# Patient Record
Sex: Female | Born: 1965 | Race: Black or African American | Hispanic: No | Smoking: Former smoker
Health system: Southern US, Community
[De-identification: ages and names within clinical notes are randomized; demographics above are authoritative.]

## PROBLEM LIST (undated history)

## (undated) DIAGNOSIS — D649 Anemia, unspecified: Secondary | ICD-10-CM

## (undated) DIAGNOSIS — I10 Essential (primary) hypertension: Secondary | ICD-10-CM

## (undated) DIAGNOSIS — M1990 Unspecified osteoarthritis, unspecified site: Secondary | ICD-10-CM

## (undated) DIAGNOSIS — R7611 Nonspecific reaction to tuberculin skin test without active tuberculosis: Secondary | ICD-10-CM

## (undated) DIAGNOSIS — R011 Cardiac murmur, unspecified: Secondary | ICD-10-CM

## (undated) DIAGNOSIS — T7840XA Allergy, unspecified, initial encounter: Secondary | ICD-10-CM

## (undated) DIAGNOSIS — F17200 Nicotine dependence, unspecified, uncomplicated: Secondary | ICD-10-CM

## (undated) DIAGNOSIS — B029 Zoster without complications: Secondary | ICD-10-CM

## (undated) DIAGNOSIS — K219 Gastro-esophageal reflux disease without esophagitis: Secondary | ICD-10-CM

## (undated) HISTORY — DX: Anemia, unspecified: D64.9

## (undated) HISTORY — DX: Gastro-esophageal reflux disease without esophagitis: K21.9

## (undated) HISTORY — PX: UPPER GASTROINTESTINAL ENDOSCOPY: SHX188

## (undated) HISTORY — PX: OTHER SURGICAL HISTORY: SHX169

## (undated) HISTORY — PX: KNEE SURGERY: SHX244

## (undated) HISTORY — PX: ABDOMINAL HYSTERECTOMY: SHX81

## (undated) HISTORY — PX: TUBAL LIGATION: SHX77

## (undated) HISTORY — DX: Nicotine dependence, unspecified, uncomplicated: F17.200

## (undated) HISTORY — DX: Allergy, unspecified, initial encounter: T78.40XA

## (undated) HISTORY — DX: Cardiac murmur, unspecified: R01.1

## (undated) HISTORY — PX: PANENDOSCOPY: SHX2159

## (undated) HISTORY — PX: BUNIONECTOMY: SHX129

## (undated) HISTORY — DX: Essential (primary) hypertension: I10

## (undated) HISTORY — DX: Unspecified osteoarthritis, unspecified site: M19.90

## (undated) HISTORY — PX: BREAST SURGERY: SHX581

## (undated) HISTORY — PX: TONSILLECTOMY: SUR1361

## (undated) HISTORY — DX: Nonspecific reaction to tuberculin skin test without active tuberculosis: R76.11

## (undated) HISTORY — PX: CYSTECTOMY: SUR359

## (undated) HISTORY — PX: CHOLECYSTECTOMY: SHX55

---

## 1999-01-29 ENCOUNTER — Ambulatory Visit (HOSPITAL_BASED_OUTPATIENT_CLINIC_OR_DEPARTMENT_OTHER): Admission: RE | Admit: 1999-01-29 | Discharge: 1999-01-29 | Payer: Self-pay | Admitting: Orthopedic Surgery

## 1999-09-05 ENCOUNTER — Ambulatory Visit (HOSPITAL_COMMUNITY): Admission: RE | Admit: 1999-09-05 | Discharge: 1999-09-05 | Payer: Self-pay | Admitting: Internal Medicine

## 1999-09-05 ENCOUNTER — Encounter: Payer: Self-pay | Admitting: Internal Medicine

## 1999-09-06 ENCOUNTER — Encounter: Payer: Self-pay | Admitting: General Surgery

## 1999-09-06 ENCOUNTER — Encounter: Admission: RE | Admit: 1999-09-06 | Discharge: 1999-09-06 | Payer: Self-pay | Admitting: General Surgery

## 1999-09-13 ENCOUNTER — Observation Stay (HOSPITAL_COMMUNITY): Admission: RE | Admit: 1999-09-13 | Discharge: 1999-09-14 | Payer: Self-pay | Admitting: General Surgery

## 1999-09-13 ENCOUNTER — Encounter (INDEPENDENT_AMBULATORY_CARE_PROVIDER_SITE_OTHER): Payer: Self-pay | Admitting: Specialist

## 1999-10-12 ENCOUNTER — Encounter: Payer: Self-pay | Admitting: General Surgery

## 1999-10-12 ENCOUNTER — Ambulatory Visit (HOSPITAL_COMMUNITY): Admission: RE | Admit: 1999-10-12 | Discharge: 1999-10-12 | Payer: Self-pay | Admitting: General Surgery

## 1999-11-21 ENCOUNTER — Encounter: Payer: Self-pay | Admitting: Obstetrics & Gynecology

## 1999-11-21 ENCOUNTER — Encounter: Admission: RE | Admit: 1999-11-21 | Discharge: 1999-11-21 | Payer: Self-pay | Admitting: Obstetrics & Gynecology

## 1999-11-28 ENCOUNTER — Encounter (INDEPENDENT_AMBULATORY_CARE_PROVIDER_SITE_OTHER): Payer: Self-pay

## 1999-11-28 ENCOUNTER — Ambulatory Visit (HOSPITAL_COMMUNITY): Admission: RE | Admit: 1999-11-28 | Discharge: 1999-11-28 | Payer: Self-pay | Admitting: Obstetrics & Gynecology

## 2001-10-16 ENCOUNTER — Encounter: Admission: RE | Admit: 2001-10-16 | Discharge: 2001-10-16 | Payer: Self-pay | Admitting: *Deleted

## 2001-12-02 ENCOUNTER — Inpatient Hospital Stay (HOSPITAL_COMMUNITY): Admission: AD | Admit: 2001-12-02 | Discharge: 2001-12-02 | Payer: Self-pay | Admitting: Obstetrics and Gynecology

## 2001-12-10 ENCOUNTER — Encounter: Admission: RE | Admit: 2001-12-10 | Discharge: 2001-12-10 | Payer: Self-pay | Admitting: *Deleted

## 2002-04-19 ENCOUNTER — Other Ambulatory Visit: Admission: RE | Admit: 2002-04-19 | Discharge: 2002-04-19 | Payer: Self-pay | Admitting: Internal Medicine

## 2002-05-11 ENCOUNTER — Ambulatory Visit (HOSPITAL_COMMUNITY): Admission: RE | Admit: 2002-05-11 | Discharge: 2002-05-11 | Payer: Self-pay | Admitting: Obstetrics & Gynecology

## 2002-06-23 ENCOUNTER — Encounter (INDEPENDENT_AMBULATORY_CARE_PROVIDER_SITE_OTHER): Payer: Self-pay | Admitting: Specialist

## 2002-06-24 ENCOUNTER — Inpatient Hospital Stay (HOSPITAL_COMMUNITY): Admission: RE | Admit: 2002-06-24 | Discharge: 2002-06-25 | Payer: Self-pay | Admitting: Obstetrics & Gynecology

## 2002-09-21 ENCOUNTER — Ambulatory Visit (HOSPITAL_COMMUNITY): Admission: RE | Admit: 2002-09-21 | Discharge: 2002-09-21 | Payer: Self-pay | Admitting: Gastroenterology

## 2002-09-21 ENCOUNTER — Encounter: Payer: Self-pay | Admitting: Gastroenterology

## 2003-07-11 ENCOUNTER — Emergency Department (HOSPITAL_COMMUNITY): Admission: EM | Admit: 2003-07-11 | Discharge: 2003-07-11 | Payer: Self-pay | Admitting: Emergency Medicine

## 2004-02-24 ENCOUNTER — Encounter: Admission: RE | Admit: 2004-02-24 | Discharge: 2004-02-24 | Payer: Self-pay | Admitting: Internal Medicine

## 2004-06-15 ENCOUNTER — Encounter: Admission: RE | Admit: 2004-06-15 | Discharge: 2004-06-15 | Payer: Self-pay | Admitting: Internal Medicine

## 2004-08-19 ENCOUNTER — Emergency Department (HOSPITAL_COMMUNITY): Admission: EM | Admit: 2004-08-19 | Discharge: 2004-08-19 | Payer: Self-pay | Admitting: Family Medicine

## 2004-08-22 ENCOUNTER — Encounter: Admission: RE | Admit: 2004-08-22 | Discharge: 2004-08-22 | Payer: Self-pay | Admitting: Internal Medicine

## 2005-02-03 ENCOUNTER — Emergency Department (HOSPITAL_COMMUNITY): Admission: EM | Admit: 2005-02-03 | Discharge: 2005-02-03 | Payer: Self-pay | Admitting: Family Medicine

## 2005-03-18 ENCOUNTER — Encounter: Admission: RE | Admit: 2005-03-18 | Discharge: 2005-03-18 | Payer: Self-pay | Admitting: Internal Medicine

## 2005-03-18 ENCOUNTER — Ambulatory Visit: Payer: Self-pay | Admitting: Internal Medicine

## 2005-03-28 ENCOUNTER — Encounter: Admission: RE | Admit: 2005-03-28 | Discharge: 2005-03-28 | Payer: Self-pay | Admitting: Internal Medicine

## 2005-04-08 ENCOUNTER — Ambulatory Visit: Payer: Self-pay | Admitting: Internal Medicine

## 2006-03-29 ENCOUNTER — Emergency Department (HOSPITAL_COMMUNITY): Admission: EM | Admit: 2006-03-29 | Discharge: 2006-03-29 | Payer: Self-pay | Admitting: Family Medicine

## 2006-03-29 ENCOUNTER — Ambulatory Visit (HOSPITAL_COMMUNITY): Admission: RE | Admit: 2006-03-29 | Discharge: 2006-03-29 | Payer: Self-pay | Admitting: Family Medicine

## 2006-11-11 ENCOUNTER — Emergency Department (HOSPITAL_COMMUNITY): Admission: EM | Admit: 2006-11-11 | Discharge: 2006-11-11 | Payer: Self-pay | Admitting: Family Medicine

## 2006-11-14 ENCOUNTER — Ambulatory Visit: Payer: Self-pay | Admitting: Internal Medicine

## 2006-11-14 ENCOUNTER — Encounter: Admission: RE | Admit: 2006-11-14 | Discharge: 2006-11-14 | Payer: Self-pay | Admitting: Internal Medicine

## 2007-08-07 DIAGNOSIS — J309 Allergic rhinitis, unspecified: Secondary | ICD-10-CM | POA: Insufficient documentation

## 2007-08-11 ENCOUNTER — Ambulatory Visit: Payer: Self-pay | Admitting: Internal Medicine

## 2007-08-14 ENCOUNTER — Ambulatory Visit: Payer: Self-pay | Admitting: Family Medicine

## 2007-08-14 ENCOUNTER — Telehealth: Payer: Self-pay | Admitting: Family Medicine

## 2007-11-10 ENCOUNTER — Telehealth: Payer: Self-pay | Admitting: Internal Medicine

## 2008-01-29 ENCOUNTER — Telehealth: Payer: Self-pay | Admitting: Internal Medicine

## 2008-05-12 ENCOUNTER — Ambulatory Visit: Payer: Self-pay | Admitting: Internal Medicine

## 2008-05-12 DIAGNOSIS — N644 Mastodynia: Secondary | ICD-10-CM | POA: Insufficient documentation

## 2008-05-12 DIAGNOSIS — N951 Menopausal and female climacteric states: Secondary | ICD-10-CM | POA: Insufficient documentation

## 2008-05-12 DIAGNOSIS — K219 Gastro-esophageal reflux disease without esophagitis: Secondary | ICD-10-CM | POA: Insufficient documentation

## 2008-05-23 LAB — CONVERTED CEMR LAB
ALT: 11 units/L (ref 0–35)
AST: 18 units/L (ref 0–37)
Albumin: 4 g/dL (ref 3.5–5.2)
Alkaline Phosphatase: 41 units/L (ref 39–117)
Basophils Absolute: 0.1 10*3/uL (ref 0.0–0.1)
Basophils Relative: 1 % (ref 0.0–1.0)
Bilirubin, Direct: 0.1 mg/dL (ref 0.0–0.3)
Eosinophils Absolute: 0.2 10*3/uL (ref 0.0–0.7)
Eosinophils Relative: 3.8 % (ref 0.0–5.0)
Free T4: 0.8 ng/dL (ref 0.6–1.6)
HCT: 41.2 % (ref 36.0–46.0)
Hemoglobin: 14 g/dL (ref 12.0–15.0)
Lymphocytes Relative: 39.4 % (ref 12.0–46.0)
MCHC: 33.9 g/dL (ref 30.0–36.0)
MCV: 87.9 fL (ref 78.0–100.0)
Monocytes Absolute: 0.4 10*3/uL (ref 0.1–1.0)
Monocytes Relative: 6.7 % (ref 3.0–12.0)
Neutro Abs: 3 10*3/uL (ref 1.4–7.7)
Neutrophils Relative %: 49.1 % (ref 43.0–77.0)
Platelets: 269 10*3/uL (ref 150–400)
Prolactin: 15.3 ng/mL
RBC: 4.69 M/uL (ref 3.87–5.11)
RDW: 13.9 % (ref 11.5–14.6)
TSH: 1.06 microintl units/mL (ref 0.35–5.50)
Total Bilirubin: 0.7 mg/dL (ref 0.3–1.2)
Total Protein: 7.2 g/dL (ref 6.0–8.3)
WBC: 6.1 10*3/uL (ref 4.5–10.5)

## 2008-05-24 ENCOUNTER — Emergency Department (HOSPITAL_COMMUNITY): Admission: EM | Admit: 2008-05-24 | Discharge: 2008-05-24 | Payer: Self-pay | Admitting: Emergency Medicine

## 2009-03-22 ENCOUNTER — Ambulatory Visit: Payer: Self-pay | Admitting: Internal Medicine

## 2009-03-22 DIAGNOSIS — R0602 Shortness of breath: Secondary | ICD-10-CM | POA: Insufficient documentation

## 2009-03-22 DIAGNOSIS — F17200 Nicotine dependence, unspecified, uncomplicated: Secondary | ICD-10-CM

## 2009-03-22 HISTORY — DX: Nicotine dependence, unspecified, uncomplicated: F17.200

## 2009-03-31 ENCOUNTER — Ambulatory Visit: Payer: Self-pay | Admitting: Internal Medicine

## 2009-04-26 ENCOUNTER — Ambulatory Visit: Payer: Self-pay | Admitting: Internal Medicine

## 2009-04-26 DIAGNOSIS — N760 Acute vaginitis: Secondary | ICD-10-CM | POA: Insufficient documentation

## 2009-05-05 ENCOUNTER — Ambulatory Visit: Payer: Self-pay | Admitting: Internal Medicine

## 2009-05-28 ENCOUNTER — Emergency Department (HOSPITAL_COMMUNITY): Admission: EM | Admit: 2009-05-28 | Discharge: 2009-05-28 | Payer: Self-pay | Admitting: Emergency Medicine

## 2009-05-30 ENCOUNTER — Ambulatory Visit: Payer: Self-pay | Admitting: Internal Medicine

## 2009-05-30 DIAGNOSIS — R42 Dizziness and giddiness: Secondary | ICD-10-CM | POA: Insufficient documentation

## 2009-05-30 DIAGNOSIS — I10 Essential (primary) hypertension: Secondary | ICD-10-CM | POA: Insufficient documentation

## 2009-05-30 LAB — CONVERTED CEMR LAB
Anti Nuclear Antibody(ANA): NEGATIVE
Bilirubin Urine: NEGATIVE
Blood in Urine, dipstick: NEGATIVE
Glucose, Urine, Semiquant: NEGATIVE
Ketones, urine, test strip: NEGATIVE
Nitrite: NEGATIVE
Specific Gravity, Urine: 1.025
Urobilinogen, UA: 0.2
WBC Urine, dipstick: NEGATIVE
pH: 7

## 2009-06-05 LAB — CONVERTED CEMR LAB
ALT: 14 units/L (ref 0–35)
AST: 19 units/L (ref 0–37)
Albumin: 4.1 g/dL (ref 3.5–5.2)
Alkaline Phosphatase: 45 units/L (ref 39–117)
BUN: 13 mg/dL (ref 6–23)
Basophils Absolute: 0 10*3/uL (ref 0.0–0.1)
Basophils Relative: 0.2 % (ref 0.0–3.0)
Bilirubin, Direct: 0.1 mg/dL (ref 0.0–0.3)
CO2: 30 meq/L (ref 19–32)
Calcium: 8.9 mg/dL (ref 8.4–10.5)
Chloride: 103 meq/L (ref 96–112)
Cholesterol: 174 mg/dL (ref 0–200)
Creatinine, Ser: 0.8 mg/dL (ref 0.4–1.2)
Creatinine,U: 149.7 mg/dL
Eosinophils Absolute: 0.2 10*3/uL (ref 0.0–0.7)
Eosinophils Relative: 3.1 % (ref 0.0–5.0)
Free T4: 0.7 ng/dL (ref 0.6–1.6)
GFR calc non Af Amer: 100.98 mL/min (ref >60)
Glucose, Bld: 77 mg/dL (ref 70–99)
HCT: 40.1 % (ref 36.0–46.0)
HDL: 58.9 mg/dL (ref >39.00)
Hemoglobin: 13.7 g/dL (ref 12.0–15.0)
LDL Cholesterol: 99 mg/dL (ref 0–99)
Lymphocytes Relative: 39.7 % (ref 12.0–46.0)
Lymphs Abs: 3.1 10*3/uL (ref 0.7–4.0)
MCHC: 34.2 g/dL (ref 30.0–36.0)
MCV: 87.2 fL (ref 78.0–100.0)
Microalb Creat Ratio: 4.7 mg/g (ref 0.0–30.0)
Microalb, Ur: 0.7 mg/dL (ref 0.0–1.9)
Monocytes Absolute: 0.5 10*3/uL (ref 0.1–1.0)
Monocytes Relative: 6.3 % (ref 3.0–12.0)
Neutro Abs: 3.9 10*3/uL (ref 1.4–7.7)
Neutrophils Relative %: 50.7 % (ref 43.0–77.0)
Platelets: 241 10*3/uL (ref 150.0–400.0)
Potassium: 3.4 meq/L — ABNORMAL LOW (ref 3.5–5.1)
RBC: 4.6 M/uL (ref 3.87–5.11)
RDW: 14.4 % (ref 11.5–14.6)
Sed Rate: 7 mm/hr (ref 0–22)
Sodium: 139 meq/L (ref 135–145)
TSH: 1.56 microintl units/mL (ref 0.35–5.50)
Total Bilirubin: 0.6 mg/dL (ref 0.3–1.2)
Total CHOL/HDL Ratio: 3
Total Protein: 7.3 g/dL (ref 6.0–8.3)
Triglycerides: 81 mg/dL (ref 0.0–149.0)
VLDL: 16.2 mg/dL (ref 0.0–40.0)
WBC: 7.7 10*3/uL (ref 4.5–10.5)

## 2009-08-18 ENCOUNTER — Ambulatory Visit: Payer: Self-pay | Admitting: Internal Medicine

## 2009-09-15 ENCOUNTER — Emergency Department (HOSPITAL_COMMUNITY): Admission: EM | Admit: 2009-09-15 | Discharge: 2009-09-15 | Payer: Self-pay | Admitting: Emergency Medicine

## 2009-09-18 ENCOUNTER — Ambulatory Visit: Payer: Self-pay | Admitting: Internal Medicine

## 2009-09-18 DIAGNOSIS — R079 Chest pain, unspecified: Secondary | ICD-10-CM | POA: Insufficient documentation

## 2009-09-29 ENCOUNTER — Ambulatory Visit: Payer: Self-pay | Admitting: Internal Medicine

## 2009-09-29 DIAGNOSIS — J45909 Unspecified asthma, uncomplicated: Secondary | ICD-10-CM | POA: Insufficient documentation

## 2009-09-29 DIAGNOSIS — J45991 Cough variant asthma: Secondary | ICD-10-CM | POA: Insufficient documentation

## 2009-10-04 ENCOUNTER — Telehealth: Payer: Self-pay | Admitting: Internal Medicine

## 2010-03-02 ENCOUNTER — Ambulatory Visit: Payer: Self-pay | Admitting: Internal Medicine

## 2010-03-05 LAB — CONVERTED CEMR LAB
Chlamydia, Swab/Urine, PCR: NEGATIVE
GC Probe Amp, Urine: NEGATIVE

## 2010-03-31 ENCOUNTER — Emergency Department (HOSPITAL_COMMUNITY): Admission: EM | Admit: 2010-03-31 | Discharge: 2010-03-31 | Payer: Self-pay | Admitting: Emergency Medicine

## 2010-05-16 ENCOUNTER — Telehealth: Payer: Self-pay | Admitting: Internal Medicine

## 2010-05-17 ENCOUNTER — Ambulatory Visit: Payer: Self-pay | Admitting: Internal Medicine

## 2010-05-21 LAB — CONVERTED CEMR LAB

## 2010-07-06 ENCOUNTER — Telehealth: Payer: Self-pay | Admitting: *Deleted

## 2010-10-01 ENCOUNTER — Telehealth: Payer: Self-pay | Admitting: Internal Medicine

## 2010-10-01 ENCOUNTER — Ambulatory Visit: Payer: Self-pay | Admitting: Internal Medicine

## 2010-10-01 DIAGNOSIS — J029 Acute pharyngitis, unspecified: Secondary | ICD-10-CM | POA: Insufficient documentation

## 2010-10-01 LAB — CONVERTED CEMR LAB: Rapid Strep: NEGATIVE

## 2010-12-04 NOTE — Assessment & Plan Note (Signed)
Summary: rov/mm   Vital Signs:  Patient profile:   45 year old female Menstrual status:  hysterectomy Weight:      133 pounds O2 Sat:      98 % on Room air Pulse rate:   78 / minute BP sitting:   110 / 70  (right arm) Cuff size:   regular  Vitals Entered By: Romualdo Bolk, CMA (April 26, 2009 11:33 AM)  O2 Flow:  Room air CC: Pt has d/c symbicort which is working working well. Pt has a vaginal odor, no odor, no burning upon urination,  and no vaginal discharge. Pt has some FMLA forms that need to be filled out.   History of Present Illness: [name] comes in today  for follow up of multiple medical problems  Wheezing  poss asthma :   see last notes  Has been doing very well after prednisone and antibioitc    Stopped the symbicort   a week a go and doing ok  now. Still smoking but almost stoppig . Plans to stop. Has FMLA form for work because of missed work . ? Employer want s estimation of future missed work?? New problem  to be evaluated today.:   Funny odor to vagina  no discharge for 10 days ..  and lbp   No uti signs .  ( HAs had hysterectomy. NO fever  NVD .  ? like BV in the past.   Preventive Screening-Counseling & Management  Alcohol-Tobacco     Alcohol drinks/day: <1     Smoking Status: current     Smoking Cessation Counseling: YES     Smoke Cessation Stage: ready     Packs/Day: 0.25     Year Quit: 13 years      Passive Smoke Exposure: no  Caffeine-Diet-Exercise     Caffeine use/day: 2     Does Patient Exercise: no  Current Medications (verified): 1)  Nexium 40 Mg Cpdr (Esomeprazole Magnesium) .Marland Kitchen.. 1 Capsule By Mouth Once A Day. Pt Needs To Schedule A Follow Up Before Next Refill. 2)  Proair Hfa 108 (90 Base) Mcg/act Aers (Albuterol Sulfate) .... 2 Spray Every 4 Hours As Needed  Allergies (verified): 1)  Coly-Mycin S (Neomycin-Colistin-Hc) 2)  Tagamet Hb (Cimetidine)  Past History:  Past medical, surgical, family and social histories (including  risk factors) reviewed, and no changes noted (except as noted below).  Past Medical History: Reviewed history from 05/12/2008 and no changes required. 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 Cholecystectomy Hysterectomy has ovaries. Tubal ligation Panendoscopy  Family History: Reviewed history from 03/22/2009 and no changes required. MOM menopause  age 41 child has asthma and allergy  Social History: Reviewed history from 03/31/2009 and no changes required. Married Current Smoker Alcohol use-yes separated  employed  Public affairs consultant  Review of Systems  The patient denies anorexia, fever, weight loss, weight gain, dyspnea on exertion, peripheral edema, prolonged cough, headaches, abdominal pain, melena, hematochezia, severe indigestion/heartburn, hematuria, genital sores, unusual weight change, abnormal bleeding, enlarged lymph nodes, and angioedema.    Physical Exam  General:  Well-developed,well-nourished,in no acute distress; alert,appropriate and cooperative throughout examination Head:  normocephalic and atraumatic.   Mouth:  pharynx pink and moist.   Neck:  No deformities, masses, or tenderness noted. Lungs:  Normal respiratory effort, chest expands symmetrically. Lungs are clear to auscultation, no crackles or wheezes.no dullness.   Heart:  Normal rate and regular rhythm. S1 and S2 normal without gallop, murmur, click, rub or other extra sounds.no lifts.   Abdomen:  Bowel sounds positive,abdomen soft and non-tender without masses, organomegaly or  noted. Genitalia:  normal introitus, no external lesions, and mucosa pink and moist.  thick cottage cheese looking  discharge . cx absent.  Extremities:  no clubbing cyanosis or edema  Skin:  turgor normal, color normal, no ecchymoses, and no petechiae.   Cervical Nodes:  No lymphadenopathy noted Inguinal Nodes:  No  significant adenopathy Psych:  Normal eye contact, appropriate affect. Cognition appears normal.    Impression & Recommendations:  Problem # 1:  VAGINITIS (ICD-616.10) Assessment New Sounds like BV but  poss yeast  also from previous rx  .  emepiric treat for both  now . Her updated medication list for this problem includes:    Metronidazole 500 Mg Tabs (Metronidazole) .Marland Kitchen... 1 by mouth two times a day for 7 days  Problem # 2:  SHORTNESS OF BREATH (ICD-786.05) Assessment: Improved better   probable asthmatic bronchitis ? viral or environ  triggered?   resolved   Problem # 3:  TOBACCO USE (ICD-305.1)  Encouraged smoking cessation and discussed different methods for smoking cessation.  has quit before  Complete Medication List: 1)  Nexium 40 Mg Cpdr (Esomeprazole magnesium) .Marland Kitchen.. 1 capsule by mouth once a day. pt needs to schedule a follow up before next refill. 2)  Proair Hfa 108 (90 Base) Mcg/act Aers (Albuterol sulfate) .... 2 spray every 4 hours as needed 3)  Fluconazole 150 Mg Tabs (Fluconazole) .Marland Kitchen.. 1 by mouth x 1 caqn repeat in 3 days 4)  Metronidazole 500 Mg Tabs (Metronidazole) .Marland Kitchen.. 1 by mouth two times a day for 7 days  Patient Instructions: 1)  take medication 2)  follow up if resp problem returns . 3)  will call when fmla form done. 4)  stop smoking . Prescriptions: METRONIDAZOLE 500 MG TABS (METRONIDAZOLE) 1 by mouth two times a day for 7 days  #14 x 0   Entered and Authorized by:   [name] MD   Signed by:   [name] MD on 04/26/2009   Method used:   Electronically to        [facility]* (retail)       [address]       [address] [facility]       [place], [place]  [address]       Ph: [phone]       Fax: [phone]   RxID:   [id number] FLUCONAZOLE 150 MG TABS (FLUCONAZOLE) 1 by mouth x 1 caqn repeat in 3 days  #2 x 0   Entered and Authorized by:   [name] MD   Signed by:   [name] MD on 04/26/2009    Method used:   Electronically to        [facility]* (retail)       [address]       [address] [facility]       [place], [place]  [address]       Ph: [phone]       Fax: [phone]   RxID:   [id number]

## 2010-12-04 NOTE — Assessment & Plan Note (Signed)
Summary: COUGH, THROWING UP CHEST IS TIGHT/MHF   Vital Signs:  Patient profile:   45 year old female Menstrual status:  hysterectomy Height:      60 inches Weight:      137 pounds BMI:     26.85 O2 Sat:      98 % Temp:     97.7 degrees F Pulse rate:   77 / minute BP sitting:   150 / 100  (left arm) Cuff size:   regular  Vitals Entered By: Romualdo Bolk, CMA (Mar 22, 2009 3:21 PM) CC: Started on 5/15 with a cough. Pt states that she is now throwing up yellow  congestion and chest tightness that comes and goes. Pt is also having lower back pain but no burning upon urination. Pt has been in bed since 5/16/ LMP - Character: hyst 2002 Menarche (age onset): 15 years   days  Menstrual Status hysterectomy   History of Present Illness: [name] comes in acutely today for  for  acute sob  of 1 day and phegm and mucous   yellow  and  foaming.  Has never had this before but started outside . Onset of illness    since  May 15 th.    NHard to walk 20 yards outside and so coulnt breath. so  came to office.   Hoarse ticke in throat and ocass sneeze . NO fever known.    No hx of asthma but restarted smoking in the past years   Sudafed nighttime   q 3 hours for sleep x 1 NO that helpful. Was up all night . Coulnt work and hasnt been to work for 3 days . laid in bed all weekend?  no hx of blood clots and no leg symptoms and not on hormones.      Preventive Screening-Counseling & Management     Alcohol drinks/day: <1     Smoking Status: current     Smoking Cessation Counseling: YES     Packs/Day: 0.25     Year Quit: 13 years      Caffeine use/day: 2     Does Patient Exercise: no  Comments: restarted  tob  6 years ago.    Current Medications (verified): 1)  Nexium 40 Mg Cpdr (Esomeprazole Magnesium) .Marland Kitchen.. 1 Capsule By Mouth Once A Day. Pt Needs To Schedule A Follow Up Before Next Refill.  Allergies (verified): 1)  Coly-Mycin S (Neomycin-Colistin-Hc) 2)  Tagamet Hb (Cimetidine)   Past History:  Past medical, surgical, family and social histories (including risk factors) reviewed, and no changes noted (except as noted below).  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    Tubal ligation    Panendoscopy  Past History:  Care Management:    None Current  Family History:    Reviewed history from 05/12/2008 and no changes required:       MOM menopause  age 72       child has asthma and allergy  Social History:    Reviewed history from 05/12/2008 and no changes required:       Married       Current Smoker       Alcohol use-yes  separated     Packs/Day:  0.25    Caffeine use/day:  2    Does Patient Exercise:  no  Review of Systems  The patient denies anorexia, fever, weight loss, vision loss, hoarseness, chest pain, syncope, peripheral edema, hemoptysis, abdominal pain, muscle weakness, difficulty walking, abnormal bleeding, enlarged lymph nodes, and angioedema.         has hx of back pain not new no uti symptoms .  no pleuritis pain.just feels tight.  Physical Exam  General:  Well-developed,well-nourished,in mild acute distress; alert,appropriate and cooperative throughout examination  sob and breathless and hoarse  calms down with  slow breathing  Head:  normocephalic and atraumatic.   Eyes:  vision grossly intact, pupils equal, and pupils round.   Ears:  R ear normal and L ear normal.   Nose:  no external deformity and no external erythema.  congested  Mouth:  pharynx pink and moist.   Neck:  No deformities, masses, or tenderness noted. Lungs:  no intercostal retractions.  increased resp rate and breathless ocass coughing spasm color good   dec bs  no rales rhonchi    Heart:  Normal rate and regular rhythm.  S1 and S2 normal without gallop, murmur, click, rub or other extra sounds. Abdomen:  Bowel sounds positive,abdomen soft and non-tender without masses, organomegaly or noted. Pulses:  pulses intact without delay   Extremities:  no clubbing cyanosis or edema  Neurologic:  non focal  Skin:  turgor normal, color normal, and no ecchymoses.   Cervical Nodes:  No lymphadenopathy noted Psych:  anxious   with breathing difficulty    Impression & Recommendations:  Problem # 1:  SHORTNESS OF BREATH (ICD-786.05) sub acute onset and cw bronchospasm and cough       will rx as asthmatic bronchitis and follow up .   get c xray  to r/o pneumonia  no evidence of a CV cause on exam and hx.  Orders: Admin of Therapeutic Inj  intramuscular or subcutaneous ([address]) Albuterol Sulfate Sol 1mg  unit dose (U0454) T-2 View CXR, Same Day (71020.5TC) Depo- Medrol 80mg  (J1040) Prescription Created Electronically [phone])  Problem # 2:  TOBACCO USE (ICD-305.1)  stop smoking again   Orders: Tobacco use cessation intermediate 3-10 minutes ([address])  Complete Medication List: 1)  Nexium 40 Mg Cpdr (Esomeprazole magnesium) .Marland Kitchen.. 1 capsule by mouth once a day. pt needs to schedule a follow up before next refill. 2)  Proair Hfa 108 (90 Base) Mcg/act Aers (Albuterol sulfate) .... 2 spray every 4 hours as needed 3)  Prednisone 20 Mg Tabs (Prednisone) .... Take 2 by mouth once daily for 5 days  or asdirected  Patient Instructions: 1)  get chest x ray today  2)  begin prednisone tomorrow . 3)  nebulizer of albuterol as needed 4)  Please schedule a follow-up appointment in 1  weeks.  or as needed.  5)  Tobacco is very bad for your health and your loved ones ! You should stop smoking !  Prescriptions: PREDNISONE 20 MG TABS (PREDNISONE) take 2 by mouth once daily for 5 days  or asdirected  #20 x 0   Entered and Authorized by:   [name] MD   Signed by:   [name] MD on 03/22/2009   Method used:   Electronically  to        [facility] [phone]* (retail)       [phone]  [place], [place]  [address]       Ph: [phone]       Fax: [phone]   RxID:   [id number] PROAIR HFA 108 (90 BASE) MCG/ACT AERS (ALBUTEROL SULFATE) 2 spray every 4 hours as needed  #1 x 0   Entered and Authorized by:   [name] MD   Signed by:   [name] MD on 03/22/2009   Method used:   Electronically to        [facility] [phone]* (retail)       [address]       [place], [place]  [address]       Ph: [phone]       Fax: [phone]   RxID:   [id number]    Medication Administration  Injection # 1:    Medication: Depo- Medrol 80mg     Diagnosis: SHORTNESS OF BREATH (ICD-786.05)    Route: IM    Site: RUOQ gluteus    Exp Date: 06/05/2011    Lot #: [id number]    Mfr: Pharmacia    Comments: Gave 120mg     Patient tolerated injection without complications    Given by: Romualdo Bolk, CMA (Mar 22, 2009 4:19 PM)  Medication # 1:    Medication: Albuterol Sulfate Sol 1mg  unit dose    Diagnosis: SHORTNESS OF BREATH (ICD-786.05)    Dose: 3ml    Route: inhaled    Exp Date: 09/04/2010    Lot #: [id number]    Mfr: Nephron    Patient tolerated medication without complications    Given by: Romualdo Bolk, CMA (Mar 22, 2009 4:21 PM)  Orders Added: 1)  Admin of Therapeutic Inj  intramuscular or subcutaneous [[address]] 2)  Albuterol Sulfate Sol 1mg  unit dose [J7613] 3)  T-2 View CXR, Same Day [71020.5TC] 4)  Depo- Medrol 80mg  [J1040] 5)  Est. Patient Level IV [60109] 6)  Tobacco use cessation intermediate 3-10 minutes [[address]] 7)  Prescription Created Electronically [phone]

## 2010-12-04 NOTE — Progress Notes (Signed)
Summary: lab order needed  Phone Note Call from Patient Call back at Home Phone [phone]   Caller: Patient---live call Summary of Call: pt would like an order for HIV and STD labs. Initial call taken by: Warnell Forester,  May 16, 2010 8:19 AM  Follow-up for Phone Call        Spoke to pt- she is not having any signs of infections. Pt doesn't want to GC/Chlamydia since she had this done back in April. But she is starting with a new partner and would like to have any other HIV and STD testing that is available. Follow-up by: Romualdo Bolk, CMA Duncan Dull),  May 16, 2010 8:29 AM  Additional Follow-up for Phone Call Additional follow up Details #1::        would do HIV RPR,  dx screening sti Additional Follow-up by: [name] MD,  May 16, 2010 1:08 PM    Additional Follow-up for Phone Call Additional follow up Details #2::    lmoam to come at 8:00 am tomorrow, due to her job schedule.  Follow-up by: Warnell Forester,  May 16, 2010 1:52 PM

## 2010-12-04 NOTE — Assessment & Plan Note (Signed)
Summary: tb test/ok per cindy/njr   Nurse Visit    Prior Medications: NEXIUM 40 MG CPDR (ESOMEPRAZOLE MAGNESIUM) 1 capsule by mouth once a day PREDNISONE 20 MG TABS (PREDNISONE)  VICODIN ES 7.5-750 MG TABS (HYDROCODONE-ACETAMINOPHEN) Take 1 tablet by mouth every four to six  hours Current Allergies: COLY-MYCIN S (NEOMYCIN-COLISTIN-HC) TAGAMET HB (CIMETIDINE)   PPD Application    Vaccine Type: PPD    Site: right forearm    Mfr: Sanofi Pasteur    Dose: 0.1 ml    Route: ID    Given by: Alfred Levins, CMA    Exp. Date: 06/27/2009    Lot #: [id number]  PPD Results    Date of reading: 08/14/2007    Results: 5-46mm    Interpretation: positive   Orders Added: 1)  TB Skin Test Maurine.Goes    ]  Appended Document: tb test/ok per cindy/njr PPD is positive at 10 mm as above. She has no sx, no cough, no fever, etc. Will send her for CXR today, and will start her on INH 300 mg once daily for 12 months.

## 2010-12-04 NOTE — Assessment & Plan Note (Signed)
Summary: ?vaginal inf/njr   Vital Signs:  Patient profile:   45 year old female Menstrual status:  hysterectomy Height:      60 inches Weight:      140 pounds BMI:     27.44 Temp:     98.3 degrees F oral Pulse rate:   60 / minute BP sitting:   120 / 80  (right arm) Cuff size:   regular  Vitals Entered By: Romualdo Bolk, CMA Duncan Dull) (March 02, 2010 2:55 PM) CC: White vaginal discharge, funny smell, no itching, no burning , no blood in urine.  Started 2-3 days ago. Pt hasn't used anything for it.   History of Present Illness: [name] comesin for acute sda    for above  Began 3 days  ago.   No abd pain and urinary pain.   tried  medcated douching.   No antibiotic recent.y . no uti symptom .  new paartener  condoms  review of record show eval with pulmonary   in fall and was supposed to follow up and get   Still has some  reflux but not the cough issue    Only using proair  if needed  . Not using symbicort.   Never had appt for pfts as note remarks. Does continu to smoke  HT  doing ok on arb new med   Preventive Screening-Counseling & Management  Alcohol-Tobacco     Alcohol drinks/day: <1     Smoking Status: current     Smoking Cessation Counseling: YES     Smoke Cessation Stage: ready     Packs/Day: 0.25     Year Quit: 13 years      Passive Smoke Exposure: no  Caffeine-Diet-Exercise     Caffeine use/day: 2     Does Patient Exercise: no  Current Medications (verified): 1)  Nexium 40 Mg Cpdr (Esomeprazole Magnesium) .Marland Kitchen.. 1 Capsule By Mouth Once A Day. 2)  Proair Hfa 108 (90 Base) Mcg/act Aers (Albuterol Sulfate) .... 2 Spray Every 4 Hours As Needed 3)  Symbicort 80-4.5 Mcg/act Aero (Budesonide-Formoterol Fumarate) .... 2 Puffs Every 12 Hours As Needed 4)  Diovan Hct 160-12.5 Mg  Tabs (Valsartan-Hydrochlorothiazide) .... One By Mouth Daily  Allergies (verified): 1)  ! * Allegra 2)  Coly-Mycin S (Neomycin-Colist-Hc-Thonzonium) 3)  Tagamet Hb  (Cimetidine)  Past History:  Past medical, surgical, family and social histories (including risk factors) reviewed, and no changes noted (except as noted below).  Past Medical History: Reviewed history from 09/29/2009 and no changes required. 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 Cholecystectomy Hysterectomy has ovaries. Tubal ligation Panendoscopy  Past History:  Care Management: Pulmonary: [facility]  Family History: Reviewed history from 09/29/2009 and no changes required. MOM menopause  age 27 child has asthma and allergy ? ht  Prostate CA- Father Lung CA- PGF (was never a smoker)  Social History: Reviewed history from 09/29/2009 and no changes required. Divorced Current smoker on and off since age 89.  Currently smokes up to 1/4 ppd. Alcohol use-yes employed  [employer]  [employer]  Review of Systems  The patient denies anorexia, fever, weight loss, weight gain, vision loss, prolonged cough, abdominal pain, melena, hematochezia, hematuria, incontinence, muscle weakness, transient blindness, difficulty walking, depression, unusual weight change, abnormal bleeding, enlarged lymph nodes, and angioedema.  Physical Exam  General:  Well-developed,well-nourished,in no acute distress; alert,appropriate and cooperative throughout examination Neck:  No deformities, masses, or tenderness noted. Lungs:  Normal respiratory effort, chest expands symmetrically. Lungs are clear to auscultation, no crackles or wheezes. Heart:  Normal rate and regular rhythm. S1 and S2 normal without gallop, murmur, click, rub or other extra sounds. Abdomen:  Bowel sounds positive,abdomen soft and non-tender without masses, organomegaly or  noted. Genitalia:  normal introitus and no external lesions.  thick whoted  adherent discharge  ? some cream,   cx absent Pulses:  nl cap refill Extremities:  no clubbing cyanosis or edema  Axillary Nodes:  No palpable lymphadenopathy Psych:  Oriented X3, good eye contact, not anxious appearing, and not depressed appearing.     Impression & Recommendations:  Problem # 1:  VAGINITIS (ICD-616.10)  Orders: T-Chlamydia & GC Probe, Urine ([address]) Specimen Handling ([address])  Problem # 2:  GERD (ICD-530.81) ongoing?   never had  pfts   as rec   cough is better   still smoking off ace  Her updated medication list for this problem includes:    Nexium 40 Mg Cpdr (Esomeprazole magnesium) .Marland Kitchen... 1 capsule by mouth once a day.  Problem # 3:  HYPERTENSION (ICD-401.9)  Her updated medication list for this problem includes:    Diovan Hct 160-12.5 Mg Tabs (Valsartan-hydrochlorothiazide) ..... One by mouth daily  Problem # 4:  TOBACCO USE (ICD-305.1) rec stopping  pt aware  Problem # 5:  ? of ASTHMA, UNSPECIFIED, UNSPECIFIED STATUS (ICD-493.90) o signs   get pfts . Her updated medication list for this problem includes:    Proair Hfa 108 (90 Base) Mcg/act Aers (Albuterol sulfate) .Marland Kitchen... 2 spray every 4 hours as needed    Symbicort 80-4.5 Mcg/act Aero (Budesonide-formoterol fumarate) .Marland Kitchen... 2 puffs every 12 hours as needed  Complete Medication List: 1)  Nexium 40 Mg Cpdr (Esomeprazole magnesium) .Marland Kitchen.. 1 capsule by mouth once a day. 2)  Proair Hfa 108 (90 Base) Mcg/act Aers (Albuterol sulfate) .... 2 spray every 4 hours as needed 3)  Symbicort 80-4.5 Mcg/act Aero (Budesonide-formoterol fumarate) .... 2 puffs every 12 hours as needed 4)  Diovan Hct 160-12.5 Mg Tabs (Valsartan-hydrochlorothiazide) .... One by mouth daily 5)  Fluconazole 150 Mg Tabs (Fluconazole) .Marland Kitchen.. 1 by mouth  x 1 can repeat in 3 days  Patient Instructions: 1)  You will be informed of lab results when available.  2)  take rx for yeast   3)  will schedule PFTs for you  Prescriptions: FLUCONAZOLE  150 MG TABS (FLUCONAZOLE) 1 by mouth  x 1 can repeat in 3 days  #2 x 0   Entered and Authorized by:   [name] MD   Signed by:   [name] MD on 03/02/2010   Method used:   Electronically to        [facility]* (retail)       [address]       [address] [facility]       [place], [place]  [address]       Ph: [phone]       Fax: [phone]   RxID:   [id number]

## 2010-12-04 NOTE — Progress Notes (Signed)
Summary: NEEDS RESULTS FROM CHEST X RAY  Phone Note Call from Patient Call back at [phone]   Caller: Patient Call For: [name] Summary of Call: PATIENT NEEDS TO PICK UP A COPY OF THE RESULTS FROM CHEST XRAY Initial call taken by: Roselle Locus,  August 14, 2007 2:43 PM  Follow-up for Phone Call        not back yet Follow-up by: Nelwyn Salisbury MD,  August 14, 2007 5:11 PM  Additional Follow-up for Phone Call Additional follow up Details #1::        see results Additional Follow-up by: Nelwyn Salisbury MD,  August 17, 2007 8:30 AM

## 2010-12-04 NOTE — Assessment & Plan Note (Signed)
Summary: COUGH, CONGESTION // RS - OK PER SHANNON, CMA // RS   Vital Signs:  Patient profile:   45 year old female Menstrual status:  hysterectomy Weight:      149 pounds O2 Sat:      97 % on Room air Temp:     100.3 degrees F oral Pulse rate:   112 / minute BP sitting:   162 / 112  (right arm) Cuff size:   regular  Vitals Entered By: Romualdo Bolk, CMA (AAMA) (October 01, 2010 3:48 PM)  O2 Flow:  Room air  Serial Vital Signs/Assessments:  Time      Position  BP       Pulse  Resp  Temp     By           R Arm     148/100                        [name] MD           L Arm     140/100                        [name] MD  Comments: reg cuff sitting  By: [name] MD   CC: No voice, sore throat, glands swollen, head full, neck sore, some coughing, no congestion. This started on 11/25. Got sick after recieving the flu shot 3 weeks ago and thought she was better but then got worse.   History of Present Illness: [name] comes in today  for  work in  for acute illness onset one day of myalgias fevers chills source wrote losing voice with swollen glands. No vomiting or diarrhea. One of her children has been sick with a cough and sore throat. But they did not have a fever. No specific strep exposure.   no UTI symptoms no rashes no asthma flare. Used  advil for fever .  Preventive Screening-Counseling & Management  Alcohol-Tobacco     Alcohol drinks/day: <1     Smoking Status: current     Smoking Cessation Counseling: YES     Smoke Cessation Stage: ready     Packs/Day: 0.25     Year Quit: 13 years      Passive Smoke Exposure: no  Caffeine-Diet-Exercise     Caffeine use/day: 2     Does Patient Exercise: no  Current Medications (verified): 1)  Nexium 40 Mg Cpdr (Esomeprazole Magnesium) .Marland Kitchen.. 1 Capsule By Mouth Once A Day. 2)  Proair Hfa 108 (90 Base) Mcg/act Aers (Albuterol Sulfate) .... 2 Spray Every 4 Hours As Needed 3)  Symbicort 80-4.5 Mcg/act  Aero (Budesonide-Formoterol Fumarate) .... 2 Puffs Every 12 Hours As Needed 4)  Diovan Hct 160-12.5 Mg  Tabs (Valsartan-Hydrochlorothiazide) .... One By Mouth Daily  Allergies (verified): 1)  ! * Allegra 2)  Coly-Mycin S (Neomycin-Colist-Hc-Thonzonium) 3)  Tagamet Hb (Cimetidine)  Past History:  Past medical, surgical, family and social histories (including risk factors) reviewed, and no changes noted (except as noted below).  Past Medical History: Reviewed history from 09/29/2009 and no changes required. 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 Cholecystectomy Hysterectomy has ovaries. Tubal ligation Panendoscopy  Family History: Reviewed history from 09/29/2009 and no changes  required. MOM menopause  age 52 child has asthma and allergy ? ht  Prostate CA- Father Lung CA- PGF (was never a smoker)  Social History: Reviewed history from 09/29/2009 and no changes required. Divorced Current smoker on and off since age 67.  Currently smokes up to 1/4 ppd. Alcohol use-yes employed  [employer]  [employer]  Review of Systems       The patient complains of anorexia, fever, and hoarseness.  The patient denies vision loss, decreased hearing, chest pain, syncope, dyspnea on exertion, peripheral edema, prolonged cough, hemoptysis, abdominal pain, melena, hematochezia, severe indigestion/heartburn, muscle weakness, transient blindness, difficulty walking, abnormal bleeding, enlarged lymph nodes, and angioedema.    Physical Exam  General:  moderately ill in nad  verbal  and tired cognitionnl very harse  Head:  Normocephalic and atraumatic without obvious abnormalities. No apparent alopecia or balding. Eyes:  PERRL, EOMs full, conjunctiva clear  Ears:  R ear normal, L ear normal, and no external deformities.   Nose:  no  external deformity and no external erythema.   Mouth:  1+ red no lesion or edema  Neck:  extremely tender ac nodes  1+ enlarged  Lungs:  Normal respiratory effort, chest expands symmetrically. Lungs are clear to auscultation, no crackles or wheezes. Heart:  Normal rate and regular rhythm. S1 and S2 normal without gallop, murmur, click, rub or other extra sounds. Abdomen:  Bowel sounds positive,abdomen soft and non-tender without masses, organomegaly or hernias noted. Msk:  no joint swelling and no joint warmth.   Pulses:  nl cap refill  Extremities:  no clubbing cyanosis or edema  Neurologic:  non focal  Skin:  no acute rashes no ecchymoses and no petechiae.   Cervical Nodes:  see nex exam  Psych:  nl cognition   Impression & Recommendations:  Problem # 1:  ACUTE PHARYNGITIS (ICD-462) with fever and malaise .. this could be viral sucha s adenovirus   but because of severity will do throat cx and  can rx in the meantime  pneding results .Marland KitchenMarland KitchenExpectant management and supportive acare otherwise. Her updated medication list for this problem includes:    Amoxicillin 500 Mg Caps (Amoxicillin) .Marland Kitchen... 1 by mouth two times a day  Orders: Rapid Strep (81191) Specimen Handling (47829) T-Culture, Throat (56213-08657)  Problem # 2:  FEVER (ICD-780.60) see above   Problem # 3:  HYPERTENSION (ICD-401.9) missed some doses   . take med and monitor  follow up if doesnt come down  Her updated medication list for this problem includes:    Diovan Hct 160-12.5 Mg Tabs (Valsartan-hydrochlorothiazide) ..... One by mouth daily  Complete Medication List: 1)  Nexium 40 Mg Cpdr (Esomeprazole magnesium) .Marland Kitchen.. 1 capsule by mouth once a day. 2)  Proair Hfa 108 (90 Base) Mcg/act Aers (Albuterol sulfate) .... 2 spray every 4 hours as needed 3)  Symbicort 80-4.5 Mcg/act Aero (Budesonide-formoterol fumarate) .... 2 puffs every 12 hours as needed 4)  Diovan Hct 160-12.5 Mg Tabs (Valsartan-hydrochlorothiazide) ....  One by mouth daily 5)  Amoxicillin 500 Mg Caps (Amoxicillin) .Marland Kitchen.. 1 by mouth two times a day  Patient Instructions: 1)  this may be viral but can start on antibiotic for poss strep because of the severity of  your sore throat   until we get the culture back 2)  Go back on your BP meds and increase fluids.  3)  no work until fever gone for 24 hours . 4)  call if fever not gone in 48  hours or as needed  Prescriptions: AMOXICILLIN 500 MG CAPS (AMOXICILLIN) 1 by mouth two times a day  #20 x 0   Entered and Authorized by:   [name] MD   Signed by:   [name] MD on 10/01/2010   Method used:   Electronically to        [facility]* (retail)       [address]       [address] Shipping/mailing       [place], [place]  [address]       Ph: [phone]       Fax: [phone]   RxID:   [id number]    Orders Added: 1)  Rapid Strep [84132] 2)  Specimen Handling [99000] 3)  T-Culture, Throat [44010-27253] 4)  Est. Patient Level IV [66440]    Laboratory Results  Date/Time Received: October 01, 2010 4:28 PM  Date/Time Reported: October 01, 2010 4:28 PM   Other Tests  Rapid Strep: negative Comments Wynona Canes, CMA  October 01, 2010 4:28 PM

## 2010-12-04 NOTE — Progress Notes (Signed)
Summary: REQ FOR RETURN CALL  Phone Note Call from Patient   Caller: Patient  [phone] Summary of Call: Pt called and adv that she was returning Omara Alcon's call   .... Can return call to her work # [phone]...........Marland Kitchen Adv she takes lunch 1-1:30pm and will be away from her desk at that time.  Initial call taken by: Debbra Riding,  July 06, 2010 11:36 AM  Follow-up for Phone Call        Pt aware that we are calling her daughter. She will have her daughter call us back. Follow-up by: Romualdo Bolk, CMA (AAMA),  July 06, 2010 11:42 AM

## 2010-12-04 NOTE — Assessment & Plan Note (Signed)
Summary: blood pressure elevated ok per shannon/mhf   Vital Signs:  Patient profile:   45 year old female Menstrual status:  hysterectomy Weight:      138 pounds Temp:     98.0 degrees F oral Pulse rate:   72 / minute BP sitting:   160 / 100  (right arm) Cuff size:   regular  Vitals Entered By: Romualdo Bolk, CMA (May 30, 2009 2:30 PM)  Serial Vital Signs/Assessments:  Time      Position  BP       Pulse  Resp  Temp     By                     168/98                         [name] MD  Comments: left arm sitting By: [name] MD   CC: BP high. Pt also wants to be tested for lupus., Hypertension Management   History of Present Illness: [name] comes in today for   elevated Bp  readings .  She   had some HA and dizziness and high bp readings .   140- 170  at  work and was seen in ED.  Had bmet and ua done . Thinks stress is related but no hx of HT.  No current  cp or sob vision changes .  Bp seems still up.  No new meds or otcs.  Is concerned about lupus because of family and others who have it .  NO joint swelling .  No unusual rashes  mouth ulcers .  Didi have episode dyspnea felt to be  asthmatic bronchitis. Still smiokes trying to stop.  Hypertension History:      She complains of headache, visual symptoms, and syncope, but denies chest pain, palpitations, dyspnea with exertion, orthopnea, PND, peripheral edema, and neurologic problems.  Pt has been having floaters. Pt got dizzy at work on 7/25 BP 146/111 and then 171/128, last time was 158/108.        Positive major cardiovascular risk factors include hypertension and current tobacco user.  Negative major cardiovascular risk factors include female age less than 66 years old.     Preventive Screening-Counseling & Management  Alcohol-Tobacco     Alcohol drinks/day: <1     Smoking Status: current     Smoking Cessation Counseling: YES     Smoke Cessation Stage: ready     Packs/Day: 0.25     Year  Quit: 13 years      Passive Smoke Exposure: no  Caffeine-Diet-Exercise     Caffeine use/day: 2     Does Patient Exercise: no  Current Medications (verified): 1)  Nexium 40 Mg Cpdr (Esomeprazole Magnesium) .Marland Kitchen.. 1 Capsule By Mouth Once A Day. 2)  Proair Hfa 108 (90 Base) Mcg/act Aers (Albuterol Sulfate) .... 2 Spray Every 4 Hours As Needed 3)  Symbicort 80-4.5 Mcg/act Aero (Budesonide-Formoterol Fumarate)  Allergies (verified): 1)  Coly-Mycin S (Neomycin-Colistin-Hc) 2)  Tagamet Hb (Cimetidine)  Past History:  Past medical, surgical, family and social histories (including risk factors) reviewed, and no changes noted (except as noted below).  Past Medical History: Reviewed history from 05/12/2008 and no changes required. 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 Cholecystectomy Hysterectomy has ovaries. Tubal ligation Panendoscopy  Family History: Reviewed history  from 03/22/2009 and no changes required. MOM menopause  age 10 child has asthma and allergy ? ht   Social History: Reviewed history from 04/26/2009 and no changes required. Married Current Smoker Alcohol use-yes separated  employed  Public affairs consultant  Review of Systems  The patient denies anorexia, fever, weight loss, weight gain, vision loss, decreased hearing, hoarseness, chest pain, syncope, dyspnea on exertion, peripheral edema, prolonged cough, hemoptysis, abdominal pain, melena, hematochezia, severe indigestion/heartburn, hematuria, incontinence, muscle weakness, transient blindness, difficulty walking, depression, unusual weight change, abnormal bleeding, enlarged lymph nodes, and angioedema.    Physical Exam  General:  Well-developed,well-nourished,in no acute distress; alert,appropriate and cooperative throughout examination Head:  normocephalic and atraumatic.   Eyes:  vision grossly intact,  pupils equal, pupils round, and pupils reactive to light.  eoms intact  Ears:  R ear normal, L ear normal, and no external deformities.   Nose:  no external deformity, no external erythema, and no nasal discharge.   Mouth:  pharynx pink and moist.   Neck:  No deformities, masses, or tenderness noted. Lungs:  Normal respiratory effort, chest expands symmetrically. Lungs are clear to auscultation, no crackles or wheezes.no dullness.   Heart:  Normal rate and regular rhythm. S1 and S2 normal without gallop, murmur, click, rub or other extra sounds.no lifts.   Abdomen:  Bowel sounds positive,abdomen soft and non-tender without masses, organomegaly or   noted. Msk:  no joint swelling and no joint warmth.   Pulses:  pulses intact without delay   Extremities:  no clubbing cyanosis or edema  Neurologic:  alert & oriented X3.   non  focal  Skin:  turgor normal, color normal, no suspicious lesions, no ecchymoses, and no petechiae.   Cervical Nodes:  No lymphadenopathy noted Psych:  Oriented X3, good eye contact, and not anxious appearing.   Additional Exam:  ED note reviewed   Impression & Recommendations:  Problem # 1:  HYPERTENSION (ICD-401.9) Assessment New  Her updated medication list for this problem includes:    Prinzide 10-12.5 Mg Tabs (Lisinopril-hydrochlorothiazide) .Marland Kitchen... 1 by mouth once daily   for elevated b;ood pressure . generic  ok  Orders: Venipuncture ([address]) TLB-BMP (Basic Metabolic Panel-BMET) (80048-METABOL) TLB-Hepatic/Liver Function Pnl (80076-HEPATIC) TLB-Lipid Panel (80061-LIPID) TLB-CBC Platelet - w/Differential (85025-CBCD) TLB-Microalbumin/Creat Ratio, Urine (82043-MALB) TLB-Sedimentation Rate (ESR) (85652-ESR) TLB-T4 (Thyrox), Free [phone]) UA Dipstick w/o Micro (automated)  (81003) TLB-TSH (Thyroid Stimulating Hormone) (84443-TSH) T-Antinuclear Antib (ANA) ([address]-82956)  Problem # 2:  DIZZINESS (ICD-780.4) non focal exam     Problem # 3:  CONCERN ABOUT  LUPUS disc limitations of blood testing  but   can do screening  labs   Problem # 4:  TOBACCO USE (ICD-305.1) rec dc   Complete Medication List: 1)  Nexium 40 Mg Cpdr (Esomeprazole magnesium) .Marland Kitchen.. 1 capsule by mouth once a day. 2)  Proair Hfa 108 (90 Base) Mcg/act Aers (Albuterol sulfate) .... 2 spray every 4 hours as needed 3)  Symbicort 80-4.5 Mcg/act Aero (Budesonide-formoterol fumarate) 4)  Prinzide 10-12.5 Mg Tabs (Lisinopril-hydrochlorothiazide) .Marland Kitchen.. 1 by mouth once daily   for elevated b;ood pressure . generic  ok  Hypertension Assessment/Plan:      The patient's hypertensive risk group is category B: At least one risk factor (excluding diabetes) with no target organ damage.  Today's blood pressure is 160/100.    Patient Instructions: 1)  get Bp cuff and get readings   2)  begin medication and rov in 3-4 weeks or as needed 3)  bring in cuff then  4)  You will be informed of lab results when available.    Prescriptions: PRINZIDE 10-12.5 MG TABS (LISINOPRIL-HYDROCHLOROTHIAZIDE) 1 by mouth once daily   for elevated B;ood pressure . generic  ok  #30 x 1   Entered and Authorized by:   [name] MD   Signed by:   [name] MD on 05/30/2009   Method used:   Electronically to        [facility]* (retail)       [address]       [address] [place] [address]       [place], [place]  [address]       Ph: [phone]       Fax: [phone]   RxID:   [id number]   Laboratory Results   Urine Tests    Routine Urinalysis   Color: yellow Appearance: Clear Glucose: negative   (Normal Range: Negative) Bilirubin: negative   (Normal Range: Negative) Ketone: negative   (Normal Range: Negative) Spec. Gravity: 1.025   (Normal Range: 1.003-1.035) Blood: negative   (Normal Range: Negative) pH: 7.0   (Normal Range: 5.0-8.0) Protein: trace   (Normal Range: Negative) Urobilinogen: 0.2   (Normal Range: 0-1) Nitrite: negative   (Normal Range: Negative)  Leukocyte Esterace: negative   (Normal Range: Negative)    Comments: Rita Ohara  May 30, 2009 3:44 PM

## 2010-12-04 NOTE — Progress Notes (Signed)
Summary: NEW RX  Phone Note Call from Patient Call back at Work Phone [phone]   Caller: Patient-LIVE CALL Call For: DR [facility] Summary of Call: PT WOULD LIKE A NEW RX FOR NEXIUM 40MG  CALLED TO [facility] ON [address]. SHE WAS ON SAMPLES. THEY WORKED WELL. Initial call taken by: Warnell Forester,  November 10, 2007 12:46 PM  Follow-up for Phone Call        ok x 1 but pt needs to schedule a follow up with Dr. Fabian Sharp. Sent rx thru EMR. Follow-up by: Romualdo Bolk, CMA,  November 10, 2007 2:18 PM    New/Updated Medications: NEXIUM 40 MG CPDR (ESOMEPRAZOLE MAGNESIUM) 1 capsule by mouth once a day. Pt needs to schedule a follow up before next refill.   Prescriptions: NEXIUM 40 MG CPDR (ESOMEPRAZOLE MAGNESIUM) 1 capsule by mouth once a day. Pt needs to schedule a follow up before next refill.  #30 x 0   Entered by:   Romualdo Bolk, CMA   Authorized by:   [name] MD   Signed by:   Romualdo Bolk, CMA on 11/10/2007   Method used:   Electronically sent to ...       [facility] [address] [phone]*       [address]       [place], [place]  [address]       Ph: [phone]       Fax: [phone]   RxID:   [id number]

## 2010-12-04 NOTE — Miscellaneous (Signed)
Summary: Orders Update  Clinical Lists Changes  Problems: Added new problem of POSITIVE PPD (ICD-795.5) Orders: Added new Referral order of Radiology Referral (Radiology) - Signed

## 2010-12-04 NOTE — Assessment & Plan Note (Signed)
Summary: breast pain/dm   Vital Signs:  Patient Profile:   45 Years Old Female Weight:      140 pounds Temp:     98.1 degrees F oral Pulse rate:   66 / minute BP sitting:   100 / 70  (left arm) Cuff size:   regular  Vitals Entered By: Romualdo Bolk, CMA (May 12, 2008 2:48 PM)                 Chief Complaint:  Multiple medical problems or concerns.  History of Present Illness: [name] is here for breast tenderness, hot sweats, mood swings and to discuss changing nexium to something else.  Feels like pregnant and lactating but no discharge  . Painful  comes and goes but is swollen.   Nipple sore .  Hot flushes  strong.  No weight change and no fever  GERD  on nexium but  insurance may not pay for it  has had symptom on h2 blockers?    Prior Medications Reviewed Using: Patient Recall  Updated Prior Medication List: NEXIUM 40 MG CPDR (ESOMEPRAZOLE MAGNESIUM) 1 capsule by mouth once a day. Pt needs to schedule a follow up before next refill. 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    Tubal ligation    Panendoscopy   Family History:    MOM menopause  age 71  Social History:    Married    Current Smoker    Alcohol use-yes    separated     Review of Systems  The patient denies anorexia, chest pain, dyspnea on exertion, abdominal pain, abnormal bleeding, enlarged lymph nodes, and breast masses.         no gi gu symptom otherwise   Physical Exam  General:     Well-developed,well-nourished,in no acute distress; alert,appropriate and cooperative throughout examination Head:     Normocephalic and atraumatic without obvious abnormalities. No apparent alopecia or balding. Ears:     R ear  normal and L ear normal.   Neck:     No deformities, masses, or tenderness noted. Breasts:     No mass, nodules, thickening,, bulging, retraction, inflamation, nipple discharge or skin changes noted. is subjectively tender  no redness  Lungs:     Normal respiratory effort, chest expands symmetrically. Lungs are clear to auscultation, no crackles or wheezes. Heart:     Normal rate and regular rhythm. S1 and S2 normal without gallop, murmur, click, rub or other extra sounds. Abdomen:     Bowel sounds positive,abdomen soft and non-tender without masses, organomegaly or hernias noted. Pulses:     intact Neurologic:     grossly normal  Skin:     turgor normal, color normal, no ecchymoses, and no petechiae.   Cervical Nodes:     no anterior cervical adenopathy and no posterior cervical adenopathy.   Axillary Nodes:     no R axillary adenopathy and no L axillary adenopathy.   Psych:     Oriented X3, normally interactive, and good eye contact.      Impression & Recommendations:  Problem # 1:  BREAST PAIN, BILATERAL (ICD-611.71) Assessment: New unclear etiology but seems hormonal by hx   had hystectomy  Orders: TLB-CBC Platelet - w/Differential (85025-CBCD) TLB-Hepatic/Liver Function Pnl (80076-HEPATIC) TLB-TSH (Thyroid Stimulating Hormone) (84443-TSH) TLB-Prolactin (84146-PROL) TLB-T4 (Thyrox), Free [phone]) Sedimentation Rate, non-automated (40981)   Problem # 2:  HOT FLASHES (ICD-627.2) see above Orders: TLB-CBC Platelet - w/Differential (85025-CBCD) TLB-Hepatic/Liver Function Pnl (80076-HEPATIC) TLB-TSH (Thyroid Stimulating Hormone) (84443-TSH) TLB-Prolactin (84146-PROL) TLB-T4 (Thyrox), Free (19147-WG9F) Sedimentation Rate, non-automated (62130)   Problem # 3:  GERD (ICD-530.81)  Her updated medication list for this problem includes:    Nexium 40 Mg Cpdr (Esomeprazole magnesium) .Marland Kitchen... 1 capsule by mouth once a day.   Her updated medication list for this problem  includes:    Nexium 40 Mg Cpdr (Esomeprazole magnesium) .Marland Kitchen... 1 capsule by mouth once a day. pt needs to schedule a follow up before next refill.   Complete Medication List: 1)  Nexium 40 Mg Cpdr (Esomeprazole magnesium) .Marland Kitchen.. 1 capsule by mouth once a day. pt needs to schedule a follow up before next refill. 2)  Isoniazid 300 Mg Tabs (Isoniazid) .... Once daily   Patient Instructions: 1)  will call you abouat lab results and any follow up  2)  try prilosec otc for now and ask insurance what is preferred med for her reflux  and we can try this .     ] Laboratory Results   Blood Tests     SED rate: 1  Comments: Milica Zimonjic  May 13, 2008 11:53 AM

## 2010-12-04 NOTE — Progress Notes (Signed)
Summary: new rx's  Phone Note Call from Patient Call back at Home Phone [phone]   Caller: Patient Call For: [name] Reason for Call: Privacy/Consent Authorization Summary of Call: pt needs new rx for nexium 40mg  and  pro-air call into [place] outpt pharmacy [phone] Initial call taken by: Heron Sabins,  October 04, 2009 9:25 AM  Follow-up for Phone Call        ok to refill nexium x 3  and proair x 1  Follow-up by: [name],  October 04, 2009 3:59 PM  Additional Follow-up for Phone Call Additional follow up Details #1::        Pt called to ck on RX for Nexium / Proair.... Adv that same was suppose to be sent to [facility]... Pt can be reached [phone] with any questions or concerns. Additional Follow-up by: Debbra Riding,  October 06, 2009 10:07 AM    Additional Follow-up for Phone Call Additional follow up Details #2::    Pt informed both RX sent to [facility],  [facility] was on pt chart and sent in error. Follow-up by: Sid Falcon LPN,  October 06, 2009 11:04 AM  Prescriptions: PROAIR HFA 108 (90 BASE) MCG/ACT AERS (ALBUTEROL SULFATE) 2 spray every 4 hours as needed  #1 x 0   Entered by:   Sid Falcon LPN   Authorized by:   [name]   Signed by:   Sid Falcon LPN on 40/08/2724   Method used:   Electronically to        [facility]* (retail)       [address]       [address] Shipping/mailing       [place], [place]  [address]       Ph: [phone]       Fax: [phone]   RxID:   [id number] NEXIUM 40 MG CPDR (ESOMEPRAZOLE MAGNESIUM) 1 capsule by mouth once a day.  #30 x 3   Entered by:   Sid Falcon LPN   Authorized by:   [name]   Signed by:   Sid Falcon LPN on 09/32/3557   Method used:   Electronically to        [facility]* (retail)       [address]       [address] [place] [address]mail[address]       [place], [place]  [address]  Ph: [phone]       Fax: [phone]   RxID:   [id number] PROAIR HFA 108 (90 BASE) MCG/ACT AERS (ALBUTEROL SULFATE) 2 spray every 4 hours as needed  #1 x 0   Entered by:   Sid Falcon LPN   Authorized by:   [name]   Signed by:   Sid Falcon LPN on 70/35/0093   Method used:   Electronically to        [facility] [phone]* (retail)       [address]       [place], [place]  [address]       Ph: [phone]       Fax: [phone]   RxID:   [id number] NEXIUM 40 MG CPDR (ESOMEPRAZOLE MAGNESIUM) 1 capsule by mouth once a day.  #30 x 3   Entered by:   Sid Falcon LPN   Authorized  by:   [name]   Signed by:   Sid Falcon LPN on 91/47/8295   Method used:   Electronically to        [facility]. [phone]* (retail)       [address]       [place], [place]  [address]       Ph: [phone]       Fax: [phone]   RxID:   [id number]

## 2010-12-04 NOTE — Progress Notes (Signed)
Summary: Request for appt?  Phone Note Call from Patient   Caller: Patient Summary of Call: THIS PT IS BRINGING HER DAUGHTERS IN TODAY FOR SICK CHILD APPTS AT 3:45PM / 4:00PM..... PT IS EXP COUGH, CONGESTION AND WOULD LIKE TO BE SEEN AROUND THE SAME TIME.... CAN SHE BE PUT INTO THE 3:30PM APPT SLOT?   PT CAN BE REACHED AT [phone].  Lynford Humphrey,  RHONDA Initial call taken by: Debbra Riding,  October 01, 2010 9:33 AM  Follow-up for Phone Call        Per Dr. Fabian Sharp- okay to see with children today. Follow-up by: Romualdo Bolk, CMA Duncan Dull),  October 01, 2010 10:11 AM  Additional Follow-up for Phone Call Additional follow up Details #1::        Pt advised....put on schedule.  Additional Follow-up by: Debbra Riding,  October 01, 2010 12:15 PM

## 2010-12-04 NOTE — Assessment & Plan Note (Signed)
Summary: Pulmonary/ atypical  asthma ? ace ? cig related   Visit Type:  Initial Consult Copy to:  Dr. Berniece Andreas Primary Provider/Referring Provider:  Dr. Berniece Andreas   CC:  Asthma.  History of Present Illness: 89 yobf nutritiion ambassador at [employer]  active  smoker new onset ? asthma started May 2010   September 29, 2009 cc attacks of sob assoc with loss of voice and cough with hoking sensationwent to ER rx with neb and got better then another spell 11/22 better after symbicort using it as needed but not needed anything last several days prior to ov.  No noct or ear am excacerbations.  Pt also denies any obvious fluctuation in symptoms with weather or environmental change or other alleviating or aggravating factors.  Does have reflux but says under contol on nexium.   Pt presently enies any significant sore throat, dysphagia, itching, sneezing,  nasal congestion or excess secretions,  fever, chills, sweats, unintended wt loss, pleuritic or exertional cp, hempoptysis, change in activity tolerance between spells which typically only last minutes,  orthopnea pnd or leg swelling.  Current Medications (verified): 1)  Nexium 40 Mg Cpdr (Esomeprazole Magnesium) .Marland Kitchen.. 1 Capsule By Mouth Once A Day. 2)  Proair Hfa 108 (90 Base) Mcg/act Aers (Albuterol Sulfate) .... 2 Spray Every 4 Hours As Needed 3)  Symbicort 80-4.5 Mcg/act Aero (Budesonide-Formoterol Fumarate) .... 2 Puffs Every 12 Hours As Needed 4)  Prinzide 10-12.5 Mg Tabs (Lisinopril-Hydrochlorothiazide) .Marland Kitchen.. 1 By Mouth Once Daily   For Elevated B;ood Pressure . Generic  Ok  Allergies (verified): 1)  ! 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 MOM menopause  age 90  child has asthma and allergy ? ht  Prostate CA- Father Lung CA- PGF (was never a smoker)  Social History: Reviewed history from 04/26/2009 and no changes required. Divorced Current smoker on and off since age 48.  Currently smokes up to 1/4 ppd. 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  The patient denies shortness of breath at rest, productive cough, coughing up blood, chest pain, indigestion, weight change, abdominal pain, difficulty swallowing, sore throat, tooth/dental problems, headaches, itching, ear ache, anxiety, depression, hand/feet swelling, rash, change in color of mucus, and fever.    Vital Signs:  Patient profile:   45 year old female Menstrual status:  hysterectomy Weight:      141 pounds O2 Sat:      100 % on Room air Temp:     97.8 degrees F oral Pulse rate:   56 / minute BP sitting:   132 / 80  (left arm)  Vitals Entered By: Vernie Murders (September 29, 2009 11:20 AM)  O2 Flow:  Room air  Physical Exam  Additional Exam:  amb bf looked fine until asked to inhale hfa > severe upper airway cough HEENT: nl dentition, turbinates, and orophanx. Nl external ear canals without cough reflex NECK :  without JVD/Nodes/TM/ nl carotid upstrokes bilaterally LUNGS: no acc muscle use, clear to  A and P bilaterally without cough on insp or exp maneuvers CV:  RRR  no s3 or murmur or increase in P2, no edema  ABD:  soft and nontender with nl excursion in the supine position. No bruits or organomegaly, bowel sounds nl MS:  warm without deformities, calf tenderness, cyanosis or clubbing SKIN: warm and dry without lesions   NEURO:  alert, approp, no deficits     Impression & Recommendations:  Problem # 1:   ASTHMA (ICD-493.90) Very atypical features suggest upper airway cough syndrome, mistaken for COPD/asthma by even experienced pulmononologists.  Classic upper airway cough syndrome, so named because it's frequently impossible to sort out how much is LPR vs CR/sinusitis with freq throat clearing generating secondary extra esophageal GERD from wide swings in gastric pressure that occur with throat clearing, promoting self use of mint and menthol lozenges that reduce the lower esophageal sphincter tone and exacerbate the problem further These are the same pts who not infrequently have failed to tolerate ace inhibitors or report having reflux symptoms that don't respond to standard doses of PPI.  She is on ace and has reflux, so try diet and change to ARB x 6 weeks then ov with pft's  I spent extra time with the patient today explaining optimal mdi  technique.  This improved from  10-25% with coaching  Problem # 2:  HYPERTENSION (ICD-401.9)  The following medications were removed from the medication list:    Prinzide 10-12.5 Mg Tabs (Lisinopril-hydrochlorothiazide) .Marland Kitchen... 1 by mouth once daily   for elevated b;ood pressure . generic  ok Her updated medication list for this problem includes:    Diovan Hct 160-12.5 Mg Tabs (Valsartan-hydrochlorothiazide) ..... One by mouth daily  Orders: Consultation Level V ([address])  Medications Added to Medication List This Visit: 1)  Symbicort 80-4.5 Mcg/act Aero (Budesonide-formoterol fumarate) .... 2 puffs every 12 hours as needed 2)  Diovan Hct 160-12.5 Mg Tabs (Valsartan-hydrochlorothiazide) .... One by mouth daily  Patient Instructions: 1)  GERD (REFLUX)  is a common cause of respiratory symptoms. It commonly presents without heartburn and can be treated with medication, but also with lifestyle changes including avoidance of late meals, excessive alcohol, smoking cessation, and avoid fatty foods, chocolate, peppermint, colas, red wine, and acidic juices such  as orange juice. NO MINT OR MENTHOL PRODUCTS SO NO COUGH DROPS  2)  USE SUGARLESS CANDY INSTEAD (jolley ranchers)  3)  NO OIL BASED VITAMINS  4)  Stop prinizide and replace with diovan 160/12.5  5)  if doing fine, no symbiocort, if not, go ahead and use symbicort 80 2 puffs first thing  in am and 2 puffs again in pm about 12 hours later and ok to use proaire in between if needed 6)  Please schedule a follow-up appointment in 6 weeks with PFT's

## 2011-01-07 ENCOUNTER — Encounter: Payer: Self-pay | Admitting: Internal Medicine

## 2011-01-07 ENCOUNTER — Ambulatory Visit (INDEPENDENT_AMBULATORY_CARE_PROVIDER_SITE_OTHER): Payer: 59 | Admitting: Internal Medicine

## 2011-01-07 VITALS — BP 152/92 | HR 84 | Temp 98.5°F | Wt 154.0 lb

## 2011-01-07 DIAGNOSIS — F17200 Nicotine dependence, unspecified, uncomplicated: Secondary | ICD-10-CM

## 2011-01-07 DIAGNOSIS — R0602 Shortness of breath: Secondary | ICD-10-CM

## 2011-01-07 DIAGNOSIS — J45909 Unspecified asthma, uncomplicated: Secondary | ICD-10-CM

## 2011-01-07 DIAGNOSIS — I10 Essential (primary) hypertension: Secondary | ICD-10-CM

## 2011-01-07 MED ORDER — ALBUTEROL SULFATE (2.5 MG/3ML) 0.083% IN NEBU: 2.5000 mg | INHALATION_SOLUTION | RESPIRATORY_TRACT | Status: DC

## 2011-01-07 MED ORDER — METHYLPREDNISOLONE ACETATE 80 MG/ML IJ SUSP
120.0000 mg | Freq: Once | INTRAMUSCULAR | Status: AC
  Administered 2011-01-07: 120 mg via INTRAMUSCULAR

## 2011-01-07 MED ORDER — ALBUTEROL SULFATE (2.5 MG/3ML) 0.083% IN NEBU
2.5000 mg | INHALATION_SOLUTION | Freq: Once | RESPIRATORY_TRACT | Status: AC
  Administered 2011-01-07: 2.5 mg via RESPIRATORY_TRACT

## 2011-01-07 NOTE — Assessment & Plan Note (Signed)
A bit up today  No signs of cv problem .

## 2011-01-07 NOTE — Assessment & Plan Note (Signed)
Possible asthma diagnosis not confirmed Depo-Medrol given today next restart Azmacort followup this week with pulmonary.

## 2011-01-07 NOTE — Progress Notes (Signed)
  Subjective:    Patient ID: [name], female    DOB: 04/08/1966, 45 y.o.   MRN: [id number]  HPI  patient comes in today  Accompanied by her teen daughter for acute visit because of above complaint. A few days ago she had the acute onset of shortness of breath and wheezing in her upper chest that is calm and gone waxed and waned but never totally resolved she used her rescue inhaler with modest effect. She denies a significant cough at present and no fever or runny nose. One episode occurred after a laughing hard.   She has been able to sleep at night through this.   She stopped smoking February 8.   Review of Systems  chest pain except for upper chest tightness that she describes with her wheezing. No fever or weight loss swollen glands. Is on her reflux medicine Nexium. Taking her blood pressure medicine regularly.    Objective:   Physical Exam  well-developed well-nourished African American female in no acute distress although when speaking of her asthma she looks a little bit breathless. Her pulse ox is good and perfusion is normal.   No active wheezing heard. HEENT: Normocephalic ;atraumatic , Eyes;  PERRL, EOMs  Full, lids and conjunctiva clear,,Ears: no deformities, canals nl, TM landmarks normal, Nose: no deformity or discharge Minimal congestion  Mouth : OP clear without lesion or edema . Neck without masses Chest:  Clear to A&P without wheezes rales or rhonchi  Question decreased air movement. After albuterol nebulizer some increase air movement no wheezes or changes. CV:  S1-S2 no gallops or murmurs peripheral perfusion is normal   Blood pressure repeated and still slightly up 150 over 80s range  No clubbing cyanosis or edema   Neurologic:  Nonfocal. Psych:  Mildly anxious .   reveiwed  Pul visit   2010  .  In Centricity       Assessment & Plan:   shortness of breath possible asthma.    Unclear diagnosis but has a strong family history and onset which could be typical  today we'll give her IM Depo-Medrol she can restart her Symbicort and have pulmonary see her this week. Out of work for 2-3 days depending on status.   Hypertension: level up today she has gained a few pounds since stopping smoking we'll follow carefully Tobacco:   Congratulations on stopping remain smoke-free.

## 2011-01-07 NOTE — Assessment & Plan Note (Signed)
Stopped Dec 12 2010   Encouraged continued abstinence.

## 2011-01-07 NOTE — Assessment & Plan Note (Signed)
/   asthmatic  Vs  other   Need pulm follow up   Min help with neb today.  Has stopped tobacco oin the No major change in health status since last visit . month

## 2011-01-07 NOTE — Patient Instructions (Signed)
Rec  we get you to have follow up with pulmonary and need pfts to delineate the problem. Monitor your BP readings and ROV  in 2-3 weeks or as needed. Bring in BP machine also at that time.  Can restart the symbicort  In the meantime.

## 2011-01-08 ENCOUNTER — Encounter: Payer: Self-pay | Admitting: Internal Medicine

## 2011-01-08 ENCOUNTER — Ambulatory Visit (INDEPENDENT_AMBULATORY_CARE_PROVIDER_SITE_OTHER): Payer: 59 | Admitting: Internal Medicine

## 2011-01-08 ENCOUNTER — Ambulatory Visit (INDEPENDENT_AMBULATORY_CARE_PROVIDER_SITE_OTHER)
Admission: RE | Admit: 2011-01-08 | Discharge: 2011-01-08 | Disposition: A | Discharge status: Home / Self Care | Payer: 59 | Source: Ambulatory Visit | Attending: Internal Medicine | Admitting: Internal Medicine

## 2011-01-08 ENCOUNTER — Other Ambulatory Visit: Payer: Self-pay | Admitting: Internal Medicine

## 2011-01-08 DIAGNOSIS — R079 Chest pain, unspecified: Secondary | ICD-10-CM

## 2011-01-08 DIAGNOSIS — J45909 Unspecified asthma, uncomplicated: Secondary | ICD-10-CM

## 2011-01-09 ENCOUNTER — Encounter: Payer: Self-pay | Admitting: Internal Medicine

## 2011-01-15 NOTE — Assessment & Plan Note (Signed)
Summary: Pulmonary/ ext f/u eval > HFA 50% p coaching   Copy to:  Dr. Berniece Andreas Primary Provider/Referring Provider:  Dr. Berniece Andreas   CC:  Dyspnea and wheezing.  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 asthma started May 2010    September 29, 2009 cc attacks of sob assoc with loss of voice and cough with choking sensation went to ER rx with neb and got better then another spell 11/22 better after symbicort using it as needed but not needed anything last several days prior to ov.   Does have reflux but says under contol on nexium. rec gerd diet and f/u pft's > never done.  Reports quit smoking 12/12/10 and then cough got worse.  January 08, 2011 ov cc worse dyspnea, wheezing no cough x 2 weeks no better p  inalers, feels something stuck in ssn but swallowing ok. general chest tightness not better with saba    Pt denies any significant sore throat, dysphagia, itching, sneezing,  nasal congestion or excess secretions,  fever, chills, sweats, unintended wt loss, pleuritic or exertional cp, hempoptysis, change in activity tolerance  orthopnea pnd or leg swelling  worse spell after laughing p supper. assoc with hoarseness.  Pt also denies any obvious fluctuation in symptoms with weather or environmental change or other alleviating or aggravating factors.       Current Medications (verified): 1)  Nexium 40 Mg Cpdr (Esomeprazole Magnesium) .Marland Kitchen.. 1 Capsule By Mouth Once A Day. 2)  Proair Hfa 108 (90 Base) Mcg/act Aers (Albuterol Sulfate) .... 2 Spray Every 4 Hours As Needed 3)  Symbicort 80-4.5 Mcg/act Aero (Budesonide-Formoterol Fumarate) .... 2 Puffs Every 12 Hours 4)  Diovan Hct 160-12.5 Mg  Tabs (Valsartan-Hydrochlorothiazide) .... One By Mouth Daily  Allergies (verified): 1)  ! * Allegra 2)  ! 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 Quit 12/12/10. 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 Nl external ear canals without cough reflex NECK :  without JVD/Nodes/TM/ nl carotid upstrokes bilaterally LUNGS: no acc muscle use, clear to A and P bilaterally without cough on insp or exp maneuvers CV:  RRR  no s3 or murmur or increase in P2, no edema  ABD:  soft and nontender with nl excursion in the supine position. No bruits or organomegaly,  bowel sounds nl MS:  warm without deformities, calf tenderness, cyanosis or clubbing      CXR  Procedure date:  01/08/2011  Findings:      Comparison: None.  Findings: There is mild peribronchial thickening.  No consolidative process or effusion.  No pneumothorax.  Heart size upper normal.  IMPRESSION: Bronchitic change without focal process.   Impression & Recommendations:  Problem # 1:  ASTHMA (ICD-493.90) Atypical symptoms predominate here and are worse since quit smoking.    This raises red flags for me and I'm as skeptical about this as I would be hearing that a famous former [employer] driver accidently bumped the car ahead of him on the insterstate.  That is, if this was a  primary typical airway issue,  it should have been much more severe while smoking...    DDX of  difficult airways managment all start with A and  include Adherence, Ace Inhibitors, Acid Reflux, Active Sinus Disease, Alpha 1 Antitripsin deficiency, Anxiety masquerading as Airways dz,  ABPA,  allergy(esp in young), Aspiration (esp in elderly), Adverse effects of DPI,  Active smokers, plus two Bs  = Bronchiectasis and Beta blocker use..and one C= CHF    Adherence a major issue here.  I spent extra time with the patient today explaining optimal mdi  technique.  This improved from  25-50% with coaching.  Acid reflux:  reviewed ppi and diet  ?Active smoking : denies x 4 weeks  Problem # 2:  CHEST PAIN UNSPECIFIED (ICD-786.50) Chest tightness is probably related to GERD - no ekg evidence of ischemia while having chest tightness in office, no assoc nausea or vomiting, tends to occur x months to years with her other atypical symptoms so best explained by non-cardiac cp.  Medications Added to Medication List This Visit: 1)  Nexium 40 Mg Cpdr (Esomeprazole magnesium) .... Take  one 30-60 min before first meal of the day 2)  Symbicort 80-4.5 Mcg/act Aero (Budesonide-formoterol fumarate) .... 2 puffs every 12 hours 3)  Pepcid 20 Mg Tabs (Famotidine) .... Take one by mouth at bedtime  Other Orders: T-2 View CXR (71020TC) EKG w/ Interpretation (93000) Est. Patient Level IV (86578)  Patient Instructions: 1)  Work on inhaler technique:  relax and blow all the way out then take a nice smooth deep breath back in, triggering the inhaler at same time you start breathing in  2)  Symbicort 2 puffs first thing  in am and 2 puffs again in pm about 12 hours later  3)  Change nexium Take  one 30-60 min before first meal of the day and add pepcid 20 mg one at bedtime 4)  GERD (REFLUX)  is a common cause of respiratory symptoms. It commonly presents without heartburn and can be treated with medication, but also with  lifestyle changes including avoidance of late meals, excessive alcohol, smoking cessation, and avoid fatty foods, chocolate, peppermint, colas, red wine, and acidic juices such as orange juice. NO MINT OR MENTHOL PRODUCTS SO NO COUGH DROPS  5)  USE SUGARLESS CANDY INSTEAD (jolley ranchers)  6)  NO OIL BASED VITAMINS  7)  Please schedule a follow-up appointment in 6 weeks, sooner if needed for PFT's  8)

## 2011-01-21 LAB — POCT CARDIAC MARKERS
CKMB, poc: <1 ng/mL — ABNORMAL LOW (ref 1.0–8.0)
Myoglobin, poc: 32.7 ng/mL (ref 12–200)
Troponin i, poc: <0.05 ng/mL (ref 0.00–0.09)

## 2011-01-28 ENCOUNTER — Ambulatory Visit (INDEPENDENT_AMBULATORY_CARE_PROVIDER_SITE_OTHER): Payer: 59 | Admitting: Internal Medicine

## 2011-01-28 ENCOUNTER — Encounter: Payer: Self-pay | Admitting: Internal Medicine

## 2011-01-28 VITALS — BP 138/80 | HR 93 | Wt 147.0 lb

## 2011-01-28 DIAGNOSIS — F17200 Nicotine dependence, unspecified, uncomplicated: Secondary | ICD-10-CM

## 2011-01-28 DIAGNOSIS — I10 Essential (primary) hypertension: Secondary | ICD-10-CM

## 2011-01-28 DIAGNOSIS — J45909 Unspecified asthma, uncomplicated: Secondary | ICD-10-CM

## 2011-01-28 DIAGNOSIS — N760 Acute vaginitis: Secondary | ICD-10-CM

## 2011-01-28 MED ORDER — METRONIDAZOLE 500 MG PO TABS: 500.0000 mg | ORAL_TABLET | Freq: Two times a day (BID) | ORAL | Status: AC

## 2011-01-28 MED ORDER — VALSARTAN-HYDROCHLOROTHIAZIDE 160-12.5 MG PO TABS: 1.0000 | ORAL_TABLET | Freq: Every day | ORAL | Status: DC

## 2011-01-28 NOTE — Patient Instructions (Signed)
Take antibiotic for the vaginitis continue medication for BP  Continue tobacco cessation.

## 2011-01-30 ENCOUNTER — Encounter: Payer: Self-pay | Admitting: Internal Medicine

## 2011-01-30 NOTE — Assessment & Plan Note (Signed)
Encouraged tobacco cessation. 

## 2011-01-30 NOTE — Progress Notes (Signed)
  Subjective:    Patient ID: [name], female    DOB: 20-Jun-1966, 45 y.o.   MRN: [id number]  HPI Patient comes in today for followup of asthma elevated blood pressure and now has a vaginal discharge problem. Since her last visit her asthma is very much better she is taking inhalers she has seen  pulmonary and he is to have pulmonary function tests sitting. In regard to her blood pressure it appears to be better on medication she seems to be sleeping better and has fewer headaches. Her headaches were described as behind her eye pain. No increased coughing or edema. Blood pressure readings her in the 1:30 range she believes.  No significant side effect of medication.  Vaginal discharge over the last few days it has a foul odor. She's had a history of bacterial vaginal infection in the past no significant abdominal pain or UTI symptoms. She has had a hysterectomy. GERD symptoms Appeared to b stable.e  Past Medical History  Diagnosis Date  . Allergy   . Asthma   . GERD (gastroesophageal reflux disease)   . Hypertension   . Positive TB test    Past Surgical History  Procedure Date  . Cholecystectomy   . Abdominal hysterectomy   . Tubal ligation   . Panendoscopy   . Bunionectomy     both feet  . Cystectomy     left shoulder  . Breast surgery     cyst from left breast  . Uterine ablastion     and polyps removed    reports that she has quit smoking. She does not have any smokeless tobacco history on file. She reports that she drinks alcohol. She reports that she does not use illicit drugs. family history includes Alzheimer's disease in her mother; HIV in her brother; Hypertension in her father and mother; Lupus in her sister; and Prostate cancer in her father. Allergies  Allergen Reactions  . Cimetidine     REACTION: unspecified  . Citric Acid     REACTION: swelling, hives, itching  . Coly-Mycin S     REACTION: unspecified  . Fexofenadine     Review of Systems See  history of present illness no fever significant abdominal pain myalgias or swelling.     Objective:   Physical Exam Well-developed well-nourished in no acute distress she looks much more comfortable today.  HEENT: Normocephalic ;atraumatic , Eyes;  PERRL, EOMs  Full, lids and conjunctiva clear,,Ears: no deformities, canals nl, TM landmarks normal, Nose: no deformity or discharge  Mouth : OP clear without lesion or edema . Neck no masses Chest:  Clear to A&P without wheezes rales or rhonchi CV:  S1-S2 no gallops or murmurs peripheral perfusion is normal  repeat blood pressure 139/80 right arm sitting regular cuff.        Assessment & Plan:    Hypertension; improved on medication without significant side effects and patient feels better. Asthma; probable with possible GERD involved. Tobacco  she has not smoked since February a encourage tobacco cessation. Vaginitis; appears to be BV and she has had this before we'll do appear treatment and followup as needed.  We'll fill out FMLA form for now regarding her multiple visits necessitated by medical condition.

## 2011-02-06 LAB — CBC
HCT: 41.9 % (ref 36.0–46.0)
Hemoglobin: 13.9 g/dL (ref 12.0–15.0)
MCHC: 33.2 g/dL (ref 30.0–36.0)
MCV: 88.7 fL (ref 78.0–100.0)
Platelets: 281 10*3/uL (ref 150–400)
RBC: 4.72 MIL/uL (ref 3.87–5.11)
RDW: 14 % (ref 11.5–15.5)
WBC: 4.9 10*3/uL (ref 4.0–10.5)

## 2011-02-06 LAB — BASIC METABOLIC PANEL
BUN: 9 mg/dL (ref 6–23)
CO2: 24 mEq/L (ref 19–32)
Calcium: 9.5 mg/dL (ref 8.4–10.5)
Chloride: 106 mEq/L (ref 96–112)
Creatinine, Ser: 0.85 mg/dL (ref 0.4–1.2)
GFR calc Af Amer: >60 mL/min (ref >60)
GFR calc non Af Amer: >60 mL/min (ref >60)
Glucose, Bld: 101 mg/dL — ABNORMAL HIGH (ref 70–99)
Potassium: 3.2 mEq/L — ABNORMAL LOW (ref 3.5–5.1)
Sodium: 140 mEq/L (ref 135–145)

## 2011-02-06 LAB — DIFFERENTIAL
Basophils Absolute: 0 10*3/uL (ref 0.0–0.1)
Basophils Relative: 1 % (ref 0–1)
Eosinophils Absolute: 0.3 10*3/uL (ref 0.0–0.7)
Eosinophils Relative: 6 % — ABNORMAL HIGH (ref 0–5)
Lymphocytes Relative: 31 % (ref 12–46)
Lymphs Abs: 1.5 10*3/uL (ref 0.7–4.0)
Monocytes Absolute: 0.4 10*3/uL (ref 0.1–1.0)
Monocytes Relative: 8 % (ref 3–12)
Neutro Abs: 2.7 10*3/uL (ref 1.7–7.7)
Neutrophils Relative %: 55 % (ref 43–77)

## 2011-02-06 LAB — POCT CARDIAC MARKERS
CKMB, poc: <1 ng/mL — ABNORMAL LOW (ref 1.0–8.0)
CKMB, poc: <1 ng/mL — ABNORMAL LOW (ref 1.0–8.0)
Myoglobin, poc: 36.1 ng/mL (ref 12–200)
Myoglobin, poc: 51 ng/mL (ref 12–200)
Troponin i, poc: <0.05 ng/mL (ref 0.00–0.09)
Troponin i, poc: <0.05 ng/mL (ref 0.00–0.09)

## 2011-02-10 LAB — POCT I-STAT, CHEM 8
BUN: 8 mg/dL (ref 6–23)
Calcium, Ion: 1.15 mmol/L (ref 1.12–1.32)
Chloride: 102 mEq/L (ref 96–112)
Creatinine, Ser: 0.8 mg/dL (ref 0.4–1.2)
Glucose, Bld: 79 mg/dL (ref 70–99)
HCT: 40 % (ref 36.0–46.0)
Hemoglobin: 13.6 g/dL (ref 12.0–15.0)
Potassium: 3.6 mEq/L (ref 3.5–5.1)
Sodium: 137 mEq/L (ref 135–145)
TCO2: 24 mmol/L (ref 0–100)

## 2011-02-10 LAB — URINALYSIS, ROUTINE W REFLEX MICROSCOPIC
Bilirubin Urine: NEGATIVE
Glucose, UA: NEGATIVE mg/dL
Hgb urine dipstick: NEGATIVE
Ketones, ur: NEGATIVE mg/dL
Nitrite: NEGATIVE
Protein, ur: NEGATIVE mg/dL
Specific Gravity, Urine: 1.012 (ref 1.005–1.030)
Urobilinogen, UA: 0.2 mg/dL (ref 0.0–1.0)
pH: 7 (ref 5.0–8.0)

## 2011-02-19 ENCOUNTER — Encounter: Payer: Self-pay | Admitting: Internal Medicine

## 2011-02-20 ENCOUNTER — Ambulatory Visit (INDEPENDENT_AMBULATORY_CARE_PROVIDER_SITE_OTHER): Payer: 59 | Admitting: Internal Medicine

## 2011-02-20 ENCOUNTER — Encounter: Payer: Self-pay | Admitting: Internal Medicine

## 2011-02-20 DIAGNOSIS — F17200 Nicotine dependence, unspecified, uncomplicated: Secondary | ICD-10-CM

## 2011-02-20 DIAGNOSIS — J45909 Unspecified asthma, uncomplicated: Secondary | ICD-10-CM

## 2011-02-20 DIAGNOSIS — K219 Gastro-esophageal reflux disease without esophagitis: Secondary | ICD-10-CM

## 2011-02-20 DIAGNOSIS — R0602 Shortness of breath: Secondary | ICD-10-CM

## 2011-02-20 LAB — PULMONARY FUNCTION TEST

## 2011-02-20 NOTE — Progress Notes (Signed)
Subjective:    Patient ID: [name], female    DOB: [birth year], 45 y.o.   MRN: [id number]  HPI  37 yobf pt accounting rep for [employer] h/o smoking new onset ? asthma started May 2010   September 29, 2009 cc attacks of sob assoc with loss of voice and cough with choking sensation went to ER rx with neb and got better then another spell 11/22 better after symbicort using it as needed but not needed anything last several days prior to ov. Does have reflux but says under contol on nexium. rec gerd diet and f/u pft's > never done.  Reports quit smoking 12/12/10 and then cough got worse.   January 08, 2011 ov cc worse dyspnea, wheezing no cough x 2 weeks no better p inalers, feels something stuck in ssn but swallowing ok. general chest tightness not better with saba.  Patient Instructions:  1) Work on inhaler technique: relax and blow all the way out then take a nice smooth deep breath back in, triggering the inhaler at same time you start breathing in  2) Symbicort 2 puffs first thing in am and 2 puffs again in pm about 12 hours later  3) Change nexium Take one 30-60 min before first meal of the day and add pepcid 20 mg one at bedtime  4) GERD (REFLUX)   02/20/2011 ov / Sherene Sires   Cc breathing some better on symbicort but still having episodes of chest tightness misn to an hour once or twice weekly.  Overall much better vs baseline, no noct symptoms or need for saba, much less sense of something stuck in back of throat, no cough.    Pt denies any significant sore throat, dysphagia, itching, sneezing,  nasal congestion or excess/ purulent secretions,  fever, chills, sweats, unintended wt loss, pleuritic or exertional cp, hempoptysis, orthopnea pnd or leg swelling.    Also denies any obvious fluctuation of symptoms with weather or environmental changes or other aggravating or alleviating factors.     Allergies 1) ! * Allegra  2) ! 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 Quit 12/12/10. Smoked for approx 10 yrs up to 1/4 ppd  Alcohol use-yes  employed [employer]              Review of Systems     Objective:   Physical Exam    amb bf nad  wt 148 > 152 January 08, 2011 > 149  02/20/11  HEENT: nl dentition, turbinates, and orophanx. Nl external ear canals without cough reflex  NECK : without JVD/Nodes/TM/ nl carotid upstrokes bilaterally  LUNGS: no acc muscle use, clear to A and P bilaterally without cough on insp or exp maneuvers  CV: RRR no s3 or murmur or increase in P2, no edema  ABD: soft and nontender with nl excursion in the supine position. No bruits or organomegaly, bowel sounds nl  MS: warm without deformities, calf tenderness, cyanosis or clubbing    CXR  Procedure date: 01/08/2011  Findings:  Comparison: None.  Findings: There is mild peribronchial thickening. No consolidative  process or effusion. No pneumothorax. Heart size upper normal.  IMPRESSION:  Bronchitic change without focal process.  Assessment & Plan:

## 2011-02-20 NOTE — Patient Instructions (Signed)
Your lung function is excellent and you do not have significant copd

## 2011-02-21 NOTE — Progress Notes (Signed)
PFT done today. 

## 2011-02-22 NOTE — Assessment & Plan Note (Signed)
Many of her symptoms are c/w   Classic Upper airway cough syndrome, so named because it's frequently impossible to sort out how much is  CR/sinusitis with freq throat clearing (which can be related to primary GERD)   vs  causing  secondary (" extra esophageal")  GERD from wide swings in gastric pressure that occur with throat clearing, often  promoting self use of mint and menthol lozenges that reduce the lower esophageal sphincter tone and exacerbate the problem further in a cyclical fashion.   These are the same pts who not infrequently have failed to tolerate ace inhibitors,  dry powder inhalers or biphosphonates or report having reflux symptoms that don't respond to standard doses of PPI , and are easily confused as having aecopd or asthma flares,   For now rec continue max gerd rx/ diet

## 2011-02-22 NOTE — Assessment & Plan Note (Signed)
I reviewed the Flethcher curve with patient that basically indicates  if you quit smoking when your best day FEV1 is still well preserved it is highly unlikely you will progress to severe disease and informed the patient there was no medication on the market that has proven to change the curve or the likelihood of progression.  Therefore stopping smoking and maintaining abstinence is the most important aspect of care, not choice of inhalers or for that matter, doctors.   

## 2011-02-22 NOTE — Assessment & Plan Note (Signed)
All goals of chronic asthma control met including optimal function and elimination of symptoms with minimal need for rescue therapy.  Contingencies discussed in full including contacting this office immediately if not controlling the symptoms using the rule of two's.    

## 2011-02-26 ENCOUNTER — Encounter: Payer: Self-pay | Admitting: Internal Medicine

## 2011-03-15 ENCOUNTER — Telehealth: Payer: Self-pay | Admitting: Internal Medicine

## 2011-03-15 NOTE — Telephone Encounter (Signed)
Pt req samples of Diovan HCT 160/12.5 mg, if available. Pls call pt asap today.

## 2011-03-15 NOTE — Telephone Encounter (Signed)
Per Dr. Fabian Sharp- okay to give samples. Pt aware that samples are ready to pick up.

## 2011-03-22 NOTE — Op Note (Signed)
[facility]  Patient:    [name], [name] Visit Number: [id number] MRN: [id number]          Service Type: DSU Location: [facility] Attending Physician:  Genia Del Dictated by:   Genia Del, M.D. Proc. Date: 05/11/02 Admit Date:  05/11/2002 Discharge Date: 05/11/2002                             Operative Report  DATE OF BIRTH:                11/08/1965  PREOPERATIVE DIAGNOSIS:       Menorrhagia.  POSTOPERATIVE DIAGNOSIS:      Menorrhagia.  OPERATION:                    Cryoablation of the endometrium.  SURGEON:                      Genia Del, M.D.  ASSISTANT:  ANESTHESIA:                   Vita Erm, Montez Hageman., M.D.  ESTIMATED BLOOD LOSS:  DESCRIPTION OF PROCEDURE:     Under general anesthesia with endotracheal intubation, the patient is in the lithotomy position.  She is prepped with Betadine in the suprapubic, vulva, and vaginal areas.  The bladder is catheterized and the patient is draped as usual.  The vagina examination reveals an anteverted uterus, normal volume and mobile, no adnexal mass.  The speculum is inserted.  The anterior lip of the cervix is grasped with a tenaculum.  Dilatation is done with Hegar dilators up to #23 without difficulty.  The hysterometry is 10 cm.  We proceeded with cryoablation starting on the right uterine side for a period of six minutes.  We then removed it and reapplied it on the left uterine side for eight minutes and then in the midline for four minutes.  Insertion and removal of the device is done without difficulty.  We then removed all instruments.  Hemostasis is adequate.  There was no blood loss and no complications. Dictated by:   Genia Del, M.D. Attending Physician:  Genia Del DD:  05/11/02 TD:  05/13/02 Job: [id number]

## 2011-03-22 NOTE — Op Note (Signed)
[name], [name]                         ACCOUNT NO.:  [id number]   MEDICAL RECORD NO.:  [id number]                   PATIENT TYPE:  OBV   LOCATION:  9313                                 FACILITY:  [facility]   PHYSICIAN:  Genia Del, M.D.             DATE OF BIRTH:  09-Aug-1966   DATE OF PROCEDURE:  06/23/2002  DATE OF DISCHARGE:                                 OPERATIVE REPORT   PREOPERATIVE DIAGNOSES:  1. Menorrhagia with severe dysmenorrhea.  2. Persisting post endometrial cryoablation.   POSTOPERATIVE DIAGNOSES:  1. Menorrhagia with severe dysmenorrhea.  2. Persisting post endometrial cryoablation.  3. Dense anterior uteroabdominal wall adhesions.   SURGEON:  Genia Del, M.D.   ASSISTANT:  Gerri Spore B. Earlene Plater, M.D.   ANESTHESIOLOGIST:  Raul Del, M.D.   INTERVENTION:  Laparoscopy-assisted vaginal hysterectomy with preservation  of adnexa and lysis of adhesions.   PROCEDURE:  Under general anesthesia, the patient is in lithotomy position  for operative laparoscopy.  She is prepped with Hibiclens on the abdominal,  suprapubic, vulvar, and vaginal areas.  The bladder catheter is inserted and  the patient is draped as usual.  The vaginal exam reveals an anteverted  uterus, normal volume, no adnexal mass.  The speculum is inserted then the  intrauterine cannula is inserted with an anterior tenaculum on the cervix.  We then removed the speculum abdominally and infraumbilical incision is made  with a scalpel over 1-2 cm.  We then dissect for an open laparoscopy.  The  aponeurosis and then the parietal peritoneum are open under direct vision.  We entered the abdominal cavity.  A 0 Vicryl baseball stitch is applied at  the aponeurosis and then the Adson is introduced.  We then entered with a  laparoscope.  A contraincision is made at the right and then the left  inguinal areas.  We verify for an avascular state.  A 5-mm trocar is  introduced at each location.   We then used a probe to inspect the  abdominopelvic cavity.  No pathology was found in the abdominal cavity.  The  appendix is seen.  A picture is taken.  The pelvic cavity reveals two normal  ovaries in size and appearance.  The tubes are post bilateral tubal  ligation.  No pathology otherwise.  The uterus is normal size.  Dense  adhesions are present at the level of the anterior uterus to the anterior  abdominal wall.  The patient had two previous cesarean sections.  We then  used the Metzenbaum scissors with monocurrent and released those adhesions  making to sure find a safe plane and descending the bladder downward.  This  is done relatively easily.  No complication occurred during that process.  We then used the tripolar with an atraumatic clamp and started on the left  side cauterizing and sectioning the left round ligament, the left tube, and  then the left utero-ovarian  ligament.  This was done after good  visualization of the left ureter which is in normal anatomic location.  We  came down, staying close to the uterus, and stopped just before the uterine  artery.  We proceeded exactly the same way on the right side.  The bladder  was then descended further with the Metzenbaum scissors.  Hemostasis was  adequate.  We removed the abdominal laparoscopic instruments, put a drape,  and repositioned the patient in full lithotomy position to pursue the  procedure vaginally.  Vaginally, the weighted speculum was inserted.  The  intrauterine cannula was removed.  The cervix was grasped with two  tenaculums.  We injected epinephrine with Xylocaine 1% all around the cervix  at the junction of the vaginal mucosa.  We then directed cervically incision  with a scalpel at the junction of the cervix and vaginal mucosa.  The  anterior peritoneum was opened and a retractor was inserted to remove the  bladder from the surgical field.  The posterior peritoneum was also opened  and a long narrow  weighted speculum inserted.  We then clamped the left  uterosacral ligament with a curved Haney clamp.  We sectioned with the Mayo  scissors and sutured with 0 Vicryl in a Haney stitch.  We kept that under  hemostat.  We proceeded exactly the same way on the right side.  We then  used ligature to complete the hysterectomy, staying close to the uterus on  each side, cauterizing the uterine arteries on each side, sectioning with  the Metzenbaum scissors and continuing until the junction where the  laparoscopic cauterization was done.  The uterus was removed in one piece  and sent to pathology.  Hemostasis was good at all pedicles.  The posterior  vaginal cuff was sutured with a locked running suture with 0 Vicryl.  We  then did a McCall stitch x1 at the uterosacral ligaments.  Then a suspensory  suture was applied on the left side, going at the anterior vaginal mucosa,  taking the left uterosacral ligament and then going to the posterior vaginal  mucosa.  This was attached.  We proceeded the same way on the right side.  We then closed the vaginal cuff with X stitches with 0 Vicryl in a vertical  manner.  The uterosacral ligaments were reapproximated on the midline and  closure of the cuff was finalized.  Hemostasis was adequate.  The  instruments vaginally were removed.  We went back to laparoscopy.  At that  time, we reinflated the abdominopelvic cavity with CO2.  We irrigated the  pelvic cavity with a Nezhat and suctioned.  Superficially, hemostasis was  completed at the vaginal cuff with the tripolar.  Hemostasis was then good  at all levels.  We, therefore, removed the instruments, evacuated the CO2,  removed the trocars under direct vision.  At the infraumbilical level, the 0  Vicryl baseball stitch was attached.  We infiltrated the subcutaneous tissue  with Marcaine 0.25% plain at all incisions, closed the infraumbilical incision with a subcuticular stitch of Monocryl 4-0.  Then we  reapproximated  the skin at the inguinal incisions with Ethibond.  The count of instruments  and sponges was complete x2.  The estimated blood loss was less than 200 cc.  There was no complication and the patient was transferred to recovery room  in good status.  Genia Del, M.D.    ML/MEDQ  D:  06/23/2002  T:  06/23/2002  Job:  [id number]

## 2011-03-22 NOTE — Op Note (Signed)
[facility]  Patient:    [name]                       MRN: [id number] Proc. Date: 11/28/99 Adm. Date:  [id number] Attending:  Genia Del                           Operative Report  PREOPERATIVE DIAGNOSIS:       Menorrhagia and endometrial polyps.  POSTOPERATIVE DIAGNOSIS:      Menorrhagia and endometrial polyps.  OPERATION:                    Hysteroscopy with polypectomy and dilatation and curettage.  SURGEON:                      Genia Del, M.D.  ASSISTANT:  ANESTHESIA:                   Belva Agee, M.D.  ESTIMATED BLOOD LOSS:  DESCRIPTION OF PROCEDURE:     Under general anesthesia with endotracheal mask, he patient is in the lithotomy position.  She is prepped with Betadine on the suprapubic, vulvar, and vaginal areas.  A bladder catheterization is done and the patient is draped as usual.  The vaginal examination under general anesthesia reveals the normal size uterus, anteverted, mobile, no adnexal mass.  The cervix is normal to palpation.  The speculum is inserted.  The anterior lip of the cervix is grasped with a Pozzi clamp.  Dilatation is done without difficulty with Hegar dilators up to #33.  Then the hysteroscope is inserted and visualization of the  uterine cavity is accomplished.  Pictures are taken.  Small polyps, two of them are seen on the anterior wall of the uterine cavity about midway.  The loop is used  with cutting power to remove those two polyps.  Then hemostasis is good.  The hysteroscope is removed.  Curettage is accomplished with a sharp curet and the specimens are sent altogether to pathology.  The cavity is then looked at again  with hysteroscope.  Hemostasis is good.  The instruments are removed.  The estimated blood loss was minimal.  Sponge, needle, and instrument counts were correct x 2.  No complications occurred and the patient is brought to the recovery room in good  status. DD:  11/28/99 TD:  11/28/99 Job: [id number]

## 2011-03-22 NOTE — Discharge Summary (Signed)
[name], [name]                         ACCOUNT NO.:  [id number]   MEDICAL RECORD NO.:  [id number]                   PATIENT TYPE:  INP   LOCATION:  9313                                 FACILITY:  [facility]   PHYSICIAN:  Genia Del, M.D.             DATE OF BIRTH:  [birth year]   DATE OF ADMISSION:  06/23/2002  DATE OF DISCHARGE:  06/25/2002                                 DISCHARGE SUMMARY   ADMISSION DIAGNOSES:  Persistent menorrhagia and severe dysmenorrhea post  cryo ablation of the endometrium.   DISCHARGE DIAGNOSES:  Persistent menorrhagia and severe dysmenorrhea post  cryo ablation of the endometrium, status post laparoscopic assisted vaginal  hysterectomy with preservation of adnexa on June 23, 2002.   HOSPITAL COURSE:  The patient is a 45 year old G2, P2 status post bilateral  tubal ligation who had persistent menorrhagia and severe dysmenorrhea post  endometrial cryo ablation.  In spite of __________ for possible  postoperative endometritis and Bextra as well as Aygestin, patient was in  severe pain with lower abdominal cramps and persistent vaginal bleeding post  ablation.   PAST SURGICAL HISTORY:  1. Bilateral tubal ligation.  2. Cesarean section x2.  3. Cryo ablation of endometrium in July 2002.  4. Cholecystectomy.  5. Tonsillectomy.  6. Removal of cysts and bunions.   PAST MEDICAL HISTORY:  1. Functional heart murmur.  2. Cystitis.   ALLERGIES:  TAGAMET, CLARITIN, ALLEGRA, __________ .   MEDICATIONS:  Bextra and Aygestin.   PHYSICAL EXAMINATION:  PELVIC:  Within normal limits except for tenderness  on palpation of her uterus as well as to mobilization of the cervix.  No  adnexal mass.   Her surgery was performed on August 20, LAVH with preservation of adnexa.  Lysis of adhesions was done.  Ovaries were normal x2.  Tubes were post  bilateral tubal ligation.  The estimated blood loss was less than 200 cc.  Ancef was given IV at the time of  surgery.  Postoperative evolution was  unremarkable.  The patient remained afebrile and hemodynamically stable.  She had a hemoglobin of 11.6 on postoperative day one.  Her pathology report  read cervix with no dysplasia identified, secretory endometrium, no  hyperplasia or malignancy, adenomyosis, uterine serosal fibrous adhesions,  no endometriosis or malignancy.  The patient was discharged on postoperative  day  number two in stable status.  She was prescribed Tylox and ibuprofen p.r.n.  Because of previous nitrate positive in urinalysis, a repeat urinalysis was  done before discharge which was within normal limits.  The patient was to  follow up at [facility] in three to four weeks.  Postoperative advice  were given.  Genia Del, M.D.    ML/MEDQ  D:  07/31/2002  T:  08/02/2002  Job:  [id number]

## 2011-04-11 ENCOUNTER — Telehealth: Payer: Self-pay | Admitting: *Deleted

## 2011-04-11 NOTE — Telephone Encounter (Signed)
Samples of diovan

## 2011-04-12 NOTE — Telephone Encounter (Signed)
Ok if we have some  Going generic in the fall

## 2011-04-15 NOTE — Telephone Encounter (Signed)
Pt aware that samples are up front. 

## 2011-05-09 ENCOUNTER — Telehealth: Payer: Self-pay | Admitting: Internal Medicine

## 2011-05-09 NOTE — Telephone Encounter (Signed)
I advised the pt that we do not have any samples of this med at this time. I asked the pt if the med is too expensive for her then she may need to consider getting med changes to a different one on her formulary that is less expensive so she can continue to take her meds. The pt states she will call her insurance and see what options she has and call us if she wants to change meds. Carron Curie, CMA

## 2011-07-24 ENCOUNTER — Other Ambulatory Visit (INDEPENDENT_AMBULATORY_CARE_PROVIDER_SITE_OTHER): Payer: 59

## 2011-07-24 DIAGNOSIS — Z0000 Encounter for general adult medical examination without abnormal findings: Secondary | ICD-10-CM

## 2011-07-24 LAB — POCT URINALYSIS DIPSTICK
Bilirubin, UA: NEGATIVE
Blood, UA: NEGATIVE
Glucose, UA: NEGATIVE
Leukocytes, UA: NEGATIVE
Nitrite, UA: NEGATIVE
Spec Grav, UA: 1.02
Urobilinogen, UA: 1
pH, UA: 7

## 2011-07-24 LAB — LIPID PANEL
Cholesterol: 177 mg/dL (ref 0–200)
HDL: 67.1 mg/dL (ref >39.00)
LDL Cholesterol: 98 mg/dL (ref 0–99)
Total CHOL/HDL Ratio: 3
Triglycerides: 61 mg/dL (ref 0.0–149.0)
VLDL: 12.2 mg/dL (ref 0.0–40.0)

## 2011-07-24 LAB — HEPATIC FUNCTION PANEL
ALT: 11 U/L (ref 0–35)
AST: 18 U/L (ref 0–37)
Albumin: 4.3 g/dL (ref 3.5–5.2)
Alkaline Phosphatase: 45 U/L (ref 39–117)
Bilirubin, Direct: 0 mg/dL (ref 0.0–0.3)
Total Bilirubin: 0.4 mg/dL (ref 0.3–1.2)
Total Protein: 7.7 g/dL (ref 6.0–8.3)

## 2011-07-24 LAB — CBC WITH DIFFERENTIAL/PLATELET
Basophils Absolute: 0 10*3/uL (ref 0.0–0.1)
Basophils Relative: 0.6 % (ref 0.0–3.0)
Eosinophils Absolute: 0.2 10*3/uL (ref 0.0–0.7)
Eosinophils Relative: 3.9 % (ref 0.0–5.0)
HCT: 38.5 % (ref 36.0–46.0)
Hemoglobin: 12.6 g/dL (ref 12.0–15.0)
Lymphocytes Relative: 34 % (ref 12.0–46.0)
Lymphs Abs: 1.7 10*3/uL (ref 0.7–4.0)
MCHC: 32.6 g/dL (ref 30.0–36.0)
MCV: 87.5 fl (ref 78.0–100.0)
Monocytes Absolute: 0.4 10*3/uL (ref 0.1–1.0)
Monocytes Relative: 7.3 % (ref 3.0–12.0)
Neutro Abs: 2.7 10*3/uL (ref 1.4–7.7)
Neutrophils Relative %: 54.2 % (ref 43.0–77.0)
Platelets: 314 10*3/uL (ref 150.0–400.0)
RBC: 4.41 Mil/uL (ref 3.87–5.11)
RDW: 14.4 % (ref 11.5–14.6)
WBC: 5 10*3/uL (ref 4.5–10.5)

## 2011-07-24 LAB — TSH: TSH: 0.78 u[IU]/mL (ref 0.35–5.50)

## 2011-07-24 LAB — BASIC METABOLIC PANEL
BUN: 14 mg/dL (ref 6–23)
CO2: 29 mEq/L (ref 19–32)
Calcium: 9.3 mg/dL (ref 8.4–10.5)
Chloride: 102 mEq/L (ref 96–112)
Creatinine, Ser: 0.9 mg/dL (ref 0.4–1.2)
GFR: 82.99 mL/min (ref >60.00)
Glucose, Bld: 88 mg/dL (ref 70–99)
Potassium: 4 mEq/L (ref 3.5–5.1)
Sodium: 140 mEq/L (ref 135–145)

## 2011-07-31 ENCOUNTER — Encounter: Payer: 59 | Admitting: Internal Medicine

## 2011-12-25 ENCOUNTER — Encounter: Payer: Self-pay | Admitting: Family Medicine

## 2011-12-25 ENCOUNTER — Ambulatory Visit (INDEPENDENT_AMBULATORY_CARE_PROVIDER_SITE_OTHER): Payer: 59 | Admitting: Family Medicine

## 2011-12-25 VITALS — BP 116/72 | HR 54 | Temp 98.1°F | Wt 139.0 lb

## 2011-12-25 DIAGNOSIS — J069 Acute upper respiratory infection, unspecified: Secondary | ICD-10-CM

## 2011-12-25 MED ORDER — METHYLPREDNISOLONE ACETATE 80 MG/ML IJ SUSP
120.0000 mg | Freq: Once | INTRAMUSCULAR | Status: AC
  Administered 2011-12-25: 120 mg via INTRAMUSCULAR

## 2011-12-25 NOTE — Progress Notes (Signed)
  Subjective:    Patient ID: [name], female    DOB: January 25, 1966, 46 y.o.   MRN: [id number]  HPI Here for the onset this morning of a bad ST, hoarse voice, and nasal stuffiness. No fever or cough. Advil did not help.   Review of Systems  Constitutional: Negative.   HENT: Positive for congestion and sore throat. Negative for postnasal drip and sinus pressure.        Objective:   Physical Exam  Constitutional: She appears well-developed and well-nourished.  HENT:  Right Ear: External ear normal.  Left Ear: External ear normal.  Nose: Nose normal.  Mouth/Throat: Oropharynx is clear and moist. No oropharyngeal exudate.  Eyes: Conjunctivae are normal.  Neck: No thyromegaly present.  Pulmonary/Chest: Effort normal and breath sounds normal.  Lymphadenopathy:    She has no cervical adenopathy.          Assessment & Plan:  Written out of work from today until 12-30-11. Rest, drink fluids

## 2012-01-07 ENCOUNTER — Encounter: Payer: 59 | Admitting: Internal Medicine

## 2012-01-07 DIAGNOSIS — Z0289 Encounter for other administrative examinations: Secondary | ICD-10-CM

## 2012-02-13 ENCOUNTER — Ambulatory Visit (INDEPENDENT_AMBULATORY_CARE_PROVIDER_SITE_OTHER): Payer: 59 | Admitting: Internal Medicine

## 2012-02-13 ENCOUNTER — Encounter: Payer: Self-pay | Admitting: Internal Medicine

## 2012-02-13 VITALS — BP 128/90 | HR 65 | Temp 98.1°F | Wt 133.0 lb

## 2012-02-13 DIAGNOSIS — J309 Allergic rhinitis, unspecified: Secondary | ICD-10-CM

## 2012-02-13 DIAGNOSIS — I10 Essential (primary) hypertension: Secondary | ICD-10-CM

## 2012-02-13 DIAGNOSIS — J45909 Unspecified asthma, uncomplicated: Secondary | ICD-10-CM

## 2012-02-13 MED ORDER — FLUTICASONE PROPIONATE 50 MCG/ACT NA SUSP: 2.0000 | Freq: Every day | NASAL | Status: DC

## 2012-02-13 NOTE — Progress Notes (Signed)
  Subjective:    Patient ID: [name], female    DOB: 20-Mar-1966, 46 y.o.   MRN: [id number]  HPI Pt comes  In today  because she needs reassessment of her asthma and FMLA paperwork to be completed. She has been doing fairly well up until End of February and MAch  Had to be out of work for 2 episodes that lasted a couple days to 3 days off didn't go to the emergency room. She used her inhaler back on the Symbicort and it resolved. She's not using the Symbicort everyday but mostly as needed. The pro where it does help as a rescue. Today she is doing fairly well but yesterday she had chest tightness when she went outside.  Eyes itch a bit    ? Pollen   Some chest  tightness   When goes out side good days and bad days  Able to exercise  Inside. Review of Systems No fever  Cp syncope  Vision changes wears glasses and no nocturnal sx a t this time . Still tobacco free from 2 12  12    Confirmed meds  Past history family history social history reviewed in the electronic medical record. Had consult dr wert last year.  Had no spirometry at the time    Objective:   Physical Exam BP 128/90  Pulse 65  Temp(Src) 98.1 F (36.7 C) (Oral)  Wt 133 lb (60.328 kg)  SpO2 98%  mildly hoarse  HEENT: Normocephalic ;atraumatic , Eyes;  PERRL, EOMs  Full, lids and conjunctiva clear,galssses ,Ears: no deformities, canals nl, TM landmarks normal, Nose: no deformity or discharge some congestion  Mouth : OP clear without lesion or edema . Neck: Supple without adenopathy or masses or bruits Chest:  Clear to A&P without wheezes rales or rhonchi CV:  S1-S2 no gallops or murmurs peripheral perfusion is normal Skin: normal capillary refill ,turgor , color: No acute rashes ,petechiae or bruising     Assessment & Plan:   Recurrent RAD asthmatic   Triggers ? Pollen and RTIs?   Has stopped tobacco  Baseline pfts were ok last year per dr Sherene Sires.   Had a long discussion about using controller medications on a  daily basis during high risk times. She is in the pollen season now and suggest she take her Symbicort everyday and then can taper if needed. Goal would be to not have attacks that keep her out of work or decrease her activity in any significant way.  The pro air seems to help her acutely but we don't want to ever use that either  He has some mild upper respiratory symptoms with eyes itching will reinstitute Flonase during the spring season for control.  There are other options if needed such as Singulair other inhalers but at this time we'll go with the plan as above  Will Complete FMLA form  Plan preventive visit and med check in about 5 months when she is due.

## 2012-02-13 NOTE — Patient Instructions (Addendum)
Send [place] a copy of your past FMLA form. We'll contact you when FMLA form and is ready should  Take control her medication every day and the risk times such as the spring. I would have you take 2 puffs twice a day after Symbicort for one to 2 weeks and then if doing well can decrease it to 1 puff twice a day.  We will add nasal cortisone during the spring season to see if it decreases her eye symptoms and upper respiratory allergy symptoms. Take this every day also as a controller medication. If we control the upper respiratory symptoms it can also help control the asthma.   Congratulations on continuing tobacco free.  Asthma Attack Prevention HOW CAN ASTHMA BE PREVENTED? Currently, there is no way to prevent asthma from starting. However, you can take steps to control the disease and prevent its symptoms after you have been diagnosed. Learn about your asthma and how to control it. Take an active role to control your asthma by working with your caregiver to create and follow an asthma action plan. An asthma action plan guides you in taking your medicines properly, avoiding factors that make your asthma worse, tracking your level of asthma control, responding to worsening asthma, and seeking emergency care when needed. To track your asthma, keep records of your symptoms, check your peak flow number using a peak flow meter (handheld device that shows how well air moves out of your lungs), and get regular asthma checkups.  Other ways to prevent asthma attacks include:  Use medicines as your caregiver directs.   Identify and avoid things that make your asthma worse (as much as you can).   Keep track of your asthma symptoms and level of control.   Get regular checkups for your asthma.   With your caregiver, write a detailed plan for taking medicines and managing an asthma attack. Then be sure to follow your action plan. Asthma is an ongoing condition that needs regular monitoring and treatment.    Identify and avoid asthma triggers. A number of outdoor allergens and irritants (pollen, mold, cold air, air pollution) can trigger asthma attacks. Find out what causes or makes your asthma worse, and take steps to avoid those triggers (see below).   Monitor your breathing. Learn to recognize warning signs of an attack, such as slight coughing, wheezing or shortness of breath. However, your lung function may already decrease before you notice any signs or symptoms, so regularly measure and record your peak airflow with a home peak flow meter.   Identify and treat attacks early. If you act quickly, you're less likely to have a severe attack. You will also need less medicine to control your symptoms. When your peak flow measurements decrease and alert you to an upcoming attack, take your medicine as instructed, and immediately stop any activity that may have triggered the attack. If your symptoms do not improve, get medical help.   Pay attention to increasing quick-relief inhaler use. If you find yourself relying on your quick-relief inhaler (such as albuterol), your asthma is not under control. See your caregiver about adjusting your treatment.  IDENTIFY AND CONTROL FACTORS THAT MAKE YOUR ASTHMA WORSE A number of common things can set off or make your asthma symptoms worse (asthma triggers). Keep track of your asthma symptoms for several weeks, detailing all the environmental and emotional factors that are linked with your asthma. When you have an asthma attack, go back to your asthma diary to see which factor,  or combination of factors, might have contributed to it. Once you know what these factors are, you can take steps to control many of them.  Allergies: If you have allergies and asthma, it is important to take asthma prevention steps at home. Asthma attacks (worsening of asthma symptoms) can be triggered by allergies, which can cause temporary increased inflammation of your airways. Minimizing  contact with the substance to which you are allergic will help prevent an asthma attack. Animal Dander:   Some people are allergic to the flakes of skin or dried saliva from animals with fur or feathers. Keep these pets out of your home.   If you can't keep a pet outdoors, keep the pet out of your bedroom and other sleeping areas at all times, and keep the door closed.   Remove carpets and furniture covered with cloth from your home. If that is not possible, keep the pet away from fabric-covered furniture and carpets.  Dust Mites:  Many people with asthma are allergic to dust mites. Dust mites are tiny bugs that are found in every home, in mattresses, pillows, carpets, fabric-covered furniture, bedcovers, clothes, stuffed toys, fabric, and other fabric-covered items.   Cover your mattress in a special dust-proof cover.   Cover your pillow in a special dust-proof cover, or wash the pillow each week in hot water. Water must be hotter than 130 F to kill dust mites. Cold or warm water used with detergent and bleach can also be effective.   Wash the sheets and blankets on your bed each week in hot water.   Try not to sleep or lie on cloth-covered cushions.   Call ahead when traveling and ask for a smoke-free hotel room. Bring your own bedding and pillows, in case the hotel only supplies feather pillows and down comforters, which may contain dust mites and cause asthma symptoms.   Remove carpets from your bedroom and those laid on concrete, if you can.   Keep stuffed toys out of the bed, or wash the toys weekly in hot water or cooler water with detergent and bleach.  Cockroaches:  Many people with asthma are allergic to the droppings and remains of cockroaches.   Keep food and garbage in closed containers. Never leave food out.   Use poison baits, traps, powders, gels, or paste (for example, boric acid).   If a spray is used to kill cockroaches, stay out of the room until the odor goes  away.  Indoor Mold:  Fix leaky faucets, pipes, or other sources of water that have mold around them.   Clean moldy surfaces with a cleaner that has bleach in it.  Pollen and Outdoor Mold:  When pollen or mold spore counts are high, try to keep your windows closed.   Stay indoors with windows closed from late morning to afternoon, if you can. Pollen and some mold spore counts are highest at that time.   Ask your caregiver whether you need to take or increase anti-inflammatory medicine before your allergy season starts.  Irritants:   Tobacco smoke is an irritant. If you smoke, ask your caregiver how you can quit. Ask family members to quit smoking, too. Do not allow smoking in your home or car.   If possible, do not use a wood-burning stove, kerosene heater, or fireplace. Minimize exposure to all sources of smoke, including incense, candles, fires, and fireworks.   Try to stay away from strong odors and sprays, such as perfume, talcum powder, hair spray, and  paints.   Decrease humidity in your home and use an indoor air cleaning device. Reduce indoor humidity to below 60 percent. Dehumidifiers or central air conditioners can do this.   Try to have someone else vacuum for you once or twice a week, if you can. Stay out of rooms while they are being vacuumed and for a short while afterward.   If you vacuum, use a dust mask from a hardware store, a double-layered or microfilter vacuum cleaner bag, or a vacuum cleaner with a HEPA filter.   Sulfites in foods and beverages can be irritants. Do not drink beer or wine, or eat dried fruit, processed potatoes, or shrimp if they cause asthma symptoms.   Cold air can trigger an asthma attack. Cover your nose and mouth with a scarf on cold or windy days.   Several health conditions can make asthma more difficult to manage, including runny nose, sinus infections, reflux disease, psychological stress, and sleep apnea. Your caregiver will treat these  conditions, as well.   Avoid close contact with people who have a cold or the flu, since your asthma symptoms may get worse if you catch the infection from them. Wash your hands thoroughly after touching items that may have been handled by people with a respiratory infection.   Get a flu shot every year to protect against the flu virus, which often makes asthma worse for days or weeks. Also get a pneumonia shot once every five to 10 years.  Drugs:  Aspirin and other painkillers can cause asthma attacks. 10% to 20% of people with asthma have sensitivity to aspirin or a group of painkillers called non-steroidal anti-inflammatory drugs (NSAIDS), such as ibuprofen and naproxen. These drugs are used to treat pain and reduce fevers. Asthma attacks caused by any of these medicines can be severe and even fatal. These drugs must be avoided in people who have known aspirin sensitive asthma. Products with acetaminophen are considered safe for people who have asthma. It is important that people with aspirin sensitivity read labels of all over-the-counter drugs used to treat pain, colds, coughs, and fever.   Beta blockers and ACE inhibitors are other drugs which you should discuss with your caregiver, in relation to your asthma.  ALLERGY SKIN TESTING  Ask your asthma caregiver about allergy skin testing or blood testing (RAST test) to identify the allergens to which you are sensitive. If you are found to have allergies, allergy shots (immunotherapy) for asthma may help prevent future allergies and asthma. With allergy shots, small doses of allergens (substances to which you are allergic) are injected under your skin on a regular schedule. Over a period of time, your body may become used to the allergen and less responsive with asthma symptoms. You can also take measures to minimize your exposure to those allergens. EXERCISE  If you have exercise-induced asthma, or are planning vigorous exercise, or exercise in cold,  humid, or dry environments, prevent exercise-induced asthma by following your caregiver's advice regarding asthma treatment before exercising. Document Released: 10/09/2009 Document Revised: 10/10/2011 Document Reviewed: 10/09/2009 [facility] Patient Information 2012 [place], [place].

## 2012-02-17 ENCOUNTER — Telehealth: Payer: Self-pay | Admitting: Internal Medicine

## 2012-02-17 NOTE — Telephone Encounter (Signed)
Called pt's cell phone to make aware but vm full.  Called pt at work number and line was busy.  Will attempt to call at a later time.

## 2012-02-17 NOTE — Telephone Encounter (Signed)
Pt aware paper is ready

## 2012-02-17 NOTE — Telephone Encounter (Signed)
Patient called to check the status of her FMLA paperwork as she has a deadline for tomorrow. Patient can be reached at [phone]. Please advise.

## 2012-02-17 NOTE — Telephone Encounter (Signed)
Pt request paperwork be faxed to [phone].  Paperwork faxed.

## 2012-03-25 ENCOUNTER — Telehealth: Payer: Self-pay | Admitting: Internal Medicine

## 2012-03-25 DIAGNOSIS — Z91018 Allergy to other foods: Secondary | ICD-10-CM

## 2012-03-25 NOTE — Telephone Encounter (Signed)
Attempted to call pt for more information.  Number busy will attempt to call at a later time.

## 2012-03-25 NOTE — Telephone Encounter (Signed)
Pt thinks she is having an allergic reaction to food and is interested in getting an epi pin. Please contact

## 2012-03-27 NOTE — Telephone Encounter (Signed)
Left a message for pt to return call 

## 2012-03-31 NOTE — Telephone Encounter (Signed)
She needs to  See  An allergist  Refer as ap. Also have liquid benadryl on hand to take 25- 50 mg if gets reaction.

## 2012-03-31 NOTE — Telephone Encounter (Signed)
Called and spoke with pt and pt is aware of Dr. Rosezella [place] recommendations.  Referral ordered.

## 2012-03-31 NOTE — Telephone Encounter (Signed)
Pt states they had snack day at work and about 10 minutes later after eating Mediterranean crackers or a pound cake with lemon extract. Pt states these are the only two new things she tried.  Pt experienced swelling, face tight, tongue and mouth swelling. Pt states she had the "crawly ant feeling on the inside." Pt states she does not have an allergist but thinks she may need one.   Pt states this is the worst reaction she has ever had.  Pls advise.

## 2012-04-21 ENCOUNTER — Ambulatory Visit (INDEPENDENT_AMBULATORY_CARE_PROVIDER_SITE_OTHER): Payer: 59 | Admitting: Family Medicine

## 2012-04-21 ENCOUNTER — Encounter: Payer: Self-pay | Admitting: Family Medicine

## 2012-04-21 VITALS — BP 150/86 | HR 71 | Temp 98.3°F | Wt 130.0 lb

## 2012-04-21 DIAGNOSIS — I10 Essential (primary) hypertension: Secondary | ICD-10-CM

## 2012-04-21 MED ORDER — CLONIDINE HCL 0.1 MG PO TABS: 0.1000 mg | ORAL_TABLET | Freq: Once | ORAL | Status: DC

## 2012-04-21 NOTE — Progress Notes (Signed)
  Subjective:    Patient ID: [name], female    DOB: April 19, 1966, 46 y.o.   MRN: [id number]  HPI Here for elevated BP. She had been taking Diovan HCT for some time, and her HTN had been well controlled. Then she started a strict diet and exercise routine, and she wanted to try coming off the med. She stopped taking this in March, and for a time her BP maintained steady in the 120s over 80s at home and she felt good. The past time she checked it was about 3 weeks ago. Then she began to feel very fatigued about one week ago. This morning after she got to work she felt lightheaded and she developed a HA. No chest pain or SOB. Her BP at work today was 169/111. A co-worker drove her here. She took a Diovan HCT about 45 minutes before getting her, and she already feels a little better. Of note, she had normal labs including a BMET about 9 months ago.    Review of Systems  Constitutional: Negative.   Eyes: Negative.   Respiratory: Negative.   Cardiovascular: Negative.   Neurological: Positive for light-headedness and headaches.       Objective:   Physical Exam  Constitutional:       Alert, slightly ill appearing   Eyes: Conjunctivae are normal. Pupils are equal, round, and reactive to light.  Neck: No thyromegaly present.  Cardiovascular: Normal rate, regular rhythm, normal heart sounds and intact distal pulses.  Exam reveals no gallop and no friction rub.   No murmur heard. Pulmonary/Chest: Effort normal and breath sounds normal. No respiratory distress. She has no wheezes. She has no rales.  Lymphadenopathy:    She has no cervical adenopathy.          Assessment & Plan:  Her BP has probably been creeping up over the past few weeks. I told her she should never stop a medication without discussing this with her doctor first. She agreed to go back on Diovan HCT every day. Today we gave her one Clonidine 0.1 mg tablet and monitored her closely over the next hour. Her BP has settled  down to 140/86. She feels much better now, so we will let her friend drive her home. We wrote her out of work today through 04-23-12 and she will return to work on 04-24-12. Follow up with Dr. Fabian Sharp on 2 weeks

## 2012-05-05 ENCOUNTER — Telehealth: Payer: Self-pay | Admitting: Internal Medicine

## 2012-05-05 NOTE — Telephone Encounter (Signed)
Pt called and said that she was in for ov to see Dr Clent Ridges on 04/21/12 re: High BP. Pt is trying to get FMLA through her work and is wondering if Dr Fabian Sharp could complete form re: what happened during ov with Dr Clent Ridges, or would Dr Clent Ridges need to complete form? Pt will bring FMLA form on Friday for ov with Dr Fabian Sharp.

## 2012-05-05 NOTE — Telephone Encounter (Signed)
Left message on voicemail for the pt to call back. 

## 2012-05-05 NOTE — Telephone Encounter (Signed)
I tried to reach pt by phone, no answer, left voice message for pt to bring forms in for office visit.

## 2012-05-08 ENCOUNTER — Encounter: Payer: Self-pay | Admitting: Internal Medicine

## 2012-05-08 ENCOUNTER — Ambulatory Visit (INDEPENDENT_AMBULATORY_CARE_PROVIDER_SITE_OTHER): Payer: 59 | Admitting: Internal Medicine

## 2012-05-08 VITALS — BP 114/80 | HR 100 | Temp 99.1°F | Wt 129.0 lb

## 2012-05-08 DIAGNOSIS — Z72820 Sleep deprivation: Secondary | ICD-10-CM

## 2012-05-08 DIAGNOSIS — I10 Essential (primary) hypertension: Secondary | ICD-10-CM

## 2012-05-08 DIAGNOSIS — R51 Headache: Secondary | ICD-10-CM

## 2012-05-08 NOTE — Patient Instructions (Signed)
Your blood pressure is much better today. And followup after checkup.  Optimize her sleep and rest time as we discussed sleep deprivation is a risk for injury and poor concentration.  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

## 2012-05-08 NOTE — Progress Notes (Signed)
  Subjective:    Patient ID: [name], female    DOB: [birth year], 46 y.o.   MRN: [id number]  HPI Patient comes in today for  FU of  bp problem problem evaluation. See last ov with dr fry who put her back on her medication when she presented with elevated BP readings and also gave her clonidine one dose .  BP has come down since then and no more HEadaches.  NO se of meds. Sleep about 3 hours average  Working and school on line courses.  Still battling this intense schedule.  Allergies stable has epi pen evaluated by Allergy. Employer asking for other FMLA forms  One for the days out of work for the BP episode and one for the anaphylaxis evaluation.  And if needed to go to the ed after epi need.   Review of Systems No fever chills neuro sx has better . Past history family history social history reviewed in the electronic medical record.    Objective:   Physical Exam BP 114/80  Pulse 100  Temp 99.1 F (37.3 C) (Oral)  Wt 129 lb (58.514 kg)  SpO2 99%  WDWN  in nad  Repeat BP right arm 120/80  Looks well but slightly tired  Chest:  Clear to A&P without wheezes rales or rhonchi CV:  S1-S2 no gallops or murmurs peripheral perfusion is normal NEuro intact non focal . Reviewed prev eval.    Assessment & Plan:   Hypertension  Better  HAs  Better on meds. Sleep deprivation   Counseled. Life style and strategies at this time  Health effects of sleep deprivation. Allergist should complete FMLA for allergy  . FMLA for BP will be done from our office .   FU when due. At check up.  Total visit > 50% spent counseling and coordinating care

## 2012-05-09 DIAGNOSIS — Z72820 Sleep deprivation: Secondary | ICD-10-CM | POA: Insufficient documentation

## 2012-05-13 DIAGNOSIS — Z0279 Encounter for issue of other medical certificate: Secondary | ICD-10-CM

## 2012-06-03 ENCOUNTER — Other Ambulatory Visit: Payer: Self-pay | Admitting: Internal Medicine

## 2012-07-15 ENCOUNTER — Other Ambulatory Visit: Payer: 59

## 2012-07-21 ENCOUNTER — Encounter: Payer: 59 | Admitting: Internal Medicine

## 2012-11-24 ENCOUNTER — Ambulatory Visit (INDEPENDENT_AMBULATORY_CARE_PROVIDER_SITE_OTHER): Payer: 59 | Admitting: Internal Medicine

## 2012-11-24 ENCOUNTER — Encounter: Payer: Self-pay | Admitting: Internal Medicine

## 2012-11-24 VITALS — BP 124/80 | HR 78 | Temp 97.8°F | Wt 135.0 lb

## 2012-11-24 DIAGNOSIS — H5789 Other specified disorders of eye and adnexa: Secondary | ICD-10-CM | POA: Insufficient documentation

## 2012-11-24 MED ORDER — POLYMYXIN B-TRIMETHOPRIM 10000-0.1 UNIT/ML-% OP SOLN: 1.0000 [drp] | OPHTHALMIC | Status: DC

## 2012-11-24 NOTE — Patient Instructions (Addendum)
Can use antibiotic eye drops  After warm compresses  However if having eye pain .    Vision changes ,Need more follow up. If any  Sx of shingles then contact us immediatly and see eye doctor.   Get eye appt this week    .

## 2012-11-24 NOTE — Progress Notes (Signed)
Chief Complaint  Patient presents with  . Eye Drainage    Eye has had drainage that is while and thick.  Started this morning when she woke up.  . Belepharitis    HPI: Patient comes in today for SDA for  new problem evaluation. She was in her usual state of health until this morning when she woke with a left eye that was red and a bit swollen around living crusted shut. She has had no fever but has had an intermittent cough without a significant sore throat. She has no change in her vision but feels filmier for her ; doesn't wear contacts wears glasses. She does have sensitivity to light.  ROS: See pertinent positives and negatives per HPI. Has a history of shingles on the scalp not recently no eye issues Past Medical History  Diagnosis Date  . Allergy   . Asthma   . GERD (gastroesophageal reflux disease)   . Hypertension   . Positive TB test     Family History  Problem Relation Age of Onset  . Hypertension Mother   . Alzheimer's disease Mother   . Hypertension Father   . Prostate cancer Father   . HIV Brother   . Lupus Sister     History   Social History  . Marital Status: Divorced    Spouse Name: N/A    Number of Children: N/A  . Years of Education: N/A   Social History Main Topics  . Smoking status: Former Smoker -- 0.5 packs/day for 17 years    Types: Cigarettes    Quit date: 12/12/2010  . Smokeless tobacco: Never Used  . Alcohol Use: No  . Drug Use: No  . Sexually Active: None   Other Topics Concern  . 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 albuterol (PROAIR HFA) 108 (90 BASE) MCG/ACT inhaler Inhale 2 puffs into the lungs every 4 (four) hours as needed.        . budesonide-formoterol (SYMBICORT) 80-4.5 MCG/ACT inhaler Inhale 2 puffs into the lungs 2 (two) times  daily.        . cetirizine (ZYRTEC) 10 MG tablet Take 10 mg by mouth daily.      Marland Kitchen DIOVAN HCT 160-12.5 MG per tablet TAKE 1 TABLET BY MOUTH DAILY.  90 tablet  1  . esomeprazole (NEXIUM) 40 MG capsule Take 40 mg by mouth daily before breakfast.        . fluticasone (FLONASE) 50 MCG/ACT nasal spray Place 2 sprays into the nose daily.  16 g  12  . Olopatadine HCl (PATADAY) 0.2 % SOLN Apply 1 drop to eye daily as needed.      . trimethoprim-polymyxin b (POLYTRIM) ophthalmic solution Place 1 drop into the left eye every 4 (four) hours.  10 mL  0  . [DISCONTINUED] cloNIDine (CATAPRES) 0.1 MG tablet Take 1 tablet (0.1 mg total) by mouth once.  1 tablet  0    EXAM:  BP 124/80  Pulse 78  Temp 97.8 F (36.6 C) (Oral)  Wt 135 lb (61.236 kg)  SpO2 97%  There is no height on file to calculate BMI.  GENERAL: vitals reviewed and listed above, alert, oriented, appears well hydrated and in no acute distress she has a dry cough  HEENT: atraumatic, left eyes shows conjunctival injection +1  without edema the eye lid is slightly pink and puffy around this and nontender there is no rash vesicles EOMs are full.   Left eye is clear she does have photophobia but her pupils equal round reactive to light. There is some mucoid hearing  NECK: no obvious masses on inspection palpation . No adenopathy  LUNGS: clear to auscultation bilaterally, no wheezes, rales or rhonchi, good air movement Abdomen soft without organomegaly CV: HRRR, no clubbing cyanosis or  peripheral edema nl cap refill  Skin no acute rashes MS: moves all extremities without noticeable focal  abnormality  PSYCH: pleasant and cooperative, no obvious depression or anxiety  ASSESSMENT AND PLAN:  Discussed the following assessment and plan:  1. Redness of eye, left    Presumed conjunctivitis but she has a bit more photophobia than would expect. She's only had this for 12 hours follow closely empiric antibiotic treatment   Monitor her for  signs of herpetic involvement or other complications she does have a cough which goes with a viral illness but has no signs of sinusitis or cellulitis. No fever. Would have her see eye doctor this week. 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 -Patient advised to return or notify health care team  immediately if symptoms worsen or persist or new concerns arise.  Patient Instructions  Can use antibiotic eye drops  After warm compresses  However if having eye pain .    Vision changes ,Need more follow up. If any  Sx of shingles then contact us immediatly and see eye doctor.   Get eye appt this week    .        Neta Mends. [name] M.D.

## 2012-12-22 ENCOUNTER — Encounter (HOSPITAL_COMMUNITY): Payer: Self-pay | Admitting: Emergency Medicine

## 2012-12-22 ENCOUNTER — Ambulatory Visit (INDEPENDENT_AMBULATORY_CARE_PROVIDER_SITE_OTHER): Payer: 59 | Admitting: Family Medicine

## 2012-12-22 ENCOUNTER — Emergency Department (HOSPITAL_COMMUNITY)
Admission: EM | Admit: 2012-12-22 | Discharge: 2012-12-22 | Disposition: A | Discharge status: Home / Self Care | Payer: 59 | Attending: Emergency Medicine | Admitting: Emergency Medicine

## 2012-12-22 VITALS — BP 138/90 | HR 65

## 2012-12-22 DIAGNOSIS — Z87891 Personal history of nicotine dependence: Secondary | ICD-10-CM | POA: Insufficient documentation

## 2012-12-22 DIAGNOSIS — I10 Essential (primary) hypertension: Secondary | ICD-10-CM | POA: Insufficient documentation

## 2012-12-22 DIAGNOSIS — R42 Dizziness and giddiness: Secondary | ICD-10-CM | POA: Insufficient documentation

## 2012-12-22 DIAGNOSIS — T7840XA Allergy, unspecified, initial encounter: Secondary | ICD-10-CM

## 2012-12-22 DIAGNOSIS — Z79899 Other long term (current) drug therapy: Secondary | ICD-10-CM | POA: Insufficient documentation

## 2012-12-22 DIAGNOSIS — K219 Gastro-esophageal reflux disease without esophagitis: Secondary | ICD-10-CM | POA: Insufficient documentation

## 2012-12-22 DIAGNOSIS — T783XXA Angioneurotic edema, initial encounter: Secondary | ICD-10-CM | POA: Insufficient documentation

## 2012-12-22 DIAGNOSIS — IMO0002 Reserved for concepts with insufficient information to code with codable children: Secondary | ICD-10-CM | POA: Insufficient documentation

## 2012-12-22 DIAGNOSIS — T502X5A Adverse effect of carbonic-anhydrase inhibitors, benzothiadiazides and other diuretics, initial encounter: Secondary | ICD-10-CM | POA: Insufficient documentation

## 2012-12-22 DIAGNOSIS — J45909 Unspecified asthma, uncomplicated: Secondary | ICD-10-CM | POA: Insufficient documentation

## 2012-12-22 DIAGNOSIS — R21 Rash and other nonspecific skin eruption: Secondary | ICD-10-CM | POA: Insufficient documentation

## 2012-12-22 DIAGNOSIS — R220 Localized swelling, mass and lump, head: Secondary | ICD-10-CM | POA: Insufficient documentation

## 2012-12-22 LAB — POCT I-STAT, CHEM 8
BUN: 4 mg/dL — ABNORMAL LOW (ref 6–23)
Calcium, Ion: 1.18 mmol/L (ref 1.12–1.23)
Chloride: 105 mEq/L (ref 96–112)
Creatinine, Ser: 1 mg/dL (ref 0.50–1.10)
Glucose, Bld: 89 mg/dL (ref 70–99)
HCT: 36 % (ref 36.0–46.0)
Hemoglobin: 12.2 g/dL (ref 12.0–15.0)
Potassium: 3.4 mEq/L — ABNORMAL LOW (ref 3.5–5.1)
Sodium: 141 mEq/L (ref 135–145)
TCO2: 27 mmol/L (ref 0–100)

## 2012-12-22 MED ORDER — CETIRIZINE HCL 10 MG PO TABS: 10.0000 mg | ORAL_TABLET | Freq: Every day | ORAL | Status: DC

## 2012-12-22 NOTE — ED Notes (Signed)
Pt states that she is not as swollen as she was earlier, but swollen around eyes and upper checks.

## 2012-12-22 NOTE — Progress Notes (Signed)
Chief Complaint  Patient presents with  . Allergic Reaction    to generic diovan; face swelling     HPI:  Acute visit for "allergy to Diovan": -called urgently from another pt room to evaluate this patient I have never seen before -she is tearful and reports hx of serious allergic reaction in the past and has epipen and sees allergist -she reports restarted Diovan 2 days ago -report 2 hours ago she started have swelling in lips, around, nose and in eyes and checks, and feels ike throat is swelling, she also report mild SOB, dizziness and nausea -denies: hives, fevers, cough, other new exposures other then medication change above  ROS: See pertinent positives and negatives per HPI.  Past Medical History  Diagnosis Date  . Allergy   . Asthma   . GERD (gastroesophageal reflux disease)   . Hypertension   . Positive TB test     Family History  Problem Relation Age of Onset  . Hypertension Mother   . Alzheimer's disease Mother   . Hypertension Father   . Prostate cancer Father   . HIV Brother   . Lupus Sister     History   Social History  . Marital Status: Divorced    Spouse Name: N/A    Number of Children: N/A  . Years of Education: N/A   Social History Main Topics  . Smoking status: Former Smoker -- 0.50 packs/day for 17 years    Types: Cigarettes    Quit date: 12/12/2010  . Smokeless tobacco: Never Used  . Alcohol Use: No  . Drug Use: No  . Sexually Active: Not on file   Other Topics Concern  . 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  , Disp: , Rfl: ;  budesonide-formoterol (SYMBICORT) 80-4.5 MCG/ACT inhaler, Inhale 2 puffs into the lungs 2 (two) times daily.  , Disp: , Rfl: ;  cetirizine  (ZYRTEC) 10 MG tablet, Take 10 mg by mouth daily., Disp: , Rfl:  DIOVAN HCT 160-12.5 MG per tablet, TAKE 1 TABLET BY MOUTH DAILY., Disp: 90 tablet, Rfl: 1;  esomeprazole (NEXIUM) 40 MG capsule, Take 40 mg by mouth daily before breakfast.  , Disp: , Rfl: ;  fluticasone (FLONASE) 50 MCG/ACT nasal spray, Place 2 sprays into the nose daily., Disp: 16 g, Rfl: 12;  Olopatadine HCl (PATADAY) 0.2 % SOLN, Apply 1 drop to eye daily as needed., Disp: , Rfl:  trimethoprim-polymyxin b (POLYTRIM) ophthalmic solution, Place 1 drop into the left eye every 4 (four) hours., Disp: 10 mL, Rfl: 0  EXAM:  Filed Vitals:   12/22/12 1143  BP: 138/90  Pulse: 65    There is no weight on file to calculate BMI.  GENERAL: vitals reviewed and listed above, alert, oriented, appears well hydrated, tearful  HEENT: atraumatic, conjunttiva clear, perhaps minimal erythema and swelling of eyelids and lips - but I have not seen this patient before  NECK: no obvious masses or swelling on inspection  LUNGS: clear to auscultation bilaterally, no wheezes, rales or rhonchi, good air movement  CV: HRRR, no peripheral edema  MS: moves all extremities without noticeable abnormality  PSYCH: pleasant and cooperative, no obvious depression or anxiety  SKIN: mild erythema  of skin around eyes and nose  ASSESSMENT AND PLAN:  Discussed the following assessment and plan:  Allergic reaction  -discussed with PCP as pt appears well with clear lungs and difficult to appreciate much swelling on exam. However, daughter present reports she does think patient has facial swelling. -PCP reporst hx of angioedema -Given pt complaints concerning for angioedema and past history 25mg  benadryl given here and advise observation in ED in case of developing angioedema -monitored closely until arrival of EMS -Patient advised to return or notify a doctor immediately if symptoms worsen or persist or new concerns arise.  Patient Instructions        [name].

## 2012-12-22 NOTE — ED Notes (Signed)
Pt stated new med yesterday and was fine. Today pt reports that her face feels swollen and dizzy so went to her PCP and was referred to ED for further eval.

## 2012-12-22 NOTE — ED Provider Notes (Signed)
History     CSN: [id number]  Arrival date & time 12/22/12  1219   First MD Initiated Contact with Patient 12/22/12 1259      Chief Complaint  Patient presents with  . Facial Swelling  . Allergic Reaction  . Dizziness    (Consider location/radiation/quality/duration/timing/severity/associated sxs/prior treatment) HPI Comments: Pt started a new medication yesterday.  This was a new generic medication for diovan.  This morning she noticed a rah on her face as well as a sensation in her throat.  She went to her doctor's office and was given benadryl.  Her symptoms are improving but she was sent to the ED to be checked.  Patient is a 47 y.o. female presenting with allergic reaction. The history is provided by the patient.  Allergic Reaction The primary symptoms are  rash and angioedema. The primary symptoms do not include shortness of breath. The current episode started 3 to 5 hours ago. The problem has been gradually improving. This is a new problem.  The rash is not associated with itching.  The angioedema is not associated with shortness of breath.  Significant symptoms also include flushing. Significant symptoms that are not present include eye redness, rhinorrhea or itching.    Past Medical History  Diagnosis Date  . Allergy   . Asthma   . GERD (gastroesophageal reflux disease)   . Hypertension   . Positive TB test     Past Surgical History  Procedure Laterality Date  . Cholecystectomy    . Abdominal hysterectomy    . Tubal ligation    . Panendoscopy    . Bunionectomy      both feet  . Cystectomy      left shoulder  . Breast surgery      cyst from left breast  . Uterine ablastion      and polyps removed    Family History  Problem Relation Age of Onset  . Hypertension Mother   . Alzheimer's disease Mother   . Hypertension Father   . Prostate cancer Father   . HIV Brother   . Lupus Sister     History  Substance Use Topics  . Smoking status: Former Smoker  -- 0.50 packs/day for 17 years    Types: Cigarettes    Quit date: 12/12/2010  . Smokeless tobacco: Never Used  . Alcohol Use: No    OB History   Grav Para Term Preterm Abortions TAB SAB Ect Mult Living                  Review of Systems  HENT: Negative for rhinorrhea.   Eyes: Negative for redness.  Respiratory: Negative for shortness of breath.   Skin: Positive for flushing and rash. Negative for itching.  All other systems reviewed and are negative.    Allergies  Chocolate; Cimetidine; Citric acid; Coly-mycin s; Fexofenadine; and Peanut-containing drug products  Home Medications   Current Outpatient Rx  Name  Route  Sig  Dispense  Refill  . albuterol (PROAIR HFA) 108 (90 BASE) MCG/ACT inhaler   Inhalation   Inhale 2 puffs into the lungs every 4 (four) hours as needed.           . budesonide-formoterol (SYMBICORT) 80-4.5 MCG/ACT inhaler   Inhalation   Inhale 2 puffs into the lungs 2 (two) times daily.           . cetirizine (ZYRTEC) 10 MG tablet   Oral   Take 10 mg by mouth daily.         Marland Kitchen  DIOVAN HCT 160-12.5 MG per tablet      TAKE 1 TABLET BY MOUTH DAILY.   90 tablet   1     NEEDS REFILLS--FILL FOR GENERIC   . esomeprazole (NEXIUM) 40 MG capsule   Oral   Take 40 mg by mouth daily before breakfast.           . fluticasone (FLONASE) 50 MCG/ACT nasal spray   Nasal   Place 2 sprays into the nose daily.   16 g   12   . Olopatadine HCl (PATADAY) 0.2 % SOLN   Ophthalmic   Apply 1 drop to eye daily as needed.         . trimethoprim-polymyxin b (POLYTRIM) ophthalmic solution   Left Eye   Place 1 drop into the left eye every 4 (four) hours.   10 mL   0     BP 147/95  Pulse 59  Temp(Src) 98.4 F (36.9 C) (Oral)  Resp 18  SpO2 100%  Physical Exam  Nursing note and vitals reviewed. Constitutional: She appears well-developed and well-nourished. No distress.  HENT:  Head: Normocephalic and atraumatic.  Right Ear: External ear normal.   Left Ear: External ear normal.  Mouth/Throat: No edematous. No oropharyngeal exudate.  Eyes: Conjunctivae are normal. Right eye exhibits no discharge. Left eye exhibits no discharge. No scleral icterus.  Neck: Neck supple. No tracheal deviation present.  Cardiovascular: Normal rate, regular rhythm and intact distal pulses.   Pulmonary/Chest: Effort normal and breath sounds normal. No stridor. No respiratory distress. She has no wheezes. She has no rales.  Abdominal: Soft. Bowel sounds are normal. She exhibits no distension. There is no tenderness. There is no rebound and no guarding.  Musculoskeletal: She exhibits no edema and no tenderness.  Neurological: She is alert. She has normal strength. No sensory deficit. Cranial nerve deficit:  no gross defecits noted. She exhibits normal muscle tone. She displays no seizure activity. Coordination normal.  Skin: Skin is warm and dry. No rash noted.  Psychiatric: She has a normal mood and affect.    ED Course  Procedures (including critical care time)  Labs Reviewed  POCT I-STAT, CHEM 8 - Abnormal; Notable for the following:    Potassium 3.4 (*)    BUN 4 (*)    All other components within normal limits   No results found.   1. Allergic reaction       MDM  The patient initially requested that she not be given any steroids unless absolutely necessary. She has had a reaction in the past where she had insomnia for several days. the patient did not have any significant symptoms while she was here. She was monitored and had no further evidence of angioedema or anaphylaxis.  I suspect her symptoms were related to the generic medication. I doubt that this was an ACE inhibitor induced reaction as his symptoms responded rapidly to antihistamines. I recommended the patient return to her non-generic medication. She should monitor for any recurrent reaction. I will have her take antihistamines for the next few days       Celene Kras,  MD 12/22/12 1538

## 2012-12-23 ENCOUNTER — Telehealth: Payer: Self-pay | Admitting: Internal Medicine

## 2012-12-23 NOTE — Telephone Encounter (Signed)
Pls advise.  

## 2012-12-23 NOTE — Telephone Encounter (Signed)
Patient seen Dr. Selena Batten for this problem.  Does she want to answer this or give it to Dr. Fabian Sharp.

## 2012-12-23 NOTE — Telephone Encounter (Signed)
Pt would like you to call her concerning her DIOVAN script. They are saying her reaction yesterday was due to generic DIOVAN. The brand name is over $400. Pt doesn't know what to do or go from here. Pls advise.

## 2012-12-24 ENCOUNTER — Telehealth: Payer: Self-pay | Admitting: Internal Medicine

## 2012-12-24 NOTE — Telephone Encounter (Signed)
I will defer to her PCP whom will be managing her BP to choose a different agent.

## 2012-12-24 NOTE — Telephone Encounter (Signed)
Pt need new rx diovan 160-12.5mg  #90 with 3 refills dispense as written(daw) call into [facility] out pt pharm

## 2012-12-24 NOTE — Telephone Encounter (Signed)
Please put this med on her allergy list   No diovan  ( see she had  Ed visit yesterday) Wait about 3-4 days and  If  All better then  Begin amlodipine 5 mg 1 po qd and ROV in  A month or as needed.

## 2012-12-24 NOTE — Telephone Encounter (Signed)
Pls advise.  

## 2012-12-25 ENCOUNTER — Other Ambulatory Visit: Payer: Self-pay | Admitting: Internal Medicine

## 2012-12-25 MED ORDER — NORVASC 5 MG PO TABS: 5.0000 mg | ORAL_TABLET | Freq: Every day | ORAL | Status: DC

## 2012-12-25 NOTE — Telephone Encounter (Signed)
Patient notified and rx sent to the pharmacy

## 2012-12-29 ENCOUNTER — Other Ambulatory Visit: Payer: Self-pay | Admitting: Family Medicine

## 2012-12-29 MED ORDER — AMLODIPINE BESYLATE 5 MG PO TABS: 5.0000 mg | ORAL_TABLET | Freq: Every day | ORAL | Status: DC

## 2013-01-25 ENCOUNTER — Ambulatory Visit (INDEPENDENT_AMBULATORY_CARE_PROVIDER_SITE_OTHER): Payer: 59 | Admitting: Internal Medicine

## 2013-01-25 ENCOUNTER — Encounter: Payer: Self-pay | Admitting: Internal Medicine

## 2013-01-25 VITALS — BP 144/96 | HR 69 | Temp 98.4°F | Wt 140.0 lb

## 2013-01-25 DIAGNOSIS — Z5189 Encounter for other specified aftercare: Secondary | ICD-10-CM

## 2013-01-25 DIAGNOSIS — T7840XA Allergy, unspecified, initial encounter: Secondary | ICD-10-CM

## 2013-01-25 DIAGNOSIS — T887XXA Unspecified adverse effect of drug or medicament, initial encounter: Secondary | ICD-10-CM | POA: Insufficient documentation

## 2013-01-25 DIAGNOSIS — T50905D Adverse effect of unspecified drugs, medicaments and biological substances, subsequent encounter: Secondary | ICD-10-CM

## 2013-01-25 DIAGNOSIS — Z888 Allergy status to other drugs, medicaments and biological substances status: Secondary | ICD-10-CM

## 2013-01-25 DIAGNOSIS — I10 Essential (primary) hypertension: Secondary | ICD-10-CM

## 2013-01-25 HISTORY — DX: Allergy, unspecified, initial encounter: T78.40XA

## 2013-01-25 MED ORDER — AMLODIPINE BESYLATE 5 MG PO TABS: 5.0000 mg | ORAL_TABLET | Freq: Every day | ORAL | Status: DC

## 2013-01-25 NOTE — Patient Instructions (Addendum)
At this time will  Change med altogether and take the amlodipine.    Amlodipine.    Once a day then TOV in about 4-6 weeks .   At this time do not want to use  The [facility] drug group for the reasons we discussed

## 2013-01-25 NOTE — Progress Notes (Signed)
Chief Complaint  Patient presents with  . Follow-up    BP    HPI: Patient comes in today for follow up of medical problems.  bp and ed visit for swelling.  Was seen in ed in February for swelling poss from medication  ? Generic and HCTZ she had been on the new generic with only 2 doses and noted her face became swollen her classes didn't fit in her tongue became itchy and her throat felt tight. She was seen in our office and sent to the emergency room given antihistamines and observed and her symptoms subsided. She declines steroids at that time and her symptoms have not recurred. The emergency room doctor considered that it could be an i in her to increase the end in the generic as she had done well on the brand for a couple years. Since that time no recurrence does have a history of allergies has been tested cannot use tree nuts. On reviewing the record amlodipine is on her med list but she never took another medicine since last time she was seen in hasn't checked her blood pressure readings.  Not on any med now      Bp hasnt checked on own. ROS: See pertinent positives and negatives per HPI.  Past Medical History  Diagnosis Date  . Allergy   . Asthma   . GERD (gastroesophageal reflux disease)   . Hypertension   . Positive TB test     Family History  Problem Relation Age of Onset  . Hypertension Mother   . Alzheimer's disease Mother   . Hypertension Father   . Prostate cancer Father   . HIV Brother   . Lupus Sister     History   Social History  . Marital Status: Divorced    Spouse Name: N/A    Number of Children: N/A  . Years of Education: N/A   Social History Main Topics  . Smoking status: Former Smoker -- 0.50 packs/day for 17 years    Types: Cigarettes    Quit date: 12/12/2010  . Smokeless tobacco: Never Used  . Alcohol Use: No  . Drug Use: No  . Sexually Active: None   Other Topics Concern  . 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 albuterol (PROAIR HFA) 108 (90 BASE) MCG/ACT inhaler Inhale 2 puffs into the lungs every 4 (four) hours as needed for wheezing.       Marland Kitchen amLODipine (NORVASC) 5 MG tablet Take 1 tablet (5 mg total) by mouth daily.  30 tablet  2  . cetirizine (ZYRTEC ALLERGY) 10 MG tablet Take 1 tablet (10 mg total) by mouth daily.  7 tablet  0  . fluticasone (FLONASE) 50 MCG/ACT nasal spray Place 2 sprays into the nose daily as needed for allergies.      . [DISCONTINUED] amLODipine (NORVASC) 5 MG tablet Take 1 tablet (5 mg total) by mouth daily.  90 tablet  0   No facility-administered encounter medications on file as of 01/25/2013.    EXAM:  BP 144/96  Pulse 69  Temp(Src) 98.4 F (36.9 C) (Oral)  Wt 140 lb (63.504 kg)  BMI 27.34 kg/m2  SpO2 98%  Body mass index is 27.34 kg/(m^2).  GENERAL: vitals reviewed and listed above, alert, oriented, appears  well hydrated and in no acute distress  HEENT: atraumatic, conjunctiva  clear, no obvious abnormalities on inspection of external nose and ears OP : no lesion edema or exudate  Skin no swelling or hives NECK: no obvious masses on inspection palpation   LUNGS: clear to auscultation bilaterally, no wheezes, rales or rhonchi, good air movement abd soft   CV: HRRR,  No g or m no clubbing cyanosis or  peripheral edema nl cap refill  bp recheck  MS: moves all extremities without noticeable focal  abnormality  PSYCH: pleasant and cooperative, no obvious depression or anxiety Ed visit reviewed  ASSESSMENT AND PLAN:  Discussed the following assessment and plan:  HYPERTENSION  Drug side effects, subsequent encounter  Allergic reaction caused by a drug - probably. Uncertain and if her side effect was an allergic reaction to the main ingredient or to the generic version in her.  However at this time as her other history of allergies would rather switched her groups. She never took the amlodipine and was sent in ascending today to begin side effects explained follow up in 4-6 weeks or as needed  She asked about vitamins today avoid extra medications that she could have a side effect from at this point vitamins are usually not needed unless there is underlying medical problem Or food intolerance. Encouraged her to get specific nutrients and vitamins and food. Can discuss this further during preventive visits. -Patient advised to return or notify health care team  if symptoms worsen or persist or new concerns arise.  Patient Instructions  At this time will  Change med altogether and take the amlodipine.    Amlodipine.    Once a day then TOV in about 4-6 weeks .   At this time do not want to use  The [facility] drug group for the reasons we discussed      Neta Mends. Kaylor Simenson M.D.

## 2013-03-01 ENCOUNTER — Ambulatory Visit: Payer: 59 | Admitting: Internal Medicine

## 2013-03-03 ENCOUNTER — Ambulatory Visit (INDEPENDENT_AMBULATORY_CARE_PROVIDER_SITE_OTHER): Payer: 59 | Admitting: Internal Medicine

## 2013-03-03 ENCOUNTER — Encounter: Payer: Self-pay | Admitting: Internal Medicine

## 2013-03-03 VITALS — BP 134/94 | HR 51 | Temp 97.6°F | Wt 138.0 lb

## 2013-03-03 DIAGNOSIS — T50905A Adverse effect of unspecified drugs, medicaments and biological substances, initial encounter: Secondary | ICD-10-CM

## 2013-03-03 DIAGNOSIS — T887XXA Unspecified adverse effect of drug or medicament, initial encounter: Secondary | ICD-10-CM

## 2013-03-03 DIAGNOSIS — J45909 Unspecified asthma, uncomplicated: Secondary | ICD-10-CM

## 2013-03-03 DIAGNOSIS — I10 Essential (primary) hypertension: Secondary | ICD-10-CM

## 2013-03-03 MED ORDER — TRIAMTERENE-HCTZ 37.5-25 MG PO TABS: 1.0000 | ORAL_TABLET | Freq: Every day | ORAL | Status: DC

## 2013-03-03 NOTE — Patient Instructions (Signed)
Can change bp medication  To diuretic in the short run     Stop amlodipine cause of sedation.      Get [place] the FMLA form .  For [place] to fill pout.

## 2013-03-03 NOTE — Progress Notes (Signed)
Chief Complaint  Patient presents with  . Follow-up    Has FMLA paperwork to be filled out.    HPI:  Fu bp management after having angioedema on last mediocation. She is taking amlodipine every day her blood pressures coming down some however she has concerns about side effects.  Taking now at night cause of drowsiness and not abating if sits too  long she falls asleep and feels Sleepy. Much of the time. No other side effects no new allergy hives angioedema  Has an FMLA form would like it filled out for this urine regard to asthma allergy. In the past it was for hypertension management. She did see the allergist one time did not have a great experience as it was very uncomfortable during the allergy testing she was getting itching and paresthesias over very uncomfortable during the testing. Suggested she do a 1 oral food trial in the office but she was fearful of at that time she said she would think about it. She was given an EpiPen has some questions about  when to use it. ROS: See pertinent positives and negatives per HPI. No current chest pain shortness of breath or syncope.  Past Medical History  Diagnosis Date  . Allergy   . Asthma   . GERD (gastroesophageal reflux disease)   . Hypertension   . Positive TB test   . 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   . TOBACCO USE 03/22/2009    Qualifier: Diagnosis of  By: Fabian Sharp MD, Neta Mends  Stopped Dec 12 2010      Family History  Problem Relation Age of Onset  . Hypertension Mother   . Alzheimer's disease Mother   . Hypertension Father   . Prostate cancer Father   . HIV Brother   . Lupus Sister     History   Social History  . Marital Status: Divorced    Spouse Name: N/A    Number of Children: N/A  . Years of Education: N/A   Social History Main Topics  . Smoking status: Former Smoker -- 0.50 packs/day for 17 years    Types: Cigarettes    Quit date: 12/12/2010  . Smokeless tobacco: Never Used  .  Alcohol Use: No  . Drug Use: No  . Sexually Active: None   Other Topics Concern  . 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 albuterol (PROAIR HFA) 108 (90 BASE) MCG/ACT inhaler Inhale 2 puffs into the lungs every 4 (four) hours as needed for wheezing.       . cetirizine (ZYRTEC ALLERGY) 10 MG tablet Take 1 tablet (10 mg total) by mouth daily.  7 tablet  0  . [DISCONTINUED] amLODipine (NORVASC) 5 MG tablet Take 1 tablet (5 mg total) by mouth daily.  30 tablet  2  . fluticasone (FLONASE) 50 MCG/ACT nasal spray Place 2 sprays into the nose daily as needed for allergies.      Marland Kitchen triamterene-hydrochlorothiazide (MAXZIDE-25) 37.5-25 MG per tablet Take 1 tablet by mouth daily.  30 tablet  3   No facility-administered encounter medications on file as of 03/03/2013.    EXAM:  BP 134/94  Pulse 51  Temp(Src) 97.6 F (36.4 C) (Oral)  Wt 138 lb (62.596 kg)  BMI  26.95 kg/m2  SpO2 97%  Body mass index is 26.95 kg/(m^2).  GENERAL: vitals reviewed and listed above, alert, oriented, appears well hydrated and in no acute distress  HEENT: atraumatic, conjunctiva  clear, no obvious abnormalities on inspection of external nose and ears OP : no lesion edema or exudate  Repeat blood pressure 134/84 NECK: no obvious masses on inspection palpation   LUNGS: clear to auscultation bilaterally, no wheezes, rales or rhonchi, good air movement  CV: HRRR, no clubbing cyanosis or  peripheral edema nl cap refill   MS: moves all extremities without noticeable focal  abnormality  PSYCH: pleasant and cooperative, no obvious depression or anxiety  ASSESSMENT AND PLAN:  Discussed the following assessment and plan:  HYPERTENSION - se of amlo try diuretic should bve able to take diuretic  arb and ace a problem.     ASTHMA  Drug side effects, initial encounter Although she had angioedema on an [facility] HCTZ drug I do think it was the [facility] component. And not the diuretic think it is safe to try a plain diuretic and followup at perhaps another calcium channel blocker if needed.  Discussed her asthma and allergy or asthma he is pretty quiescent her history of hives in angioedema are a little more concerning encouraged her to followup either with his allergist or another; discussed use of EpiPen. -Patient advised to return or notify health care team  if symptoms worsen or persist or new concerns arise.  Patient Instructions  Can change bp medication  To diuretic in the short run     Stop amlodipine cause of sedation.      Get [place] the FMLA form .  For [place] to fill pout.     Neta Mends. [name] M.D.

## 2013-04-26 ENCOUNTER — Ambulatory Visit: Payer: 59 | Admitting: Internal Medicine

## 2013-05-04 ENCOUNTER — Ambulatory Visit (INDEPENDENT_AMBULATORY_CARE_PROVIDER_SITE_OTHER): Payer: 59 | Admitting: Internal Medicine

## 2013-05-04 ENCOUNTER — Encounter: Payer: Self-pay | Admitting: Internal Medicine

## 2013-05-04 DIAGNOSIS — I10 Essential (primary) hypertension: Secondary | ICD-10-CM

## 2013-05-04 DIAGNOSIS — Z0279 Encounter for issue of other medical certificate: Secondary | ICD-10-CM

## 2013-05-04 NOTE — Progress Notes (Signed)
Document opened and reviewed for OV but pt NS  same day .

## 2013-06-02 ENCOUNTER — Encounter: Payer: Self-pay | Admitting: Internal Medicine

## 2013-06-02 ENCOUNTER — Ambulatory Visit (INDEPENDENT_AMBULATORY_CARE_PROVIDER_SITE_OTHER): Payer: 59 | Admitting: Internal Medicine

## 2013-06-02 VITALS — BP 140/86 | HR 62 | Temp 98.1°F | Wt 132.0 lb

## 2013-06-02 DIAGNOSIS — I10 Essential (primary) hypertension: Secondary | ICD-10-CM

## 2013-06-02 DIAGNOSIS — R110 Nausea: Secondary | ICD-10-CM

## 2013-06-02 DIAGNOSIS — R5381 Other malaise: Secondary | ICD-10-CM

## 2013-06-02 DIAGNOSIS — R5383 Other fatigue: Secondary | ICD-10-CM

## 2013-06-02 LAB — CBC WITH DIFFERENTIAL/PLATELET
Basophils Absolute: 0 10*3/uL (ref 0.0–0.1)
Basophils Relative: 0.5 % (ref 0.0–3.0)
Eosinophils Absolute: 0.2 10*3/uL (ref 0.0–0.7)
Eosinophils Relative: 3.9 % (ref 0.0–5.0)
HCT: 41.1 % (ref 36.0–46.0)
Hemoglobin: 13.3 g/dL (ref 12.0–15.0)
Lymphocytes Relative: 34.4 % (ref 12.0–46.0)
Lymphs Abs: 1.9 10*3/uL (ref 0.7–4.0)
MCHC: 32.4 g/dL (ref 30.0–36.0)
MCV: 86.9 fl (ref 78.0–100.0)
Monocytes Absolute: 0.4 10*3/uL (ref 0.1–1.0)
Monocytes Relative: 7.2 % (ref 3.0–12.0)
Neutro Abs: 2.9 10*3/uL (ref 1.4–7.7)
Neutrophils Relative %: 54 % (ref 43.0–77.0)
Platelets: 292 10*3/uL (ref 150.0–400.0)
RBC: 4.73 Mil/uL (ref 3.87–5.11)
RDW: 15.5 % — ABNORMAL HIGH (ref 11.5–14.6)
WBC: 5.4 10*3/uL (ref 4.5–10.5)

## 2013-06-02 LAB — POCT URINALYSIS DIP (MANUAL ENTRY)
Bilirubin, UA: NEGATIVE
Blood, UA: NEGATIVE
Glucose, UA: NEGATIVE
Ketones, POC UA: NEGATIVE
Leukocytes, UA: NEGATIVE
Nitrite, UA: NEGATIVE
Protein Ur, POC: 100
Spec Grav, UA: >=1.03
Urobilinogen, UA: 1
pH, UA: 5.5

## 2013-06-02 LAB — BASIC METABOLIC PANEL
BUN: 11 mg/dL (ref 6–23)
CO2: 28 mEq/L (ref 19–32)
Calcium: 9.6 mg/dL (ref 8.4–10.5)
Chloride: 103 mEq/L (ref 96–112)
Creatinine, Ser: 0.8 mg/dL (ref 0.4–1.2)
GFR: 100.59 mL/min (ref >60.00)
Glucose, Bld: 84 mg/dL (ref 70–99)
Potassium: 3.3 mEq/L — ABNORMAL LOW (ref 3.5–5.1)
Sodium: 138 mEq/L (ref 135–145)

## 2013-06-02 LAB — SEDIMENTATION RATE: Sed Rate: 7 mm/hr (ref 0–22)

## 2013-06-02 LAB — HEPATIC FUNCTION PANEL
ALT: 10 U/L (ref 0–35)
AST: 18 U/L (ref 0–37)
Albumin: 4.3 g/dL (ref 3.5–5.2)
Alkaline Phosphatase: 45 U/L (ref 39–117)
Bilirubin, Direct: 0 mg/dL (ref 0.0–0.3)
Total Bilirubin: 0.5 mg/dL (ref 0.3–1.2)
Total Protein: 7.5 g/dL (ref 6.0–8.3)

## 2013-06-02 LAB — T4, FREE: Free T4: 0.68 ng/dL (ref 0.60–1.60)

## 2013-06-02 LAB — TSH: TSH: 0.55 u[IU]/mL (ref 0.35–5.50)

## 2013-06-02 NOTE — Patient Instructions (Signed)
Uncertain cause but acts like a viral  Infection like mono  . Labs today and observe   And fu depending  on how you are doing.  ROV   In a week if not improving or as needed.

## 2013-06-02 NOTE — Progress Notes (Signed)
Chief Complaint  Patient presents with  . Fatigue    Started on Saturday.  Has slept for 2 days.  Feels no better.    HPI: Patient comes in today for SDA for  new problem evaluation. Last 2 days    Sleeping all the time . Onset perhaps this weekend starting to feel tired and then went to work 2 days ago Usually 4- 6 hours of sleep.  Sent home from work because of nausea   About 10 am and then  Slept  Until next day.   Now queasy  And  Decreased appetite but taking in fluids and juices and soups. No active vomiting diarrhea. No fever chills or UTI symptoms did have a severe headache last week but doesn't usually get these. No known exposures except to coworkers daughter had mono. No new meds   bp usually  134 range   No tobacco and no etoh.   ROS: See pertinent positives and negatives per HPI. No current chest pain shortness of breath hematuria flank pain neurologic symptoms.  Past Medical History  Diagnosis Date  . Allergy   . Asthma   . GERD (gastroesophageal reflux disease)   . Hypertension   . Positive TB test   . 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   . TOBACCO USE 03/22/2009    Qualifier: Diagnosis of  By: Fabian Sharp MD, Neta Mends  Stopped Dec 12 2010      Family History  Problem Relation Age of Onset  . Hypertension Mother   . Alzheimer's disease Mother   . Hypertension Father   . Prostate cancer Father   . HIV Brother   . Lupus Sister     History   Social History  . Marital Status: Divorced    Spouse Name: N/A    Number of Children: N/A  . Years of Education: N/A   Social History Main Topics  . Smoking status: Former Smoker -- 0.50 packs/day for 17 years    Types: Cigarettes    Quit date: 12/12/2010  . Smokeless tobacco: Never Used  . Alcohol Use: No  . Drug Use: No  . Sexually Active: None   Other Topics Concern  . 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 albuterol (PROAIR HFA) 108 (90 BASE) MCG/ACT inhaler Inhale 2 puffs into the lungs every 4 (four) hours as needed for wheezing.       . cetirizine (ZYRTEC ALLERGY) 10 MG tablet Take 1 tablet (10 mg total) by mouth daily.  7 tablet  0  . triamterene-hydrochlorothiazide (MAXZIDE-25) 37.5-25 MG per tablet Take 1 tablet by mouth daily.  30 tablet  3  . fluticasone (FLONASE) 50 MCG/ACT nasal spray Place 2 sprays into the nose daily as needed for allergies.       No facility-administered encounter medications on file as of 06/02/2013.    EXAM:  BP 140/86  Pulse 62  Temp(Src) 98.1 F (36.7 C) (Oral)  Wt 132 lb (59.875 kg)  BMI 25.78 kg/m2  SpO2 97%  Body mass index is 25.78 kg/(m^2).  GENERAL: vitals reviewed and listed above, alert, oriented, appears well hydrated and in no acute distress HEENT: atraumatic, conjunctiva  clear, no obvious abnormalities on inspection of external nose and ears  TMs clear OP : no lesion edema or exudate  NECK: no obvious masses on inspection palpation no adenopathy thyroid may be palpable LUNGS: clear to auscultation bilaterally, no wheezes, rales or rhonchi, good air movement CV: HRRR, no clubbing cyanosis or  peripheral edema nl cap refill  Abdomen soft without organomegaly guarding or rebound MS: moves all extremities without noticeable focal  abnormality PSYCH: pleasant and cooperative, no obvious depression or anxiety  ASSESSMENT AND PLAN:  Discussed the following assessment and plan:  Other malaise and fatigue - Plan: Basic metabolic panel, Hepatic function panel, CBC with Differential, TSH, T4, free, POCT urinalysis dipstick, Epstein-Barr virus VCA antibody panel, CMV IgM, HIV antibody, Sedimentation rate  Nausea alone - Plan: Basic metabolic panel, Hepatic function panel, CBC with Differential, TSH, T4, free, POCT urinalysis  dipstick, Epstein-Barr virus VCA antibody panel, CMV IgM, HIV antibody, Sedimentation rate  HYPERTENSION - Plan: Basic metabolic panel, Hepatic function panel, CBC with Differential, TSH, T4, free, POCT urinalysis dipstick, Epstein-Barr virus VCA antibody panel, CMV IgM, HIV antibody, Sedimentation rate Uncertain cause of her fairly sudden onset of sleepiness and fatigue. Associated with decreased appetite and mild nausea. She may have had sleep deprivation in the past but she doesn't feel that that's the cause of this persistent symptoms. Blood pressure is high normal probably not related. Denies depression or other obvious factors was exposed to mono and she didn't have it when she was a child. Check screening labs today and observe note for work to return on Monday if persistent progressive can do further evaluation. I don't think there are any signs of rheumatologic disease as her sister has lupus but if persistent problems we can do further evaluation -Patient advised to return or notify health care team  if symptoms worsen or persist or new concerns arise.  Patient Instructions  Uncertain cause but acts like a viral  Infection like mono  . Labs today and observe   And fu depending  on how you are doing.  ROV   In a week if not improving or as needed.    Neta Mends. Markian Glockner M.D.

## 2013-06-03 LAB — CMV IGM: CMV IgM: <8 AU/mL (ref <30.00)

## 2013-06-03 LAB — HIV ANTIBODY (ROUTINE TESTING W REFLEX): HIV: NONREACTIVE

## 2013-06-03 LAB — EPSTEIN-BARR VIRUS VCA ANTIBODY PANEL
EBV EA IgG: 8.2 U/mL (ref <9.0)
EBV NA IgG: 488 U/mL — ABNORMAL HIGH (ref <18.0)
EBV VCA IgG: 704 U/mL — ABNORMAL HIGH (ref <18.0)
EBV VCA IgM: <10 U/mL (ref <36.0)

## 2013-06-08 ENCOUNTER — Telehealth: Payer: Self-pay | Admitting: Internal Medicine

## 2013-06-08 NOTE — Telephone Encounter (Signed)
PT states that she is returning your call regarding her labs from 7/31. She states that she will be going to lunch today from 1:30-2:00. Please assist.

## 2013-06-08 NOTE — Telephone Encounter (Signed)
Do lab  In 2-3 weeks

## 2013-06-08 NOTE — Telephone Encounter (Signed)
Informed pt of low potassium.  She does not want to start any new medications at this time.  Says she is feeling much better.  Has started a lifestyle change.  She has stopped red meat.  Has increased water and leafy vegetables.  She will now increase potassium rich foods.  Do you still want to do lab work in 2 weeks.  Advise plan.  Thanks!!

## 2013-06-10 ENCOUNTER — Other Ambulatory Visit: Payer: Self-pay | Admitting: Family Medicine

## 2013-06-10 DIAGNOSIS — E876 Hypokalemia: Secondary | ICD-10-CM

## 2013-06-10 NOTE — Telephone Encounter (Signed)
Left detailed message on personally identified home/cell instructing the patient to call back for lab appointment in 2-3 weeks.  Orders placed in the system.

## 2013-07-02 ENCOUNTER — Encounter: Payer: Self-pay | Admitting: Internal Medicine

## 2013-07-02 ENCOUNTER — Ambulatory Visit (INDEPENDENT_AMBULATORY_CARE_PROVIDER_SITE_OTHER): Payer: 59 | Admitting: Internal Medicine

## 2013-07-02 VITALS — BP 144/92 | HR 61 | Temp 97.8°F | Wt 129.0 lb

## 2013-07-02 DIAGNOSIS — R519 Headache, unspecified: Secondary | ICD-10-CM | POA: Insufficient documentation

## 2013-07-02 DIAGNOSIS — R51 Headache: Secondary | ICD-10-CM

## 2013-07-02 DIAGNOSIS — R5381 Other malaise: Secondary | ICD-10-CM

## 2013-07-02 DIAGNOSIS — I10 Essential (primary) hypertension: Secondary | ICD-10-CM

## 2013-07-02 DIAGNOSIS — Z72820 Sleep deprivation: Secondary | ICD-10-CM

## 2013-07-02 MED ORDER — VERAPAMIL HCL ER 120 MG PO CP24: 120.0000 mg | ORAL_CAPSULE | Freq: Every day | ORAL | Status: DC

## 2013-07-02 NOTE — Patient Instructions (Signed)
Get more sleep at night add extra hour as possible .8 hours is optimum  Add on medication for bp at night . ROV in about  Month Consider other support as discussed.

## 2013-07-02 NOTE — Progress Notes (Signed)
Chief Complaint  Patient presents with  . Follow-up    Has had 3 bad headaches in the last month.  This is unusual for her.  Did not take any medication.     HPI: Patient comes in today for follow up of  multiple medical problems.  Since last visit sleepiness is slightly improved. Hx of 3 bad headaches in the pawst month   Had to go home from work on one. Worse over the day .  Laid down and had rest.  Characterized as throbbing usually one-sided plus minus vision changes. Hx of migraines years ago.  Pinched nerve in neck   And then ok recently . No hot flushes  .    No tobacco  Dec caffiene    Sweet tea . Dec coffee also.  25 oz  Water per day.  Hasn't checked blood pressure readings..  taking diuretic pill every day. 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 5 days a week gets up at 6:30 on Saturdays no sleep apnea snoring. Up to stress at work but not depressed. Doesn't feel she can give up her studying. Asks about time off from work to feel better. ROS: See pertinent positives and negatives per HPI.  Past Medical History  Diagnosis Date  . Allergy   . Asthma   . GERD (gastroesophageal reflux disease)   . Hypertension   . Positive TB test   . 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   . TOBACCO USE 03/22/2009    Qualifier: Diagnosis of  By: Fabian Sharp MD, Neta Mends  Stopped Dec 12 2010      Family History  Problem Relation Age of Onset  . Hypertension Mother   . Alzheimer's disease Mother   . Hypertension Father   . Prostate cancer Father   . HIV Brother   . Lupus Sister     History   Social History  . Marital Status: Divorced    Spouse Name: N/A    Number of Children: N/A  . Years of Education: N/A   Social History Main Topics  . Smoking status: Former Smoker -- 0.50 packs/day for 17 years    Types: Cigarettes    Quit date: 12/12/2010  . Smokeless tobacco: Never Used  . Alcohol Use: No   . Drug Use: No  . Sexual Activity: None   Other Topics Concern  . 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 albuterol (PROAIR HFA) 108 (90 BASE) MCG/ACT inhaler Inhale 2 puffs into the lungs every 4 (four) hours as needed for wheezing.       . cetirizine (ZYRTEC ALLERGY) 10 MG tablet Take 1 tablet (10 mg total) by mouth daily.  7 tablet  0  . triamterene-hydrochlorothiazide (MAXZIDE-25) 37.5-25 MG per tablet Take 1 tablet by mouth daily.  30 tablet  3  . fluticasone (FLONASE) 50 MCG/ACT nasal spray Place 2 sprays into the nose daily as needed for allergies.      . verapamil (VERELAN PM) 120 MG 24 hr capsule Take 1 capsule (120 mg total) by mouth at bedtime.  30 capsule  1   No facility-administered encounter medications on file as of 07/02/2013.  EXAM:  BP 144/92  Pulse 61  Temp(Src) 97.8 F (36.6 C) (Oral)  Wt 129 lb (58.514 kg)  BMI 25.19 kg/m2  SpO2 99%  Body mass index is 25.19 kg/(m^2).  GENERAL: vitals reviewed and listed above, alert, oriented, appears well hydrated and in no acute distress looks a bit tired otherwise healthy HEENT: atraumatic, conjunctiva  clear, no obvious abnormalities on inspection of external nose and ears EOMs are normal OP : no lesion edema or exudate  NECK: no obvious masses on inspection palpation no adenopathy Skin left acromial area with a 1 cm soft subcutaneous lipomatous feeling nodule. She points that out to me nontender CV: HRRR, no clubbing cyanosis or  peripheral edema nl cap refill  Pressure repeated 144 and 148/90  94 MS: moves all extremities without noticeable focal  abnormality PSYCH: pleasant and cooperative, looks tired denies depression Lab Results  Component Value Date   WBC 5.4 06/02/2013   HGB 13.3 06/02/2013   HCT 41.1  06/02/2013   PLT 292.0 06/02/2013   GLUCOSE 84 06/02/2013   CHOL 177 07/24/2011   TRIG 61.0 07/24/2011   HDL 67.10 07/24/2011   LDLCALC 98 07/24/2011   ALT 10 06/02/2013   AST 18 06/02/2013   NA 138 06/02/2013   K 3.3* 06/02/2013   CL 103 06/02/2013   CREATININE 0.8 06/02/2013   BUN 11 06/02/2013   CO2 28 06/02/2013   TSH 0.55 06/02/2013   MICROALBUR 0.7 05/30/2009    ASSESSMENT AND PLAN:  Discussed the following assessment and plan:  HYPERTENSION - add verapamil low dose at night  may also help with has.   Other malaise and fatigue  Sleep deprivation - causing some burn out also disc choices and health sleep etc.   Headache - episodic sound migrainous recent onset incrase with past hx of same years ago. poss triggers hormonal? sleep etc. follow no other alarm features at this time. Discussion in counseling about above we discussed the possibility of her request of stopping work for 2 weeks to try to feel better but there is a risk benefit of this to if she doesn't increase her sleep. It may be persistent. However this may be an option she may want to discuss with a counselor about doing too much potential burnout etc. I don't think she is depressed. -Patient advised to return or notify health care team  if symptoms worsen or persist or new concerns arise.  Patient Instructions  Get more sleep at night add extra hour as possible .8 hours is optimum  Add on medication for bp at night . ROV in about  Month Consider other support as discussed.      Neta Mends. London Nonaka M.D. After patient left noted  She is due for repeat bmp and magnesium for k o 3.3 contact her about repeat. Doubt if sx related to this  Today.

## 2013-07-12 ENCOUNTER — Ambulatory Visit (INDEPENDENT_AMBULATORY_CARE_PROVIDER_SITE_OTHER): Payer: 59 | Admitting: Internal Medicine

## 2013-07-12 ENCOUNTER — Encounter: Payer: Self-pay | Admitting: Internal Medicine

## 2013-07-12 VITALS — BP 148/100 | HR 55 | Temp 98.0°F | Wt 129.0 lb

## 2013-07-12 DIAGNOSIS — J329 Chronic sinusitis, unspecified: Secondary | ICD-10-CM

## 2013-07-12 DIAGNOSIS — J069 Acute upper respiratory infection, unspecified: Secondary | ICD-10-CM

## 2013-07-12 DIAGNOSIS — H1031 Unspecified acute conjunctivitis, right eye: Secondary | ICD-10-CM

## 2013-07-12 DIAGNOSIS — H1030 Unspecified acute conjunctivitis, unspecified eye: Secondary | ICD-10-CM | POA: Insufficient documentation

## 2013-07-12 MED ORDER — AMOXICILLIN 500 MG PO CAPS: 500.0000 mg | ORAL_CAPSULE | Freq: Three times a day (TID) | ORAL | Status: DC

## 2013-07-12 MED ORDER — POLYMYXIN B-TRIMETHOPRIM 10000-0.1 UNIT/ML-% OP SOLN: 1.0000 [drp] | OPHTHALMIC | Status: DC

## 2013-07-12 NOTE — Patient Instructions (Signed)
Warm ocmpresses and euye drops  Return to work after 24 hours on drops.  This still could be adenovirus   . And run its course.  If face pain on right continues then add antibiotic for sinusitis.  Otherwise warm liquids  Fluids tec. Chest ex am is clear today .

## 2013-07-12 NOTE — Progress Notes (Signed)
Chief Complaint  Patient presents with  . red eyes    itchy   . Cough    HPI:  Patient comes in today for SDA for  new problem evaluation. Onset days of eye s red right itchy and swo;llen no fever .   Eye sx for 1 days  And congestion  In 1-2 days and then cough only at night  Then severe mucous in am  ROS: See pertinent positives and negatives per HPI. phlegm this am  No fever chills or sob.  Daughter had red eye for 1 days then better .  Some right face pain pressure on ?ing.  No recent allergy  flare but has allergy  No recent use of flonase   Past Medical History  Diagnosis Date  . Allergy   . Asthma   . GERD (gastroesophageal reflux disease)   . Hypertension   . Positive TB test   . 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   . TOBACCO USE 03/22/2009    Qualifier: Diagnosis of  By: Fabian Sharp MD, Neta Mends  Stopped Dec 12 2010      Family History  Problem Relation Age of Onset  . Hypertension Mother   . Alzheimer's disease Mother   . Hypertension Father   . Prostate cancer Father   . HIV Brother   . Lupus Sister     History   Social History  . Marital Status: Divorced    Spouse Name: N/A    Number of Children: N/A  . Years of Education: N/A   Social History Main Topics  . Smoking status: Former Smoker -- 0.50 packs/day for 17 years    Types: Cigarettes    Quit date: 12/12/2010  . Smokeless tobacco: Never Used  . Alcohol Use: No  . Drug Use: No  . Sexual Activity: None   Other Topics Concern  . 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 albuterol (PROAIR HFA) 108 (90 BASE) MCG/ACT inhaler Inhale 2 puffs into the lungs every 4 (four) hours as needed for wheezing.       . cetirizine (ZYRTEC ALLERGY) 10 MG tablet Take 1 tablet (10 mg total) by mouth  daily.  7 tablet  0  . triamterene-hydrochlorothiazide (MAXZIDE-25) 37.5-25 MG per tablet Take 1 tablet by mouth daily.  30 tablet  3  . verapamil (VERELAN PM) 120 MG 24 hr capsule Take 1 capsule (120 mg total) by mouth at bedtime.  30 capsule  1  . amoxicillin (AMOXIL) 500 MG capsule Take 1 capsule (500 mg total) by mouth 3 (three) times daily. For sinusitis  30 capsule  0  . fluticasone (FLONASE) 50 MCG/ACT nasal spray Place 2 sprays into the nose daily as needed for allergies.      Marland Kitchen trimethoprim-polymyxin b (POLYTRIM) ophthalmic solution Place 1 drop into the right eye every 4 (four) hours.  10 mL  0   No facility-administered encounter medications on file as of 07/12/2013.    EXAM:  BP 148/100  Pulse 55  Temp(Src) 98 F (36.7 C) (Oral)  Wt 129 lb (58.514 kg)  BMI 25.19 kg/m2  SpO2 98%  Body mass index is 25.19 kg/(m^2).  GENERAL: vitals reviewed and listed above, alert, oriented, appears well hydrated and  in no acute distress  Obvious red right eye more than left  Mild swelling o lid  Mucoid dc  Looks allergic  HEENT: atraumatic, conjunctiva  2+ red , no obvious abnormalities on inspection of external nose and ears inc turbinates and congestion right maxilla mildly tenders OP : no lesion edema or exudate mildy hoarse  NECK: no obvious masses on inspection palpation  LUNGS: clear to auscultation bilaterally, no wheezes, rales or rhonchi, good air movement CV: HRRR, no clubbing cyanosis or  peripheral edema nl cap refill  PSYCH: pleasant and cooperative, no obvious depression or anxiety  ASSESSMENT AND PLAN:  Discussed the following assessment and plan:  Conjunctivitis, acute, right  URI, acute  Sinusitis   -Patient advised to return or notify health care team  if symptoms worsen or persist or new concerns arise.  Patient Instructions  Warm ocmpresses and euye drops  Return to work after 24 hours on drops.  This still could be adenovirus   . And run its course.  If face  pain on right continues then add antibiotic for sinusitis.  Otherwise warm liquids  Fluids tec. Chest ex am is clear today .     Neta Mends. Frederik Standley M.D.

## 2013-07-26 ENCOUNTER — Other Ambulatory Visit: Payer: 59

## 2013-08-03 ENCOUNTER — Encounter: Payer: 59 | Admitting: Internal Medicine

## 2013-08-03 DIAGNOSIS — Z0289 Encounter for other administrative examinations: Secondary | ICD-10-CM

## 2013-08-05 NOTE — Progress Notes (Signed)
  Subjective:    Patient ID: [name], female    DOB: Mar 17, 1966, 47 y.o.   MRN: [id number]  HPI   Document opened and reviewed for OV but appt  ? Ns of cx same day .  Review of Systems     Objective:   Physical Exam        Assessment & Plan:

## 2014-09-07 ENCOUNTER — Encounter: Payer: Self-pay | Admitting: Internal Medicine

## 2014-09-07 ENCOUNTER — Ambulatory Visit (INDEPENDENT_AMBULATORY_CARE_PROVIDER_SITE_OTHER): Payer: 59 | Admitting: Internal Medicine

## 2014-09-07 VITALS — BP 140/104 | Temp 98.0°F | Wt 122.5 lb

## 2014-09-07 DIAGNOSIS — R030 Elevated blood-pressure reading, without diagnosis of hypertension: Secondary | ICD-10-CM

## 2014-09-07 DIAGNOSIS — R1013 Epigastric pain: Secondary | ICD-10-CM

## 2014-09-07 DIAGNOSIS — IMO0001 Reserved for inherently not codable concepts without codable children: Secondary | ICD-10-CM

## 2014-09-07 LAB — AMYLASE: Amylase: 85 U/L (ref 27–131)

## 2014-09-07 LAB — HEPATIC FUNCTION PANEL
ALT: 9 U/L (ref 0–35)
AST: 18 U/L (ref 0–37)
Albumin: 3.6 g/dL (ref 3.5–5.2)
Alkaline Phosphatase: 43 U/L (ref 39–117)
Bilirubin, Direct: 0.1 mg/dL (ref 0.0–0.3)
Total Bilirubin: 0.6 mg/dL (ref 0.2–1.2)
Total Protein: 7 g/dL (ref 6.0–8.3)

## 2014-09-07 LAB — BASIC METABOLIC PANEL
BUN: 18 mg/dL (ref 6–23)
CO2: 24 mEq/L (ref 19–32)
Calcium: 9.2 mg/dL (ref 8.4–10.5)
Chloride: 105 mEq/L (ref 96–112)
Creatinine, Ser: 1 mg/dL (ref 0.4–1.2)
GFR: 80.87 mL/min (ref >60.00)
Glucose, Bld: 80 mg/dL (ref 70–99)
Potassium: 3.6 mEq/L (ref 3.5–5.1)
Sodium: 139 mEq/L (ref 135–145)

## 2014-09-07 LAB — CBC WITH DIFFERENTIAL/PLATELET
Basophils Absolute: 0 10*3/uL (ref 0.0–0.1)
Basophils Relative: 1.1 % (ref 0.0–3.0)
Eosinophils Absolute: 0.2 10*3/uL (ref 0.0–0.7)
Eosinophils Relative: 4.7 % (ref 0.0–5.0)
HCT: 39.5 % (ref 36.0–46.0)
Hemoglobin: 12.7 g/dL (ref 12.0–15.0)
Lymphocytes Relative: 46.1 % — ABNORMAL HIGH (ref 12.0–46.0)
Lymphs Abs: 2 10*3/uL (ref 0.7–4.0)
MCHC: 32.2 g/dL (ref 30.0–36.0)
MCV: 86.7 fl (ref 78.0–100.0)
Monocytes Absolute: 0.3 10*3/uL (ref 0.1–1.0)
Monocytes Relative: 5.9 % (ref 3.0–12.0)
Neutro Abs: 1.8 10*3/uL (ref 1.4–7.7)
Neutrophils Relative %: 42.2 % — ABNORMAL LOW (ref 43.0–77.0)
Platelets: 256 10*3/uL (ref 150.0–400.0)
RBC: 4.56 Mil/uL (ref 3.87–5.11)
RDW: 14.7 % (ref 11.5–15.5)
WBC: 4.3 10*3/uL (ref 4.0–10.5)

## 2014-09-07 LAB — LIPASE: Lipase: 29 U/L (ref 11.0–59.0)

## 2014-09-07 NOTE — Progress Notes (Signed)
Pre visit review using our clinic review tool, if applicable. No additional management support is needed unless otherwise documented below in the visit note.   Chief Complaint  Patient presents with  . Nausea    Pt states she feels like she has ball of needles in her stomach when she eats.  . Abdominal Pain  . Fatigue    HPI: Patient [name]  comes in today for SDA for  new problem evaluation. Onset 4 days ago  Never had this kind of pain this time  After eating sx  Nausea big ball and hurts going down.  No constipation diarrhea  About 30 moinutes  wiats to pass. Tired .  BP has been noremal  Between 135 range  Over 80 -90  LSI weight loss exercise Check readings about every 3 weeks . Eating pretty healthy . Had salad. Oatmeal .  Water now  Stopped the coffe. No vomiting  No meds  For this. noa dvil aleve asa   ROS: See pertinent positives and negatives per HPI. No fever   Past Medical History  Diagnosis Date  . Allergy   . Asthma   . GERD (gastroesophageal reflux disease)   . Hypertension   . Positive TB test   . 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   . TOBACCO USE 03/22/2009    Qualifier: Diagnosis of  By: Regis Bill MD, [name]  Stopped Dec 12 2010      Family History  Problem Relation Age of Onset  . Hypertension Mother   . Alzheimer's disease Mother   . Hypertension Father   . Prostate cancer Father   . HIV Brother   . Lupus Sister     History   Social History  . Marital Status: Divorced    Spouse Name: N/A    Number of Children: N/A  . Years of Education: N/A   Social History Main Topics  . Smoking status: Former Smoker -- 0.50 packs/day for 17 years    Types: Cigarettes    Quit date: 12/12/2010  . Smokeless tobacco: Never Used  . Alcohol Use: No  . Drug Use: No  . Sexual Activity: None   Other Topics Concern  . None   Social History Narrative   Divorced    Tobacco since age 71 stopped feb 2012   Works  [employer], broadcasting/film/video.Patient account REP    Outpatient Encounter Prescriptions as of 09/07/2014  Medication Sig  . albuterol (PROAIR HFA) 108 (90 BASE) MCG/ACT inhaler Inhale 2 puffs into the lungs every 4 (four) hours as needed for wheezing.   . fluticasone (FLONASE) 50 MCG/ACT nasal spray Place 2 sprays into the nose daily as needed for allergies.  . [DISCONTINUED] amoxicillin (AMOXIL) 500 MG capsule Take 1 capsule (500 mg total) by mouth 3 (three) times daily. For sinusitis  . [DISCONTINUED] cetirizine (ZYRTEC ALLERGY) 10 MG tablet Take 1 tablet (10 mg total) by mouth daily.  . [DISCONTINUED] triamterene-hydrochlorothiazide (MAXZIDE-25) 37.5-25 MG per tablet Take 1 tablet by mouth daily.  . [DISCONTINUED] trimethoprim-polymyxin b (POLYTRIM) ophthalmic solution Place 1 drop into the right eye every 4 (four) hours.  . [DISCONTINUED] verapamil (VERELAN PM) 120 MG 24 hr capsule Take 1 capsule (120 mg total) by mouth at bedtime.    EXAM:  BP 140/104 mmHg  Temp(Src) 98 F (36.7 C) (Oral)  Wt 122 lb 8 oz (55.566 kg)  Body mass index is 23.92 kg/(m^2).  GENERAL: vitals  reviewed and listed above, alert, oriented, appears well hydrated and in no acute distress HEENT: atraumatic, conjunctiva  clear, no obvious abnormalities on inspection of external nose and ears OP : no lesion edema or exudate  NECK: no obvious masses on inspection palpation  LUNGS: clear to auscultation bilaterally, no wheezes, rales or rhonchi, good air movement ABD epigastric tenderness no g or r  No masses noted bs nl. CV: HRRR, no clubbing cyanosis or  peripheral edema nl cap refill  MS: moves all extremities without noticeable focal  abnormality PSYCH: pleasant and cooperative, no obvious depression or anxiety Lab Results  Component Value Date   WBC 4.3 09/07/2014   HGB 12.7 09/07/2014   HCT 39.5 09/07/2014   PLT 256.0 09/07/2014   GLUCOSE 80 09/07/2014   CHOL 177 07/24/2011   TRIG 61.0 07/24/2011   HDL 67.10  07/24/2011   LDLCALC 98 07/24/2011   ALT 9 09/07/2014   AST 18 09/07/2014   NA 139 09/07/2014   K 3.6 09/07/2014   CL 105 09/07/2014   CREATININE 1.0 09/07/2014   BUN 18 09/07/2014   CO2 24 09/07/2014   TSH 0.55 06/02/2013   MICROALBUR 0.7 05/30/2009    ASSESSMENT AND PLAN:  Discussed the following assessment and plan:  Abdominal pain, epigastric - ? acute gastritis uncertain cause  allergic to meds ok to use gaviscon fu if not better in2 weeks or alarm sx labs today  - Plan: CBC with Differential, Hepatic function panel, Basic metabolic panel, Lipase, Amylase Cont to monitor   BP  recheck if elevated  -Patient advised to return or notify health care team  if symptoms worsen ,persist or new concerns arise.  Patient Instructions  Ok to take gaviscon as needed pre meal.   This could be a gastritis that will resolve on its own .over a week or so    Avoid asa products and nsaid .  Labs today  . If   persistent or progressive plan further evaluation.  rov in 2 weeks of not gone or improved a lot      [employer]. [name] M.D.

## 2014-09-07 NOTE — Patient Instructions (Addendum)
Ok to take gaviscon as needed pre meal.   This could be a gastritis that will resolve on its own .over a week or so    Avoid asa products and nsaid .  Labs today  . If   persistent or progressive plan further evaluation.  rov in 2 weeks of not gone or improved a lot

## 2014-09-08 ENCOUNTER — Telehealth: Payer: Self-pay | Admitting: Internal Medicine

## 2014-09-08 ENCOUNTER — Encounter: Payer: Self-pay | Admitting: Family Medicine

## 2014-09-08 ENCOUNTER — Ambulatory Visit (INDEPENDENT_AMBULATORY_CARE_PROVIDER_SITE_OTHER): Payer: 59 | Admitting: Family Medicine

## 2014-09-08 VITALS — BP 100/72 | HR 71 | Temp 97.8°F | Ht 60.0 in | Wt 122.3 lb

## 2014-09-08 DIAGNOSIS — R109 Unspecified abdominal pain: Secondary | ICD-10-CM

## 2014-09-08 DIAGNOSIS — R110 Nausea: Secondary | ICD-10-CM

## 2014-09-08 MED ORDER — ONDANSETRON HCL 4 MG PO TABS: 4.0000 mg | ORAL_TABLET | Freq: Three times a day (TID) | ORAL | Status: DC | PRN

## 2014-09-08 NOTE — Telephone Encounter (Signed)
Spoke to [place] at [facility] who will give information to one of the nurses for further advise.  Nurse to call the pt.

## 2014-09-08 NOTE — Telephone Encounter (Signed)
Patient Information: Caller Name: Cherilynn Phone: [phone] Patient: [name], [name] Gender: Female DOB: [birth year] Age: 48 Years PCP: Shanon Ace [facility]) Pregnant: No  Office Follow Up: Does the office need to follow up with this patient?: No Instructions For The Office: N/A   Symptoms Reason For Call & Symptoms: Patient calling seen by PCP yesterday 09/07/14.  Having nausea, stomach pains, feeling like food is in a "ball" and "spiked".  She states she recently eaten a small serving of home fries, grapes and toast at 1230 today and states she feels as if she has a "cinder block" in her stomach.  Patient did have an episode of nausea 20 minutes after eating that she could feel her food rise into her throat but never vomited. Was told to try Gaviscon before eating, but was unable to afford this as she does not get paid until tomorrow.  States stomach pain is worst symptom (worse than before), located in center of abdomen at belly button.  Rates 6/10. Reviewed Health History In EMR: Yes Reviewed Medications In EMR: Yes Reviewed Allergies In EMR: Yes Reviewed Surgeries / Procedures: Yes Date of Onset of Symptoms: 09/04/2014 OB / GYN: LMP: Unknown  Guideline(s) Used: Abdominal Pain - Female  Disposition Per Guideline:   See Today in Office  Reason For Disposition Reached:   Moderate or mild pain that comes and goes (cramps) lasts > 24 hours  Advice Given: N/A  Patient Will Follow Care Advice: YES  Appointment Scheduled: 09/08/2014 15:45:00 Appointment Scheduled Provider: Maudie Mercury (TEXT 1st, after 20 mins can call), Jarrett Soho [facility])

## 2014-09-08 NOTE — Patient Instructions (Signed)
-  try the zofran from [employer] - it may be a little cheaper for nausea  -if severe pain seek emergency care immediately  -I will send my note to Dr. Regis Bill per your request

## 2014-09-08 NOTE — Progress Notes (Signed)
Pre visit review using our clinic review tool, if applicable. No additional management support is needed unless otherwise documented below in the visit note. 

## 2014-09-08 NOTE — Telephone Encounter (Signed)
Caller: Poppi/Patient; Phone: [phone] [phone]; Reason for Call: Patient was seen in office on 09/07/14 for nausea, abdominal pain.  She reports that she has not been able to follow the instructions given in the office.  She does not get paid until 09/09/14, so she did not have the money to get the OTC medications that were suggested.  Reports symptoms are getting worse.  No difficulty breathing or swallowing at this time.  No episodes of vomiting, but she does have severe nausea after eating.  She would like to know if there is anything else she can do at this time or if she needs to come back to the office.

## 2014-09-08 NOTE — Progress Notes (Signed)
HPI:  Acute visit for:  Nausea: -saw PCP for this yesterday and undergoing workup (labs reviewed and normal - CBC, CMP, lipase, amylase) -reviewed notes - for nausea and stomach feels balled up, abd pain after eating, hx GERD, some reflux after eating today - PCP advised labs, gaviscon as needed before meals, avoidance of asa and nsaids, f/u in 2 weeks -she reports called [facility] for recs for something else to take as can't afford the gaviscon and told to come to office -denies: vomiting, hematemesis, diarrhea, constipation, melena, hematochezia, persistent focal abd pain ROS: See pertinent positives and negatives per HPI. -hx GERD, on medication in the past, none now -daughter with symptoms too, but thinks due to her pregnanacy  Past Medical History  Diagnosis Date  . Allergy   . Asthma   . GERD (gastroesophageal reflux disease)   . Hypertension   . Positive TB test   . 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   . TOBACCO USE 03/22/2009    Qualifier: Diagnosis of  By: Regis Bill MD, [name]  Stopped Dec 12 2010      Past Surgical History  Procedure Laterality Date  . Cholecystectomy    . Abdominal hysterectomy    . Tubal ligation    . Panendoscopy    . Bunionectomy      both feet  . Cystectomy      left shoulder  . Breast surgery      cyst from left breast  . Uterine ablastion      and polyps removed    Family History  Problem Relation Age of Onset  . Hypertension Mother   . Alzheimer's disease Mother   . Hypertension Father   . Prostate cancer Father   . HIV Brother   . Lupus Sister     History   Social History  . Marital Status: Divorced    Spouse Name: N/A    Number of Children: N/A  . Years of Education: N/A   Social History Main Topics  . Smoking status: Former Smoker -- 0.50 packs/day for 17 years    Types: Cigarettes    Quit date: 12/12/2010  . Smokeless tobacco: Never Used  . Alcohol Use: No  . Drug Use: No  . Sexual Activity: None    Other Topics Concern  . None   Social History Narrative   Divorced    Tobacco since age 11 stopped feb 2012   Works  [employer], broadcasting/film/video.Patient account REP    Current outpatient prescriptions: albuterol (PROAIR HFA) 108 (90 BASE) MCG/ACT inhaler, Inhale 2 puffs into the lungs every 4 (four) hours as needed for wheezing. , Disp: , Rfl: ;  fluticasone (FLONASE) 50 MCG/ACT nasal spray, Place 2 sprays into the nose daily as needed for allergies., Disp: , Rfl:  ondansetron (ZOFRAN) 4 MG tablet, Take 1 tablet (4 mg total) by mouth every 8 (eight) hours as needed for nausea or vomiting., Disp: 20 tablet, Rfl: 0  EXAM:  Filed Vitals:   09/08/14 1552  BP: 100/72  Pulse: 71  Temp: 97.8 F (36.6 C)    Body mass index is 23.89 kg/(m^2).  GENERAL: vitals reviewed and listed above, alert, oriented, appears well hydrated and in no acute distress  HEENT: atraumatic, conjunttiva clear, no obvious abnormalities on inspection of external nose and ears  NECK: no obvious masses on inspection  LUNGS: clear to auscultation bilaterally, no wheezes, rales or rhonchi, good air movement  CV:  HRRR, no peripheral edema  ABD: BS+, soft, difuse TTP, no rebound or guarding  MS: moves all extremities without noticeable abnormality  PSYCH: pleasant and cooperative, no obvious depression or anxiety  ASSESSMENT AND PLAN:  Discussed the following assessment and plan:  Nausea without vomiting - Plan: ondansetron (ZOFRAN) 4 MG tablet  Abdominal pain, unspecified abdominal location  -we discussed possible serious and likely etiologies, workup and treatment, treatment risks and return precautions - likely gastritis, GERD, less likely PUD, GBD, appendicitis but offered and discussed workup/tx for these -she prefers I discuss with PCP before doing any of this reports "she knows how I am" and declined -after this discussion, [place] opted for trial zofran for nausea, will try to purchase the  gaviscon, may try restarting low dose PPI for 1 month with paycheck tomorro -follow up advised with PCP as instructed and as needed -of course, we advised Idil  to return or notify a doctor immediately if symptoms worsen or persist or new concerns arise. ED if severe pain, inability to tolerate PO fluid, other severe or alarming symptoms.   Patient Instructions  -try the zofran from [facility] [facility] - it may be a little cheaper for nausea  -if severe pain seek emergency care immediately  -I will send my note to Dr. Regis Bill per your request     Lucretia Kern.

## 2014-09-08 NOTE — Telephone Encounter (Signed)
Noted  

## 2014-09-09 NOTE — Telephone Encounter (Signed)
Pt seen by Dr. Maudie Mercury on 09/08/14

## 2014-09-12 ENCOUNTER — Telehealth: Payer: Self-pay | Admitting: Internal Medicine

## 2014-09-12 NOTE — Telephone Encounter (Signed)
Letter sent by fax.  Pt notified and a successful transmission noticed received.

## 2014-09-12 NOTE — Telephone Encounter (Signed)
Pt needs a note faxed to her work stating she was in our office on 11/5 and 11/4. Please fax to [phone].  Attn L[place]

## 2014-09-22 ENCOUNTER — Encounter: Payer: 59 | Admitting: Internal Medicine

## 2014-09-22 ENCOUNTER — Encounter: Payer: Self-pay | Admitting: Internal Medicine

## 2014-09-22 NOTE — Progress Notes (Signed)
Document opened and reviewed for OV but appt  NS same day .   

## 2015-05-09 ENCOUNTER — Encounter (HOSPITAL_COMMUNITY): Payer: Self-pay | Admitting: Nurse Practitioner

## 2015-05-09 ENCOUNTER — Emergency Department (HOSPITAL_COMMUNITY)
Admission: EM | Admit: 2015-05-09 | Discharge: 2015-05-10 | Disposition: A | Discharge status: Home / Self Care | Payer: 59 | Attending: Emergency Medicine | Admitting: Emergency Medicine

## 2015-05-09 ENCOUNTER — Emergency Department (HOSPITAL_COMMUNITY): Payer: 59

## 2015-05-09 DIAGNOSIS — Z87891 Personal history of nicotine dependence: Secondary | ICD-10-CM | POA: Insufficient documentation

## 2015-05-09 DIAGNOSIS — Z8611 Personal history of tuberculosis: Secondary | ICD-10-CM | POA: Insufficient documentation

## 2015-05-09 DIAGNOSIS — Z79899 Other long term (current) drug therapy: Secondary | ICD-10-CM | POA: Diagnosis not present

## 2015-05-09 DIAGNOSIS — J45909 Unspecified asthma, uncomplicated: Secondary | ICD-10-CM | POA: Insufficient documentation

## 2015-05-09 DIAGNOSIS — Y9368 Activity, volleyball (beach) (court): Secondary | ICD-10-CM | POA: Diagnosis not present

## 2015-05-09 DIAGNOSIS — Z8719 Personal history of other diseases of the digestive system: Secondary | ICD-10-CM | POA: Diagnosis not present

## 2015-05-09 DIAGNOSIS — I10 Essential (primary) hypertension: Secondary | ICD-10-CM | POA: Diagnosis not present

## 2015-05-09 DIAGNOSIS — Y999 Unspecified external cause status: Secondary | ICD-10-CM | POA: Diagnosis not present

## 2015-05-09 DIAGNOSIS — Y92832 Beach as the place of occurrence of the external cause: Secondary | ICD-10-CM | POA: Diagnosis not present

## 2015-05-09 DIAGNOSIS — X58XXXA Exposure to other specified factors, initial encounter: Secondary | ICD-10-CM | POA: Insufficient documentation

## 2015-05-09 DIAGNOSIS — S6991XA Unspecified injury of right wrist, hand and finger(s), initial encounter: Secondary | ICD-10-CM | POA: Diagnosis present

## 2015-05-09 DIAGNOSIS — S63501A Unspecified sprain of right wrist, initial encounter: Secondary | ICD-10-CM | POA: Diagnosis not present

## 2015-05-09 NOTE — ED Notes (Signed)
Pt is c/o right wrist pain, states she injured it [place]. PMS assessment unremarkable. Rates pain 5/10

## 2015-05-09 NOTE — ED Provider Notes (Signed)
CSN: [id number]     Arrival date & time 05/09/15  2246 History  This chart was scribed for Antonietta Breach, PA-C, working with [place], DO by Julien Nordmann, ED Scribe. This patient was seen in room WTR7/WTR7 and the patient's care was started at 11:58 PM.    Chief Complaint  Patient presents with  . Wrist Pain     The history is provided by the patient. No language interpreter was used.   HPI Comments: [name] is a 49 y.o. female who presents to the Emergency Department complaining of constant, gradual worsening right wrist pain onset one week ago. She states the pain is a throbbing sensation. She notes that today the pain started radiating to her right arm. Pt injured her right wrist while playing [place]ball at the beach one week ago. She notes movement, pushing, and pulling makes the pain worse. She has taken OTC ibuprofen and used heat compresses to alleviate the pain with relief. She denies loss of sensation in fingers and prior injury to wrist. Pt is right hand dominant.  Past Medical History  Diagnosis Date  . Allergy   . Asthma   . GERD (gastroesophageal reflux disease)   . Hypertension   . Positive TB test   . 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   . TOBACCO USE 03/22/2009    Qualifier: Diagnosis of  By: Regis Bill MD, [name]  Stopped Dec 12 2010     Past Surgical History  Procedure Laterality Date  . Cholecystectomy    . Abdominal hysterectomy    . Tubal ligation    . Panendoscopy    . Bunionectomy      both feet  . Cystectomy      left shoulder  . Breast surgery      cyst from left breast  . Uterine ablastion      and polyps removed   Family History  Problem Relation Age of Onset  . Hypertension Mother   . Alzheimer's disease Mother   . Hypertension Father   . Prostate cancer Father   . HIV Brother   . Lupus Sister    History  Substance Use Topics  . Smoking status: Former Smoker -- 0.50 packs/day for 17 years    Types:  Cigarettes    Quit date: 12/12/2010  . Smokeless tobacco: Never Used  . Alcohol Use: No   OB History    No data available      Review of Systems  Musculoskeletal: Positive for arthralgias.  Neurological: Negative for numbness.  All other systems reviewed and are negative.   Allergies  Diovan hct; Purified water; Allegra; Chocolate; Cimetidine; Citric acid; Coly-mycin s; Peanut-containing drug products; and Amlodipine  Home Medications   Prior to Admission medications   Medication Sig Start Date End Date Taking? Authorizing Provider  albuterol (PROAIR HFA) 108 (90 BASE) MCG/ACT inhaler Inhale 2 puffs into the lungs every 4 (four) hours as needed for wheezing.     Historical Provider, MD  fluticasone (FLONASE) 50 MCG/ACT nasal spray Place 2 sprays into the nose daily as needed for allergies. 02/13/12 02/12/13  Burnis Medin, MD  ondansetron (ZOFRAN) 4 MG tablet Take 1 tablet (4 mg total) by mouth every 8 (eight) hours as needed for nausea or vomiting. 09/08/14   Lucretia Kern, DO   Triage vitals: BP 174/99 mmHg  Pulse 64  Temp(Src) 97.6 F (36.4 C) (Oral)  Resp 20  SpO2 98%  Physical Exam  Constitutional: She is oriented to person, place, and time. She appears well-developed and well-nourished. No distress.  Nontoxic/nonseptic appearing  HENT:  Head: Normocephalic and atraumatic.  Eyes: Conjunctivae and EOM are normal. No scleral icterus.  Neck: Normal range of motion.  Cardiovascular: Normal rate, regular rhythm and intact distal pulses.   Distal radial pulse 2+ in the right upper extremity. Capillary refill brisk in all digits of right hand.  Pulmonary/Chest: Effort normal. No respiratory distress.  Musculoskeletal: Normal range of motion.  Normal passive range of motion of the right wrist. There is tenderness to palpation with mild swelling and just proximal to the right ulnar styloid. No bony deformity or crepitus. No erythema, induration, or heat to touch.  Neurological:  She is alert and oriented to person, place, and time. She exhibits normal muscle tone. Coordination normal.  Sensation to light touch intact in all digits of right hand. Patient with good grip strength; 5/5.  Skin: Skin is warm and dry. No rash noted. She is not diaphoretic. No erythema. No pallor.  Psychiatric: She has a normal mood and affect. Her behavior is normal.  Nursing note and vitals reviewed.   ED Course  Procedures  DIAGNOSTIC STUDIES: Oxygen Saturation is 98% on RA, normal by my interpretation.  COORDINATION OF CARE:  12:02 AM Discussed treatment plan which includes wrist brace, use heat and ice, anti-inflammatory, follow up with hand specialist if pain consists with pt at bedside and pt agreed to plan.  Labs Review Labs Reviewed - No data to display  Imaging Review Dg Wrist Complete Right  05/09/2015   CLINICAL DATA:  49 year old female with right wrist pain.  EXAM: RIGHT WRIST - COMPLETE 3+ VIEW  COMPARISON:  None.  FINDINGS: There is no evidence of fracture or dislocation. There is no evidence of arthropathy or other focal bone abnormality. Soft tissues are unremarkable.  IMPRESSION: No fracture or dislocation.   Electronically Signed   By: Anner Crete M.D.   On: 05/09/2015 23:54     EKG Interpretation None      MDM   Final diagnoses:  Wrist sprain, right, initial encounter    49 year old female presents to the emergency department for further evaluation of right wrist pain times one week. Symptoms and physical exam consistent with a sprain to the right wrist. No concern for septic joint. X-ray negative for fracture or dislocation. R wrist placed in wrist brace in ED. Have advised NSAIDs and RICE for symptom management. Patient given referral to hand specialist should symptoms persist or worsen. Return precautions discussed and provided. Patient agreeable to plan with no unaddressed concerns. Patient discharged in good condition.  I personally performed the  services described in this documentation, which was scribed in my presence. The recorded information has been reviewed and is accurate.   Filed Vitals:   05/09/15 2312  BP: 174/99  Pulse: 64  Temp: 97.6 F (36.4 C)  TempSrc: Oral  Resp: 20  SpO2: 98%     Antonietta Breach, PA-C 05/10/15 [place], DO 05/10/15 3329

## 2015-05-10 MED ORDER — HYDROCODONE-ACETAMINOPHEN 5-325 MG PO TABS
2.0000 | ORAL_TABLET | Freq: Once | ORAL | Status: AC
  Administered 2015-05-10: 1 via ORAL
  Filled 2015-05-10: qty 2

## 2015-05-10 MED ORDER — MELOXICAM 7.5 MG PO TABS
15.0000 mg | ORAL_TABLET | Freq: Every day | ORAL | Status: DC
Start: 2015-05-10 — End: 2015-10-04

## 2015-05-10 MED ORDER — HYDROCODONE-ACETAMINOPHEN 5-325 MG PO TABS: 1.0000 | ORAL_TABLET | Freq: Four times a day (QID) | ORAL | Status: DC | PRN

## 2015-05-10 NOTE — Discharge Instructions (Signed)
Recommend ice to areas of injury 3-4 times per day for 15-20 minutes each time. Take Mobic as prescribed for inflammation and Norco as needed for pain. Wear a wrist brace for stability. Follow-up with a hand specialist if symptoms persist.  Wrist Pain Wrist injuries are frequent in adults and children. A sprain is an injury to the ligaments that hold your bones together. A strain is an injury to muscle or muscle cord-like structures (tendons) from stretching or pulling. Generally, when wrists are moderately tender to touch following a fall or injury, a break in the bone (fracture) may be present. Most wrist sprains or strains are better in 3 to 5 days, but complete healing may take several weeks. HOME CARE INSTRUCTIONS   Put ice on the injured area.  Put ice in a plastic bag.  Place a towel between your skin and the bag.  Leave the ice on for 15-20 minutes, 3-4 times a day, for the first 2 days, or as directed by your health care provider.  Keep your arm raised above the level of your heart whenever possible to reduce swelling and pain.  Rest the injured area for at least 48 hours or as directed by your health care provider.  If a splint or elastic bandage has been applied, use it for as long as directed by your health care provider or until seen by a health care provider for a follow-up exam.  Only take over-the-counter or prescription medicines for pain, discomfort, or fever as directed by your health care provider.  Keep all follow-up appointments. You may need to follow up with a specialist or have follow-up X-rays. Improvement in pain level is not a guarantee that you did not fracture a bone in your wrist. The only way to determine whether or not you have a broken bone is by X-ray. SEEK IMMEDIATE MEDICAL CARE IF:   Your fingers are swollen, very red, white, or cold and blue.  Your fingers are numb or tingling.  You have increasing pain.  You have difficulty moving your  fingers. MAKE SURE YOU:   Understand these instructions.  Will watch your condition.  Will get help right away if you are not doing well or get worse. Document Released: 07/31/2005 Document Revised: 10/26/2013 Document Reviewed: 12/12/2010 [facility] Patient Information 2015 [place], [place]. This information is not intended to replace advice given to you by your health care provider. Make sure you discuss any questions you have with your health care provider.

## 2015-10-04 ENCOUNTER — Encounter: Payer: Self-pay | Admitting: Internal Medicine

## 2015-10-04 ENCOUNTER — Ambulatory Visit (INDEPENDENT_AMBULATORY_CARE_PROVIDER_SITE_OTHER): Payer: 59 | Admitting: Internal Medicine

## 2015-10-04 ENCOUNTER — Encounter: Payer: Self-pay | Admitting: Family Medicine

## 2015-10-04 VITALS — BP 148/100 | HR 68 | Temp 98.3°F | Wt 130.1 lb

## 2015-10-04 DIAGNOSIS — J45901 Unspecified asthma with (acute) exacerbation: Secondary | ICD-10-CM | POA: Diagnosis not present

## 2015-10-04 DIAGNOSIS — S61219A Laceration without foreign body of unspecified finger without damage to nail, initial encounter: Secondary | ICD-10-CM | POA: Diagnosis not present

## 2015-10-04 DIAGNOSIS — R0789 Other chest pain: Secondary | ICD-10-CM

## 2015-10-04 DIAGNOSIS — J989 Respiratory disorder, unspecified: Secondary | ICD-10-CM | POA: Diagnosis not present

## 2015-10-04 MED ORDER — PREDNISONE 20 MG PO TABS: ORAL_TABLET | ORAL | Status: DC

## 2015-10-04 MED ORDER — ALBUTEROL SULFATE HFA 108 (90 BASE) MCG/ACT IN AERS: 2.0000 | INHALATION_SPRAY | Freq: Four times a day (QID) | RESPIRATORY_TRACT | Status: DC | PRN

## 2015-10-04 NOTE — Progress Notes (Signed)
Pre visit review using our clinic review tool, if applicable. No additional management support is needed unless otherwise documented below in the visit note.  Chief Complaint  Patient presents with  . Sore Throat  . Fatigue  . Chest Tightness  . Shortness of Breath    HPI: Patient [name]  comes in today for SDA for  new problem (s)evaluation.   Onset  With bad cough and sob and then  Chest pressure  Was stripping floor in showroom.  Fumes ? Went outside  For fresh air  But continues.  So used inhaler yesterday minimal but some relief  ? Expired.  No chocking episode  Now still heavy chest  Breathing better . But  Some hoarse  No cough today    No fever. Hoarse.  ROS: See pertinent positives and negatives per HPI.no fever some st no runny nose or edema swelling  No vomiting  Cut index finger sharp clean kitchen knife 3 days ago  oprn on fingertip no bleeding  What  to do? utd on td  Hx of porb asthma an upper airway syndrome and reflux seem dr Melvyn Novas 2012 had been doing well Past Medical History  Diagnosis Date  . Allergy   . Asthma   . GERD (gastroesophageal reflux disease)   . Hypertension   . Positive TB test   . 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   . TOBACCO USE 03/22/2009    Qualifier: Diagnosis of  By: Regis Bill MD, [name]  Stopped Dec 12 2010      Family History  Problem Relation Age of Onset  . Hypertension Mother   . Alzheimer's disease Mother   . Hypertension Father   . Prostate cancer Father   . HIV Brother   . Lupus Sister     Social History   Social History  . Marital Status: Divorced    Spouse Name: N/A  . Number of Children: N/A  . Years of Education: N/A   Social History Main Topics  . Smoking status: Former Smoker -- 0.50 packs/day for 17 years    Types: Cigarettes    Quit date: 12/12/2010  . Smokeless tobacco: Never Used  . Alcohol Use: No  . Drug Use: No  . Sexual Activity: Not Asked   Other Topics Concern  .  None   Social History Narrative   Divorced    Tobacco since age 67 stopped feb 2012   Works  [employer]ner, broadcasting/film/video.Patient account REP    Outpatient Prescriptions Prior to Visit  Medication Sig Dispense Refill  . albuterol (PROAIR HFA) 108 (90 BASE) MCG/ACT inhaler Inhale 2 puffs into the lungs every 4 (four) hours as needed for wheezing.     . fluticasone (FLONASE) 50 MCG/ACT nasal spray Place 2 sprays into the nose daily as needed for allergies.    Marland Kitchen HYDROcodone-acetaminophen (NORCO/VICODIN) 5-325 MG per tablet Take 1 tablet by mouth every 6 (six) hours as needed. 11 tablet 0  . meloxicam (MOBIC) 7.5 MG tablet Take 2 tablets (15 mg total) by mouth daily. 30 tablet 0  . ondansetron (ZOFRAN) 4 MG tablet Take 1 tablet (4 mg total) by mouth every 8 (eight) hours as needed for nausea or vomiting. 20 tablet 0   No facility-administered medications prior to visit.     EXAM:  BP 148/100 mmHg  Pulse 68  Temp(Src) 98.3 F (36.8 C) (Oral)  Wt 130 lb 1.6 oz (59.013 kg)  SpO2 98%  Body mass index is 25.41 kg/(m^2).  GENERAL: vitals reviewed and listed above, alert, oriented, appears well hydrated and in no acute distresshoarse  No strgdor  Looks tireda and not feeling well but non toxic  HEENT: atraumatic, conjunctiva  clear, no obvious abnormalities on inspection of external nose and earstms nl OP : no lesion edema or exudate mild erythema   NECK: no obvious masses on inspection palpation  LUNGS: clear to auscultation bilaterally, no wheezes, rales or rhonchi,  bs =? CV: HRRR, no clubbing cyanosis or  peripheral edema nl cap refill  MS: moves all extremities without noticeable focal  Abnormality Index finger right  Open laceration 1 mm deep  Central lesion coalesced and healing  No infection  PSYCH: pleasant and cooperative, no obvious depression or anxiety  ASSESSMENT AND PLAN:  Discussed the following assessment and plan:  Chest heaviness  Asthma with exacerbation,  unspecified asthma severity - ? reactive to inhalants by hx vs oncoming viral restp infection  albuterol if needed add pred if needed fu alarm sx   Respiratory illness  Laceration of finger, initial encounter - healing by secondary intention advice given check prn protection and antibiotic ointmnet Note for work  -Patient advised to return or notify health care team  if symptoms worsen ,persist or new concerns arise.  Patient Instructions  Sounds like  A reaction to inahlation and should improve with albuterol and time but if ongogin then can add prednisone for 5 days .  Also possibly early chest cold that may run its course.  Contactus if no help in a week or  Fever chills etc.   Keep laceration covered and antibiotic ointment  Protect the wound and should heal after a week or 2 .      [name]. [name] M.D.

## 2015-10-04 NOTE — Patient Instructions (Signed)
Sounds like  A reaction to inahlation and should improve with albuterol and time but if ongogin then can add prednisone for 5 days .  Also possibly early chest cold that may run its course.  Contactus if no help in a week or  Fever chills etc.   Keep laceration covered and antibiotic ointment  Protect the wound and should heal after a week or 2 .

## 2015-10-16 ENCOUNTER — Encounter (HOSPITAL_COMMUNITY): Payer: Self-pay | Admitting: Family Medicine

## 2015-10-16 ENCOUNTER — Emergency Department (HOSPITAL_COMMUNITY): Payer: 59

## 2015-10-16 ENCOUNTER — Emergency Department (HOSPITAL_COMMUNITY)
Admission: EM | Admit: 2015-10-16 | Discharge: 2015-10-16 | Disposition: A | Discharge status: Home / Self Care | Payer: 59 | Attending: Emergency Medicine | Admitting: Emergency Medicine

## 2015-10-16 DIAGNOSIS — J45909 Unspecified asthma, uncomplicated: Secondary | ICD-10-CM | POA: Insufficient documentation

## 2015-10-16 DIAGNOSIS — Z79899 Other long term (current) drug therapy: Secondary | ICD-10-CM | POA: Insufficient documentation

## 2015-10-16 DIAGNOSIS — Z8719 Personal history of other diseases of the digestive system: Secondary | ICD-10-CM | POA: Diagnosis not present

## 2015-10-16 DIAGNOSIS — X58XXXA Exposure to other specified factors, initial encounter: Secondary | ICD-10-CM | POA: Insufficient documentation

## 2015-10-16 DIAGNOSIS — S83207A Unspecified tear of unspecified meniscus, current injury, left knee, initial encounter: Secondary | ICD-10-CM | POA: Insufficient documentation

## 2015-10-16 DIAGNOSIS — Y998 Other external cause status: Secondary | ICD-10-CM | POA: Insufficient documentation

## 2015-10-16 DIAGNOSIS — S8992XA Unspecified injury of left lower leg, initial encounter: Secondary | ICD-10-CM | POA: Diagnosis present

## 2015-10-16 DIAGNOSIS — I10 Essential (primary) hypertension: Secondary | ICD-10-CM | POA: Diagnosis not present

## 2015-10-16 DIAGNOSIS — Y9289 Other specified places as the place of occurrence of the external cause: Secondary | ICD-10-CM | POA: Insufficient documentation

## 2015-10-16 DIAGNOSIS — Y9301 Activity, walking, marching and hiking: Secondary | ICD-10-CM | POA: Diagnosis not present

## 2015-10-16 DIAGNOSIS — Z87891 Personal history of nicotine dependence: Secondary | ICD-10-CM | POA: Insufficient documentation

## 2015-10-16 DIAGNOSIS — M2392 Unspecified internal derangement of left knee: Secondary | ICD-10-CM

## 2015-10-16 MED ORDER — OXYCODONE-ACETAMINOPHEN 5-325 MG PO TABS: 1.0000 | ORAL_TABLET | Freq: Four times a day (QID) | ORAL | Status: DC | PRN

## 2015-10-16 MED ORDER — OXYCODONE-ACETAMINOPHEN 5-325 MG PO TABS
1.0000 | ORAL_TABLET | ORAL | Status: AC
  Administered 2015-10-16: 1 via ORAL
  Filled 2015-10-16: qty 1

## 2015-10-16 NOTE — ED Notes (Signed)
Pt reports she was walking on a solid surface and she heard a "pop" in her left knee. Pt reports she has had previous knee surgery.

## 2015-10-16 NOTE — ED Provider Notes (Signed)
CSN: [id number]     Arrival date & time 10/16/15  0003 History   First MD Initiated Contact with Patient 10/16/15 0013     Chief Complaint  Patient presents with  . Knee Pain     (Consider location/radiation/quality/duration/timing/severity/associated sxs/prior Treatment) HPI Comments: Walking when L knee POPPED has been unable to bear weight denies numbness tingling Reports swelling   Patient is a 49 y.o. female presenting with knee pain. The history is provided by the patient.  Knee Pain Location:  Knee Injury: yes   Knee location:  L knee Pain details:    Quality:  Shooting   Severity:  Severe   Onset quality:  Sudden   Timing:  Constant   Progression:  Unchanged Chronicity:  New Dislocation: no   Prior injury to area:  Yes Relieved by:  Nothing Worsened by:  Nothing tried Ineffective treatments:  None tried Associated symptoms: no fever     Past Medical History  Diagnosis Date  . Allergy   . Asthma   . GERD (gastroesophageal reflux disease)   . Hypertension   . Positive TB test   . 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   . TOBACCO USE 03/22/2009    Qualifier: Diagnosis of  By: Regis Bill MD, [name]  Stopped Dec 12 2010     Past Surgical History  Procedure Laterality Date  . Cholecystectomy    . Abdominal hysterectomy    . Tubal ligation    . Panendoscopy    . Bunionectomy      both feet  . Cystectomy      left shoulder  . Breast surgery      cyst from left breast  . Uterine ablastion      and polyps removed  . Knee surgery Left    Family History  Problem Relation Age of Onset  . Hypertension Mother   . Alzheimer's disease Mother   . Hypertension Father   . Prostate cancer Father   . HIV Brother   . Lupus Sister    Social History  Substance Use Topics  . Smoking status: Former Smoker -- 0.50 packs/day for 17 years    Types: Cigarettes    Quit date: 12/12/2010  . Smokeless tobacco: Never Used  . Alcohol Use: No   OB  History    No data available     Review of Systems  Constitutional: Negative for fever.  Musculoskeletal: Positive for joint swelling.  Skin: Negative for wound.  Neurological: Negative for numbness.  All other systems reviewed and are negative.     Allergies  Diovan hct; Purified water; Allegra; Chocolate; Cimetidine; Citric acid; Coly-mycin s; Peanut-containing drug products; and Amlodipine  Home Medications   Prior to Admission medications   Medication Sig Start Date End Date Taking? Authorizing Provider  albuterol (PROAIR HFA) 108 (90 BASE) MCG/ACT inhaler Inhale 2 puffs into the lungs every 6 (six) hours as needed for wheezing or shortness of breath. 10/04/15  Yes Burnis Medin, MD  ibuprofen (ADVIL,MOTRIN) 200 MG tablet Take 200 mg by mouth every 6 (six) hours as needed (FOR PAIN.).   Yes Historical Provider, MD  oxyCODONE-acetaminophen (PERCOCET/ROXICET) 5-325 MG tablet Take 1-2 tablets by mouth every 6 (six) hours as needed for severe pain. 10/16/15   Junius Creamer, NP  predniSONE (DELTASONE) 20 MG tablet Take 3 po qd for 2 days then 2 po qd for 3 days,or as directed Patient not taking: Reported on 10/16/2015 10/04/15  Burnis Medin, MD   BP 140/86 mmHg  Pulse 76  Temp(Src) 97.8 F (36.6 C) (Oral)  Resp 18  Ht 5' (1.524 m)  Wt 58.968 kg  BMI 25.39 kg/m2  SpO2 98% Physical Exam  Constitutional: She appears well-developed and well-nourished.  HENT:  Head: Normocephalic.  Eyes: Pupils are equal, round, and reactive to light.  Neck: Normal range of motion.  Cardiovascular: Normal rate.   Pulmonary/Chest: Effort normal.  Musculoskeletal: She exhibits tenderness. She exhibits no edema.       Left knee: She exhibits decreased range of motion and swelling. She exhibits no ecchymosis, no deformity, no laceration, no erythema and normal alignment. Tenderness found. Lateral joint line tenderness noted.  Neurological: She is alert.  Skin: Skin is warm. No erythema.   Vitals reviewed.   ED Course  Procedures (including critical care time) Labs Review Labs Reviewed - No data to display  Imaging Review Dg Knee Complete 4 Views Left  10/16/2015  CLINICAL DATA:  Acute onset of left lateral and popliteal knee pain. Heard pop while walking. Initial encounter. EXAM: LEFT KNEE - COMPLETE 4+ VIEW COMPARISON:  MRI of the left lower thigh performed 03/28/2005 FINDINGS: There is no evidence of fracture or dislocation. The joint spaces are preserved. No significant degenerative change is seen; the patellofemoral joint is grossly unremarkable in appearance. No significant joint effusion is seen. The visualized soft tissues are normal in appearance. IMPRESSION: No evidence of fracture or dislocation. Electronically Signed   By: Garald Balding M.D.   On: 10/16/2015 01:23   I have personally reviewed and evaluated these images and lab results as part of my medical decision-making.   EKG Interpretation None      MDM   Final diagnoses:  Internal derangement of knee joint, left         Junius Creamer, NP 10/16/15 0516  April Palumbo, MD 10/16/15 [phone]

## 2016-05-14 ENCOUNTER — Ambulatory Visit (INDEPENDENT_AMBULATORY_CARE_PROVIDER_SITE_OTHER): Payer: 59 | Admitting: Internal Medicine

## 2016-05-14 ENCOUNTER — Encounter: Payer: Self-pay | Admitting: Internal Medicine

## 2016-05-14 VITALS — BP 160/100 | Temp 98.8°F | Wt 133.5 lb

## 2016-05-14 DIAGNOSIS — J029 Acute pharyngitis, unspecified: Secondary | ICD-10-CM

## 2016-05-14 DIAGNOSIS — R030 Elevated blood-pressure reading, without diagnosis of hypertension: Secondary | ICD-10-CM

## 2016-05-14 DIAGNOSIS — IMO0001 Reserved for inherently not codable concepts without codable children: Secondary | ICD-10-CM

## 2016-05-14 LAB — POCT RAPID STREP A (OFFICE): Rapid Strep A Screen: NEGATIVE

## 2016-05-14 MED ORDER — AMOXICILLIN 500 MG PO CAPS: 500.0000 mg | ORAL_CAPSULE | Freq: Two times a day (BID) | ORAL | Status: DC

## 2016-05-14 NOTE — Patient Instructions (Signed)
Checking for strep  May be viral  But  If sever sx and glands   Can begin antibiotic waiting for the culutre . If coughing congestion ilness then probably viral and  Will run its course.   Your BP is very high even for illness  160/100   Get a monbitor   Take blood pressure readings twice a day for 7- 10 days To ensure below 140/90   .Send in readings      Or OV to check bp monitor and readings  In a month

## 2016-05-14 NOTE — Progress Notes (Signed)
Pre visit review using our clinic review tool, if applicable. No additional management support is needed unless otherwise documented below in the visit note. 

## 2016-05-14 NOTE — Progress Notes (Signed)
Chief Complaint  Patient presents with  . Sore Throat  . Headache    HPI: Merlina R Condie 50 y.o.  sda appt for   1 days of thraot paoin   Sever "my tonsils are hurting infection"  No fever  Glands hurt  No cough congestion  No fever no exposures   And feels like tonsil infecition   hasn't checked bp recently used to be on med but  Had off and  No cp sob  controlled by lsi . No tobacco since 2012 ROS: See pertinent positives and negatives per HPI.  Past Medical History  Diagnosis Date  . Allergy   . Asthma   . GERD (gastroesophageal reflux disease)   . Hypertension   . Positive TB test   . 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   . TOBACCO USE 03/22/2009    Qualifier: Diagnosis of  By: Regis Bill MD, [name]  Stopped Dec 12 2010      Family History  Problem Relation Age of Onset  . Hypertension Mother   . Alzheimer's disease Mother   . Hypertension Father   . Prostate cancer Father   . HIV Brother   . Lupus Sister     Social History   Social History  . Marital Status: Divorced    Spouse Name: N/A  . Number of Children: N/A  . Years of Education: N/A   Social History Main Topics  . Smoking status: Former Smoker -- 0.50 packs/day for 17 years    Types: Cigarettes    Quit date: 12/12/2010  . Smokeless tobacco: Never Used  . Alcohol Use: No  . Drug Use: No  . Sexual Activity: Not Asked   Other Topics Concern  . None   Social History Narrative   Divorced    Tobacco since age 74 stopped feb 2012   Works  [employer], broadcasting/film/video.Patient account REP    Outpatient Prescriptions Prior to Visit  Medication Sig Dispense Refill  . albuterol (PROAIR HFA) 108 (90 BASE) MCG/ACT inhaler Inhale 2 puffs into the lungs every 6 (six) hours as needed for wheezing or shortness of breath. 1 Inhaler 2  . ibuprofen (ADVIL,MOTRIN) 200 MG tablet Take 200 mg by mouth every 6 (six) hours as needed (FOR PAIN.).    Marland Kitchen oxyCODONE-acetaminophen  (PERCOCET/ROXICET) 5-325 MG tablet Take 1-2 tablets by mouth every 6 (six) hours as needed for severe pain. 20 tablet 0  . predniSONE (DELTASONE) 20 MG tablet Take 3 po qd for 2 days then 2 po qd for 3 days,or as directed (Patient not taking: Reported on 10/16/2015) 12 tablet 0   No facility-administered medications prior to visit.     EXAM:  BP 160/100 mmHg  Temp(Src) 98.8 F (37.1 C) (Oral)  Wt 133 lb 8 oz (60.555 kg)  Body mass index is 26.07 kg/(m^2).  GENERAL: vitals reviewed and listed above, alert, oriented, appears well hydrated and in no acute distress non toxic mild distress  Uncomfortable nl speech  HEENT: atraumatic, conjunctiva  clear, no obvious abnormalities on inspection of external nose and ears tms clear OP : no lesion 2+ red and  No exudate  NECK: no obvious masses on inspection  Very tender jdg nodes on palpation  No pc nodes LUNGS: clear to auscultation bilaterally, no wheezes, rales or rhonchi, good air movement CV: HRRR, no clubbing cyanosis or  peripheral edema nl cap refill  MS: moves all extremities without noticeable focal  abnormality  nl cp refill   ASSESSMENT AND PLAN:  Discussed the following assessment and plan:  Sore throat - pharyngitis r/o strep expectant managemtne printed rx to add in interim  if indicated  - Plan: POC Rapid Strep A, Culture, Group A Strep  Elevated BP - prev control w  lsi  upt today more than expected by illness may need to go back on meds   Acute pharyngitis, unspecified etiology Cannot take ace arb  Uncertain diuretics  May consider ccp for bp controll if needed  -Patient advised to return or notify health care team  if symptoms worsen ,persist or new concerns arise.  Patient Instructions  Checking for strep  May be viral  But  If sever sx and glands   Can begin antibiotic waiting for the culutre . If coughing congestion ilness then probably viral and  Will run its course.   Your BP is very high even for illness  160/100    Get a monbitor   Take blood pressure readings twice a day for 7- 10 days To ensure below 140/90   .Send in readings      Or OV to check bp monitor and readings  In a month     Wanda K. [name] M.D.

## 2016-05-16 LAB — CULTURE, GROUP A STREP: Organism ID, Bacteria: NORMAL

## 2016-06-14 ENCOUNTER — Ambulatory Visit: Payer: 59 | Admitting: Internal Medicine

## 2016-07-24 ENCOUNTER — Ambulatory Visit (INDEPENDENT_AMBULATORY_CARE_PROVIDER_SITE_OTHER): Payer: Self-pay | Admitting: Internal Medicine

## 2016-07-24 ENCOUNTER — Encounter: Payer: Self-pay | Admitting: Internal Medicine

## 2016-07-24 ENCOUNTER — Ambulatory Visit (INDEPENDENT_AMBULATORY_CARE_PROVIDER_SITE_OTHER)
Admission: RE | Admit: 2016-07-24 | Discharge: 2016-07-24 | Disposition: A | Discharge status: Home / Self Care | Payer: Self-pay | Source: Ambulatory Visit | Attending: Internal Medicine | Admitting: Internal Medicine

## 2016-07-24 VITALS — BP 134/100 | HR 83 | Temp 99.1°F | Wt 132.6 lb

## 2016-07-24 DIAGNOSIS — J45901 Unspecified asthma with (acute) exacerbation: Secondary | ICD-10-CM

## 2016-07-24 DIAGNOSIS — J209 Acute bronchitis, unspecified: Secondary | ICD-10-CM

## 2016-07-24 MED ORDER — BUDESONIDE-FORMOTEROL FUMARATE 80-4.5 MCG/ACT IN AERO: 2.0000 | INHALATION_SPRAY | Freq: Two times a day (BID) | RESPIRATORY_TRACT | 3 refills | Status: DC

## 2016-07-24 MED ORDER — ALBUTEROL SULFATE HFA 108 (90 BASE) MCG/ACT IN AERS: 2.0000 | INHALATION_SPRAY | Freq: Four times a day (QID) | RESPIRATORY_TRACT | 2 refills | Status: DC | PRN

## 2016-07-24 MED ORDER — IPRATROPIUM-ALBUTEROL 0.5-2.5 (3) MG/3ML IN SOLN
3.0000 mL | Freq: Once | RESPIRATORY_TRACT | Status: AC
  Administered 2016-07-24: 3 mL via RESPIRATORY_TRACT

## 2016-07-24 MED FILL — SYMBICORT 80-4.5 MCG INH: 80-4.5 | 30 days supply | Qty: 10 | Fill #0

## 2016-07-24 NOTE — Progress Notes (Signed)
Chief Complaint  Patient presents with  . Cough    Started Sunday.  Cough is productive of a "brown and chunky" mucous.  . Fatigue  . Shortness of Breath    HPI: [name] 50 y.o.  sda 3 days of sx  As above   Onset actually last week acute onset coughing like allergic reaction to a perfume then improved on bendadryl but not gone  And  3 days ago worse with malaise  Cough   Now thick  Choking brown phlegm this am  Coughs a mouth full each cough    Out of albuterol Here with HER-2 children today. ROS: See pertinent positives and negatives per HPI. Works [employer] needs note for work  Still tobacco free.... Past Medical History:  Diagnosis Date  . 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   . Allergy   . Asthma   . GERD (gastroesophageal reflux disease)   . Hypertension   . Positive TB test   . TOBACCO USE 03/22/2009   Qualifier: Diagnosis of  By: Regis Bill MD, [name]  Stopped Dec 12 2010      Family History  Problem Relation Age of Onset  . Hypertension Mother   . Alzheimer's disease Mother   . Hypertension Father   . Prostate cancer Father   . HIV Brother   . Lupus Sister     Social History   Social History  . Marital status: Divorced    Spouse name: N/A  . Number of children: N/A  . Years of education: N/A   Social History Main Topics  . Smoking status: Former Smoker    Packs/day: 0.50    Years: 17.00    Types: Cigarettes    Quit date: 12/12/2010  . Smokeless tobacco: Never Used  . Alcohol use No  . Drug use: No  . Sexual activity: Not Asked   Other Topics Concern  . None   Social History Narrative   Divorced    Tobacco since age 80 stopped feb 2012   Works  [employer], broadcasting/film/video.Patient account REP    Outpatient Medications Prior to Visit  Medication Sig Dispense Refill  . albuterol (PROAIR HFA) 108 (90 BASE) MCG/ACT inhaler Inhale 2 puffs into the lungs every 6 (six) hours as needed for wheezing or shortness of breath.  (Patient not taking: Reported on 07/24/2016) 1 Inhaler 2  . amoxicillin (AMOXIL) 500 MG capsule Take 1 capsule (500 mg total) by mouth 2 (two) times daily. 20 capsule 0   No facility-administered medications prior to visit.      EXAM:  BP (!) 134/100 (BP Location: Right Arm, Patient Position: Sitting, Cuff Size: Normal)   Pulse 83   Temp 99.1 F (37.3 C) (Oral)   Wt 132 lb 9.6 oz (60.1 kg)   SpO2 97%   BMI 25.90 kg/m   Body mass index is 25.9 kg/m.  GENERAL: vitals reviewed and listed above, alert, oriented, appears well hydrated and in no acute distress  ocass cough  WDWN in NAD  quiet respirations; mildly congested  somewhat hoarse. Non toxic .looks sick  HEENT: Normocephalic ;atraumatic , Eyes;  PERRL, EOMs  Full, lids and conjunctiva clear,,Ears: no deformities, canals nl, TM landmarks normal, Nose: no deformity or discharge but congested;face minimally tender Mouth : OP clear without lesion or edema . Neck: Supple without adenopathy or masses or bruits Chest:  Clear to A&P without wheezes rales or rhonchiDecreased breath sounds improved after  DuoNeb nebulizer. CV:  S1-S2 no gallops or murmurs peripheral perfusion is normal Skin :nl perfusion and no acute rashes   ASSESSMENT AND PLAN:  Discussed the following assessment and plan:  Acute bronchitis, unspecified organism - Plan: DG Chest 2 View  Asthma with exacerbation, unspecified asthma severity ? at risk  - Plan: DG Chest 2 View Concern rule out pneumonia other based on clinical severity and presentation. She will contact us tomorrow the next day if worsening consider antibiotic intervention at that point. If appropriate. -Patient advised to return or notify health care team  if symptoms worsen ,persist or new concerns arise.  Patient Instructions  This is probably acute bronchitis with some asthmatic component. You can use albuterol as needed fluids etc. Get chest x-ray to check for pneumonia. If the dark brown color  Of  phlegm continues or there is pneumonia we will add an antibiotic.    Acute Bronchitis Bronchitis is inflammation of the airways that extend from the windpipe into the lungs (bronchi). The inflammation often causes mucus to develop. This leads to a cough, which is the most common symptom of bronchitis.  In acute bronchitis, the condition usually develops suddenly and goes away over time, usually in a couple weeks. Smoking, allergies, and asthma can make bronchitis worse. Repeated episodes of bronchitis may cause further lung problems.  CAUSES Acute bronchitis is most often caused by the same virus that causes a cold. The virus can spread from person to person (contagious) through coughing, sneezing, and touching contaminated objects. SIGNS AND SYMPTOMS   Cough.   Fever.   Coughing up mucus.   Body aches.   Chest congestion.   Chills.   Shortness of breath.   Sore throat.  DIAGNOSIS  Acute bronchitis is usually diagnosed through a physical exam. Your health care provider will also ask you questions about your medical history. Tests, such as chest X-rays, are sometimes done to rule out other conditions.  TREATMENT  Acute bronchitis usually goes away in a couple weeks. Oftentimes, no medical treatment is necessary. Medicines are sometimes given for relief of fever or cough. Antibiotic medicines are usually not needed but may be prescribed in certain situations. In some cases, an inhaler may be recommended to help reduce shortness of breath and control the cough. A cool mist vaporizer may also be used to help thin bronchial secretions and make it easier to clear the chest.  HOME CARE INSTRUCTIONS  Get plenty of rest.   Drink enough fluids to keep your urine clear or pale yellow (unless you have a medical condition that requires fluid restriction). Increasing fluids may help thin your respiratory secretions (sputum) and reduce chest congestion, and it will prevent dehydration.    Take medicines only as directed by your health care provider.  If you were prescribed an antibiotic medicine, finish it all even if you start to feel better.  Avoid smoking and secondhand smoke. Exposure to cigarette smoke or irritating chemicals will make bronchitis worse. If you are a smoker, consider using nicotine gum or skin patches to help control withdrawal symptoms. Quitting smoking will help your lungs heal faster.   Reduce the chances of another bout of acute bronchitis by washing your hands frequently, avoiding people with cold symptoms, and trying not to touch your hands to your mouth, nose, or eyes.   Keep all follow-up visits as directed by your health care provider.  SEEK MEDICAL CARE IF: Your symptoms do not improve after 1 week of treatment.  SEEK IMMEDIATE MEDICAL CARE IF:  You develop an increased fever or chills.   You have chest pain.   You have severe shortness of breath.  You have bloody sputum.   You develop dehydration.  You faint or repeatedly feel like you are going to pass out.  You develop repeated vomiting.  You develop a severe headache. MAKE SURE YOU:   Understand these instructions.  Will watch your condition.  Will get help right away if you are not doing well or get worse.   This information is not intended to replace advice given to you by your health care provider. Make sure you discuss any questions you have with your health care provider.   Document Released: 11/28/2004 Document Revised: 11/11/2014 Document Reviewed: 04/13/2013 Elsevier Interactive Patient Education 2016 [place] K. Esty Ahuja M.D.

## 2016-07-24 NOTE — Progress Notes (Signed)
Pre visit review using our clinic review tool, if applicable. No additional management support is needed unless otherwise documented below in the visit note. 

## 2016-07-24 NOTE — Addendum Note (Signed)
Addended by: Miles Costain T on: 07/24/2016 02:10 PM   Modules accepted: Orders

## 2016-07-24 NOTE — Patient Instructions (Addendum)
This is probably acute bronchitis with some asthmatic component. You can use albuterol as needed fluids etc. Get chest x-ray to check for pneumonia. If the dark brown color  Of phlegm continues or there is pneumonia we will add an antibiotic.    Acute Bronchitis Bronchitis is inflammation of the airways that extend from the windpipe into the lungs (bronchi). The inflammation often causes mucus to develop. This leads to a cough, which is the most common symptom of bronchitis.  In acute bronchitis, the condition usually develops suddenly and goes away over time, usually in a couple weeks. Smoking, allergies, and asthma can make bronchitis worse. Repeated episodes of bronchitis may cause further lung problems.  CAUSES Acute bronchitis is most often caused by the same virus that causes a cold. The virus can spread from person to person (contagious) through coughing, sneezing, and touching contaminated objects. SIGNS AND SYMPTOMS   Cough.   Fever.   Coughing up mucus.   Body aches.   Chest congestion.   Chills.   Shortness of breath.   Sore throat.  DIAGNOSIS  Acute bronchitis is usually diagnosed through a physical exam. Your health care provider will also ask you questions about your medical history. Tests, such as chest X-rays, are sometimes done to rule out other conditions.  TREATMENT  Acute bronchitis usually goes away in a couple weeks. Oftentimes, no medical treatment is necessary. Medicines are sometimes given for relief of fever or cough. Antibiotic medicines are usually not needed but may be prescribed in certain situations. In some cases, an inhaler may be recommended to help reduce shortness of breath and control the cough. A cool mist vaporizer may also be used to help thin bronchial secretions and make it easier to clear the chest.  HOME CARE INSTRUCTIONS  Get plenty of rest.   Drink enough fluids to keep your urine clear or pale yellow (unless you have a medical  condition that requires fluid restriction). Increasing fluids may help thin your respiratory secretions (sputum) and reduce chest congestion, and it will prevent dehydration.   Take medicines only as directed by your health care provider.  If you were prescribed an antibiotic medicine, finish it all even if you start to feel better.  Avoid smoking and secondhand smoke. Exposure to cigarette smoke or irritating chemicals will make bronchitis worse. If you are a smoker, consider using nicotine gum or skin patches to help control withdrawal symptoms. Quitting smoking will help your lungs heal faster.   Reduce the chances of another bout of acute bronchitis by washing your hands frequently, avoiding people with cold symptoms, and trying not to touch your hands to your mouth, nose, or eyes.   Keep all follow-up visits as directed by your health care provider.  SEEK MEDICAL CARE IF: Your symptoms do not improve after 1 week of treatment.  SEEK IMMEDIATE MEDICAL CARE IF:  You develop an increased fever or chills.   You have chest pain.   You have severe shortness of breath.  You have bloody sputum.   You develop dehydration.  You faint or repeatedly feel like you are going to pass out.  You develop repeated vomiting.  You develop a severe headache. MAKE SURE YOU:   Understand these instructions.  Will watch your condition.  Will get help right away if you are not doing well or get worse.   This information is not intended to replace advice given to you by your health care provider. Make sure you discuss any  questions you have with your health care provider.   Document Released: 11/28/2004 Document Revised: 11/11/2014 Document Reviewed: 04/13/2013 Elsevier Interactive Patient Education [employer].

## 2016-07-24 NOTE — Progress Notes (Signed)
Left a message for a return call.

## 2016-07-26 ENCOUNTER — Telehealth: Payer: Self-pay | Admitting: Internal Medicine

## 2016-07-26 MED ORDER — DOXYCYCLINE HYCLATE 100 MG PO TABS: 100.0000 mg | ORAL_TABLET | Freq: Two times a day (BID) | ORAL | 0 refills | Status: DC

## 2016-07-26 MED FILL — DOXYCYCLINE HYCLATE 100 MG: 100 | 7 days supply | Qty: 14 | Fill #0

## 2016-07-26 NOTE — Telephone Encounter (Signed)
Pt seen 9/20 and instructed to call back if not better.   Pt states she is not any better, has severe coughing, keeping her up at night. Pt reports feeling like"rocks in her chest" No appts today with Dr Regis Bill, pt prefers to see her provider. Please advise.

## 2016-07-26 NOTE — Telephone Encounter (Signed)
Spoke to the pt.  She denied fever.  Cough is worse and still productive (brown/green).  Still SOB and fatigue.

## 2016-07-26 NOTE — Telephone Encounter (Signed)
Please send in doxycycline 100 mg 1 by mouth twice a day 7 days. She can let us know how she is doing next week.

## 2016-07-26 NOTE — Telephone Encounter (Signed)
Pt notified to pick up at the pharmacy and to complete all medication.  Call back in 1 wk or worsening.

## 2016-08-21 ENCOUNTER — Telehealth: Payer: Self-pay | Admitting: Internal Medicine

## 2016-08-21 ENCOUNTER — Ambulatory Visit (INDEPENDENT_AMBULATORY_CARE_PROVIDER_SITE_OTHER): Payer: Self-pay | Admitting: Family Medicine

## 2016-08-21 VITALS — BP 150/98 | HR 100 | Temp 98.6°F | Ht 60.0 in | Wt 132.4 lb

## 2016-08-21 DIAGNOSIS — R05 Cough: Secondary | ICD-10-CM

## 2016-08-21 DIAGNOSIS — R059 Cough, unspecified: Secondary | ICD-10-CM

## 2016-08-21 MED ORDER — PREDNISONE 20 MG PO TABS: ORAL_TABLET | ORAL | 0 refills | Status: DC

## 2016-08-21 MED FILL — predniSONE 20 MG TABS: 20 | 5 days supply | Qty: 10 | Fill #0

## 2016-08-21 NOTE — Progress Notes (Signed)
Subjective:     Patient ID: [name], female   DOB: Jan 25, 1966, 50 y.o.   MRN: [id number]  HPI Patient seen with cough. She is a former smoker and reported history of asthma. 3 weeks ago she was diagnosed with "acute bronchitis ". She had chest x-ray then which showed no acute findings. Was treated with doxycycline. She was feeling better until this past Friday was around someone and had very strong perfume that triggered her cough. By Saturday she was feeling better. She was then around someone yesterday with strong perfume and again that seemed to trigger her cough. She has Symbicort but does not take regularly. Recent prescription for albuterol but never got this filled. She is having some wheezing intermittently. No productive cough. No hemoptysis. No dyspnea at rest.  Past Medical History:  Diagnosis Date  . 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   . Allergy   . Asthma   . GERD (gastroesophageal reflux disease)   . Hypertension   . Positive TB test   . TOBACCO USE 03/22/2009   Qualifier: Diagnosis of  By: Regis Bill MD, [name]  Stopped Dec 12 2010     Past Surgical History:  Procedure Laterality Date  . ABDOMINAL HYSTERECTOMY    . BREAST SURGERY     cyst from left breast  . BUNIONECTOMY     both feet  . CHOLECYSTECTOMY    . CYSTECTOMY     left shoulder  . KNEE SURGERY Left   . PANENDOSCOPY    . TUBAL LIGATION    . uterine ablastion     and polyps removed    reports that she quit smoking about 5 years ago. Her smoking use included Cigarettes. She has a 8.50 pack-year smoking history. She has never used smokeless tobacco. She reports that she does not drink alcohol or use drugs. family history includes Alzheimer's disease in her mother; HIV in her brother; Hypertension in her father and mother; Lupus in her sister; Prostate cancer in her father. Allergies  Allergen Reactions  . Diovan Hct [Valsartan-Hydrochlorothiazide] Swelling    Had facial swelling and  throat itching after 2 doses of the generic had done well on the brand medicine for a while.  See ED visit February 2014  . Purified Water [Water, Sterile] Anaphylaxis  . Allegra [Fexofenadine] Hives  . Chocolate     triggers Asthma  . Cimetidine     REACTION: unspecified  . Citric Acid     REACTION: swelling, hives, itching  . Coly-Mycin S     REACTION: unspecified  . Peanut-Containing Drug Products Swelling  . Amlodipine Other (See Comments)    At 5 mg dose      Review of Systems  Constitutional: Negative for chills and fever.  HENT: Negative for congestion.   Respiratory: Positive for cough and wheezing.   Cardiovascular: Negative for chest pain.       Objective:   Physical Exam  Constitutional: She appears well-developed and well-nourished.  HENT:  Right Ear: External ear normal.  Left Ear: External ear normal.  Mouth/Throat: Oropharynx is clear and moist.  Neck: Neck supple.  Cardiovascular: Normal rate and regular rhythm.   Pulmonary/Chest: Effort normal and breath sounds normal. No respiratory distress. She has no rales.  Lymphadenopathy:    She has no cervical adenopathy.       Assessment:     Persistent cough in a patient with asthma with recent environmental irritant as above. She is  in no respiratory distress    Plan:     -Get back on Symbicort 2 puffs twice daily. She knows this is not a rescue inhaler and is more of a controlling medication. -Get prescription for albuterol previously given filled to use as needed -Prednisone 20 mg 2 tablets once daily for 5 days -Follow-up immediately for any fever or other concerns  Eulas Post MD [facility]

## 2016-08-21 NOTE — Telephone Encounter (Signed)
Appointment scheduled with Dr. Elease Hashimoto today at 1:45

## 2016-08-21 NOTE — Progress Notes (Signed)
Pre visit review using our clinic review tool, if applicable. No additional management support is needed unless otherwise documented below in the visit note. 

## 2016-08-21 NOTE — Telephone Encounter (Signed)
Patient Name: [name]  DOB: [birth year]    Initial Comment Caller states she's coughing. She has Acute bronchitis for three weeks. If someone comes by her with perfume, and now she can't stop coughing. She feels like she has sand in her chest when she breathes. Trouble breathing. This has been happening at work and she has to leave.   Nurse Assessment  Nurse: Leilani Merl, RN, Heather Date/Time (Eastern Time): 08/21/2016 11:27:11 AM  Confirm and document reason for call. If symptomatic, describe symptoms. You must click the next button to save text entered. ---Caller states she's coughing. She has Acute bronchitis for three weeks. If someone comes by her with perfume, and now she can't stop coughing. She feels like she has sand in her chest when she breathes. Trouble breathing. This has been happening at work and she has to leave.  Has the patient traveled out of the country within the last 30 days? ---Not Applicable  Does the patient have any new or worsening symptoms? ---Yes  Will a triage be completed? ---Yes  Related visit to physician within the last 2 weeks? ---No  Does the PT have any chronic conditions? (i.e. diabetes, asthma, etc.) ---Yes  List chronic conditions. ---See MR  Is the patient pregnant or possibly pregnant? (Ask all females between the ages of 52-55) ---No  Is this a behavioral health or substance abuse call? ---No     Guidelines    Guideline Title Affirmed Question Affirmed Notes  Cough - Acute Non-Productive Wheezing is present    Final Disposition User   See Physician within 4 Hours (or PCP triage) Leilani Merl, RN, Heather    Referrals  REFERRED TO PCP OFFICE   Disagree/Comply: Comply

## 2016-08-21 NOTE — Patient Instructions (Signed)
Get back on Symbicort 2 puffs twice daily Follow up for any fever or increased shortness of breath.

## 2016-11-23 ENCOUNTER — Emergency Department (HOSPITAL_COMMUNITY): Payer: Self-pay

## 2016-11-23 ENCOUNTER — Emergency Department (HOSPITAL_COMMUNITY)
Admission: EM | Admit: 2016-11-23 | Discharge: 2016-11-23 | Disposition: A | Discharge status: Home / Self Care | Payer: Self-pay | Attending: Emergency Medicine | Admitting: Emergency Medicine

## 2016-11-23 ENCOUNTER — Encounter (HOSPITAL_COMMUNITY): Payer: Self-pay

## 2016-11-23 DIAGNOSIS — W000XXA Fall on same level due to ice and snow, initial encounter: Secondary | ICD-10-CM | POA: Insufficient documentation

## 2016-11-23 DIAGNOSIS — Y999 Unspecified external cause status: Secondary | ICD-10-CM | POA: Insufficient documentation

## 2016-11-23 DIAGNOSIS — J45909 Unspecified asthma, uncomplicated: Secondary | ICD-10-CM | POA: Insufficient documentation

## 2016-11-23 DIAGNOSIS — Y929 Unspecified place or not applicable: Secondary | ICD-10-CM | POA: Insufficient documentation

## 2016-11-23 DIAGNOSIS — Y939 Activity, unspecified: Secondary | ICD-10-CM | POA: Insufficient documentation

## 2016-11-23 DIAGNOSIS — Z91010 Allergy to peanuts: Secondary | ICD-10-CM | POA: Insufficient documentation

## 2016-11-23 DIAGNOSIS — M25521 Pain in right elbow: Secondary | ICD-10-CM | POA: Insufficient documentation

## 2016-11-23 DIAGNOSIS — Z79899 Other long term (current) drug therapy: Secondary | ICD-10-CM | POA: Insufficient documentation

## 2016-11-23 DIAGNOSIS — Z87891 Personal history of nicotine dependence: Secondary | ICD-10-CM | POA: Insufficient documentation

## 2016-11-23 DIAGNOSIS — I10 Essential (primary) hypertension: Secondary | ICD-10-CM | POA: Insufficient documentation

## 2016-11-23 MED ORDER — NAPROXEN 500 MG PO TABS: 500.0000 mg | ORAL_TABLET | Freq: Two times a day (BID) | ORAL | 0 refills | Status: DC

## 2016-11-23 MED ORDER — NAPROXEN 500 MG PO TABS
500.0000 mg | ORAL_TABLET | Freq: Once | ORAL | Status: AC
  Administered 2016-11-23: 500 mg via ORAL
  Filled 2016-11-23: qty 1

## 2016-11-23 NOTE — ED Provider Notes (Signed)
[facility] DEPT Provider Note    By signing my name below, I, [name], attest that this documentation has been prepared under the direction and in the presence of Jordan Loan, PA-C. Electronically Signed: Bea Decker, ED Scribe. 11/23/16. 11:38 AM.    History   Chief Complaint Chief Complaint  Patient presents with  . Elbow Pain    The history is provided by the patient and medical records. No language interpreter was used.    [name] is a right hand dominant 51 y.o. female who presents to the Emergency Department complaining of sharp, stabbing right elbow pain that began about one month ago. She states she fell on ice directly on the elbow about one month ago but states the pain started getting better. She states she was shoveling snow about two days ago and the pain returned and worsened. She reports associated tingling of the right hand. She has not taken anything for pain. ROM and palpation of the right elbow increases the pain. Pt denies alleviating factors. She denies numbness or weakness of the RUE, bruising, wounds, fever, chills, nausea, vomiting.    Past Medical History:  Diagnosis Date  . 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   . Allergy   . Asthma   . GERD (gastroesophageal reflux disease)   . Hypertension   . Positive TB test   . TOBACCO USE 03/22/2009   Qualifier: Diagnosis of  By: Regis Bill MD, [name]  Stopped Dec 12 2010      Patient Active Problem List   Diagnosis Date Noted  . Conjunctivitis, acute 07/12/2013  . Headache 07/02/2013  . Other malaise and fatigue 06/02/2013  . Drug side effects 01/25/2013  . Redness of eye, left 11/24/2012  . Sleep deprivation 05/09/2012  . ASTHMA 09/29/2009  . HYPERTENSION 05/30/2009  . GERD 05/12/2008  . HOT FLASHES 05/12/2008  . 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 ALLERGIC RHINITIS 08/07/2007    Past Surgical History:  Procedure Laterality Date  . ABDOMINAL HYSTERECTOMY    . BREAST  SURGERY     cyst from left breast  . BUNIONECTOMY     both feet  . CHOLECYSTECTOMY    . CYSTECTOMY     left shoulder  . KNEE SURGERY Left   . PANENDOSCOPY    . TUBAL LIGATION    . uterine ablastion     and polyps removed    OB History    No data available       Home Medications    Prior to Admission medications   Medication Sig Start Date End Date Taking? Authorizing Provider  budesonide-formoterol (SYMBICORT) 80-4.5 MCG/ACT inhaler Inhale 2 puffs into the lungs 2 (two) times daily. 07/24/16   Burnis Medin, MD  naproxen (NAPROSYN) 500 MG tablet Take 1 tablet (500 mg total) by mouth 2 (two) times daily. 11/23/16   Jordan Loan, PA-C  predniSONE (DELTASONE) 20 MG tablet Take 2 tablets once daily for 5 days. 08/21/16   Eulas Post, MD    Family History Family History  Problem Relation Age of Onset  . Hypertension Mother   . Alzheimer's disease Mother   . Hypertension Father   . Prostate cancer Father   . HIV Brother   . Lupus Sister     Social History Social History  Substance Use Topics  . Smoking status: Former Smoker    Packs/day: 0.50    Years: 17.00    Types: Cigarettes    Quit  date: 12/12/2010  . Smokeless tobacco: Never Used  . Alcohol use No     Allergies   Diovan hct [valsartan-hydrochlorothiazide]; Purified water [water, sterile]; Allegra [fexofenadine]; Chocolate; Cimetidine; Citric acid; Coly-mycin s; Peanut-containing drug products; and Amlodipine   Review of Systems Review of Systems A complete 10 system review of systems was obtained and all systems are negative except as noted in the HPI and PMH.    Physical Exam Updated Vital Signs BP (!) 164/108 (BP Location: Left Arm)   Pulse 60   Temp 97.5 F (36.4 C) (Oral)   Resp 18   SpO2 100%   Physical Exam  Constitutional: She is oriented to person, place, and time. She appears well-developed and well-nourished.  HENT:  Head: Normocephalic and atraumatic.  Right Ear: External ear  normal.  Left Ear: External ear normal.  Eyes: Conjunctivae are normal. No scleral icterus.  Neck: No tracheal deviation present.  Cardiovascular:  Pulses:      Radial pulses are 2+ on the right side, and 2+ on the left side.  Pulmonary/Chest: Effort normal. No respiratory distress.  Abdominal: She exhibits no distension.  Musculoskeletal: Normal range of motion. She exhibits tenderness.  Right elbow TTP about medial and lateral epicondyle.  5/5 strength. Pain with resisted supination. Sensation intact to light touch.   Neurological: She is alert and oriented to person, place, and time.  Skin: Skin is warm and dry.  Psychiatric: She has a normal mood and affect. Her behavior is normal.     ED Treatments / Results  DIAGNOSTIC STUDIES: Oxygen Saturation is 100% on RA, normal by my interpretation.   COORDINATION OF CARE: 11:02 AM- Will X-Ray right elbow. Pt verbalizes understanding and agrees to plan.  Medications  naproxen (NAPROSYN) tablet 500 mg (500 mg Oral Given 11/23/16 1126)    Labs (all labs ordered are listed, but only abnormal results are displayed) Labs Reviewed - No data to display  EKG  EKG Interpretation None       Radiology Dg Elbow Complete Right  Result Date: 11/23/2016 CLINICAL DATA:  [place] in this no one month ago. Persistent elbow pain. EXAM: RIGHT ELBOW - COMPLETE 3+ VIEW COMPARISON:  None. FINDINGS: There is no evidence of fracture, dislocation, or joint effusion. There is no evidence of arthropathy or other focal bone abnormality. Soft tissues are unremarkable. IMPRESSION: Negative. Electronically Signed   By: Nelson Chimes M.D.   On: 11/23/2016 11:29    Procedures Procedures (including critical care time)  Medications Ordered in ED Medications  naproxen (NAPROSYN) tablet 500 mg (500 mg Oral Given 11/23/16 1126)     Initial Impression / Assessment and Plan / ED Course  I have reviewed the triage vital signs and the nursing notes.  Pertinent  labs & imaging results that were available during my care of the patient were reviewed by me and considered in my medical decision making (see chart for details).     Patient presents with right elbow pain.  Xrays normal.  Likely tendinitis.  Neurovascularly intact.  No signs of infection.  Plan to treat with NSAIDs, ice, and rest.  Recommend compression as well.  Follow up with orthopedics in 1-2 weeks.  Return precautions discussed.  Stable for discharge.   I personally performed the services described in this documentation, which was scribed in my presence. The recorded information has been reviewed and is accurate.   Final Clinical Impressions(s) / ED Diagnoses   Final diagnoses:  Right elbow pain  New Prescriptions New Prescriptions   NAPROXEN (NAPROSYN) 500 MG TABLET    Take 1 tablet (500 mg total) by mouth 2 (two) times daily.     Jordan Loan, PA-C 11/23/16 [place], MD 12/02/16 [phone]

## 2016-11-23 NOTE — Discharge Instructions (Signed)
Take naproxen twice daily.  Ice your elbow 10-20 minutes three times daily.  You may also try compression bands for elbow to help with pain.  If your symptoms continue, follow up with orthopedics in about 2 weeks.  Return to the ED for any new or concerning symptoms.

## 2016-11-23 NOTE — ED Triage Notes (Signed)
Pt fell during last snow storm.  Fell to rt shoulder and arm.  Pt c/o rt elbow and rt forearm pain.  Not improving.

## 2016-12-24 ENCOUNTER — Ambulatory Visit (INDEPENDENT_AMBULATORY_CARE_PROVIDER_SITE_OTHER)
Admission: RE | Admit: 2016-12-24 | Discharge: 2016-12-24 | Disposition: A | Discharge status: Home / Self Care | Payer: Self-pay | Source: Ambulatory Visit | Attending: Family Medicine | Admitting: Family Medicine

## 2016-12-24 ENCOUNTER — Encounter: Payer: Self-pay | Admitting: Family Medicine

## 2016-12-24 ENCOUNTER — Encounter: Payer: Self-pay | Admitting: *Deleted

## 2016-12-24 ENCOUNTER — Ambulatory Visit (INDEPENDENT_AMBULATORY_CARE_PROVIDER_SITE_OTHER): Payer: Self-pay | Admitting: Family Medicine

## 2016-12-24 VITALS — BP 142/100 | HR 65 | Temp 98.3°F | Ht 60.0 in | Wt 136.7 lb

## 2016-12-24 DIAGNOSIS — I10 Essential (primary) hypertension: Secondary | ICD-10-CM

## 2016-12-24 DIAGNOSIS — R05 Cough: Secondary | ICD-10-CM

## 2016-12-24 DIAGNOSIS — J4541 Moderate persistent asthma with (acute) exacerbation: Secondary | ICD-10-CM

## 2016-12-24 DIAGNOSIS — R059 Cough, unspecified: Secondary | ICD-10-CM

## 2016-12-24 MED ORDER — BENZONATATE 100 MG PO CAPS: 100.0000 mg | ORAL_CAPSULE | Freq: Three times a day (TID) | ORAL | 0 refills | Status: DC | PRN

## 2016-12-24 MED ORDER — PREDNISONE 20 MG PO TABS: 40.0000 mg | ORAL_TABLET | Freq: Every day | ORAL | 0 refills | Status: DC

## 2016-12-24 MED ORDER — DOXYCYCLINE HYCLATE 100 MG PO TABS: 100.0000 mg | ORAL_TABLET | Freq: Two times a day (BID) | ORAL | 0 refills | Status: DC

## 2016-12-24 MED FILL — BENZONATATE 100 MG CAP: 100 | 6 days supply | Qty: 20 | Fill #0

## 2016-12-24 MED FILL — DOXYCYCLINE HYCLATE 100 MG: 100 | 10 days supply | Qty: 20 | Fill #0

## 2016-12-24 MED FILL — predniSONE 20 MG TABS: 20 | 4 days supply | Qty: 8 | Fill #0

## 2016-12-24 NOTE — Progress Notes (Addendum)
HPI:  Acute visit for cough: -started: 5 days ago -symptoms:nasal congestion, sore throat, cough - productive of brown and green mucus, wheezing at times, used inhaler yesterday, occ mild SOB -denies:fever, body aches NVD, tooth pain, sinus pain, ear pain -has tried: her inhaler once  -sick contacts/travel/risks: no reported flu, strep or tick exposure -Hx of: allergies ROS: See pertinent positives and negatives per HPI.  Past Medical History:  Diagnosis Date  . Chicken pox   . Hepatitis B    doesnt t hink she is a carrier.   . Measles   . Osteoporosis     Past Surgical History:  Procedure Laterality Date  . CESAREAN SECTION    . Gardner duct cyst    . Imperferated hymen    . TONSILLECTOMY AND ADENOIDECTOMY    . TUBAL LIGATION      Family History  Problem Relation Age of Onset  . Arthritis Mother   . Hypertension Mother   . Arthritis Father   . Cancer Father     Prostate  . Heart disease Father   . Hypertension Father   . Diabetes Father     Social History   Social History  . Marital status: Divorced    Spouse name: N/A  . Number of children: N/A  . Years of education: N/A   Social History Main Topics  . Smoking status: Light Tobacco Smoker  . Smokeless tobacco: Never Used  . Alcohol use Yes     Comment: Social use  . Drug use: Unknown  . Sexual activity: Not Asked   Other Topics Concern  . 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   2 pregnancies    ocass tobacco    Last coon 2006     Current Outpatient Prescriptions:  .  Ascorbic Acid (VITAMIN C) 1000 MG tablet, Take 1,000 mg by mouth daily., Disp: , Rfl:  .  Calcium Carb-Cholecalciferol (CALCIUM + D3 PO), Take by mouth. 600 mg calcium and 8oo iu of vitamin d.  Taking  bid., Disp: , Rfl:  .  Cholecalciferol (VITAMIN D3) 2000 UNITS TABS, Take by mouth., Disp: , Rfl:  .  Multiple Vitamins-Minerals (CENTRUM SILVER PO), Take by mouth., Disp: , Rfl:  .  doxycycline (VIBRA-TABS) 100 MG tablet, Take 1 tablet (100 mg total) by mouth 2 (two) times daily., Disp: 14 tablet, Rfl: 0 .  predniSONE (DELTASONE) 20 MG tablet, Take 2 tablets (40 mg total) by mouth daily with breakfast., Disp: 8 tablet, Rfl: 0  EXAM:  Vitals:   12/24/16 1332  BP: (!) 142/102  Pulse: 65  Temp: 98.3 F (36.8 C)    Body mass index is 29.44 kg/m.  GENERAL: vitals reviewed and listed above, alert, oriented, appears well hydrated and in no acute distress  HEENT: atraumatic, conjunttiva clear, no obvious abnormalities on inspection of external nose and ears, normal appearance of ear canals and TMs, clear nasal congestion, mild post oropharyngeal erythema with PND, no tonsillar edema or exudate, no sinus TTP  NECK: no obvious masses on inspection  LUNGS: clear to auscultation bilaterally, no wheezes, rales or rhonchi, good air movement, ? Decreased breath sound bases  CV: HRRR, no  peripheral edema  MS: moves all extremities without noticeable abnormality  PSYCH: pleasant and cooperative, no obvious depression or anxiety  ASSESSMENT AND PLAN:  Discussed the following assessment and plan:  Respiratory infection - Plan: DG Chest 2 View  Discussed potential etiologies. Asthma exacerbation with bronchitis, CAP or URI. Since asthma symptoms, increased discolored mucus production will tx with prednisone and abx after discussion risks. Since reported darker brown mucus will also get CXR. Influenza also a possibility. Out of treatment window for tamiflu and does not have reported fevers or body aches.  We discussed treatment side effects, likely course, transmission, and signs of developing a serious illness. -of course, we advised to return or notify a doctor immediately if symptoms worsen  or persist or new concerns arise.    Patient Instructions  BEFORE YOU LEAVE: -xray sheet -work not, seen today and may return to work tomorrow if able  Go get the xray.  I sent tessalon to the pharmacy for cough, prednisone for the asthma and an antibiotic.  I hope you are feeling better soon!  Please seek care if worsening, new concerns or not improving with treatment.    Colin Benton R., DO

## 2016-12-24 NOTE — Patient Instructions (Addendum)
BEFORE YOU LEAVE: -xray sheet -recheck BP and schedule 1 month follow up with PCP if remains elevated -work not, seen today and may return to work tomorrow if able  Go get the xray.  I sent tessalon to the pharmacy for cough, prednisone for the asthma and an antibiotic.  I hope you are feeling better soon!  Please seek care if worsening, new concerns or not improving with treatment.

## 2016-12-24 NOTE — Progress Notes (Signed)
Pre visit review using our clinic review tool, if applicable. No additional management support is needed unless otherwise documented below in the visit note. 

## 2016-12-26 ENCOUNTER — Ambulatory Visit (INDEPENDENT_AMBULATORY_CARE_PROVIDER_SITE_OTHER): Payer: Self-pay | Admitting: Internal Medicine

## 2016-12-26 VITALS — BP 162/110 | HR 55 | Temp 98.0°F | Ht 60.0 in | Wt 137.1 lb

## 2016-12-26 DIAGNOSIS — J989 Respiratory disorder, unspecified: Secondary | ICD-10-CM

## 2016-12-26 DIAGNOSIS — I10 Essential (primary) hypertension: Secondary | ICD-10-CM

## 2016-12-26 DIAGNOSIS — H109 Unspecified conjunctivitis: Secondary | ICD-10-CM

## 2016-12-26 MED ORDER — DILTIAZEM HCL ER COATED BEADS 120 MG PO CP24: 120.0000 mg | ORAL_CAPSULE | Freq: Every day | ORAL | 2 refills | Status: DC

## 2016-12-26 MED ORDER — POLYMYXIN B-TRIMETHOPRIM 10000-0.1 UNIT/ML-% OP SOLN: 1.0000 [drp] | Freq: Four times a day (QID) | OPHTHALMIC | 0 refills | Status: DC

## 2016-12-26 MED FILL — POLYMYXIN B/TMP EYE DROPS: 10000-0.1 | 50 days supply | Qty: 10 | Fill #0

## 2016-12-26 MED FILL — CARTIA XT 120 MG CAPSULE SA: 120 | 30 days supply | Qty: 30 | Fill #0

## 2016-12-26 NOTE — Progress Notes (Signed)
Chief Complaint  Patient presents with  . Itchy Eye  . Sore Throat    started last week     HPI: [name] 51 y.o.   Seen for asthma flare  2 20   Dr Maudie Mercury for resp infection  X ray discussed   Now here today with  Nl x ray   Cause developed crusty red eyes  without fever.but has chills   Throat trachea still hurts . Cough up brown stuff. n o sinus pain worse.  Daughter also has pink eye w dc recently  Feels  bp up cause had salty food yesterday.  No cp sob otherwise . ROS: See pertinent positives and negatives per HPI. No cp sob  Pos malaise   Past Medical History:  Diagnosis Date  . 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   . Allergy   . Asthma   . GERD (gastroesophageal reflux disease)   . Hypertension   . Positive TB test   . TOBACCO USE 03/22/2009   Qualifier: Diagnosis of  By: Regis Bill MD, [name]  Stopped Dec 12 2010      Family History  Problem Relation Age of Onset  . Hypertension Mother   . Alzheimer's disease Mother   . Hypertension Father   . Prostate cancer Father   . HIV Brother   . Lupus Sister     Social History   Social History  . Marital status: Divorced    Spouse name: N/A  . Number of children: N/A  . Years of education: N/A   Social History Main Topics  . Smoking status: Former Smoker    Packs/day: 0.50    Years: 17.00    Types: Cigarettes    Quit date: 12/12/2010  . Smokeless tobacco: Never Used  . Alcohol use No  . Drug use: No  . Sexual activity: Not on file   Other Topics Concern  . Not on file   Social History Narrative   Divorced    Tobacco since age 45 stopped feb 2012   Works  [employer]ner, broadcasting/film/video.Patient account REP    Outpatient Medications Prior to Visit  Medication Sig Dispense Refill  . budesonide-formoterol (SYMBICORT) 80-4.5 MCG/ACT inhaler Inhale 2 puffs into the lungs 2 (two) times daily. 1 Inhaler 3  . doxycycline (VIBRA-TABS) 100 MG tablet Take 1 tablet (100 mg total) by mouth 2  (two) times daily. 20 tablet 0  . predniSONE (DELTASONE) 20 MG tablet Take 2 tablets (40 mg total) by mouth daily with breakfast. 8 tablet 0  . naproxen (NAPROSYN) 500 MG tablet Take 1 tablet (500 mg total) by mouth 2 (two) times daily. (Patient not taking: Reported on 12/26/2016) 30 tablet 0  . benzonatate (TESSALON PERLES) 100 MG capsule Take 1 capsule (100 mg total) by mouth 3 (three) times daily as needed. 20 capsule 0   No facility-administered medications prior to visit.      EXAM:  BP (!) 162/110 (BP Location: Left Arm, Patient Position: Sitting, Cuff Size: Normal)   Pulse (!) 55   Temp 98 F (36.7 C) (Oral)   Ht 5' (1.524 m)   Wt 137 lb 1.6 oz (62.2 kg)   SpO2 98%   BMI 26.78 kg/m   Body mass index is 26.78 kg/m. WDWN in NAD  quiet respirations; mildly congested  somewhat hoarse. Non toxic . HEENT: Normocephalic ;atraumatic , Eyes;  PERRL, EOMs  Full, lids and conjunctiva mild  Injection bilateral ,,  Ears: no deformities, canals nl, TM landmarks normal, Nose: no deformity or discharge but congested;face minimally tender Mouth : OP clear without lesion or edema . Neck: Supple without adenopathy or masses or bruits Chest:  Clear to A&P without wheezes rales or rhonchi CV:  S1-S2 no gallops or murmurs peripheral perfusion is normal Skin :nl perfusion and no acute rash Repeat bp 167/100 r arm  BP Readings from Last 3 Encounters:  12/26/16 (!) 162/110  12/24/16 (!) 142/100  11/23/16 (!) 164/108    ASSESSMENT AND PLAN:  Discussed the following assessment and plan:  Respiratory illness  Conjunctivitis of both eyes, unspecified conjunctivitis type  Essential hypertension Suspect  Poss adenovirus for severity  And now conjunctivitis .   Ok to continue meds  Can add eye drop if helps her but may not make a difference. More concerned about bp readings  Hx of control with lsi and had se angioedema? With valsartan hctz and some minor se of norvasc   Will try  Cardizem  And plan  fu 3 weeks about the bp readings Note for work communicable ? Back after weekend -Patient advised to return or notify health care team  if symptoms worsen ,persist or new concerns arise.  Patient Instructions  With your conjunctivitis is is possible this illness is adenovirus or other viral illness however because of the crusting we can add an antibiotic eyedrop.  Your blood pressure is too high the last 3 times in the office. You probably should go back on blood pressure medication. Can try a medicine that you haven't tried before. Do not take decongestant cold medicines as it will raise her blood pressure.  Please make a follow-up visit in 3-4 weeks with blood pressure monitor and readings.      [name]. Linkyn Gobin M.D.

## 2016-12-26 NOTE — Patient Instructions (Addendum)
With your conjunctivitis is is possible this illness is adenovirus or other viral illness however because of the crusting we can add an antibiotic eyedrop.  Your blood pressure is too high the last 3 times in the office. You probably should go back on blood pressure medication. Can try a medicine that you haven't tried before. Do not take decongestant cold medicines as it will raise her blood pressure.  Please make a follow-up visit in 3-4 weeks with blood pressure monitor and readings.

## 2017-01-09 ENCOUNTER — Ambulatory Visit (INDEPENDENT_AMBULATORY_CARE_PROVIDER_SITE_OTHER): Payer: Self-pay | Admitting: Orthopedic Surgery

## 2017-01-22 ENCOUNTER — Ambulatory Visit (INDEPENDENT_AMBULATORY_CARE_PROVIDER_SITE_OTHER): Payer: Self-pay | Admitting: Orthopedic Surgery

## 2017-01-22 ENCOUNTER — Ambulatory Visit (INDEPENDENT_AMBULATORY_CARE_PROVIDER_SITE_OTHER): Payer: Self-pay

## 2017-01-22 ENCOUNTER — Encounter (INDEPENDENT_AMBULATORY_CARE_PROVIDER_SITE_OTHER): Payer: Self-pay | Admitting: Orthopedic Surgery

## 2017-01-22 DIAGNOSIS — M79601 Pain in right arm: Secondary | ICD-10-CM

## 2017-01-22 DIAGNOSIS — M79602 Pain in left arm: Secondary | ICD-10-CM

## 2017-01-22 NOTE — Progress Notes (Signed)
Office Visit Note   Patient: [name]           Date of Birth: 07/02/1966           MRN: [id number] Visit Date: 01/22/2017 Requested by: Burnis Medin, MD [address], [place] [address] PCP: Lottie Dawson, MD  Subjective: Chief Complaint  Patient presents with  . Right Elbow - Pain  . Left Elbow - Pain    HPI: [name] is a 51 year old patient with bilateral elbow pain which is been ongoing for months.  Patient describes constant pain with throbbing.  The pain will shoot down the arms.  She has difficulty picking things up.  She denies any numbness and tingling.  She states her right hand will get cold.  She is right-hand dominant.  She denies any neck pain.  She was seen in January at the emergency room for right elbow pain.  Radiographs at that time were normal.  They are both much worse since then.  She denies any other orthopedic complaints.  She works at the [employer].  She does use bilateral braces.  She cannot pick things up.  Symptoms been worse since January.  She denies any neck symptoms.  Symptoms are worse in the morning              ROS: All systems reviewed are negative as they relate to the chief complaint within the history of present illness.  Patient denies  fevers or chills.   Assessment & Plan: Visit Diagnoses:  1. Bilateral arm pain     Plan: Impression is bilateral elbow pain which is not really following a pattern of tennis elbow or radicular pain from the neck.  She does state that she has a history of having some C7-T1 neck issues which she remembered on her way out the door.  Nonetheless I want to try her with some Pen[place] samples.  Also want to get left elbow MRI as well as nerve conduction rule out radial tunnel syndrome.  I'll see her back after the studies.  Her pain is way out of proportion to her exam findings and radiographic findings.  Follow-Up Instructions: No Follow-up on file.   Orders:  No orders of the defined types were  placed in this encounter.  No orders of the defined types were placed in this encounter.     Procedures: No procedures performed   Clinical Data: No additional findings.  Objective: Vital Signs: There were no vitals taken for this visit.  Physical Exam:   Constitutional: Patient appears well-developed HEENT:  Head: Normocephalic Eyes:EOM are normal Neck: Normal range of motion Cardiovascular: Normal rate Pulmonary/chest: Effort normal Neurologic: Patient is alert Skin: Skin is warm Psychiatric: Patient has normal mood and affect    Ortho Exam: Orthopedic exam demonstrates that her neck has full active and passive range of motion she has 5 out of 5 grip EPL FPL interosseous wrist flexion-extension biceps triceps and upper strength bilaterally.  She has palpable radial pulses.  She does have some pain localizing to the lateral epicondyles to direct palpation bilaterally.  Slightly worse with extension.  No discrete tenderness in the radial tunnel on either side.  Ulnar nerve does not subluxate on either side.  Elbow range of motion is full to flexion extension pronation supination.  No other masses lymph adenopathy or skin changes noted in the elbow region.  No paresthesias in the hand.  Specialty Comments:  No specialty comments available.  Imaging: No  results found.   PMFS History: Patient Active Problem List   Diagnosis Date Noted  . Conjunctivitis, acute 07/12/2013  . Headache 07/02/2013  . Other malaise and fatigue 06/02/2013  . Drug side effects 01/25/2013  . Redness of eye, left 11/24/2012  . Sleep deprivation 05/09/2012  . ASTHMA 09/29/2009  . Essential hypertension 05/30/2009  . GERD 05/12/2008  . HOT FLASHES 05/12/2008  . 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 ALLERGIC RHINITIS 08/07/2007   Past Medical History:  Diagnosis Date  . 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   . Allergy   . Asthma   . GERD (gastroesophageal reflux disease)   .  Hypertension   . Positive TB test   . TOBACCO USE 03/22/2009   Qualifier: Diagnosis of  By: Regis Bill MD, [name]  Stopped Dec 12 2010      Family History  Problem Relation Age of Onset  . Hypertension Mother   . Alzheimer's disease Mother   . Hypertension Father   . Prostate cancer Father   . HIV Brother   . Lupus Sister     Past Surgical History:  Procedure Laterality Date  . ABDOMINAL HYSTERECTOMY    . BREAST SURGERY     cyst from left breast  . BUNIONECTOMY     both feet  . CHOLECYSTECTOMY    . CYSTECTOMY     left shoulder  . KNEE SURGERY Left   . PANENDOSCOPY    . TUBAL LIGATION    . uterine ablastion     and polyps removed   Social History   Occupational History  . Not on file.   Social History Main Topics  . Smoking status: Former Smoker    Packs/day: 0.50    Years: 17.00    Types: Cigarettes    Quit date: 12/12/2010  . Smokeless tobacco: Never Used  . Alcohol use No  . Drug use: No  . Sexual activity: Not on file

## 2017-02-06 ENCOUNTER — Encounter (INDEPENDENT_AMBULATORY_CARE_PROVIDER_SITE_OTHER): Payer: Self-pay | Admitting: Physical Medicine and Rehabilitation

## 2017-02-06 ENCOUNTER — Ambulatory Visit (INDEPENDENT_AMBULATORY_CARE_PROVIDER_SITE_OTHER): Payer: Self-pay | Admitting: Physical Medicine and Rehabilitation

## 2017-02-06 DIAGNOSIS — M25521 Pain in right elbow: Secondary | ICD-10-CM

## 2017-02-06 DIAGNOSIS — M25522 Pain in left elbow: Secondary | ICD-10-CM

## 2017-02-06 DIAGNOSIS — M79602 Pain in left arm: Secondary | ICD-10-CM

## 2017-02-06 DIAGNOSIS — M79601 Pain in right arm: Secondary | ICD-10-CM

## 2017-02-06 NOTE — Progress Notes (Signed)
[name] - 51 y.o. female MRN [id number]  Date of birth: 01-16-1966  Office Visit Note: Visit Date: 02/06/2017 PCP: Lottie Dawson, MD Referred by: Burnis Medin, MD  Subjective: Chief Complaint  Patient presents with  . Right Arm - Pain  . Left Arm - Pain   HPI: [name] is a 52 year old right-hand dominant female with several months of bilateral arm and hand and wrist pain. She states that it started in the right hand and she was actually seen in the emergency room with negative x-rays. Actually this is more in the right elbow and now it is actually worse in the left elbow. Symptoms are worsened since January. She does work at the [employer]. She does wear bilateral hand splints. She reports pain that shoots down from the elbow towards the hand in a dorsal lateral aspect. She has trouble gripping objects in says of the right hand gets cold at times. Her pain is constant and not worse at any specific time. She denies any numbness or tingling. She has not had prior electrodiagnostic studies.    ROS Otherwise per HPI.  Assessment & Plan: Visit Diagnoses:  1. Pain in both upper extremities   2. Pain in left elbow   3. Pain in right elbow     Plan: No additional findings.  Impression: The above electrodiagnostic study is ABNORMAL and reveals evidence of: 1.  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 Careful clinical correlation is paramount.  2.  A mild left median nerve entrapment at the wrist affecting sensory components. Careful clinical correlation is paramount.  There is no significant electrodiagnostic evidence of any other focal nerve entrapment, brachial plexopathy or cervical radiculopathy.   Recommendations: 1.  Follow-up with referring physician. 2.  Continue current management of symptoms. May want to consider diagnostic carpal tunnel injection.   Meds & Orders: No orders of the defined types  were placed in this encounter.   Orders Placed This Encounter  Procedures  . NCV with EMG (electromyography)    Follow-up: Return for Dr. Marlou Sa appointment.   Procedures: No procedures performed  EMG & NCV Findings: Evaluation of the left median (across palm) sensory nerve showed prolonged distal peak latency (Wrist, 4.0 ms).  The right median (across palm) sensory nerve showed no response (Palm) and prolonged distal peak latency (4.2 ms).  All remaining nerves (as indicated in the following tables) were within normal limits.  All left vs. right side differences were within normal limits.    The muscle scoring table definition stored in the current test does not match the sentence generator setup.  The sentence could not be generated.  Impression: The above electrodiagnostic study is ABNORMAL and reveals evidence of: 1.  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 Careful clinical correlation is paramount.  2.  A mild left median nerve entrapment at the wrist affecting sensory components. Careful clinical correlation is paramount.  There is no significant electrodiagnostic evidence of any other focal nerve entrapment, brachial plexopathy or cervical radiculopathy.   Recommendations: 1.  Follow-up with referring physician. 2.  Continue current management of symptoms. May want to consider diagnostic carpal tunnel injection.   Nerve Conduction Studies Anti Sensory Summary Table   Stim Site NR Peak (ms) Norm Peak (ms) P-T Amp (V) Norm P-T Amp Site1 Site2 Delta-P (ms) Dist (cm) Vel (m/s) Norm Vel (m/s)  Left Median  Acr Palm Anti Sensory (2nd Digit)  32C  Wrist    *4.0 <3.6 51.9 >10 Wrist Palm 2.2 0.0    Palm    1.8 <2.0 14.9         Right Median Acr Palm Anti Sensory (2nd Digit)  31.9C  Wrist    *4.2 <3.6 42.1 >10 Wrist Palm  0.0    Palm *NR  <2.0          Left Radial Anti Sensory (Base 1st Digit)  34C  Wrist    2.2 <3.1  24.0  Wrist Base 1st Digit 2.2 0.0    Right Radial Anti Sensory (Base 1st Digit)  34.7C  Wrist    2.3 <3.1 26.0  Wrist Base 1st Digit 2.3 0.0    Left Ulnar Anti Sensory (5th Digit)  32.6C  Wrist    3.7 <3.7 40.9 >15.0 Wrist 5th Digit 3.7 14.0 38 >38  Right Ulnar Anti Sensory (5th Digit)  32.5C  Wrist    3.4 <3.7 42.9 >15.0 Wrist 5th Digit 3.4 14.0 41 >38   Motor Summary Table   Stim Site NR Onset (ms) Norm Onset (ms) O-P Amp (mV) Norm O-P Amp Site1 Site2 Delta-0 (ms) Dist (cm) Vel (m/s) Norm Vel (m/s)  Left Median Motor (Abd Poll Brev)  33.7C  Wrist    3.8 <4.2 9.3 >5 Elbow Wrist 3.7 20.0 54 >50  Elbow    7.5  9.3         Right Median Motor (Abd Poll Brev)  33.7C  Wrist    4.2 <4.2 7.7 >5 Elbow Wrist 4.0 20.0 50 >50  Elbow    8.2  3.3         Left Ulnar Motor (Abd Dig Min)  33.5C  Wrist    2.6 <4.2 11.7 >3 B Elbow Wrist 3.4 20.0 59 >53  B Elbow    6.0  11.8  A Elbow B Elbow 1.3 10.0 77 >53  A Elbow    7.3  12.0         Right Ulnar Motor (Abd Dig Min)  32.2C  Wrist    2.8 <4.2 14.4 >3 B Elbow Wrist 3.3 20.0 61 >53  B Elbow    6.1  14.4  A Elbow B Elbow 1.2 10.0 83 >53  A Elbow    7.3  14.4           EMG   Side Muscle Nerve Root Ins Act Fibs Psw Amp Dur Poly Recrt Int Fraser Din Comment  Left 1stDorInt Ulnar C8-T1 Nml Nml Nml Nml Nml 0 Nml Nml   Left Abd Poll Brev Median C8-T1 Nml Nml Nml Nml Nml 0 Nml Nml   Left ExtDigCom   Nml Nml Nml Nml Nml 0 Nml Nml   Left Triceps Radial C6-7-8 Nml Nml Nml Nml Nml 0 Nml Nml   Left Deltoid Axillary C5-6 Nml Nml Nml Nml Nml 0 Nml Nml     Nerve Conduction Studies Anti Sensory Left/Right Comparison   Stim Site L Lat (ms) R Lat (ms) L-R Lat (ms) L Amp (V) R Amp (V) L-R Amp (%) Site1 Site2 L Vel (m/s) R Vel (m/s) L-R Vel (m/s)  Median Acr Palm Anti Sensory (2nd Digit)  32C  Wrist *4.0 *4.2 0.2 51.9 42.1 18.9 Wrist Palm     Palm 1.8   14.9         Radial Anti Sensory (Base 1st Digit)  34C  Wrist 2.2 2.3 0.1 24.0 26.0 7.7 Wrist  Base 1st  Digit     Ulnar Anti Sensory (5th Digit)  32.6C  Wrist 3.7 3.4 0.3 40.9 42.9 4.7 Wrist 5th Digit 38 41 3   Motor Left/Right Comparison   Stim Site L Lat (ms) R Lat (ms) L-R Lat (ms) L Amp (mV) R Amp (mV) L-R Amp (%) Site1 Site2 L Vel (m/s) R Vel (m/s) L-R Vel (m/s)  Median Motor (Abd Poll Brev)  33.7C  Wrist 3.8 4.2 0.4 9.3 7.7 17.2 Elbow Wrist 54 50 4  Elbow 7.5 8.2 0.7 9.3 3.3 64.5       Ulnar Motor (Abd Dig Min)  33.5C  Wrist 2.6 2.8 0.2 11.7 14.4 18.8 B Elbow Wrist 59 61 2  B Elbow 6.0 6.1 0.1 11.8 14.4 18.1 A Elbow B Elbow 77 83 6  A Elbow 7.3 7.3 0.0 12.0 14.4 16.7             Clinical History: No specialty comments available.  She reports that she quit smoking about 6 years ago. Her smoking use included Cigarettes. She has a 8.50 pack-year smoking history. She has never used smokeless tobacco. No results for input(s): HGBA1C, LABURIC in the last 8760 hours.  Objective:  VS:  HT:    WT:   BMI:     BP:   HR: bpm  TEMP: ( )  RESP:  Physical Exam  Ortho Exam Imaging: No results found.  Past Medical/Family/Surgical/Social History: Medications & Allergies reviewed per EMR Patient Active Problem List   Diagnosis Date Noted  . Conjunctivitis, acute 07/12/2013  . Headache 07/02/2013  . Other malaise and fatigue 06/02/2013  . Drug side effects 01/25/2013  . Redness of eye, left 11/24/2012  . Sleep deprivation 05/09/2012  . ASTHMA 09/29/2009  . Essential hypertension 05/30/2009  . GERD 05/12/2008  . HOT FLASHES 05/12/2008  . 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 ALLERGIC RHINITIS 08/07/2007   Past Medical History:  Diagnosis Date  . 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   . Allergy   . Asthma   . GERD (gastroesophageal reflux disease)   . Hypertension   . Positive TB test   . TOBACCO USE 03/22/2009   Qualifier: Diagnosis of  By: Regis Bill MD, [name]  Stopped Dec 12 2010     Family History  Problem Relation Age of Onset  . Hypertension Mother   .  Alzheimer's disease Mother   . Hypertension Father   . Prostate cancer Father   . HIV Brother   . Lupus Sister    Past Surgical History:  Procedure Laterality Date  . ABDOMINAL HYSTERECTOMY    . BREAST SURGERY     cyst from left breast  . BUNIONECTOMY     both feet  . CHOLECYSTECTOMY    . CYSTECTOMY     left shoulder  . KNEE SURGERY Left   . PANENDOSCOPY    . TUBAL LIGATION    . uterine ablastion     and polyps removed   Social History   Occupational History  . Not on file.   Social History Main Topics  . Smoking status: Former Smoker    Packs/day: 0.50    Years: 17.00    Types: Cigarettes    Quit date: 12/12/2010  . Smokeless tobacco: Never Used  . Alcohol use No  . Drug use: No  . Sexual activity: Not on file

## 2017-02-10 MED FILL — CARTIA XT 120 MG CAPSULE SA: 120 | 30 days supply | Qty: 30 | Fill #1

## 2017-02-10 NOTE — Procedures (Signed)
EMG & NCV Findings: Evaluation of the left median (across palm) sensory nerve showed prolonged distal peak latency (Wrist, 4.0 ms).  The right median (across palm) sensory nerve showed no response (Palm) and prolonged distal peak latency (4.2 ms).  All remaining nerves (as indicated in the following tables) were within normal limits.  All left vs. right side differences were within normal limits.    The muscle scoring table definition stored in the current test does not match the sentence generator setup.  The sentence could not be generated.  Impression: The above electrodiagnostic study is ABNORMAL and reveals evidence of: 1.  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 Careful clinical correlation is paramount.  2.  A mild left median nerve entrapment at the wrist affecting sensory components. Careful clinical correlation is paramount.  There is no significant electrodiagnostic evidence of any other focal nerve entrapment, brachial plexopathy or cervical radiculopathy.   Recommendations: 1.  Follow-up with referring physician. 2.  Continue current management of symptoms. May want to consider diagnostic carpal tunnel injection.   Nerve Conduction Studies Anti Sensory Summary Table   Stim Site NR Peak (ms) Norm Peak (ms) P-T Amp (V) Norm P-T Amp Site1 Site2 Delta-P (ms) Dist (cm) Vel (m/s) Norm Vel (m/s)  Left Median Acr Palm Anti Sensory (2nd Digit)  32C  Wrist    *4.0 <3.6 51.9 >10 Wrist Palm 2.2 0.0    Palm    1.8 <2.0 14.9         Right Median Acr Palm Anti Sensory (2nd Digit)  31.9C  Wrist    *4.2 <3.6 42.1 >10 Wrist Palm  0.0    Palm *NR  <2.0          Left Radial Anti Sensory (Base 1st Digit)  34C  Wrist    2.2 <3.1 24.0  Wrist Base 1st Digit 2.2 0.0    Right Radial Anti Sensory (Base 1st Digit)  34.7C  Wrist    2.3 <3.1 26.0  Wrist Base 1st Digit 2.3 0.0    Left Ulnar Anti Sensory (5th Digit)  32.6C  Wrist     3.7 <3.7 40.9 >15.0 Wrist 5th Digit 3.7 14.0 38 >38  Right Ulnar Anti Sensory (5th Digit)  32.5C  Wrist    3.4 <3.7 42.9 >15.0 Wrist 5th Digit 3.4 14.0 41 >38   Motor Summary Table   Stim Site NR Onset (ms) Norm Onset (ms) O-P Amp (mV) Norm O-P Amp Site1 Site2 Delta-0 (ms) Dist (cm) Vel (m/s) Norm Vel (m/s)  Left Median Motor (Abd Poll Brev)  33.7C  Wrist    3.8 <4.2 9.3 >5 Elbow Wrist 3.7 20.0 54 >50  Elbow    7.5  9.3         Right Median Motor (Abd Poll Brev)  33.7C  Wrist    4.2 <4.2 7.7 >5 Elbow Wrist 4.0 20.0 50 >50  Elbow    8.2  3.3         Left Ulnar Motor (Abd Dig Min)  33.5C  Wrist    2.6 <4.2 11.7 >3 B Elbow Wrist 3.4 20.0 59 >53  B Elbow    6.0  11.8  A Elbow B Elbow 1.3 10.0 77 >53  A Elbow    7.3  12.0         Right Ulnar Motor (Abd Dig Min)  32.2C  Wrist    2.8 <4.2 14.4 >3 B Elbow Wrist  3.3 20.0 61 >53  B Elbow    6.1  14.4  A Elbow B Elbow 1.2 10.0 83 >53  A Elbow    7.3  14.4           EMG   Side Muscle Nerve Root Ins Act Fibs Psw Amp Dur Poly Recrt Int Fraser Din Comment  Left 1stDorInt Ulnar C8-T1 Nml Nml Nml Nml Nml 0 Nml Nml   Left Abd Poll Brev Median C8-T1 Nml Nml Nml Nml Nml 0 Nml Nml   Left ExtDigCom   Nml Nml Nml Nml Nml 0 Nml Nml   Left Triceps Radial C6-7-8 Nml Nml Nml Nml Nml 0 Nml Nml   Left Deltoid Axillary C5-6 Nml Nml Nml Nml Nml 0 Nml Nml     Nerve Conduction Studies Anti Sensory Left/Right Comparison   Stim Site L Lat (ms) R Lat (ms) L-R Lat (ms) L Amp (V) R Amp (V) L-R Amp (%) Site1 Site2 L Vel (m/s) R Vel (m/s) L-R Vel (m/s)  Median Acr Palm Anti Sensory (2nd Digit)  32C  Wrist *4.0 *4.2 0.2 51.9 42.1 18.9 Wrist Palm     Palm 1.8   14.9         Radial Anti Sensory (Base 1st Digit)  34C  Wrist 2.2 2.3 0.1 24.0 26.0 7.7 Wrist Base 1st Digit     Ulnar Anti Sensory (5th Digit)  32.6C  Wrist 3.7 3.4 0.3 40.9 42.9 4.7 Wrist 5th Digit 38 41 3   Motor Left/Right Comparison   Stim Site L Lat (ms) R Lat (ms) L-R Lat (ms) L Amp (mV) R Amp  (mV) L-R Amp (%) Site1 Site2 L Vel (m/s) R Vel (m/s) L-R Vel (m/s)  Median Motor (Abd Poll Brev)  33.7C  Wrist 3.8 4.2 0.4 9.3 7.7 17.2 Elbow Wrist 54 50 4  Elbow 7.5 8.2 0.7 9.3 3.3 64.5       Ulnar Motor (Abd Dig Min)  33.5C  Wrist 2.6 2.8 0.2 11.7 14.4 18.8 B Elbow Wrist 59 61 2  B Elbow 6.0 6.1 0.1 11.8 14.4 18.1 A Elbow B Elbow 77 83 6  A Elbow 7.3 7.3 0.0 12.0 14.4 16.7

## 2017-02-26 ENCOUNTER — Ambulatory Visit
Admission: RE | Admit: 2017-02-26 | Discharge: 2017-02-26 | Disposition: A | Discharge status: Home / Self Care | Payer: No Typology Code available for payment source | Source: Ambulatory Visit | Attending: Orthopedic Surgery | Admitting: Orthopedic Surgery

## 2017-02-26 DIAGNOSIS — M79601 Pain in right arm: Secondary | ICD-10-CM

## 2017-02-26 DIAGNOSIS — M79602 Pain in left arm: Principal | ICD-10-CM

## 2017-02-27 ENCOUNTER — Ambulatory Visit (INDEPENDENT_AMBULATORY_CARE_PROVIDER_SITE_OTHER): Payer: Self-pay | Admitting: Family Medicine

## 2017-02-27 ENCOUNTER — Encounter: Payer: Self-pay | Admitting: Family Medicine

## 2017-02-27 VITALS — BP 138/90 | HR 60 | Resp 12 | Ht 60.0 in | Wt 138.2 lb

## 2017-02-27 DIAGNOSIS — M7710 Lateral epicondylitis, unspecified elbow: Secondary | ICD-10-CM

## 2017-02-27 DIAGNOSIS — M79602 Pain in left arm: Secondary | ICD-10-CM

## 2017-02-27 DIAGNOSIS — I10 Essential (primary) hypertension: Secondary | ICD-10-CM

## 2017-02-27 DIAGNOSIS — M79601 Pain in right arm: Secondary | ICD-10-CM

## 2017-02-27 MED ORDER — CELECOXIB 100 MG PO CAPS: 100.0000 mg | ORAL_CAPSULE | Freq: Two times a day (BID) | ORAL | 0 refills | Status: DC

## 2017-02-27 NOTE — Progress Notes (Signed)
Pre visit review using our clinic review tool, if applicable. No additional management support is needed unless otherwise documented below in the visit note. 

## 2017-02-27 NOTE — Progress Notes (Signed)
HPI:   ACUTE VISIT:  Chief Complaint  Patient presents with  . ongoing arm pain    Ms.Ashawnti R Burnsworth is a 51 y.o. female, who is here today complaining of severe pain on upper extremities since 11/2016. She is requesting something for pain.  Pain is throbbing,stubbing, and shooting. Between 1 and 10 she rates pain as 11/10,constant, from right above elbows to finger tips, exacerbated by "anything",holding,squeezing,and picking movements. No alleviated factors identified.  She denies Hx of trauma. No local edema or erythema. No other joint pain, no cervical pain, numbness,or weakness. Tingling "once in a while" of hands. Right handed.   She has taken Ibuprofen (3 tabs) once in a while because she does not like to take pills but also it does not help. Pain is stable.  She is following with ortho, Dr Marlou Sa, last OV 01/22/17 and next 03/03/17. She just had left elbow MRI done.  EMG:  1.  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   2.  A mild left median nerve entrapment at the wrist affecting sensory components.   There is no significant electrodiagnostic evidence of any other focal nerve entrapment, brachial plexopathy or cervical radiculopathy.   Hypertension:  BP mildly elevated today. Currently on Diltiazem CD 120 mg daily, started about a month ago (per pt report) ARB and Amlodipine intolerance or side effects. BP at home: For the past 2 weeks elevated,max145/90, attributed to pain.   She is taking medications as instructed.  She has not noted unusual headache, visual changes, exertional chest pain, dyspnea,  focal weakness, or edema.   Lab Results  Component Value Date   CREATININE 1.0 09/07/2014   BUN 18 09/07/2014   NA 139 09/07/2014   K 3.6 09/07/2014   CL 105 09/07/2014   CO2 24 09/07/2014    Review of Systems  Constitutional: Positive for fatigue. Negative for appetite change and fever.    HENT: Negative for mouth sores, nosebleeds and trouble swallowing.   Eyes: Negative for redness and visual disturbance.  Respiratory: Negative for cough, shortness of breath and wheezing.   Cardiovascular: Negative for chest pain, palpitations and leg swelling.  Gastrointestinal: Negative for abdominal pain, nausea and vomiting.       Negative for changes in bowel habits.  Genitourinary: Negative for decreased urine volume and hematuria.  Musculoskeletal: Positive for arthralgias and myalgias. Negative for gait problem, joint swelling and neck pain.  Skin: Negative for pallor, rash and wound.  Neurological: Negative for syncope, weakness, numbness and headaches.  Psychiatric/Behavioral: Negative for confusion. The patient is nervous/anxious.       Current Outpatient Prescriptions on File Prior to Visit  Medication Sig Dispense Refill  . budesonide-formoterol (SYMBICORT) 80-4.5 MCG/ACT inhaler Inhale 2 puffs into the lungs 2 (two) times daily. 1 Inhaler 3  . diltiazem (CARDIZEM CD) 120 MG 24 hr capsule Take 1 capsule (120 mg total) by mouth daily. 30 capsule 2   No current facility-administered medications on file prior to visit.      Past Medical History:  Diagnosis Date  . 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   . Allergy   . Asthma   . GERD (gastroesophageal reflux disease)   . Hypertension   . Positive TB test   . TOBACCO USE 03/22/2009   Qualifier: Diagnosis of  By: Regis Bill MD, [name]  Stopped Dec 12 2010     Allergies  Allergen Reactions  . Diovan Hct [Valsartan-Hydrochlorothiazide] Swelling    Had facial swelling and throat itching after 2 doses of the generic had done well on the brand medicine for a while.  See ED visit February 2014  . Purified Water [Water, Sterile] Anaphylaxis  . Allegra [Fexofenadine] Hives  . Chocolate     triggers Asthma  . Cimetidine     REACTION: unspecified  . Citric Acid     REACTION: swelling, hives, itching  .  Coly-Mycin S     REACTION: unspecified  . Peanut-Containing Drug Products Swelling  . Amlodipine Other (See Comments)    At 5 mg dose     Social History   Social History  . Marital status: Divorced    Spouse name: N/A  . Number of children: N/A  . Years of education: N/A   Social History Main Topics  . Smoking status: Former Smoker    Packs/day: 0.50    Years: 17.00    Types: Cigarettes    Quit date: 12/12/2010  . Smokeless tobacco: Never Used  . Alcohol use No  . Drug use: No  . Sexual activity: Not Asked   Other Topics Concern  . None   Social History Narrative   Divorced    Tobacco since age 59 stopped feb 2012   Works  [employer], broadcasting/film/video.Patient account REP    Vitals:   02/27/17 1037  BP: 138/90  Pulse: 60  Resp: 12   Body mass index is 27 kg/m.   Physical Exam  Nursing note and vitals reviewed. Constitutional: She is oriented to person, place, and time. She appears well-developed. She does not appear ill. No distress.  HENT:  Head: Atraumatic.  Mouth/Throat: Oropharynx is clear and moist and mucous membranes are normal.  Eyes: Conjunctivae are normal.  Cardiovascular: Normal rate and regular rhythm.   Murmur (SEM I/VI RUSB) heard. Pulses:      Radial pulses are 2+ on the right side, and 2+ on the left side.  Respiratory: Effort normal and breath sounds normal. No respiratory distress.  Musculoskeletal: She exhibits no edema.  Shoulders mild limitation active abduction due to pain elicited on forearms and elbows, normal passive ROM. Very tender upon light touch of epicondyles, medial and lateral, right above elbow (distal arm), forearms,wrists,MCP bilateral. Elbows normal ROM, pain elicited with passive supination and pronation. Right wrist mild limitation flexion. No deformity appreciated. No signs of synovitis.  Lymphadenopathy:    She has no cervical adenopathy.       Right: No supraclavicular adenopathy present.       Left: No  supraclavicular adenopathy present.  Neurological: She is alert and oriented to person, place, and time. Gait normal.  I do not appreciate focal deficit.  Skin: Skin is warm. No rash noted. No erythema.  Psychiatric: Her mood appears anxious. Her affect is labile.  Fairly groomed, good eye contact.    ASSESSMENT AND PLAN:   Galit was seen today for ongoing arm pain.  Diagnoses and all orders for this visit:  Pain in both upper extremities  She is not interested in Toradol IM here in the office. Clearly discussed side effects of NSAID's,including CVD.She voices understanding. Celebrex may be better tolerated given her Hx of GERD. Can alternate with Tylenol. No Hx of CKD,has not had BMP recently.  -     celecoxib (CELEBREX) 100 MG capsule; Take 1 capsule (100 mg total) by mouth 2 (two) times daily.  Lateral epicondylitis, unspecified laterality  Some of her symptoms and physical exam findings suggest epicondylitis: medial and lateral. We dicussed Dx and treatment options. Continue following with orthopedist. OTC topical medications as icy hot with lidocaine might help.  Essential hypertension  Re-checked 140/92. Possible complications of elevated BP discussed. Allergies or intolerance to some antihypertensive meds. Monitor BP at home. Low salt diet. Instructed about warning signs. F/U with PCP in 1-2 weeks.   Return in about 2 weeks (around 03/13/2017) for HTN, UE pain.     -Ms.Darcy R Shewmake was advised to return or notify a doctor immediately if symptoms worsen or new concerns arise.       Francesa Eugenio G. Martinique, MD  [facility]. [place] office.

## 2017-02-27 NOTE — Patient Instructions (Signed)
  [name] I have seen you today for an acute visit.  A few things to remember from today's visit:   Pain in both upper extremities - Plan: celecoxib (CELEBREX) 100 MG capsule  Lateral epicondylitis, unspecified laterality  Essential hypertension    Continue monitoring blood pressure at home, this medication can aggravate blood pressure among other side effects in the long-term. Topical over-the-counter icy hot with lidocaine might also help with pain. You can combine Tylenol 650 mg 3 times per day. Keep appointment with orthopedist and follow with your doctor in 1-2 weeks.     Medications prescribed today are intended for short period of time and will not be refill upon request, a follow up appointment might be necessary to discuss continuation of of treatment if appropriate.     In general please monitor for signs of worsening symptoms and seek immediate medical attention if any concerning.    Please be sure you have an appointment already scheduled with your PCP before you leave today.

## 2017-03-03 ENCOUNTER — Encounter (INDEPENDENT_AMBULATORY_CARE_PROVIDER_SITE_OTHER): Payer: Self-pay | Admitting: Orthopedic Surgery

## 2017-03-03 ENCOUNTER — Ambulatory Visit (INDEPENDENT_AMBULATORY_CARE_PROVIDER_SITE_OTHER): Payer: Self-pay | Admitting: Orthopedic Surgery

## 2017-03-03 DIAGNOSIS — M25522 Pain in left elbow: Secondary | ICD-10-CM

## 2017-03-03 NOTE — Progress Notes (Signed)
Office Visit Note   Patient: [name]           Date of Birth: 1966-10-19           MRN: [id number] Visit Date: 03/03/2017 Requested by: Burnis Medin, MD [place], [place] [id number] PCP: Lottie Dawson, MD  Subjective: Chief Complaint  Patient presents with  . Left Elbow - Follow-up    HPI: Imoni is a 51 year old patient with bilateral elbow pain which is severe.  She works at the [employer].  She's had an MRI scan of that elbow on the left which is reviewed which shows only mild interstitial common extensor tearing.  There is no abnormality of the common flexor origin.  She's having equal symptoms on both the common extensor and common flexor origin.  In regards to the forearm she's having glasslike pain in both forearms radiating down into the hand.  She is not really having much in the way of numbness and tingling in either hand and she denies any neck pain.  Her medication that she is taking is ibuprofen.  Naproxen was not helpful.  She has excruciating pain at work.  She hasn't worked since Tuesday.  She is "in tears" at times.  She also describes a mass on the superior aspect of her left shoulder.  She had a "cyst" excised in 1989 and a longitudinal incision is below this mass in question.              ROS: All systems reviewed are negative as they relate to the chief complaint within the history of present illness.  Patient denies  fevers or chills.   Assessment & Plan: Visit Diagnoses:  1. Pain in left elbow     Plan: Impression is bilateral forearm pain with pretty unimpressive mild interstitial tendinosis in the common extensor origin on the left elbow.  Common flexor origin is normal.  Her symptoms are symmetric and severe bilaterally.  I don't think this can really matches up with her symptoms.  EMG nerve study is also reviewed and that showed mild carpal tunnel syndrome on the left mild to moderate carpal tunnel syndrome on the right.  She's not really  having any numbness and tingling in the fingers.  Again I don't think that's really issue here either.  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  May be a recurrent lipoma just proximal to the prior incision.  Plan is observation of that lipoma to see if it increases in size.  Currently it measures about 2 and half by 2 and half centimeters.  That's something we can follow up in 6 months.  In regards to the forearm and elbows I think we are try topical anti-inflammatory samples provided along with physical therapy.  This is a tough situation for her.  Could consider referral to neurologist for another opinion.  Follow-Up Instructions: Return if symptoms worsen or fail to improve.   Orders:  No orders of the defined types were placed in this encounter.  No orders of the defined types were placed in this encounter.     Procedures: No procedures performed   Clinical Data: No additional findings.  Objective: Vital Signs: There were no vitals taken for this visit.  Physical Exam:   Constitutional: Patient appears well-developed HEENT:  Head: Normocephalic Eyes:EOM are normal Neck: Normal range of motion Cardiovascular: Normal rate Pulmonary/chest: Effort normal Neurologic: Patient is alert Skin: Skin is warm Psychiatric:  Patient has normal mood and affect    Ortho Exam: Orthopedic exam demonstrates pretty good grip strength some mild tenderness over the lateral condyle bilaterally.  Most of her pain is in a palm-sized area around the lateral condyle.  She also has some medial condyle pain on the left-hand side.  Negative Tinel's in the cubital tunnel bilaterally.  Elbow range of motion otherwise normal.  Neck range of motion normal.  Respiratory exam is unchanged.  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  She has a well-healed longitudinal incision just distal to that very mobile  lipoma.  Specialty Comments:  No specialty comments available.  Imaging: No results found.   PMFS History: Patient Active Problem List   Diagnosis Date Noted  . Conjunctivitis, acute 07/12/2013  . Headache 07/02/2013  . Other malaise and fatigue 06/02/2013  . Drug side effects 01/25/2013  . Redness of eye, left 11/24/2012  . Sleep deprivation 05/09/2012  . ASTHMA 09/29/2009  . Essential hypertension 05/30/2009  . GERD 05/12/2008  . HOT FLASHES 05/12/2008  . 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 ALLERGIC RHINITIS 08/07/2007   Past Medical History:  Diagnosis Date  . 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   . Allergy   . Asthma   . GERD (gastroesophageal reflux disease)   . Hypertension   . Positive TB test   . TOBACCO USE 03/22/2009   Qualifier: Diagnosis of  By: Regis Bill MD, [name]  Stopped Dec 12 2010      Family History  Problem Relation Age of Onset  . Hypertension Mother   . Alzheimer's disease Mother   . Hypertension Father   . Prostate cancer Father   . HIV Brother   . Lupus Sister     Past Surgical History:  Procedure Laterality Date  . ABDOMINAL HYSTERECTOMY    . BREAST SURGERY     cyst from left breast  . BUNIONECTOMY     both feet  . CHOLECYSTECTOMY    . CYSTECTOMY     left shoulder  . KNEE SURGERY Left   . PANENDOSCOPY    . TUBAL LIGATION    . uterine ablastion     and polyps removed   Social History   Occupational History  . Not on file.   Social History Main Topics  . Smoking status: Former Smoker    Packs/day: 0.50    Years: 17.00    Types: Cigarettes    Quit date: 12/12/2010  . Smokeless tobacco: Never Used  . Alcohol use No  . Drug use: No  . Sexual activity: Not on file

## 2017-03-20 ENCOUNTER — Ambulatory Visit: Payer: Self-pay | Admitting: Internal Medicine

## 2017-03-26 MED FILL — CARTIA XT 120 MG CAPSULE SA: 120 | 30 days supply | Qty: 30 | Fill #2

## 2017-06-11 ENCOUNTER — Ambulatory Visit (INDEPENDENT_AMBULATORY_CARE_PROVIDER_SITE_OTHER): Payer: Self-pay | Admitting: Family Medicine

## 2017-06-11 ENCOUNTER — Encounter: Payer: Self-pay | Admitting: Family Medicine

## 2017-06-11 VITALS — BP 140/98 | HR 60 | Temp 98.2°F | Wt 137.6 lb

## 2017-06-11 DIAGNOSIS — R196 Halitosis: Secondary | ICD-10-CM

## 2017-06-11 DIAGNOSIS — R195 Other fecal abnormalities: Secondary | ICD-10-CM

## 2017-06-11 DIAGNOSIS — I10 Essential (primary) hypertension: Secondary | ICD-10-CM

## 2017-06-11 MED ORDER — DILTIAZEM HCL ER COATED BEADS 180 MG PO CP24: 180.0000 mg | ORAL_CAPSULE | Freq: Every day | ORAL | 6 refills | Status: DC

## 2017-06-11 NOTE — Progress Notes (Signed)
Subjective:     Patient ID: [name], female   DOB: Apr 01, 1966, 51 y.o.   MRN: [id number]  HPI Patient seen with chief complaint of foul-smelling stool and bad breath for several months. She states back in June she made some dietary changes with increasing her water intake, reducing caffeine and eating more fruits and vegetables. She has been brushing her teeth regularly and flossing after each meal. She states her bowel movements are normal consistency but she is convinced they have a stronger odor than usual. Denies any increased gas. No abdominal pain. No loose stools. No buoyant stools. No color changes to stools. No chronic sinusitis symptoms. No allergic postnasal drip symptoms.  She has hypertension on diltiazem 120 mg. She states her blood pressure has been poorly controlled even with that medication: She has been out of medication past few days.  No headaches.  Past Medical History:  Diagnosis Date  . 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   . Allergy   . Asthma   . GERD (gastroesophageal reflux disease)   . Hypertension   . Positive TB test   . TOBACCO USE 03/22/2009   Qualifier: Diagnosis of  By: Regis Bill MD, [name]  Stopped Dec 12 2010     Past Surgical History:  Procedure Laterality Date  . ABDOMINAL HYSTERECTOMY    . BREAST SURGERY     cyst from left breast  . BUNIONECTOMY     both feet  . CHOLECYSTECTOMY    . CYSTECTOMY     left shoulder  . KNEE SURGERY Left   . PANENDOSCOPY    . TUBAL LIGATION    . uterine ablastion     and polyps removed    reports that she quit smoking about 6 years ago. Her smoking use included Cigarettes. She has a 8.50 pack-year smoking history. She has never used smokeless tobacco. She reports that she does not drink alcohol or use drugs. family history includes Alzheimer's disease in her mother; HIV in her brother; Hypertension in her father and mother; Lupus in her sister; Prostate cancer in her father. Allergies   Allergen Reactions  . Diovan Hct [Valsartan-Hydrochlorothiazide] Swelling    Had facial swelling and throat itching after 2 doses of the generic had done well on the brand medicine for a while.  See ED visit February 2014  . Purified Water [Water, Sterile] Anaphylaxis  . Allegra [Fexofenadine] Hives  . Chocolate     triggers Asthma  . Cimetidine     REACTION: unspecified  . Citric Acid     REACTION: swelling, hives, itching  . Coly-Mycin S     REACTION: unspecified  . Peanut-Containing Drug Products Swelling  . Amlodipine Other (See Comments)    At 5 mg dose      Review of Systems  Constitutional: Negative for chills and fever.  HENT: Negative for postnasal drip and sinus pain.   Respiratory: Negative for shortness of breath.   Cardiovascular: Negative for chest pain.  Gastrointestinal: Negative for abdominal pain, blood in stool, constipation, diarrhea, nausea and vomiting.  Endocrine: Negative for polydipsia and polyuria.  Genitourinary: Negative for dysuria.       Objective:   Physical Exam  Constitutional: She appears well-developed and well-nourished.  HENT:  Nose: Nose normal.  Mouth/Throat: Oropharynx is clear and moist.  Neck: Neck supple.  Cardiovascular: Normal rate and regular rhythm.   Pulmonary/Chest: Effort normal and breath sounds normal. No respiratory distress. She has no  wheezes. She has no rales.  Abdominal: Soft. She exhibits no mass. There is no tenderness. There is no rebound and no guarding.  Lymphadenopathy:    She has no cervical adenopathy.       Assessment:     #1 subjective complain of bad breath and foul-smelling stools. She does not describe any loose stools or buoyant stools to suggest fat malabsorption.  #2 hypertension poorly controlled    Plan:     -Get back on blood pressure medication. She requested that we consider titrating up dose and wrote dosage for Cardizem CD 180 mg once daily and recommend follow-up promptly with  primary in a few weeks to reassess -Consider complete physical -We discussed things like screening colonoscopy since she just turned 51. Her concern is that she has currently no insurance coverage.  Eulas Post MD [facility]

## 2017-06-11 NOTE — Patient Instructions (Signed)
Set up complete physical with Dr Regis Bill.

## 2017-06-29 ENCOUNTER — Encounter (HOSPITAL_COMMUNITY): Payer: Self-pay | Admitting: Emergency Medicine

## 2017-06-29 ENCOUNTER — Emergency Department (HOSPITAL_COMMUNITY)
Admission: EM | Admit: 2017-06-29 | Discharge: 2017-06-29 | Disposition: A | Discharge status: Home / Self Care | Payer: No Typology Code available for payment source | Attending: Emergency Medicine | Admitting: Emergency Medicine

## 2017-06-29 DIAGNOSIS — Z87891 Personal history of nicotine dependence: Secondary | ICD-10-CM | POA: Insufficient documentation

## 2017-06-29 DIAGNOSIS — I10 Essential (primary) hypertension: Secondary | ICD-10-CM | POA: Insufficient documentation

## 2017-06-29 DIAGNOSIS — Z91010 Allergy to peanuts: Secondary | ICD-10-CM | POA: Insufficient documentation

## 2017-06-29 DIAGNOSIS — J45909 Unspecified asthma, uncomplicated: Secondary | ICD-10-CM | POA: Insufficient documentation

## 2017-06-29 DIAGNOSIS — M545 Low back pain, unspecified: Secondary | ICD-10-CM

## 2017-06-29 DIAGNOSIS — Z79899 Other long term (current) drug therapy: Secondary | ICD-10-CM | POA: Insufficient documentation

## 2017-06-29 LAB — URINALYSIS, ROUTINE W REFLEX MICROSCOPIC
Bilirubin Urine: NEGATIVE
Glucose, UA: NEGATIVE mg/dL
Hgb urine dipstick: NEGATIVE
Ketones, ur: NEGATIVE mg/dL
Leukocytes, UA: NEGATIVE
Nitrite: NEGATIVE
Protein, ur: NEGATIVE mg/dL
Specific Gravity, Urine: <1.005 — ABNORMAL LOW (ref 1.005–1.030)
pH: 7.5 (ref 5.0–8.0)

## 2017-06-29 MED ORDER — OXYCODONE-ACETAMINOPHEN 5-325 MG PO TABS: 2.0000 | ORAL_TABLET | ORAL | 0 refills | Status: DC | PRN

## 2017-06-29 MED ORDER — DIAZEPAM 5 MG PO TABS
5.0000 mg | ORAL_TABLET | Freq: Once | ORAL | Status: AC
  Administered 2017-06-29: 5 mg via ORAL
  Filled 2017-06-29: qty 1

## 2017-06-29 MED ORDER — HYDROMORPHONE HCL 1 MG/ML IJ SOLN
1.0000 mg | Freq: Once | INTRAMUSCULAR | Status: AC
  Administered 2017-06-29: 1 mg via INTRAMUSCULAR
  Filled 2017-06-29: qty 1

## 2017-06-29 MED ORDER — PREDNISONE 10 MG (21) PO TBPK: ORAL_TABLET | Freq: Every day | ORAL | 0 refills | Status: DC

## 2017-06-29 MED ORDER — METHOCARBAMOL 750 MG PO TABS: 750.0000 mg | ORAL_TABLET | Freq: Four times a day (QID) | ORAL | 0 refills | Status: DC

## 2017-06-29 MED ORDER — IBUPROFEN 600 MG PO TABS: 600.0000 mg | ORAL_TABLET | Freq: Four times a day (QID) | ORAL | 0 refills | Status: DC | PRN

## 2017-06-29 NOTE — ED Notes (Signed)
Bed: WA09 Expected date:  Expected time:  Means of arrival:  Comments: Hold for triage 

## 2017-06-29 NOTE — ED Provider Notes (Signed)
[facility] DEPT Provider Note   CSN: [id number] Arrival date & time: 06/29/17  0714     History   Chief Complaint Chief Complaint  Patient presents with  . Back Pain  . Flank Pain    R    HPI [name] is a 51 y.o. female.  51 year old female presents with several days of right-sided lower back pain radiates to her right anterior thigh with associated paresthesias. No saddle anesthesias. No bowel or bladder dysfunction. Denies any foot drop when she walks. No history of trauma but does have a prior history of back pain. Denies any prior surgeries. Pain is characterized as sharp and worse with any movement better with remaining still. Has been using over-the-counter medication without relief.      Past Medical History:  Diagnosis Date  . 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   . Allergy   . Asthma   . GERD (gastroesophageal reflux disease)   . Hypertension   . Positive TB test   . TOBACCO USE 03/22/2009   Qualifier: Diagnosis of  By: Regis Bill MD, [name]  Stopped Dec 12 2010      Patient Active Problem List   Diagnosis Date Noted  . Conjunctivitis, acute 07/12/2013  . Headache 07/02/2013  . Other malaise and fatigue 06/02/2013  . Drug side effects 01/25/2013  . Redness of eye, left 11/24/2012  . Sleep deprivation 05/09/2012  . ASTHMA 09/29/2009  . Essential hypertension 05/30/2009  . GERD 05/12/2008  . HOT FLASHES 05/12/2008  . 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 ALLERGIC RHINITIS 08/07/2007    Past Surgical History:  Procedure Laterality Date  . ABDOMINAL HYSTERECTOMY    . BREAST SURGERY     cyst from left breast  . BUNIONECTOMY     both feet  . CHOLECYSTECTOMY    . CYSTECTOMY     left shoulder  . KNEE SURGERY Left   . PANENDOSCOPY    . TUBAL LIGATION    . uterine ablastion     and polyps removed    OB History    No data available       Home Medications    Prior to Admission medications   Medication Sig Start Date End  Date Taking? Authorizing Provider  budesonide-formoterol (SYMBICORT) 80-4.5 MCG/ACT inhaler Inhale 2 puffs into the lungs 2 (two) times daily. 07/24/16  Yes [name], [name], MD  diltiazem (CARDIZEM CD) 180 MG 24 hr capsule Take 1 capsule (180 mg total) by mouth daily. 06/11/17  Yes Burchette, Alinda Sierras, MD  ibuprofen (ADVIL,MOTRIN) 200 MG tablet Take 200-400 mg by mouth every 6 (six) hours as needed for mild pain.   Yes [provider]    Family History Family History  Problem Relation Age of Onset  . Hypertension Mother   . Alzheimer's disease Mother   . Hypertension Father   . Prostate cancer Father   . HIV Brother   . Lupus Sister     Social History Social History  Substance Use Topics  . Smoking status: Former Smoker    Packs/day: 0.50    Years: 17.00    Types: Cigarettes    Quit date: 12/12/2010  . Smokeless tobacco: Never Used  . Alcohol use No     Allergies   Diovan hct [valsartan-hydrochlorothiazide]; Purified water [water, sterile]; Allegra [fexofenadine]; Chocolate; Cimetidine; Citric acid; Coly-mycin s; Peanut-containing drug products; and Amlodipine   Review of Systems Review of Systems  All other systems reviewed and  are negative.    Physical Exam Updated Vital Signs BP (!) 180/119 (BP Location: Left Arm)   Pulse 80   Temp 97.8 F (36.6 C) (Oral)   Resp 20   SpO2 99%   Physical Exam  Constitutional: She is oriented to person, place, and time. She appears well-developed and well-nourished.  Non-toxic appearance. No distress.  HENT:  Head: Normocephalic and atraumatic.  Eyes: Pupils are equal, round, and reactive to light. Conjunctivae, EOM and lids are normal.  Neck: Normal range of motion. Neck supple. No tracheal deviation present. No thyroid mass present.  Cardiovascular: Normal rate, regular rhythm and normal heart sounds.  Exam reveals no gallop.   No murmur heard. Pulmonary/Chest: Effort normal and breath sounds normal. No stridor. No  respiratory distress. She has no decreased breath sounds. She has no wheezes. She has no rhonchi. She has no rales.  Abdominal: Soft. Normal appearance and bowel sounds are normal. She exhibits no distension. There is no tenderness. There is no rebound and no CVA tenderness.  Musculoskeletal: Normal range of motion. She exhibits no edema or tenderness.       Back:  Neurological: She is alert and oriented to person, place, and time. She has normal strength. No cranial nerve deficit or sensory deficit. GCS eye subscore is 4. GCS verbal subscore is 5. GCS motor subscore is 6.  Reflex Scores:      Patellar reflexes are 2+ on the right side and 2+ on the left side. Skin: Skin is warm and dry. No abrasion and no rash noted.  Psychiatric: She has a normal mood and affect. Her speech is normal and behavior is normal.  Nursing note and vitals reviewed.    ED Treatments / Results  Labs (all labs ordered are listed, but only abnormal results are displayed) Labs Reviewed  URINALYSIS, ROUTINE W REFLEX MICROSCOPIC - Abnormal; Notable for the following:       Result Value   Specific Gravity, Urine <1.005 (*)    All other components within normal limits    EKG  EKG Interpretation None       Radiology No results found.  Procedures Procedures (including critical care time)  Medications Ordered in ED Medications  diazepam (VALIUM) tablet 5 mg (not administered)  HYDROmorphone (DILAUDID) injection 1 mg (not administered)     Initial Impression / Assessment and Plan / ED Course  I have reviewed the triage vital signs and the nursing notes.  Pertinent labs & imaging results that were available during my care of the patient were reviewed by me and considered in my medical decision making (see chart for details).    Patient medicated for pain and feels better here. No evidence of neurological involvement suspect this is from patient's prior history of back pain. Will prescribe analgesics as  well as prednisone and she will follow-up with her Dr.  Final Clinical Impressions(s) / ED Diagnoses   Final diagnoses:  None    New Prescriptions New Prescriptions   No medications on file     Lacretia Leigh, MD 06/29/17 1005

## 2017-06-29 NOTE — ED Triage Notes (Signed)
Pt c/o lower R back pain x 6 days, but gradually worsening. Pt states pain became 10/10 today with numbness on R side of abdomen. Denies urinary or bowel complications. Pt denies fever. Pt denies injury. Pt also reports she has not taken any meds for pain.

## 2017-06-29 NOTE — ED Notes (Signed)
Patient was alert, oriented and stable upon discharge. RN went over AVS and patient had no further questions.  Patient was instructed not to drive or operate heavy machinery on narcotic pain medication. Patient was advised to avoid driving while taking prescribed muscle relaxers.

## 2017-06-30 ENCOUNTER — Telehealth: Payer: Self-pay | Admitting: Internal Medicine

## 2017-06-30 MED FILL — METHOCARBAMOL 750 MG TABLET: 750 | 7 days supply | Qty: 30 | Fill #0

## 2017-06-30 MED FILL — CARTIA XT 180 MG CAPSULE SA: 180 | 30 days supply | Qty: 30 | Fill #0 | Status: TO

## 2017-06-30 MED FILL — OXYCODONE-ACETAMINOPHEN 5-3: 5-325 | 2 days supply | Qty: 15 | Fill #0

## 2017-06-30 MED FILL — predniSONE 10 MG TABS: 10 | 12 days supply | Qty: 42 | Fill #0

## 2017-06-30 NOTE — Telephone Encounter (Signed)
Pt was seen in the ER during the weekend is it okay for her to come in on Tuesday by using 2 same day appointments?

## 2017-06-30 NOTE — Telephone Encounter (Signed)
Can come on Wednesday  And put 2 slots together

## 2017-06-30 NOTE — Telephone Encounter (Signed)
See below, thank you

## 2017-06-30 NOTE — Telephone Encounter (Signed)
Pt has been scheduled on Wednesday

## 2017-06-30 NOTE — Telephone Encounter (Signed)
Okay to use 2 same day appointment on Tuesday for patient?

## 2017-07-02 ENCOUNTER — Ambulatory Visit: Payer: No Typology Code available for payment source | Admitting: Internal Medicine

## 2017-07-07 ENCOUNTER — Encounter: Payer: Self-pay | Admitting: Internal Medicine

## 2017-07-08 NOTE — Telephone Encounter (Signed)
Inform patient that  I  dont have an answer for this.  Ask your family what they think also  . Otherwise can  make  appt ( Extended  Slot  2 blocks )  And see if we can figure this out    [facility]

## 2017-07-17 ENCOUNTER — Encounter: Payer: Self-pay | Admitting: Internal Medicine

## 2017-07-17 ENCOUNTER — Ambulatory Visit (INDEPENDENT_AMBULATORY_CARE_PROVIDER_SITE_OTHER): Payer: Self-pay | Admitting: Internal Medicine

## 2017-07-17 ENCOUNTER — Encounter: Payer: Self-pay | Admitting: Emergency Medicine

## 2017-07-17 VITALS — BP 124/96 | HR 63 | Temp 97.8°F | Wt 140.0 lb

## 2017-07-17 DIAGNOSIS — M545 Low back pain, unspecified: Secondary | ICD-10-CM

## 2017-07-17 DIAGNOSIS — R109 Unspecified abdominal pain: Secondary | ICD-10-CM

## 2017-07-17 DIAGNOSIS — B029 Zoster without complications: Secondary | ICD-10-CM

## 2017-07-17 DIAGNOSIS — R200 Anesthesia of skin: Secondary | ICD-10-CM

## 2017-07-17 MED ORDER — VALACYCLOVIR HCL 1 G PO TABS: 1000.0000 mg | ORAL_TABLET | Freq: Three times a day (TID) | ORAL | 0 refills | Status: DC

## 2017-07-17 MED FILL — valACYclovir HCL 1 GM TABS: 1 | 7 days supply | Qty: 21 | Fill #0
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
## 2017-07-17 NOTE — Patient Instructions (Signed)
This acts like nerve pain and because of the rash could be atypical shingles. Antiviral medicine send in to codeine pharmacy.Continue ibuprofen 600-800 mg every 8 hours as needed for pain Can use the narcotic at night if needed. Either way expect improvement in the next 2 weeks. If you get fever or weakness in your legs we cannot walk or other concerns contact us. It is possible you have a pinched nerve in your back also causing this and if it's persisted we can do further workup. Plan follow-up visit in about a month if not all better or when you obtain your insurance. Contact us in the meantime if needed. At this point I do not think imaging will be helpful.

## 2017-07-19 ENCOUNTER — Encounter (HOSPITAL_COMMUNITY): Payer: Self-pay

## 2017-07-19 ENCOUNTER — Emergency Department (HOSPITAL_COMMUNITY)
Admission: EM | Admit: 2017-07-19 | Discharge: 2017-07-19 | Disposition: A | Discharge status: Home / Self Care | Payer: No Typology Code available for payment source | Attending: Emergency Medicine | Admitting: Emergency Medicine

## 2017-07-19 ENCOUNTER — Emergency Department (HOSPITAL_COMMUNITY): Payer: No Typology Code available for payment source

## 2017-07-19 DIAGNOSIS — Z79899 Other long term (current) drug therapy: Secondary | ICD-10-CM | POA: Insufficient documentation

## 2017-07-19 DIAGNOSIS — Z87891 Personal history of nicotine dependence: Secondary | ICD-10-CM | POA: Insufficient documentation

## 2017-07-19 DIAGNOSIS — J45909 Unspecified asthma, uncomplicated: Secondary | ICD-10-CM | POA: Insufficient documentation

## 2017-07-19 DIAGNOSIS — K219 Gastro-esophageal reflux disease without esophagitis: Secondary | ICD-10-CM

## 2017-07-19 DIAGNOSIS — I10 Essential (primary) hypertension: Secondary | ICD-10-CM | POA: Insufficient documentation

## 2017-07-19 DIAGNOSIS — Z91010 Allergy to peanuts: Secondary | ICD-10-CM | POA: Insufficient documentation

## 2017-07-19 HISTORY — DX: Zoster without complications: B02.9

## 2017-07-19 LAB — CBC WITH DIFFERENTIAL/PLATELET
Basophils Absolute: 0 10*3/uL (ref 0.0–0.1)
Basophils Relative: 0 %
Eosinophils Absolute: 0.3 10*3/uL (ref 0.0–0.7)
Eosinophils Relative: 7 %
HCT: 39.7 % (ref 36.0–46.0)
Hemoglobin: 13 g/dL (ref 12.0–15.0)
Lymphocytes Relative: 45 %
Lymphs Abs: 2.2 10*3/uL (ref 0.7–4.0)
MCH: 27.5 pg (ref 26.0–34.0)
MCHC: 32.7 g/dL (ref 30.0–36.0)
MCV: 83.9 fL (ref 78.0–100.0)
Monocytes Absolute: 0.4 10*3/uL (ref 0.1–1.0)
Monocytes Relative: 7 %
Neutro Abs: 2 10*3/uL (ref 1.7–7.7)
Neutrophils Relative %: 41 %
Platelets: 286 10*3/uL (ref 150–400)
RBC: 4.73 MIL/uL (ref 3.87–5.11)
RDW: 15.2 % (ref 11.5–15.5)
WBC: 4.9 10*3/uL (ref 4.0–10.5)

## 2017-07-19 LAB — COMPREHENSIVE METABOLIC PANEL
ALT: 13 U/L — ABNORMAL LOW (ref 14–54)
AST: 20 U/L (ref 15–41)
Albumin: 4.4 g/dL (ref 3.5–5.0)
Alkaline Phosphatase: 53 U/L (ref 38–126)
Anion gap: 7 (ref 5–15)
BUN: 10 mg/dL (ref 6–20)
CO2: 25 mmol/L (ref 22–32)
Calcium: 9.2 mg/dL (ref 8.9–10.3)
Chloride: 106 mmol/L (ref 101–111)
Creatinine, Ser: 0.84 mg/dL (ref 0.44–1.00)
GFR calc Af Amer: >60 mL/min (ref >60)
GFR calc non Af Amer: >60 mL/min (ref >60)
Glucose, Bld: 104 mg/dL — ABNORMAL HIGH (ref 65–99)
Potassium: 3.7 mmol/L (ref 3.5–5.1)
Sodium: 138 mmol/L (ref 135–145)
Total Bilirubin: 0.5 mg/dL (ref 0.3–1.2)
Total Protein: 7.5 g/dL (ref 6.5–8.1)

## 2017-07-19 LAB — URINALYSIS, ROUTINE W REFLEX MICROSCOPIC
Bilirubin Urine: NEGATIVE
Glucose, UA: NEGATIVE mg/dL
Hgb urine dipstick: NEGATIVE
Ketones, ur: NEGATIVE mg/dL
Leukocytes, UA: NEGATIVE
Nitrite: NEGATIVE
Protein, ur: NEGATIVE mg/dL
Specific Gravity, Urine: 1.006 (ref 1.005–1.030)
pH: 7 (ref 5.0–8.0)

## 2017-07-19 MED ORDER — GI COCKTAIL ~~LOC~~
30.0000 mL | Freq: Once | ORAL | Status: AC
  Administered 2017-07-19: 30 mL via ORAL
  Filled 2017-07-19: qty 30

## 2017-07-19 MED ORDER — PANTOPRAZOLE SODIUM 20 MG PO TBEC: 20.0000 mg | DELAYED_RELEASE_TABLET | Freq: Every day | ORAL | 0 refills | Status: DC

## 2017-07-19 NOTE — ED Provider Notes (Signed)
[facility] DEPT Provider Note   CSN: [id number] Arrival date & time: 07/19/17  1316     History   Chief Complaint Chief Complaint  Patient presents with  . Body odor    HPI [name] is a 51 y.o. female.  Pt presents to the ED today with bad breath.  The pt said she's noticed a smell that smells like feces from her breath since June.  The pt said that she's been to see her pcp and to her dentist.  They have not noticed any abnormalities.  The pt was recently diagnosed with shingles and has been on meds for 2 days.  Sx started months before that.  The pt said that she does not have any pain.  The pt has not taken any meds for her sx.      Past Medical History:  Diagnosis Date  . 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   . Allergy   . Asthma   . GERD (gastroesophageal reflux disease)   . Hypertension   . Positive TB test   . Shingles   . TOBACCO USE 03/22/2009   Qualifier: Diagnosis of  By: Regis Bill MD, [name]  Stopped Dec 12 2010      Patient Active Problem List   Diagnosis Date Noted  . Headache 07/02/2013  . Other malaise and fatigue 06/02/2013  . ASTHMA 09/29/2009  . Essential hypertension 05/30/2009  . GERD 05/12/2008  . HOT FLASHES 05/12/2008  . 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 ALLERGIC RHINITIS 08/07/2007    Past Surgical History:  Procedure Laterality Date  . ABDOMINAL HYSTERECTOMY    . BREAST SURGERY     cyst from left breast  . BUNIONECTOMY     both feet  . CHOLECYSTECTOMY    . CYSTECTOMY     left shoulder  . KNEE SURGERY Left   . PANENDOSCOPY    . TUBAL LIGATION    . uterine ablastion     and polyps removed    OB History    No data available       Home Medications    Prior to Admission medications   Medication Sig Start Date End Date Taking? Authorizing Provider  budesonide-formoterol (SYMBICORT) 80-4.5 MCG/ACT inhaler Inhale 2 puffs into the lungs 2 (two) times daily. 07/24/16   [name], [name], MD  diltiazem  (CARDIZEM CD) 180 MG 24 hr capsule Take 1 capsule (180 mg total) by mouth daily. 06/11/17   Burchette, Alinda Sierras, MD  ibuprofen (ADVIL,MOTRIN) 200 MG tablet Take 200-400 mg by mouth every 6 (six) hours as needed for mild pain.    [provider]  methocarbamol (ROBAXIN-750) 750 MG tablet Take 1 tablet (750 mg total) by mouth 4 (four) times daily. 06/29/17   Lacretia Leigh, MD  oxyCODONE-acetaminophen (PERCOCET/ROXICET) 5-325 MG tablet Take 2 tablets by mouth every 4 (four) hours as needed for severe pain. 06/29/17   Lacretia Leigh, MD  pantoprazole (PROTONIX) 20 MG tablet Take 1 tablet (20 mg total) by mouth daily. 07/19/17   Isla Pence, MD  valACYclovir (VALTREX) 1000 MG tablet Take 1 tablet (1,000 mg total) by mouth 3 (three) times daily. For possible shingles 07/17/17   [name], [name], MD    Family History Family History  Problem Relation Age of Onset  . Hypertension Mother   . Alzheimer's disease Mother   . Hypertension Father   . Prostate cancer Father   . HIV Brother   . Lupus Sister  Social History Social History  Substance Use Topics  . Smoking status: Former Smoker    Packs/day: 0.50    Years: 17.00    Types: Cigarettes    Quit date: 12/12/2010  . Smokeless tobacco: Never Used  . Alcohol use No     Allergies   Diovan hct [valsartan-hydrochlorothiazide]; Purified water [water, sterile]; Allegra [fexofenadine]; Chocolate; Cimetidine; Citric acid; Coly-mycin s; Peanut-containing drug products; and Amlodipine   Review of Systems Review of Systems  HENT:       Bad breath  All other systems reviewed and are negative.    Physical Exam Updated Vital Signs BP (!) 167/117 (BP Location: Right Arm)   Pulse 69   Temp 98.3 F (36.8 C) (Oral)   Resp 18   Ht 5\' 3"  (1.6 m)   Wt 63.5 kg (140 lb)   SpO2 100%   BMI 24.80 kg/m   Physical Exam  Constitutional: She is oriented to person, place, and time. She appears well-developed and well-nourished.  HENT:    Head: Normocephalic and atraumatic.  Right Ear: External ear normal.  Left Ear: External ear normal.  Nose: Nose normal.  Mouth/Throat: Oropharynx is clear and moist.  Eyes: Pupils are equal, round, and reactive to light. Conjunctivae and EOM are normal.  Neck: Normal range of motion. Neck supple.  Cardiovascular: Normal rate, regular rhythm, normal heart sounds and intact distal pulses.   Pulmonary/Chest: Effort normal and breath sounds normal.  Abdominal: Soft. Bowel sounds are normal.  Musculoskeletal: Normal range of motion.  Neurological: She is alert and oriented to person, place, and time.  Skin: Skin is warm.  Psychiatric: She has a normal mood and affect. Her behavior is normal. Judgment and thought content normal.  Nursing note and vitals reviewed.    ED Treatments / Results  Labs (all labs ordered are listed, but only abnormal results are displayed) Labs Reviewed  COMPREHENSIVE METABOLIC PANEL - Abnormal; Notable for the following:       Result Value   Glucose, Bld 104 (*)    ALT 13 (*)    All other components within normal limits  URINALYSIS, ROUTINE W REFLEX MICROSCOPIC - Abnormal; Notable for the following:    Color, Urine STRAW (*)    All other components within normal limits  CBC WITH DIFFERENTIAL/PLATELET    EKG  EKG Interpretation None       Radiology Dg Abdomen Acute W/chest  Result Date: 07/19/2017 CLINICAL DATA:  Acute chest and abdominal pain. EXAM: DG ABDOMEN ACUTE W/ 1V CHEST COMPARISON:  12/24/2016 and prior radiographs FINDINGS: Cardiomediastinal silhouette is unremarkable. The lungs are clear. No pleural effusion or pneumothorax. Bowel gas pattern is unremarkable. No dilated bowel loops or evidence of bowel obstruction. No suspicious calcifications identified. Cholecystectomy clips are again identified. IMPRESSION: Negative abdominal radiographs.  No acute cardiopulmonary disease. Electronically Signed   By: Margarette Canada M.D.   On: 07/19/2017  14:49    Procedures Procedures (including critical care time)  Medications Ordered in ED Medications  gi cocktail (Maalox,Lidocaine,Donnatal) (30 mLs Oral Given 07/19/17 1446)     Initial Impression / Assessment and Plan / ED Course  I have reviewed the triage vital signs and the nursing notes.  Pertinent labs & imaging results that were available during my care of the patient were reviewed by me and considered in my medical decision making (see chart for details).    I think odor is likely from reflux.  I will start her on protonix.  She  is given the number of GI to f/u if sx persist.  Return if worse.  Final Clinical Impressions(s) / ED Diagnoses   Final diagnoses:  Gastroesophageal reflux disease without esophagitis    New Prescriptions New Prescriptions   PANTOPRAZOLE (PROTONIX) 20 MG TABLET    Take 1 tablet (20 mg total) by mouth daily.     Isla Pence, MD 07/19/17 1536

## 2017-07-19 NOTE — ED Triage Notes (Signed)
Patient comes from home. States she has had a "disturbing foul body odor" since June. The odor comes and goes. Went to PCP in [place] 2 days ago and was dx with shingles, but they did not notice any smell. Also went to dentist but did not notice anything. Pt taking RX oxycodone for shingle pain 8/10.

## 2017-07-23 ENCOUNTER — Ambulatory Visit (INDEPENDENT_AMBULATORY_CARE_PROVIDER_SITE_OTHER): Payer: Self-pay | Admitting: Internal Medicine

## 2017-07-23 ENCOUNTER — Encounter: Payer: Self-pay | Admitting: Internal Medicine

## 2017-07-23 VITALS — BP 140/92 | HR 60 | Temp 98.1°F | Wt 140.0 lb

## 2017-07-23 DIAGNOSIS — B029 Zoster without complications: Secondary | ICD-10-CM

## 2017-07-23 DIAGNOSIS — R196 Halitosis: Secondary | ICD-10-CM

## 2017-07-23 NOTE — Patient Instructions (Addendum)
  Take the protonix once a day in case acid reflux is a silent is causing your symptoms.may take a few weeks to work  .  I will put an order in referral for ear nose and throat to get a good throat exam because you are feeling the problem is more on the right side of the back of her throat. I don't see any other abnormalities on your exam. The most common problems that  can cause the  Problem .  are dental, sinus, or  Reflux. Issues  Make sure you brush your tongue also    You should be contacted about the ENT appt.  For  October  You can  Change appt   As needed.

## 2017-07-23 NOTE — Progress Notes (Signed)
Chief Complaint  Patient presents with  . Follow-up    Seen in ED 07/19/17 - No change. C/o sour taste in her mouth, burning in throat. Taking Protonix 20mg , not helping.     HPI: [name] 51 y.o. come in for fu .  odor of breath got so bad she went to ED.   Feeling affecting her job at work. Has trid irrigation sinus dental check up ( good)  And given Protonix in ed    X 1  Got filled  but not taken yet .    ? If  Ed  Consider to take antibiotic  But none ordered .  Rash covered to gfeel better  No new rash .  No vomiting  ROS: See pertinent positives and negatives per HPI. No st dysphagia . Cough with this.  Has used sinus irrigation and no help. No sinus sx   Past Medical History:  Diagnosis Date  . 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   . Allergy   . Asthma   . GERD (gastroesophageal reflux disease)   . Hypertension   . Positive TB test   . Shingles   . TOBACCO USE 03/22/2009   Qualifier: Diagnosis of  By: Regis Bill MD, [name]  Stopped Dec 12 2010      Family History  Problem Relation Age of Onset  . Hypertension Mother   . Alzheimer's disease Mother   . Hypertension Father   . Prostate cancer Father   . HIV Brother   . Lupus Sister     Social History   Social History  . Marital status: Divorced    Spouse name: N/A  . Number of children: N/A  . Years of education: N/A   Social History Main Topics  . Smoking status: Former Smoker    Packs/day: 0.50    Years: 17.00    Types: Cigarettes    Quit date: 12/12/2010  . Smokeless tobacco: Never Used  . Alcohol use No  . Drug use: No  . Sexual activity: Not Asked   Other Topics Concern  . None   Social History Narrative   Divorced    Tobacco since age 89 stopped feb 2012   Works  [employer], broadcasting/film/video.Patient account REP   Now works [employer]     Outpatient Medications Prior to Visit  Medication Sig Dispense Refill  . budesonide-formoterol (SYMBICORT) 80-4.5  MCG/ACT inhaler Inhale 2 puffs into the lungs 2 (two) times daily. 1 Inhaler 3  . diltiazem (CARDIZEM CD) 180 MG 24 hr capsule Take 1 capsule (180 mg total) by mouth daily. 30 capsule 6  . ibuprofen (ADVIL,MOTRIN) 200 MG tablet Take 200-400 mg by mouth every 6 (six) hours as needed for mild pain.    . methocarbamol (ROBAXIN-750) 750 MG tablet Take 1 tablet (750 mg total) by mouth 4 (four) times daily. 30 tablet 0  . oxyCODONE-acetaminophen (PERCOCET/ROXICET) 5-325 MG tablet Take 2 tablets by mouth every 4 (four) hours as needed for severe pain. 15 tablet 0  . pantoprazole (PROTONIX) 20 MG tablet Take 1 tablet (20 mg total) by mouth daily. 30 tablet 0  . valACYclovir (VALTREX) 1000 MG tablet Take 1 tablet (1,000 mg total) by mouth 3 (three) times daily. For possible shingles 21 tablet 0   No facility-administered medications prior to visit.      EXAM:  BP (!) 140/92 (BP Location: Right Arm, Patient Position: Sitting, Cuff Size: Normal)  Pulse 60   Temp 98.1 F (36.7 C) (Oral)   Wt 140 lb (63.5 kg)   BMI 24.80 kg/m   Body mass index is 24.8 kg/m.  GENERAL: vitals reviewed and listed above, alert, oriented, appears well hydrated and in no acute distress  Emotional    Distressed over current problem  HEENT: atraumatic, conjunctiva  clear, no obvious abnormalities on inspection of external nose and ears   tmx clear  Op no tonsils  Deeper fossa on right but no lesion seen and no drainage  O NECK: no obvious masses on inspection palpation  Side rsh covered with bandages " ssays feels better when covered .  Still has dyssesthesis right lateral chesk .  CV: HRRR, no clubbing cyanosis or  peripheral edema nl cap refill  MS: moves all extremities without noticeable focal  abnormality PSYCH: pleasant and cooperative,  BP Readings from Last 3 Encounters:  07/23/17 (!) 140/92  07/19/17 (!) 170/93  07/17/17 (!) 124/96    ASSESSMENT AND PLAN:  Discussed the following assessment and  plan:  Halitosis - affecting her job reports  from the right side of  throat area ?acidy sx.refer ent, take protonix every day poss probiotic gargles  - Plan: Ambulatory referral to ENT  Herpes zoster without complication  presumed  - atypical but still within  reasonable cause of sx  expectant management tiome Curious that she feels it is coming from the right side of her throat so at this time plan good ENT evaluation consider GI evaluation if no cause or intervention helping. At this time take the acid blocker every day because it may take a week or 2 for 8 to help if it is helping. She should get her [employer] as of the beginning of October. Could try adding probiotic or yogurt with active culture. Lactobacillus etc. -Patient advised to return or notify health care team  if  new concerns arise.  Patient Instructions   Take the protonix once a day in case acid reflux is a silent is causing your symptoms.may take a few weeks to work  .  I will put an order in referral for ear nose and throat to get a good throat exam because you are feeling the problem is more on the right side of the back of her throat. I don't see any other abnormalities on your exam. The most common problems that  can cause the  Problem .  are dental, sinus, or  Reflux. Issues  Make sure you brush your tongue also    You should be contacted about the ENT appt.  For  October  You can  Change appt   As needed.         [name]. [name] M.D.

## 2017-07-25 ENCOUNTER — Encounter: Payer: Self-pay | Admitting: Internal Medicine

## 2017-07-31 ENCOUNTER — Telehealth: Payer: Self-pay | Admitting: Internal Medicine

## 2017-07-31 NOTE — Telephone Encounter (Signed)
Pt would like to have a call back to see if she can take gavisson for the instant burning in her throat from the acid reflux.

## 2017-08-04 NOTE — Telephone Encounter (Signed)
Left message on machine to call back  

## 2017-08-04 NOTE — Telephone Encounter (Signed)
I was out of the office and the state when this message was sent to my desk top. Yes it is okay to take Gaviscon but I don't know if it will help.

## 2017-08-06 ENCOUNTER — Telehealth: Payer: Self-pay | Admitting: *Deleted

## 2017-08-06 ENCOUNTER — Encounter: Payer: No Typology Code available for payment source | Admitting: Internal Medicine

## 2017-08-06 MED ORDER — AMOXICILLIN 500 MG PO CAPS: ORAL_CAPSULE | ORAL | 0 refills | Status: DC

## 2017-08-06 MED FILL — AMOXICILLIN 500 MG CAPSULE: 500 | 5 days supply | Qty: 15 | Fill #0

## 2017-08-06 NOTE — Telephone Encounter (Signed)
Please see if she has her ENT appt   because I want ENT  to do a good exam   To see  Where the problem is coming from . And if they think a certain antibiotic would help.   Not sure what kind of antibiotic would work depending.    On what is causing the problem .  If  ent appt not for   A week or 2 then we could try amoxicillin  500 mg 1 po tid for 5 days  . Disp 15   We can also get her to GI if ENT doesn't think they see the cause of  The problem.    And not better .

## 2017-08-06 NOTE — Telephone Encounter (Signed)
Spoke with pt and she states that due to her missing a lot of work last month she was not able to obtain insurance. She states she currently does not have any type insurance. Spoke to Dr. Regis Bill and she states she was fine with still offering the amoxicillin but pt does need to see ENT. Pt agreed that to see them if someone from that office would call her to discuss out of pocket cost. Medication was sent to pharmacy of pt's choice.

## 2017-08-06 NOTE — Telephone Encounter (Signed)
Patient called stating she is on protonix and she is having a smell that she knows is coming from her stomach, patient is requesting an antibiotic to be called in. Please advise [phone]   Patient stated she has noticed the smell back in June and her and Dr Regis Bill has been working on the acid reflux problem a little over a month now.

## 2017-08-06 NOTE — Telephone Encounter (Signed)
Pt was made aware. She had another phone message that will be addressed in that encounter. Nothing further is needed

## 2017-08-07 ENCOUNTER — Telehealth: Payer: Self-pay | Admitting: Internal Medicine

## 2017-08-07 NOTE — Telephone Encounter (Signed)
Left message on machine to call back to inform pt that referral was sent for ENT to [facility], [place] [address] Phone [phone] Patient will need to call them to discuss an ov and cost

## 2017-08-07 NOTE — Telephone Encounter (Signed)
Pt has to cancel her cpx. Pt would like to come in dec. Can I create 30  Min slot?

## 2017-08-07 NOTE — Telephone Encounter (Signed)
Ok

## 2017-08-08 NOTE — Telephone Encounter (Signed)
lmom for pt to call back

## 2017-08-12 NOTE — Telephone Encounter (Signed)
Forwarding to you in case you wanted to follow up on

## 2017-08-13 NOTE — Telephone Encounter (Signed)
Pt aware that we have placed referral to ENT, number given to office to inquire about appt once she gets insurance. Nothing further needed.

## 2017-08-18 NOTE — Telephone Encounter (Signed)
lmom for pt to call back

## 2017-08-25 NOTE — Telephone Encounter (Signed)
Pt has been sch

## 2017-10-10 ENCOUNTER — Encounter: Payer: No Typology Code available for payment source | Admitting: Internal Medicine

## 2017-10-14 ENCOUNTER — Other Ambulatory Visit: Payer: Self-pay | Admitting: Internal Medicine

## 2017-10-23 ENCOUNTER — Encounter: Payer: Self-pay | Admitting: Internal Medicine

## 2017-10-23 DIAGNOSIS — K921 Melena: Secondary | ICD-10-CM

## 2017-10-23 NOTE — Telephone Encounter (Signed)
Please advise Dr [name], thanks.   

## 2017-10-27 NOTE — Telephone Encounter (Signed)
Ok to refill Symbicort and albuterol x 1   Ref to GI    See previous  notes   And ? Blood in stool   As far as blood work goes since  Some done in October  would advise waiting until see gi and  Get their advice about blood testing    Lab Results  Component Value Date   WBC 4.9 07/19/2017   HGB 13.0 07/19/2017   HCT 39.7 07/19/2017   PLT 286 07/19/2017   GLUCOSE 104 (H) 07/19/2017   CHOL 177 07/24/2011   TRIG 61.0 07/24/2011   HDL 67.10 07/24/2011   LDLCALC 98 07/24/2011   ALT 13 (L) 07/19/2017   AST 20 07/19/2017   NA 138 07/19/2017   K 3.7 07/19/2017   CL 106 07/19/2017   CREATININE 0.84 07/19/2017   BUN 10 07/19/2017   CO2 25 07/19/2017   TSH 0.55 06/02/2013   MICROALBUR 0.7 05/30/2009

## 2017-10-30 MED ORDER — ALBUTEROL SULFATE HFA 108 (90 BASE) MCG/ACT IN AERS: 2.0000 | INHALATION_SPRAY | RESPIRATORY_TRACT | 1 refills | Status: DC | PRN

## 2017-11-10 ENCOUNTER — Ambulatory Visit: Payer: No Typology Code available for payment source | Admitting: Internal Medicine

## 2017-12-02 ENCOUNTER — Encounter: Payer: Self-pay | Admitting: Gastroenterology

## 2017-12-26 ENCOUNTER — Emergency Department (HOSPITAL_COMMUNITY): Payer: No Typology Code available for payment source

## 2017-12-26 ENCOUNTER — Encounter (HOSPITAL_COMMUNITY): Payer: Self-pay | Admitting: Emergency Medicine

## 2017-12-26 ENCOUNTER — Other Ambulatory Visit: Payer: Self-pay

## 2017-12-26 ENCOUNTER — Encounter: Payer: No Typology Code available for payment source | Admitting: Internal Medicine

## 2017-12-26 ENCOUNTER — Emergency Department (HOSPITAL_COMMUNITY)
Admission: EM | Admit: 2017-12-26 | Discharge: 2017-12-26 | Disposition: A | Discharge status: Home / Self Care | Payer: No Typology Code available for payment source | Attending: Emergency Medicine | Admitting: Emergency Medicine

## 2017-12-26 DIAGNOSIS — R0789 Other chest pain: Secondary | ICD-10-CM | POA: Insufficient documentation

## 2017-12-26 DIAGNOSIS — R0602 Shortness of breath: Secondary | ICD-10-CM | POA: Insufficient documentation

## 2017-12-26 DIAGNOSIS — R05 Cough: Secondary | ICD-10-CM | POA: Insufficient documentation

## 2017-12-26 DIAGNOSIS — R7611 Nonspecific reaction to tuberculin skin test without active tuberculosis: Secondary | ICD-10-CM | POA: Insufficient documentation

## 2017-12-26 DIAGNOSIS — Z79899 Other long term (current) drug therapy: Secondary | ICD-10-CM | POA: Insufficient documentation

## 2017-12-26 DIAGNOSIS — J45901 Unspecified asthma with (acute) exacerbation: Secondary | ICD-10-CM | POA: Insufficient documentation

## 2017-12-26 DIAGNOSIS — Z87891 Personal history of nicotine dependence: Secondary | ICD-10-CM | POA: Insufficient documentation

## 2017-12-26 DIAGNOSIS — I10 Essential (primary) hypertension: Secondary | ICD-10-CM | POA: Insufficient documentation

## 2017-12-26 LAB — BASIC METABOLIC PANEL
Anion gap: 10 (ref 5–15)
BUN: 11 mg/dL (ref 6–20)
CO2: 26 mmol/L (ref 22–32)
Calcium: 9 mg/dL (ref 8.9–10.3)
Chloride: 104 mmol/L (ref 101–111)
Creatinine, Ser: 0.81 mg/dL (ref 0.44–1.00)
GFR calc Af Amer: >60 mL/min (ref >60)
GFR calc non Af Amer: >60 mL/min (ref >60)
Glucose, Bld: 74 mg/dL (ref 65–99)
Potassium: 3.5 mmol/L (ref 3.5–5.1)
Sodium: 140 mmol/L (ref 135–145)

## 2017-12-26 LAB — I-STAT TROPONIN, ED: Troponin i, poc: 0.01 ng/mL (ref 0.00–0.08)

## 2017-12-26 LAB — CBC
HCT: 39 % (ref 36.0–46.0)
Hemoglobin: 12.7 g/dL (ref 12.0–15.0)
MCH: 27.8 pg (ref 26.0–34.0)
MCHC: 32.6 g/dL (ref 30.0–36.0)
MCV: 85.3 fL (ref 78.0–100.0)
Platelets: 306 10*3/uL (ref 150–400)
RBC: 4.57 MIL/uL (ref 3.87–5.11)
RDW: 15.4 % (ref 11.5–15.5)
WBC: 5.6 10*3/uL (ref 4.0–10.5)

## 2017-12-26 MED ORDER — ALBUTEROL SULFATE HFA 108 (90 BASE) MCG/ACT IN AERS: 2.0000 | INHALATION_SPRAY | RESPIRATORY_TRACT | 1 refills | Status: DC | PRN

## 2017-12-26 MED ORDER — DILTIAZEM HCL ER COATED BEADS 180 MG PO CP24: 180.0000 mg | ORAL_CAPSULE | Freq: Every day | ORAL | 1 refills | Status: DC

## 2017-12-26 MED ORDER — PREDNISONE 20 MG PO TABS: ORAL_TABLET | ORAL | 0 refills | Status: DC

## 2017-12-26 MED ORDER — PREDNISONE 20 MG PO TABS
60.0000 mg | ORAL_TABLET | Freq: Once | ORAL | Status: AC
  Administered 2017-12-26: 60 mg via ORAL
  Filled 2017-12-26: qty 3

## 2017-12-26 MED ORDER — ALBUTEROL SULFATE (2.5 MG/3ML) 0.083% IN NEBU
5.0000 mg | INHALATION_SOLUTION | Freq: Once | RESPIRATORY_TRACT | Status: AC
  Administered 2017-12-26: 5 mg via RESPIRATORY_TRACT
  Filled 2017-12-26: qty 6

## 2017-12-26 NOTE — ED Provider Notes (Signed)
[facility] DEPT Provider Note   CSN: [id number] Arrival date & time: 12/26/17  1010     History   Chief Complaint Chief Complaint  Patient presents with  . Chest Pain    HPI Torrence R Camarena is a 52 y.o. female.  HPI  52 year old female presents with chest pain and shortness of breath. Has been on and off for 2 weeks. Nothing specific sets it off. Feels like her prior asthma. She will start to have coughing (non-productive) and then will have chest squeezing and a "grits feeling" in her chest. Albuterol inhaler has helped some. Used to be on symbicort but can no longer afford. No fevers. No leg swelling. Currently asymptomatic. No fevers.  Past Medical History:  Diagnosis Date  . 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   . Allergy   . Asthma   . GERD (gastroesophageal reflux disease)   . Hypertension   . Positive TB test   . Shingles   . TOBACCO USE 03/22/2009   Qualifier: Diagnosis of  By: Regis Bill MD, [name]  Stopped Dec 12 2010      Patient Active Problem List   Diagnosis Date Noted  . Headache 07/02/2013  . Other malaise and fatigue 06/02/2013  . ASTHMA 09/29/2009  . Essential hypertension 05/30/2009  . GERD 05/12/2008  . HOT FLASHES 05/12/2008  . 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 ALLERGIC RHINITIS 08/07/2007    Past Surgical History:  Procedure Laterality Date  . ABDOMINAL HYSTERECTOMY    . BREAST SURGERY     cyst from left breast  . BUNIONECTOMY     both feet  . CHOLECYSTECTOMY    . CYSTECTOMY     left shoulder  . KNEE SURGERY Left   . PANENDOSCOPY    . TUBAL LIGATION    . uterine ablastion     and polyps removed    OB History    No data available       Home Medications    Prior to Admission medications   Medication Sig Start Date End Date Taking? Authorizing Provider  SYMBICORT 80-4.5 MCG/ACT inhaler INHALE 2 PUFFS INTO THE LUNGS 2 (TWO) TIMES DAILY. 10/14/17  Yes [name], [name], MD  albuterol  (PROAIR HFA) 108 (90 Base) MCG/ACT inhaler Inhale 2 puffs into the lungs every 4 (four) hours as needed for wheezing. 12/26/17   Sherwood Gambler, MD  amoxicillin (AMOXIL) 500 MG capsule Take one capsule three times a day for 5 days Patient not taking: Reported on 12/26/2017 08/06/17   [name], [name], MD  diltiazem (CARDIZEM CD) 180 MG 24 hr capsule Take 1 capsule (180 mg total) by mouth daily. 12/26/17   Sherwood Gambler, MD  methocarbamol (ROBAXIN-750) 750 MG tablet Take 1 tablet (750 mg total) by mouth 4 (four) times daily. Patient not taking: Reported on 12/26/2017 06/29/17   Lacretia Leigh, MD  oxyCODONE-acetaminophen (PERCOCET/ROXICET) 5-325 MG tablet Take 2 tablets by mouth every 4 (four) hours as needed for severe pain. Patient not taking: Reported on 12/26/2017 06/29/17   Lacretia Leigh, MD  pantoprazole (PROTONIX) 20 MG tablet Take 1 tablet (20 mg total) by mouth daily. Patient not taking: Reported on 12/26/2017 07/19/17   Isla Pence, MD  predniSONE (DELTASONE) 20 MG tablet 2 tabs po daily x 4 days 12/27/17   Sherwood Gambler, MD  valACYclovir (VALTREX) 1000 MG tablet Take 1 tablet (1,000 mg total) by mouth 3 (three) times daily. For possible shingles Patient  not taking: Reported on 12/26/2017 07/17/17   [name], [name], MD    Family History Family History  Problem Relation Age of Onset  . Hypertension Mother   . Alzheimer's disease Mother   . Hypertension Father   . Prostate cancer Father   . HIV Brother   . Lupus Sister     Social History Social History   Tobacco Use  . Smoking status: Former Smoker    Packs/day: 0.50    Years: 17.00    Pack years: 8.50    Types: Cigarettes    Last attempt to quit: 12/12/2010    Years since quitting: 7.0  . Smokeless tobacco: Never Used  Substance Use Topics  . Alcohol use: No  . Drug use: No     Allergies   Diovan hct [valsartan-hydrochlorothiazide]; Purified water [water, sterile]; Allegra [fexofenadine]; Chocolate; Cimetidine; Citric  acid; Coly-mycin s; Peanut-containing drug products; and Amlodipine   Review of Systems Review of Systems  Constitutional: Negative for fever.  Respiratory: Positive for cough and shortness of breath.   Cardiovascular: Positive for chest pain. Negative for leg swelling.  Gastrointestinal: Negative for abdominal pain.  All other systems reviewed and are negative.    Physical Exam Updated Vital Signs BP (!) 147/96 (BP Location: Left Arm)   Pulse 72   Temp 98 F (36.7 C) (Oral)   Resp 16   Ht 5' (1.524 m)   Wt 57.6 kg (127 lb)   SpO2 100%   BMI 24.80 kg/m   Physical Exam  Constitutional: She is oriented to person, place, and time. She appears well-developed and well-nourished.  HENT:  Head: Normocephalic and atraumatic.  Right Ear: External ear normal.  Left Ear: External ear normal.  Nose: Nose normal.  Eyes: Right eye exhibits no discharge. Left eye exhibits no discharge.  Cardiovascular: Normal rate, regular rhythm and normal heart sounds.  Pulmonary/Chest: Effort normal. She has wheezes (mild, expiratory, intermittent).  Abdominal: Soft. There is no tenderness.  Musculoskeletal:       Right lower leg: She exhibits no edema.       Left lower leg: She exhibits no edema.  Neurological: She is alert and oriented to person, place, and time.  Skin: Skin is warm and dry.  Nursing note and vitals reviewed.    ED Treatments / Results  Labs (all labs ordered are listed, but only abnormal results are displayed) Labs Reviewed  BASIC METABOLIC PANEL  CBC  I-STAT TROPONIN, ED    EKG  EKG Interpretation  Date/Time:  Friday December 26 2017 10:20:40 EST Ventricular Rate:  74 PR Interval:    QRS Duration: 79 QT Interval:  408 QTC Calculation: 453 R Axis:   14 Text Interpretation:  Sinus rhythm Probable left atrial enlargement Abnormal R-wave progression, early transition nonspecific T wave flattening similar to May 2011 Confirmed by Sherwood Gambler [phone]) on  12/26/2017 12:28:04 PM       Radiology Dg Chest 2 View  Result Date: 12/26/2017 CLINICAL DATA:  52 year old female with increasing cough congestion shortness of breath and chest pain over the past 2 weeks. EXAM: CHEST  2 VIEW COMPARISON:  12/24/2016. FINDINGS: Lung volumes remain normal. Cardiac size is stable at the upper limits of normal. Mildly tortuous thoracic aorta is stable. Other mediastinal contours are within normal limits. Visualized tracheal air column is within normal limits. The lungs are clear. No pneumothorax or pleural effusion. No acute osseous abnormality identified. Stable cholecystectomy clips. Negative visible bowel gas pattern. IMPRESSION: Negative.  No acute cardiopulmonary abnormality. Electronically Signed   By: Genevie Ann M.D.   On: 12/26/2017 11:38    Procedures Procedures (including critical care time)  Medications Ordered in ED Medications  albuterol (PROVENTIL) (2.5 MG/3ML) 0.083% nebulizer solution 5 mg (5 mg Nebulization Given 12/26/17 1342)  predniSONE (DELTASONE) tablet 60 mg (60 mg Oral Given 12/26/17 1341)     Initial Impression / Assessment and Plan / ED Course  I have reviewed the triage vital signs and the nursing notes.  Pertinent labs & imaging results that were available during my care of the patient were reviewed by me and considered in my medical decision making (see chart for details).     Patient presentation is c/w asthma. Has had waxing and waning course for 2 weeks. Will provide steroid burst in addition to refilling her albuterol. She also is hypertensive, and has not taken her BP meds in a few months. Would like to restart, will be given Rx. I doubt ACS, PE, Dissection. No PNA on CXR. Appears stable for d/c home.  Final Clinical Impressions(s) / ED Diagnoses   Final diagnoses:  Atypical chest pain  Exacerbation of asthma, unspecified asthma severity, unspecified whether persistent  Essential hypertension    ED Discharge Orders         Ordered    predniSONE (DELTASONE) 20 MG tablet     12/26/17 1409    albuterol (PROAIR HFA) 108 (90 Base) MCG/ACT inhaler  Every 4 hours PRN     12/26/17 1409    diltiazem (CARDIZEM CD) 180 MG 24 hr capsule  Daily     12/26/17 1409       Sherwood Gambler, MD 12/27/17 1145

## 2017-12-26 NOTE — ED Triage Notes (Signed)
Pt complaint of chest pain describes as squeezing; associated cough and SOB. Onset two weeks ago.

## 2017-12-26 NOTE — ED Notes (Signed)
Pt is alert and oriented x 4 and is verbally responsive.  Pt denies pain this time and is fnd in room resting

## 2018-01-03 ENCOUNTER — Emergency Department (HOSPITAL_COMMUNITY): Payer: No Typology Code available for payment source

## 2018-01-03 ENCOUNTER — Encounter (HOSPITAL_COMMUNITY): Payer: Self-pay | Admitting: Nurse Practitioner

## 2018-01-03 MED ORDER — ALBUTEROL SULFATE (2.5 MG/3ML) 0.083% IN NEBU
5.0000 mg | INHALATION_SOLUTION | Freq: Once | RESPIRATORY_TRACT | Status: AC
  Administered 2018-01-03: 5 mg via RESPIRATORY_TRACT
  Filled 2018-01-03: qty 6

## 2018-01-03 NOTE — ED Notes (Signed)
Pt c/o a productive cough with clear sputum for about 3 weeks. She has been using her inhaler at home

## 2018-01-03 NOTE — ED Triage Notes (Signed)
Pt is c/o of cough, generalized joint pain adding that she may be having an asthma attack.

## 2018-01-04 ENCOUNTER — Emergency Department (HOSPITAL_COMMUNITY)
Admission: EM | Admit: 2018-01-04 | Discharge: 2018-01-04 | Disposition: A | Discharge status: Home / Self Care | Payer: No Typology Code available for payment source | Attending: Emergency Medicine | Admitting: Emergency Medicine

## 2018-01-04 DIAGNOSIS — I10 Essential (primary) hypertension: Secondary | ICD-10-CM | POA: Insufficient documentation

## 2018-01-04 DIAGNOSIS — R05 Cough: Secondary | ICD-10-CM | POA: Insufficient documentation

## 2018-01-04 DIAGNOSIS — Z79899 Other long term (current) drug therapy: Secondary | ICD-10-CM | POA: Insufficient documentation

## 2018-01-04 DIAGNOSIS — J45909 Unspecified asthma, uncomplicated: Secondary | ICD-10-CM | POA: Insufficient documentation

## 2018-01-04 DIAGNOSIS — Z87891 Personal history of nicotine dependence: Secondary | ICD-10-CM | POA: Insufficient documentation

## 2018-01-04 DIAGNOSIS — R059 Cough, unspecified: Secondary | ICD-10-CM

## 2018-01-04 DIAGNOSIS — Z91010 Allergy to peanuts: Secondary | ICD-10-CM | POA: Insufficient documentation

## 2018-01-04 MED ORDER — ALBUTEROL SULFATE HFA 108 (90 BASE) MCG/ACT IN AERS
2.0000 | INHALATION_SPRAY | Freq: Once | RESPIRATORY_TRACT | Status: AC
  Administered 2018-01-04: 2 via RESPIRATORY_TRACT
  Filled 2018-01-04: qty 6.7

## 2018-01-04 NOTE — ED Provider Notes (Signed)
[facility] DEPT Provider Note   CSN: [id number] Arrival date & time: 01/04/18  0058     History   Chief Complaint Chief Complaint  Patient presents with  . Cough  . URI    HPI [name] is a 52 y.o. female.  52 year old female with a history of asthma, reflux, hypertension presents to the emergency department for evaluation of a cough.  Cough has been mostly dry, but was productive of clear sputum on one occasion.  She has had this cough for approximately 3 weeks.  It is associated with a "tingling" in her central chest.  She reports improvement in her symptoms with an albuterol nebulizer given in triage.  She was treated with albuterol at a prior ED visit 1 week ago, but was unable to fill her prescription for an inhaler.  She has also been noncompliant with her antihypertensive medication because she cannot afford it.  She has not had any associated fevers, syncope.  No leg swelling, recent surgeries or hospitalizations, prolonged travel.  She denies hemoptysis.   The history is provided by the patient. No language interpreter was used.  Cough   URI   Associated symptoms include cough.    Past Medical History:  Diagnosis Date  . 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   . Allergy   . Asthma   . GERD (gastroesophageal reflux disease)   . Hypertension   . Positive TB test   . Shingles   . TOBACCO USE 03/22/2009   Qualifier: Diagnosis of  By: Regis Bill MD, [name]  Stopped Dec 12 2010      Patient Active Problem List   Diagnosis Date Noted  . Headache 07/02/2013  . Other malaise and fatigue 06/02/2013  . ASTHMA 09/29/2009  . Essential hypertension 05/30/2009  . GERD 05/12/2008  . HOT FLASHES 05/12/2008  . 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 ALLERGIC RHINITIS 08/07/2007    Past Surgical History:  Procedure Laterality Date  . ABDOMINAL HYSTERECTOMY    . BREAST SURGERY     cyst from left breast  . BUNIONECTOMY     both  feet  . CHOLECYSTECTOMY    . CYSTECTOMY     left shoulder  . KNEE SURGERY Left   . PANENDOSCOPY    . TUBAL LIGATION    . uterine ablastion     and polyps removed    OB History    No data available       Home Medications    Prior to Admission medications   Medication Sig Start Date End Date Taking? Authorizing Provider  albuterol (PROAIR HFA) 108 (90 Base) MCG/ACT inhaler Inhale 2 puffs into the lungs every 4 (four) hours as needed for wheezing. 12/26/17   Sherwood Gambler, MD  amoxicillin (AMOXIL) 500 MG capsule Take one capsule three times a day for 5 days Patient not taking: Reported on 12/26/2017 08/06/17   [name], [name], MD  diltiazem (CARDIZEM CD) 180 MG 24 hr capsule Take 1 capsule (180 mg total) by mouth daily. 12/26/17   Sherwood Gambler, MD  methocarbamol (ROBAXIN-750) 750 MG tablet Take 1 tablet (750 mg total) by mouth 4 (four) times daily. Patient not taking: Reported on 12/26/2017 06/29/17   Lacretia Leigh, MD  oxyCODONE-acetaminophen (PERCOCET/ROXICET) 5-325 MG tablet Take 2 tablets by mouth every 4 (four) hours as needed for severe pain. Patient not taking: Reported on 12/26/2017 06/29/17   Lacretia Leigh, MD  pantoprazole (PROTONIX) 20 MG tablet  Take 1 tablet (20 mg total) by mouth daily. Patient not taking: Reported on 12/26/2017 07/19/17   Isla Pence, MD  predniSONE (DELTASONE) 20 MG tablet 2 tabs po daily x 4 days 12/27/17   Sherwood Gambler, MD  SYMBICORT 80-4.5 MCG/ACT inhaler INHALE 2 PUFFS INTO THE LUNGS 2 (TWO) TIMES DAILY. 10/14/17   [name], [name], MD  valACYclovir (VALTREX) 1000 MG tablet Take 1 tablet (1,000 mg total) by mouth 3 (three) times daily. For possible shingles Patient not taking: Reported on 12/26/2017 07/17/17   [name], [name], MD    Family History Family History  Problem Relation Age of Onset  . Hypertension Mother   . Alzheimer's disease Mother   . Hypertension Father   . Prostate cancer Father   . HIV Brother   . Lupus Sister      Social History Social History   Tobacco Use  . Smoking status: Former Smoker    Packs/day: 0.50    Years: 17.00    Pack years: 8.50    Types: Cigarettes    Last attempt to quit: 12/12/2010    Years since quitting: 7.0  . Smokeless tobacco: Never Used  Substance Use Topics  . Alcohol use: No  . Drug use: No     Allergies   Diovan hct [valsartan-hydrochlorothiazide]; Purified water [water, sterile]; Allegra [fexofenadine]; Chocolate; Cimetidine; Citric acid; Coly-mycin s; Peanut-containing drug products; and Amlodipine   Review of Systems Review of Systems  Respiratory: Positive for cough.    Ten systems reviewed and are negative for acute change, except as noted in the HPI.    Physical Exam Updated Vital Signs BP (!) 138/92 (BP Location: Right Arm)   Pulse 77   Temp 97.8 F (36.6 C) (Oral)   Resp 16   Ht 5' (1.524 m)   Wt 57.6 kg (127 lb)   SpO2 93%   BMI 24.80 kg/m   Physical Exam  Constitutional: She is oriented to person, place, and time. She appears well-developed and well-nourished. No distress.  Nontoxic appearing and in NAD  HENT:  Head: Normocephalic and atraumatic.  Eyes: Conjunctivae and EOM are normal. No scleral icterus.  Neck: Normal range of motion.  Cardiovascular: Normal rate, regular rhythm and intact distal pulses.  Pulmonary/Chest: Effort normal. No stridor. No respiratory distress. She has no wheezes.  Respirations even and unlabored. Lungs CTAB.  Musculoskeletal: Normal range of motion.  Neurological: She is alert and oriented to person, place, and time. She exhibits normal muscle tone. Coordination normal.  Skin: Skin is warm and dry. No rash noted. She is not diaphoretic. No erythema. No pallor.  Psychiatric: She has a normal mood and affect. Her behavior is normal.  Nursing note and vitals reviewed.    ED Treatments / Results  Labs (all labs ordered are listed, but only abnormal results are displayed) Labs Reviewed - No data to  display  EKG  EKG Interpretation None       Radiology Dg Chest 2 View  Result Date: 01/03/2018 CLINICAL DATA:  Patient with productive cough. EXAM: CHEST  2 VIEW COMPARISON:  Chest radiograph 12/26/2017. FINDINGS: Stable cardiac and mediastinal contours. No consolidative pulmonary opacities. No pleural effusion or pneumothorax. Thoracic spine degenerative changes. IMPRESSION: No acute cardiopulmonary process. Electronically Signed   By: Lovey Newcomer M.D.   On: 01/03/2018 23:56    Procedures Procedures (including critical care time)  Medications Ordered in ED Medications  albuterol (PROVENTIL) (2.5 MG/3ML) 0.083% nebulizer solution 5 mg (5 mg Nebulization  Given 01/03/18 2335)  albuterol (PROVENTIL HFA;VENTOLIN HFA) 108 (90 Base) MCG/ACT inhaler 2 puff (2 puffs Inhalation Provided for home use 01/04/18 0232)     Initial Impression / Assessment and Plan / ED Course  I have reviewed the triage vital signs and the nursing notes.  Pertinent labs & imaging results that were available during my care of the patient were reviewed by me and considered in my medical decision making (see chart for details).     Patient presenting for persistent cough.  X-ray reassuring without acute cardiopulmonary process.  Symptoms have improved following an albuterol nebulizer in triage.  Lung sounds clear to auscultation bilaterally.  Respirations even and unlabored.  Suspect that symptoms may be worsening due to untreated asthma.  Albuterol inhaler given in the emergency department.  Will have her follow-up with her primary care doctor for repeat assessment of her symptoms.  Have stressed the need for her to continue her daily blood pressure medication.  Return precautions discussed and provided. Patient discharged in stable condition with no unaddressed concerns.   Final Clinical Impressions(s) / ED Diagnoses   Final diagnoses:  Cough  Hypertension not at goal    ED Discharge Orders    None         Antonietta Breach, PA-C 01/04/18 4142    Carmin Muskrat, MD 01/04/18 [phone]

## 2018-01-04 NOTE — Discharge Instructions (Signed)
Use 2 puffs of an albuterol inhaler every 6 hours as needed for wheezing, cough, shortness of breath.  Continue your daily prescribed medications and follow-up with your primary care doctor.  You should have your blood pressure rechecked within the next week.  You may return to the emergency department for new or concerning symptoms.

## 2018-01-04 NOTE — ED Notes (Signed)
No answer when called for a room. 

## 2018-02-12 ENCOUNTER — Other Ambulatory Visit: Payer: Self-pay

## 2018-02-12 ENCOUNTER — Ambulatory Visit (INDEPENDENT_AMBULATORY_CARE_PROVIDER_SITE_OTHER): Payer: BLUE CROSS/BLUE SHIELD | Admitting: Internal Medicine

## 2018-02-12 ENCOUNTER — Ambulatory Visit (AMBULATORY_SURGERY_CENTER): Payer: Self-pay

## 2018-02-12 ENCOUNTER — Encounter: Payer: No Typology Code available for payment source | Admitting: Gastroenterology

## 2018-02-12 ENCOUNTER — Encounter: Payer: Self-pay | Admitting: Internal Medicine

## 2018-02-12 ENCOUNTER — Other Ambulatory Visit (INDEPENDENT_AMBULATORY_CARE_PROVIDER_SITE_OTHER): Payer: BLUE CROSS/BLUE SHIELD

## 2018-02-12 VITALS — BP 122/72 | HR 74 | Ht 60.0 in | Wt 133.2 lb

## 2018-02-12 VITALS — Ht 60.0 in | Wt 134.0 lb

## 2018-02-12 DIAGNOSIS — J45991 Cough variant asthma: Secondary | ICD-10-CM

## 2018-02-12 DIAGNOSIS — K921 Melena: Secondary | ICD-10-CM

## 2018-02-12 LAB — NITRIC OXIDE: Nitric Oxide: 61

## 2018-02-12 LAB — CBC WITH DIFFERENTIAL/PLATELET
Basophils Absolute: 0 10*3/uL (ref 0.0–0.1)
Basophils Relative: 0.8 % (ref 0.0–3.0)
Eosinophils Absolute: 0.4 10*3/uL (ref 0.0–0.7)
Eosinophils Relative: 9.1 % — ABNORMAL HIGH (ref 0.0–5.0)
HCT: 40.8 % (ref 36.0–46.0)
Hemoglobin: 13.5 g/dL (ref 12.0–15.0)
Lymphocytes Relative: 45.3 % (ref 12.0–46.0)
Lymphs Abs: 2 10*3/uL (ref 0.7–4.0)
MCHC: 33.1 g/dL (ref 30.0–36.0)
MCV: 84.7 fl (ref 78.0–100.0)
Monocytes Absolute: 0.3 10*3/uL (ref 0.1–1.0)
Monocytes Relative: 7.7 % (ref 3.0–12.0)
Neutro Abs: 1.6 10*3/uL (ref 1.4–7.7)
Neutrophils Relative %: 37.1 % — ABNORMAL LOW (ref 43.0–77.0)
Platelets: 286 10*3/uL (ref 150.0–400.0)
RBC: 4.82 Mil/uL (ref 3.87–5.11)
RDW: 15.9 % — ABNORMAL HIGH (ref 11.5–15.5)
WBC: 4.4 10*3/uL (ref 4.0–10.5)

## 2018-02-12 MED ORDER — PANTOPRAZOLE SODIUM 40 MG PO TBEC
DELAYED_RELEASE_TABLET | ORAL | 2 refills | Status: DC
Start: 2018-02-12 — End: 2018-06-16

## 2018-02-12 MED ORDER — ALBUTEROL SULFATE HFA 108 (90 BASE) MCG/ACT IN AERS
INHALATION_SPRAY | RESPIRATORY_TRACT | 1 refills | Status: DC
Start: 2018-02-12 — End: 2018-06-29

## 2018-02-12 MED ORDER — NA SULFATE-K SULFATE-MG SULF 17.5-3.13-1.6 GM/177ML PO SOLN: 1.0000 | Freq: Once | ORAL | 0 refills | Status: AC

## 2018-02-12 NOTE — Assessment & Plan Note (Signed)
-   PFT's wnl 02/20/11  While on symbicort with improved symptom control > f/u pulm prn - FENO 02/12/2018  =   62 on symb 80 2bid with poor hfa - Spirometry 02/12/2018  Nl   - 02/12/2018  After extensive coaching inhaler device  effectiveness =    75% with coaching > continue symb 80 2bid and add max gerd rx    DDX of  difficult airways management almost all start with A and  include Adherence, Ace Inhibitors, Acid Reflux, Active Sinus Disease, Alpha 1 Antitripsin deficiency, Anxiety masquerading as Airways dz,  ABPA,  Allergy(esp in young), Aspiration (esp in elderly), Adverse effects of meds,  Active smokers, A bunch of PE's (a small clot burden can't cause this syndrome unless there is already severe underlying pulm or vascular dz with poor reserve) plus two Bs  = Bronchiectasis and Beta blocker use..and one C= CHF    In this case Adherence is the biggest issue and starts with  inability to use HFA effectively and also  understand that SABA treats the symptoms but doesn't get to the underlying problem (inflammation).  I used  the analogy of putting steroid cream on a rash to help explain the meaning of topical therapy and the need to get the drug to the target tissue.   - see hfa teaching   ? Acid (or non-acid) GERD > always difficult to exclude as up to 75% of pts in some series report no assoc GI/ Heartburn symptoms> rec max (24h)  acid suppression and diet restrictions/ reviewed and instructions given in writing.   ? Allergy  > send profile   ?Active  Sinus Dz > low threshold to sinus ct if not improved   ? Anxiety/VCD dx of exclusion    Will try max rx for gerd and low dose ics/laba then return for f/u in 4 weeks   Total time devoted to counseling  > 50 % of initial (not seen in > 3 y) 60 min office visit:  review case with pt/ discussion of options/alternatives/ personally creating written customized instructions  in presence of pt  then going over those specific  Instructions directly  with the pt including how to use all of the meds but in particular covering each new medication in detail and the difference between the maintenance= "automatic" meds and the prns using an action plan format for the latter (If this problem/symptom => do that organization reading Left to right).  Please see AVS from this visit for a full list of these instructions which I personally wrote for this pt and  are unique to this visit.

## 2018-02-12 NOTE — Progress Notes (Signed)
Denies allergies to eggs or soy products. Denies complication of anesthesia or sedation. Denies use of weight loss medication. Denies use of O2.   Emmi instructions declined.  

## 2018-02-12 NOTE — Progress Notes (Signed)
Subjective:    Patient ID: [name], female    DOB: 21-Aug-1966, 52 y.o.   MRN: [id number]    Brief patient profile:  83  yobf  Quit smoking 2012 with onset of variable sob 2010 but nl pfts while on symbicort 02/20/11    History of Present Illness  January 08, 2011 ov cc worse dyspnea, wheezing no cough x 2 weeks no better p inalers, feels something stuck in ssn but swallowing ok. general chest tightness not better with saba.  Patient Instructions:  1) Work on inhaler technique: relax and blow all the way out then take a nice smooth deep breath back in, triggering the inhaler at same time you start breathing in  2) Symbicort 2 puffs first thing in am and 2 puffs again in pm about 12 hours later  3) Change nexium Take one 30-60 min before first meal of the day and add pepcid 20 mg one at bedtime  4) GERD (REFLUX)      02/20/2011 ov / Melvyn Novas   Cc breathing some better on symbicort but still having episodes of chest tightness lasting form a min to an hour once or twice weekly.  Overall much better vs baseline, no noct symptoms or need for saba, much less sense of something stuck in back of throat, no cough.   rec No change rx> f/u prn        02/12/2018  f/u ov/Kalasia Crafton re: ? Flare of asthma  Not even 50% better p er rx x 2 in Feb 2019  Chief Complaint  Patient presents with  . Pulmonary Consult    Self referral.  She c/o SOB x 3 months "all the time"- with or without any exertion. She states when she lies down she hears "crackling noise".  She also c/o non prod cough- usually worse at night and can be triggered by cleaning products. She has been using proair 3-4 x per day, but none in the past wk.   Dyspnea:  Variably sob with exertion / prior to Nov 04 2017 was fine with good aerobic capacity consistently fast walk up hills off maint rx x years on otc nexium only bedtime and variably bad breath/ hb on this rx  Cough: 30 m to an hour p hs/ not as common later or early am /worse with exp to  clearing products Sleep: most nights ok SABA use:  Way over using now where wasn't using anything prior to Nov 04 2017 and not helping   No  Obvious identified patterns in  day to day or daytime variability or assoc excess/ purulent sputum or mucus plugs or hemoptysis or cp or chest tightness, subjective wheeze or overt sinus   symptoms. No unusual exposure hx or h/o childhood pna/ asthma or knowledge of premature birth.   . Also denies any obvious fluctuation of symptoms with weather or environmental changes or other aggravating or alleviating factors except as outlined above   Current Allergies, Complete Past Medical History, Past Surgical History, Family History, and Social History were reviewed in [employer] record.  ROS  The following are not active complaints unless bolded Hoarseness, sore throat, dysphagia, dental problems, itching, sneezing,  nasal congestion or discharge of excess mucus or purulent secretions, ear ache,   fever, chills, sweats, unintended wt loss or wt gain, classically pleuritic or exertional cp,  orthopnea pnd or leg swelling, presyncope, palpitations, abdominal pain, anorexia, nausea, vomiting, diarrhea  or change in bowel habits or change in  bladder habits, change in stools or change in urine, dysuria, hematuria,  rash, arthralgias, visual complaints, headache, numbness, weakness or ataxia or problems with walking or coordination,  change in mood/affect or memory.        Current Meds  Medication Sig  . albuterol (PROAIR HFA) 108 (90 Base) MCG/ACT inhaler Inhale 2 puffs into the lungs every 4 (four) hours as needed for wheezing.  . diltiazem (CARDIZEM CD) 180 MG 24 hr capsule Take 1 capsule (180 mg total) by mouth daily.  . SYMBICORT 80-4.5 MCG/ACT inhaler INHALE 2 PUFFS INTO THE LUNGS 2 (TWO) TIMES DAILY.  Plus Nexium otc at hs         Past Medical History:  Allergic rhinitis  childbirth  Pos PPD on INH 2009  Asthma  - HFA 25 %  effective technique September 29, 2009 > 50% January 08, 2011  - PFT's wnl 02/20/11   Family History:  MOM menopause age 45  child has asthma and allergy  Prostate CA- Father  Lung CA- PGF (was never a smoker)    Social History:  Divorced  Former smoker. Quit 12/12/10. Smoked for approx 10 yrs up to 1/4 ppd  Alcohol use-yes  employed [employer]               Objective:   Physical Exam   amb bf nad   wt 148 > 152 January 08, 2011 > 149  02/20/11 > 02/12/2018  133   Vital signs reviewed - Note on arrival 02 sats  98% on RA      HEENT: nl dentition, turbinates bilaterally, and oropharynx. Nl external ear canals without cough reflex   NECK :  without JVD/Nodes/TM/ nl carotid upstrokes bilaterally   LUNGS: no acc muscle use,  Nl contour chest  With mostly pseudowheeze/ worse on insp    CV:  RRR  no s3 or murmur or increase in P2, and no edema   ABD:  soft and nontender with nl inspiratory excursion in the supine position. No bruits or organomegaly appreciated, bowel sounds nl  MS:  Nl gait/ ext warm without deformities, calf tenderness, cyanosis or clubbing No obvious joint restrictions   SKIN: warm and dry without lesions    NEURO:  alert, approp, nl sensorium with  no motor or cerebellar deficits apparent.       I personally reviewed images and agree with radiology impression as follows:  CXR:   01/03/18 No acute cardiopulmonary process.   Labs ordered 02/12/2018  Allergy profile  Assessment & Plan:

## 2018-02-12 NOTE — Patient Instructions (Addendum)
Plan A = Automatic = Symbicort 80 Take 2 puffs first thing in am and then another 2 puffs about 12 hours later.    Work on inhaler technique:  relax and gently blow all the way out then take a nice smooth deep breath back in, triggering the inhaler at same time you start breathing in.  Hold for up to 5 seconds if you can. Blow out thru nose. Rinse and gargle with water when done    Plan B = Backup Only use your albuterol(Proventil/Proair/Ventolin)  as a rescue medication to be used if you can't catch your breath by resting or doing a relaxed purse lip breathing pattern.  - The less you use it, the better it will work when you need it. - Ok to use the inhaler up to 2 puffs  every 4 hours if you must but call for appointment if use goes up over your usual need - Don't leave home without it !!  (think of it like the spare tire for your car)     Protonix Take 30- 60 min before your first and last meals of the day    GERD (REFLUX)  is an extremely common cause of respiratory symptoms just like yours , many times with no obvious heartburn at all.    It can be treated with medication, but also with lifestyle changes including elevation of the head of your bed (ideally with 6 inch  bed blocks),  Smoking cessation, avoidance of late meals, excessive alcohol, and avoid fatty foods, chocolate, peppermint, colas, red wine, and acidic juices such as orange juice.  NO MINT OR MENTHOL PRODUCTS SO NO COUGH DROPS   USE SUGARLESS CANDY INSTEAD (Jolley ranchers or Stover's or Life Savers) or even ice chips will also do - the key is to swallow to prevent all throat clearing. NO OIL BASED VITAMINS - use powdered substitutes.    Please remember to go to the lab department downstairs in the basement  for your tests - we will call you with the results when they are available.       Please schedule a follow up office visit in 4 weeks, sooner if needed

## 2018-02-13 ENCOUNTER — Encounter: Payer: Self-pay | Admitting: Gastroenterology

## 2018-02-13 LAB — RESPIRATORY ALLERGY PROFILE REGION II ~~LOC~~
Allergen, A. alternata, m6: <0.1 kU/L
Allergen, Cedar tree, t12: <0.1 kU/L
Allergen, Comm Silver Birch, t9: <0.1 kU/L
Allergen, Cottonwood, t14: <0.1 kU/L
Allergen, D pternoyssinus,d7: <0.1 kU/L
Allergen, Mouse Urine Protein, e78: <0.1 kU/L
Allergen, Mulberry, t76: <0.1 kU/L
Allergen, Oak,t7: <0.1 kU/L
Allergen, P. notatum, m1: <0.1 kU/L
Aspergillus fumigatus, m3: <0.1 kU/L
Bermuda Grass: <0.1 kU/L
Box Elder IgE: <0.1 kU/L
CLADOSPORIUM HERBARUM (M2) IGE: <0.1 kU/L
COMMON RAGWEED (SHORT) (W1) IGE: <0.1 kU/L
Cat Dander: <0.1 kU/L
Class: 0
Class: 0
Class: 0
Class: 0
Class: 0
Class: 0
Class: 0
Class: 0
Class: 0
Class: 0
Class: 0
Class: 0
Class: 0
Class: 0
Class: 0
Class: 0
Class: 0
Class: 0
Class: 0
Class: 0
Class: 0
Class: 0
Class: 0
Class: 2
Cockroach: <0.1 kU/L
D. farinae: <0.1 kU/L
Dog Dander: 0.11 kU/L — ABNORMAL HIGH
Elm IgE: <0.1 kU/L
IgE (Immunoglobulin E), Serum: 50 kU/L (ref <=114)
Johnson Grass: 0.15 kU/L — ABNORMAL HIGH
Pecan/Hickory Tree IgE: <0.1 kU/L
Rough Pigweed  IgE: <0.1 kU/L
Sheep Sorrel IgE: <0.1 kU/L
Timothy Grass: 0.8 kU/L — ABNORMAL HIGH

## 2018-02-13 LAB — INTERPRETATION:

## 2018-02-13 NOTE — Progress Notes (Signed)
Spoke with pt and notified of results per Dr. Wert. Pt verbalized understanding and denied any questions. 

## 2018-02-23 ENCOUNTER — Encounter: Payer: No Typology Code available for payment source | Admitting: Internal Medicine

## 2018-02-26 ENCOUNTER — Encounter: Payer: Self-pay | Admitting: Gastroenterology

## 2018-02-26 ENCOUNTER — Other Ambulatory Visit: Payer: Self-pay

## 2018-02-26 ENCOUNTER — Ambulatory Visit (AMBULATORY_SURGERY_CENTER): Payer: BLUE CROSS/BLUE SHIELD | Admitting: Gastroenterology

## 2018-02-26 VITALS — BP 144/88 | HR 68 | Temp 98.0°F | Resp 12 | Ht 60.0 in | Wt 134.0 lb

## 2018-02-26 DIAGNOSIS — D123 Benign neoplasm of transverse colon: Secondary | ICD-10-CM

## 2018-02-26 DIAGNOSIS — Z1212 Encounter for screening for malignant neoplasm of rectum: Secondary | ICD-10-CM | POA: Diagnosis not present

## 2018-02-26 DIAGNOSIS — D125 Benign neoplasm of sigmoid colon: Secondary | ICD-10-CM

## 2018-02-26 DIAGNOSIS — Z1211 Encounter for screening for malignant neoplasm of colon: Secondary | ICD-10-CM | POA: Diagnosis not present

## 2018-02-26 DIAGNOSIS — K635 Polyp of colon: Secondary | ICD-10-CM

## 2018-02-26 MED ORDER — SODIUM CHLORIDE 0.9 % IV SOLN: 500.0000 mL | Freq: Once | INTRAVENOUS | Status: DC

## 2018-02-26 NOTE — Patient Instructions (Signed)
Impression/Recommendations:  Polyp handout given to patient. Hemorrhoid handout given to patient.  Repeat colonoscopy in 3-5 years for surveillance.  Date to be determined after pathology results reviewed.  Resume previous diet. Continue present medications.  No aspirin, naproxen, or other NSAID drugs for 2 weeks.  Tylenol only until Mar 13, 2018.  YOU HAD AN ENDOSCOPIC PROCEDURE TODAY AT [facility]:   Refer to the procedure report that was given to you for any specific questions about what was found during the examination.  If the procedure report does not answer your questions, please call your gastroenterologist to clarify.  If you requested that your care partner not be given the details of your procedure findings, then the procedure report has been included in a sealed envelope for you to review at your convenience later.  YOU SHOULD EXPECT: Some feelings of bloating in the abdomen. Passage of more gas than usual.  Walking can help get rid of the air that was put into your GI tract during the procedure and reduce the bloating. If you had a lower endoscopy (such as a colonoscopy or flexible sigmoidoscopy) you may notice spotting of blood in your stool or on the toilet paper. If you underwent a bowel prep for your procedure, you may not have a normal bowel movement for a few days.  Please Note:  You might notice some irritation and congestion in your nose or some drainage.  This is from the oxygen used during your procedure.  There is no need for concern and it should clear up in a day or so.  SYMPTOMS TO REPORT IMMEDIATELY:   Following lower endoscopy (colonoscopy or flexible sigmoidoscopy):  Excessive amounts of blood in the stool  Significant tenderness or worsening of abdominal pains  Swelling of the abdomen that is new, acute  Fever of 100F or higher For urgent or emergent issues, a gastroenterologist can be reached at any hour by calling [phone]  [phone].   DIET:  We do recommend a small meal at first, but then you may proceed to your regular diet.  Drink plenty of fluids but you should avoid alcoholic beverages for 24 hours.  ACTIVITY:  You should plan to take it easy for the rest of today and you should NOT DRIVE or use heavy machinery until tomorrow (because of the sedation medicines used during the test).    FOLLOW UP: Our staff will call the number listed on your records the next business day following your procedure to check on you and address any questions or concerns that you may have regarding the information given to you following your procedure. If we do not reach you, we will leave a message.  However, if you are feeling well and you are not experiencing any problems, there is no need to return our call.  We will assume that you have returned to your regular daily activities without incident.  If any biopsies were taken you will be contacted by phone or by letter within the next 1-3 weeks.  Please call us at [phone] if you have not heard about the biopsies in 3 weeks.    SIGNATURES/CONFIDENTIALITY: You and/or your care partner have signed paperwork which will be entered into your electronic medical record.  These signatures attest to the fact that that the information above on your After Visit Summary has been reviewed and is understood.  Full responsibility of the confidentiality of this discharge information lies with you and/or your care-partner.

## 2018-02-26 NOTE — Progress Notes (Signed)
Called to room to assist during endoscopic procedure.  Patient ID and intended procedure confirmed with present staff. Received instructions for my participation in the procedure from the performing physician.  

## 2018-02-26 NOTE — Progress Notes (Signed)
Pt's states no medical or surgical changes since previsit or office visit. 

## 2018-02-26 NOTE — Progress Notes (Signed)
A/ox3 pleased with MAC, report to Jane RN 

## 2018-02-26 NOTE — Op Note (Signed)
[facility] Patient Name: [name] Procedure Date: 02/26/2018 9:15 AM MRN: [id number] Endoscopist: [name] , MD Age: 52 Referring MD:  Date of Birth: 06/17/1966 Gender: Female Account #: [id number] Procedure:                Colonoscopy Indications:              Screening for colorectal malignant neoplasm Medicines:                Monitored Anesthesia Care Procedure:                Pre-Anesthesia Assessment:                           - Prior to the procedure, a History and Physical                            was performed, and patient medications and                            allergies were reviewed. The patient's tolerance of                            previous anesthesia was also reviewed. The risks                            and benefits of the procedure and the sedation                            options and risks were discussed with the patient.                            All questions were answered, and informed consent                            was obtained. Prior Anticoagulants: The patient has                            taken no previous anticoagulant or antiplatelet                            agents. ASA Grade Assessment: II - A patient with                            mild systemic disease. After reviewing the risks                            and benefits, the patient was deemed in                            satisfactory condition to undergo the procedure.                           After obtaining informed consent, the colonoscope  was passed under direct vision. Throughout the                            procedure, the patient's blood pressure, pulse, and                            oxygen saturations were monitored continuously. The                            Colonoscope was introduced through the anus and                            advanced to the the cecum, identified by                            appendiceal orifice and  ileocecal valve. The                            ileocecal valve, appendiceal orifice, and rectum                            were photographed. The quality of the bowel                            preparation was excellent. The colonoscopy was                            performed without difficulty. The patient tolerated                            the procedure well. Scope In: 9:32:31 AM Scope Out: 9:47:45 AM Scope Withdrawal Time: 0 hours 12 minutes 39 seconds  Total Procedure Duration: 0 hours 15 minutes 14 seconds  Findings:                 The perianal and digital rectal examinations were                            normal.                           A 6 mm polyp was found in the transverse colon. The                            polyp was sessile. The polyp was removed with a                            cold snare. Resection and retrieval were complete.                           A 10 mm polyp was found in the sigmoid colon. The                            polyp was semi-pedunculated. The polyp was removed  with a hot snare. Resection and retrieval were                            complete.                           Two small localized angiodysplastic lesions without                            bleeding were found in the cecum.                           Internal hemorrhoids were found during                            retroflexion. The hemorrhoids were small and Grade                            I (internal hemorrhoids that do not prolapse).                           The exam was otherwise without abnormality on                            direct and retroflexion views. Complications:            No immediate complications. Estimated blood loss:                            None. Estimated Blood Loss:     Estimated blood loss: none. Impression:               - One 6 mm polyp in the transverse colon, removed                            with a cold snare. Resected and  retrieved.                           - One 10 mm polyp in the sigmoid colon, removed                            with a hot snare. Resected and retrieved.                           - Two small angiodysplasic lesions in the cecum.                           - Internal hemorrhoids.                           - The examination was otherwise normal on direct                            and retroflexion views. Recommendation:           - Repeat colonoscopy in 3 - 5 years for  surveillance pending pathology review.                           - Patient has a contact number available for                            emergencies. The signs and symptoms of potential                            delayed complications were discussed with the                            patient. Return to normal activities tomorrow.                            Written discharge instructions were provided to the                            patient.                           - Resume previous diet.                           - Continue present medications.                           - Await pathology results.                           - No aspirin, ibuprofen, naproxen, or other                            non-steroidal anti-inflammatory drugs for 2 weeks                            after polyp removal. [name], MD 02/26/2018 9:54:13 AM This report has been signed electronically.

## 2018-02-27 ENCOUNTER — Telehealth: Payer: Self-pay | Admitting: *Deleted

## 2018-02-27 NOTE — Telephone Encounter (Signed)
  Follow up Call-  Call back number 02/26/2018  Post procedure Call Back phone  # [phone]  Permission to leave phone message Yes  Some recent data might be hidden     Patient questions:  Do you have a fever, pain , or abdominal swelling? No. Pain Score  0 *  Have you tolerated food without any problems? Yes.    Have you been able to return to your normal activities? Yes.    Do you have any questions about your discharge instructions: Diet   No. Medications  No. Follow up visit  No.  Do you have questions or concerns about your Care? No.  Actions: * If pain score is 4 or above: No action needed, pain <4.

## 2018-03-08 ENCOUNTER — Encounter: Payer: Self-pay | Admitting: Gastroenterology

## 2018-03-19 ENCOUNTER — Ambulatory Visit (INDEPENDENT_AMBULATORY_CARE_PROVIDER_SITE_OTHER): Payer: BLUE CROSS/BLUE SHIELD | Admitting: Internal Medicine

## 2018-03-19 ENCOUNTER — Encounter: Payer: Self-pay | Admitting: Internal Medicine

## 2018-03-19 VITALS — BP 140/82 | HR 70 | Ht 60.0 in | Wt 138.4 lb

## 2018-03-19 DIAGNOSIS — J45991 Cough variant asthma: Secondary | ICD-10-CM | POA: Diagnosis not present

## 2018-03-19 NOTE — Progress Notes (Signed)
Subjective:    Patient ID: [name], female    DOB: 26-Aug-1966, 52 y.o.   MRN: [id number]    Brief patient profile:  55  yobf  Quit smoking 2012 with onset of variable sob 2010 but nl pfts while on symbicort 02/20/11    History of Present Illness  January 08, 2011 ov cc worse dyspnea, wheezing no cough x 2 weeks no better p inalers, feels something stuck in ssn but swallowing ok. general chest tightness not better with saba.  Patient Instructions:  1) Work on inhaler technique: relax and blow all the way out then take a nice smooth deep breath back in, triggering the inhaler at same time you start breathing in  2) Symbicort 2 puffs first thing in am and 2 puffs again in pm about 12 hours later  3) Change nexium Take one 30-60 min before first meal of the day and add pepcid 20 mg one at bedtime  4) GERD (REFLUX)      02/20/2011 ov / [name]   Cc breathing some better on symbicort but still having episodes of chest tightness lasting form a min to an hour once or twice weekly.  Overall much better vs baseline, no noct symptoms or need for saba, much less sense of something stuck in back of throat, no cough.   rec No change rx> f/u prn        02/12/2018  f/u ov/[name] re: ? Flare of asthma  Not even 50% better p er rx x 2 in Feb 2019  Chief Complaint  Patient presents with  . Pulmonary Consult    Self referral.  She c/o SOB x 3 months "all the time"- with or without any exertion. She states when she lies down she hears "crackling noise".  She also c/o non prod cough- usually worse at night and can be triggered by cleaning products. She has been using proair 3-4 x per day, but none in the past wk.   Dyspnea:  Variably sob with exertion / prior to Nov 04 2017 was fine with good aerobic capacity consistently fast walk up hills off maint rx x years on otc nexium only bedtime and variably bad breath/ hb on this rx  Cough: 30 m to an hour p hs/ not as common later or early am /worse with exp to  clearing products Sleep: most nights ok SABA use:  Way over using now where wasn't using anything prior to Nov 04 2017 and not helping rec Plan A = Automatic = Symbicort 80 Take 2 puffs first thing in am and then another 2 puffs about 12 hours later.  Work on inhaler technique:  Plan B = Backup Only use your albuterol(Proventil/Proair/Ventolin)  as a rescue medication Protonix Take 30- 60 min before your first and last meals of the day  GERD diet   03/19/2018  f/u ov/[name] re:  S/p asthma flare, back in control  Chief Complaint  Patient presents with  . Follow-up    Breathing is much improved and cough has resolved. She has only had to use her albuterol inhaler once since her last visit.    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 Quit 12/12/10. Smoked for approx 10 yrs up to 1/4 ppd  Alcohol use-yes  employed [employer]               Objective:   Physical Exam   amb bf nad   wt 148 > 152 January 08, 2011 > 149  02/20/11 > 02/12/2018  133 > 03/19/2018  138   Vital signs reviewed - Note on arrival 02 sats  95% on RA      No jvd Oropharynx clear Neck supple Lungs clear bilaterally to A and P  RRR no s3 or or sign murmur Abd soft/ non tender/ ok excursion on isp Ext wam with no edema or clubbing noted Neuro  Alert/ No motor deficits                  Assessment & Plan:

## 2018-03-19 NOTE — Patient Instructions (Addendum)
Please schedule a follow up visit in 6  months but call sooner if needed   Continue the reflux diet  Try off protonix and take over the counter  pepcid 20 mg after bfast and supper x 1 week then just after bfast x one week then off pepcid and if can't taper off then let your Primary care doctor know for purposes of refilling the protonix or taking nexium as needed

## 2018-03-22 ENCOUNTER — Encounter: Payer: Self-pay | Admitting: Internal Medicine

## 2018-03-22 NOTE — Assessment & Plan Note (Addendum)
-  PFT's wnl 02/20/11  While on symbicort with improved symptom control > f/u pulm prn - FENO 02/12/2018  =   61 on symb 80 2bid with poor hfa - Spirometry 02/12/2018  Nl   - 02/12/2018    continue symb 80 2bid and add max gerd rx  - Allergy profile 02/12/18  >  Eos 0.4 /  IgE  50  RAST Pos grass > dog  - 03/19/2018  After extensive coaching inhaler device  effectiveness =   90%    All goals of chronic asthma control met including optimal function and elimination of symptoms with minimal need for rescue therapy. ? If really needs gerd rx at this point > ok to taper  Contingencies discussed in full including contacting this office immediately if not controlling the symptoms using the rule of two's.     Each maintenance medication was reviewed in detail including most importantly the difference between maintenance and as needed and under what circumstances the prns are to be used.  Please see AVS for specific  Instructions which are unique to this visit and I personally typed out  which were reviewed in detail in writing with the patient and a copy provided.
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
## 2018-04-24 ENCOUNTER — Ambulatory Visit (INDEPENDENT_AMBULATORY_CARE_PROVIDER_SITE_OTHER): Payer: BLUE CROSS/BLUE SHIELD | Admitting: Internal Medicine

## 2018-04-24 ENCOUNTER — Encounter: Payer: Self-pay | Admitting: Internal Medicine

## 2018-04-24 VITALS — BP 154/97 | HR 69 | Temp 98.0°F | Wt 141.0 lb

## 2018-04-24 DIAGNOSIS — M549 Dorsalgia, unspecified: Secondary | ICD-10-CM | POA: Diagnosis not present

## 2018-04-24 MED ORDER — METHOCARBAMOL 500 MG PO TABS: 500.0000 mg | ORAL_TABLET | Freq: Three times a day (TID) | ORAL | 0 refills | Status: DC | PRN

## 2018-04-24 MED ORDER — IBUPROFEN 800 MG PO TABS: ORAL_TABLET | ORAL | 1 refills | Status: DC

## 2018-04-24 MED ORDER — PREDNISONE 20 MG PO TABS: ORAL_TABLET | ORAL | 0 refills | Status: DC

## 2018-04-24 NOTE — Patient Instructions (Addendum)
Try  Ibuprofen 800 mg  Every 8- 12 hours for  10 days  Muscle relaxant at night  As needed  And if needed  Not helping  Add prednisone   taper   Will do a sports medicine referral  For  Fu care .  Try standing at work etc  Avoid prolonged sittong.  Sciatica Sciatica is pain, numbness, weakness, or tingling along the path of the sciatic nerve. The sciatic nerve starts in the lower back and runs down the back of each leg. The nerve controls the muscles in the lower leg and in the back of the knee. It also provides feeling (sensation) to the back of the thigh, the lower leg, and the sole of the foot. Sciatica is a symptom of another medical condition that pinches or puts pressure on the sciatic nerve. Generally, sciatica only affects one side of the body. Sciatica usually goes away on its own or with treatment. In some cases, sciatica may keep coming back (recur). What are the causes? This condition is caused by pressure on the sciatic nerve, or pinching of the sciatic nerve. This may be the result of:  A disk in between the bones of the spine (vertebrae) bulging out too far (herniated disk).  Age-related changes in the spinal disks (degenerative disk disease).  A pain disorder that affects a muscle in the buttock (piriformis syndrome).  Extra bone growth (bone spur) near the sciatic nerve.  An injury or break (fracture) of the pelvis.  Pregnancy.  Tumor (rare).  What increases the risk? The following factors may make you more likely to develop this condition:  Playing sports that place pressure or stress on the spine, such as football or weight lifting.  Having poor strength and flexibility.  A history of back injury.  A history of back surgery.  Sitting for long periods of time.  Doing activities that involve repetitive bending or lifting.  Obesity.  What are the signs or symptoms? Symptoms can vary from mild to very severe, and they may include:  Any of these problems in  the lower back, leg, hip, or buttock: ? Mild tingling or dull aches. ? Burning sensations. ? Sharp pains.  Numbness in the back of the calf or the sole of the foot.  Leg weakness.  Severe back pain that makes movement difficult.  These symptoms may get worse when you cough, sneeze, or laugh, or when you sit or stand for long periods of time. Being overweight may also make symptoms worse. In some cases, symptoms may recur over time. How is this diagnosed? This condition may be diagnosed based on:  Your symptoms.  A physical exam. Your health care provider may ask you to do certain movements to check whether those movements trigger your symptoms.  You may have tests, including: ? Blood tests. ? X-rays. ? MRI. ? CT scan.  How is this treated? In many cases, this condition improves on its own, without any treatment. However, treatment may include:  Reducing or modifying physical activity during periods of pain.  Exercising and stretching to strengthen your abdomen and improve the flexibility of your spine.  Icing and applying heat to the affected area.  Medicines that help: ? To relieve pain and swelling. ? To relax your muscles.  Injections of medicines that help to relieve pain, irritation, and inflammation around the sciatic nerve (steroids).  Surgery.  Follow these instructions at home: Medicines  Take over-the-counter and prescription medicines only as told by your  health care provider.  Do not drive or operate heavy machinery while taking prescription pain medicine. Managing pain  If directed, apply ice to the affected area. ? Put ice in a plastic bag. ? Place a towel between your skin and the bag. ? Leave the ice on for 20 minutes, 2-3 times a day.  After icing, apply heat to the affected area before you exercise or as often as told by your health care provider. Use the heat source that your health care provider recommends, such as a moist heat pack or a  heating pad. ? Place a towel between your skin and the heat source. ? Leave the heat on for 20-30 minutes. ? Remove the heat if your skin turns bright red. This is especially important if you are unable to feel pain, heat, or cold. You may have a greater risk of getting burned. Activity  Return to your normal activities as told by your health care provider. Ask your health care provider what activities are safe for you. ? Avoid activities that make your symptoms worse.  Take brief periods of rest throughout the day. Resting in a lying or standing position is usually better than sitting to rest. ? When you rest for longer periods, mix in some mild activity or stretching between periods of rest. This will help to prevent stiffness and pain. ? Avoid sitting for long periods of time without moving. Get up and move around at least one time each hour.  Exercise and stretch regularly, as told by your health care provider.  Do not lift anything that is heavier than 10 lb (4.5 kg) while you have symptoms of sciatica. When you do not have symptoms, you should still avoid heavy lifting, especially repetitive heavy lifting.  When you lift objects, always use proper lifting technique, which includes: ? Bending your knees. ? Keeping the load close to your body. ? Avoiding twisting. General instructions  Use good posture. ? Avoid leaning forward while sitting. ? Avoid hunching over while standing.  Maintain a healthy weight. Excess weight puts extra stress on your back and makes it difficult to maintain good posture.  Wear supportive, comfortable shoes. Avoid wearing high heels.  Avoid sleeping on a mattress that is too soft or too hard. A mattress that is firm enough to support your back when you sleep may help to reduce your pain.  Keep all follow-up visits as told by your health care provider. This is important. Contact a health care provider if:  You have pain that wakes you up when you are  sleeping.  You have pain that gets worse when you lie down.  Your pain is worse than you have experienced in the past.  Your pain lasts longer than 4 weeks.  You experience unexplained weight loss. Get help right away if:  You lose control of your bowel or bladder (incontinence).  You have: ? Weakness in your lower back, pelvis, buttocks, or legs that gets worse. ? Redness or swelling of your back. ? A burning sensation when you urinate. This information is not intended to replace advice given to you by your health care provider. Make sure you discuss any questions you have with your health care provider. Document Released: 10/15/2001 Document Revised: 03/26/2016 Document Reviewed: 06/30/2015 Elsevier Interactive Patient Education  [employer].

## 2018-04-27 ENCOUNTER — Telehealth: Payer: Self-pay | Admitting: *Deleted

## 2018-04-27 NOTE — Telephone Encounter (Signed)
Copied from [place] [id number]. Topic: General - Other >> Apr 24, 2018  1:17 PM Lennox Solders wrote: Reason for CRM: pt was seen today and would like a work note for today visit. Pt will return to work on 04-27-18. Please call patient once note is ready for pick up >> Apr 24, 2018  4:39 PM Ahmed Prima L wrote: When the note is ready , can you upload it to her mychart. She wants to return to work Monday at [employer], she said she will just go to work and print that off.

## 2018-04-28 ENCOUNTER — Ambulatory Visit: Payer: BLUE CROSS/BLUE SHIELD | Admitting: Family Medicine

## 2018-05-01 NOTE — Telephone Encounter (Signed)
Pt is requesting a work note from when she was seen last week.  Pt went back to work on Monday 04/27/18.  Pt plans to try and print from Mychart once complete.   Called pt to verify dates missed so that they can be included in the letter.  LM on machine requesting a call back or response to [facility].  mychart message sent as well.

## 2018-05-06 NOTE — Telephone Encounter (Signed)
Per Dr. Regis Bill okay to write note. I spoke with pt and she requesting to mail the work note, it is ready today, sent up front to be mailed. 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

## 2018-05-18 ENCOUNTER — Telehealth: Payer: Self-pay | Admitting: Internal Medicine

## 2018-05-18 NOTE — Telephone Encounter (Signed)
That is fine only for the hfa form - not the powder Unless they have the equivalent in hfa just give her a sample and ov before it runs out with formulary in hand to see me or NP for better choice

## 2018-05-18 NOTE — Telephone Encounter (Signed)
Spoke with a Occupational psychologist at [employer]. Pt's insurance will not cover Symbicort but it covers the generic form of Advair. They are requesting this to be changed.  MW - please advise. Thanks.

## 2018-05-18 NOTE — Telephone Encounter (Signed)
The generic advair does not come in the [facility] form  LMTCB for the pt

## 2018-05-18 NOTE — Progress Notes (Signed)
]  Chief Complaint  Patient presents with  . Annual Exam    HPI: Patient  [name]  52 y.o. comes in today for Preventive Health Care visit  And med amangement    bp doing ok  Had gotten to 138/86 but up sometimes    Asthma gerd relationship   Got better on proitonix but relapsed on pepcid so back on protonix   Health Maintenance  Topic Date Due  . MAMMOGRAM  07/17/2018 (Originally 01/13/2017)  . TETANUS/TDAP  07/17/2018 (Originally 01/13/1986)  . PAP SMEAR  02/12/2021 (Originally 01/14/1988)  . INFLUENZA VACCINE  06/04/2018  . COLONOSCOPY  02/26/2021  . HIV Screening  Completed   Health Maintenance Review LIFESTYLE:  Exercise:   Walks   Tobacco/ETS:n Alcohol: n Sugar beverages:n Sleep:6+ hours Drug use: no HH of 3 pe dogs Work:40  ROS:  GEN/ HEENT: No fever, significant weight changes sweats headaches vision problems hearing changes, CV/ PULM; No chest pain shortness of breath cough, syncope,edema  change in exercise tolerance. GI /GU: No adominal pain, vomiting, change in bowel habits. No blood in the stool. No significant GU symptoms. SKIN/HEME: ,no acute skin rashes suspicious lesions or bleeding. No lymphadenopathy, nodules, masses.  NEURO/ PSYCH:  No neurologic signs such as weakness numbness. No depression anxiety. IMM/ Allergy: No unusual infections.  Allergy .   REST of 12 system review negative except as per HPI   Past Medical History:  Diagnosis Date  . 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   . Allergy   . Anemia   . Asthma   . GERD (gastroesophageal reflux disease)   . Heart murmur   . Hypertension   . Positive TB test   . Shingles   . TOBACCO USE 03/22/2009   Qualifier: Diagnosis of  By: Regis Bill MD, [name]  Stopped Dec 12 2010      Past Surgical History:  Procedure Laterality Date  . ABDOMINAL HYSTERECTOMY    . BREAST SURGERY     cyst from left breast  . BUNIONECTOMY     both feet  . CHOLECYSTECTOMY    . CYSTECTOMY      left shoulder  . KNEE SURGERY Left   . PANENDOSCOPY    . TUBAL LIGATION    . uterine ablastion     and polyps removed  . Uterine polyps      Family History  Problem Relation Age of Onset  . Hypertension Mother   . Alzheimer's disease Mother   . Hypertension Father   . Prostate cancer Father   . HIV Brother   . Lupus Sister   . Colon cancer Paternal Grandmother   . Esophageal cancer Neg Hx   . Liver cancer Neg Hx   . Pancreatic cancer Neg Hx   . Rectal cancer Neg Hx   . Stomach cancer Neg Hx     Social History   Socioeconomic History  . Marital status: Divorced    Spouse name: Not on file  . Number of children: Not on file  . Years of education: Not on file  . Highest education level: Not on file  Occupational History  . Not on file  Social Needs  . Financial resource strain: Not on file  . Food insecurity:    Worry: Not on file    Inability: Not on file  . Transportation needs:    Medical: Not on file    Non-medical: Not on file  Tobacco Use  .  Smoking status: Former Smoker    Packs/day: 0.50    Years: 17.00    Pack years: 8.50    Types: Cigarettes    Last attempt to quit: 12/12/2010    Years since quitting: 7.4  . Smokeless tobacco: Never Used  Substance and Sexual Activity  . Alcohol use: No  . Drug use: No  . Sexual activity: Not on file  Lifestyle  . Physical activity:    Days per week: Not on file    Minutes per session: Not on file  . Stress: Not on file  Relationships  . Social connections:    Talks on phone: Not on file    Gets together: Not on file    Attends religious service: Not on file    Active member of club or organization: Not on file    Attends meetings of clubs or organizations: Not on file    Relationship status: Not on file  Other Topics Concern  . 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 albuterol (PROAIR HFA) 108 (90 Base) MCG/ACT inhaler 2 puffs every 4 hours as needed only  if your can't catch your breath 1 Inhaler 1  . diltiazem (CARDIZEM CD) 180 MG 24 hr capsule Take 1 capsule (180 mg total) by mouth daily. 30 capsule 1  . fluticasone (FLONASE) 50 MCG/ACT nasal spray Place into both nostrils daily.    Marland Kitchen ibuprofen (ADVIL,MOTRIN) 800 MG tablet 1 po very 8-12 hours  For 10 days  Or as needed 40 tablet 1  . methocarbamol (ROBAXIN) 500 MG tablet Take 1-2 tablets (500-1,000 mg total) by mouth every 8 (eight) hours as needed for muscle spasms. 30 tablet 0  . pantoprazole (PROTONIX) 40 MG tablet Take 30- 60 min before your first and last meals of the day 60 tablet 2  . SYMBICORT 80-4.5 MCG/ACT inhaler INHALE 2 PUFFS INTO THE LUNGS 2 (TWO) TIMES DAILY. 10.2 g 3  . OVER THE COUNTER MEDICATION Pro biotic one capsule once daily.    . predniSONE (DELTASONE) 20 MG tablet Take 3,3,3,2,2,2,1,1,1, 1/.2 1./2 1/.2 pills qd (Patient not taking: Reported on 05/19/2018) 24 tablet 0   Facility-Administered Medications Prior to Visit  Medication Dose Route Frequency Provider Last Rate Last Dose  . 0.9 %  sodium chloride infusion  500 mL Intravenous Once Lucio Edward T, MD      . 0.9 %  sodium chloride infusion  500 mL Intravenous Once [name], MD         EXAM:  BP (!) 150/100   Pulse 61   Temp 98.3 F (36.8 C)   Ht 5' (1.524 m)   Wt 139 lb (63 kg)   BMI 27.15 kg/m   Body mass index is 27.15 kg/m. Wt Readings from Last 3 Encounters:  05/19/18 139 lb (63 kg)  04/24/18 141 lb (64 kg)  03/19/18 138 lb 6.4 oz (62.8 kg)    Physical Exam: Vital signs reviewed HGD:JMEQ is a well-developed well-nourished alert cooperative    who appearsr stated age in no acute distress.  HEENT: normocephalic atraumatic , Eyes: PERRL EOM's full, conjunctiva clear, Nares: paten,t no deformity discharge or  tenderness., Ears: no deformity EAC's clear TMs with normal landmarks. Mouth:  clear OP, no lesions, edema.  Moist mucous membranes. Dentition in adequate repair. NECK: supple without masses, thyromegaly or bruits. CHEST/PULM:  Clear to auscultation and percussion breath sounds equal no wheeze , rales or rhonchi. No chest wall deformities or tenderness. Breast: normal by inspection . No dimpling, discharge, masses, tenderness or discharge . CV: PMI is nondisplaced, S1 S2 no gallops, murmurs, rubs. Peripheral pulses are full without delay.No JVD .  ABDOMEN: Bowel sounds normal nontender  No guard or rebound, no hepato splenomegal no CVA tenderness.  No hernia. Extremtities:  No clubbing cyanosis or edema, no acute joint swelling or redness no focal atrophy NEURO:  Oriented x3, cranial nerves 3-12 appear to be intact, no obvious focal weakness,gait within normal limits no abnormal reflexes or asymmetrical SKIN: No acute rashes normal turgor, color, no bruising or petechiae. PSYCH: Oriented, good eye contact, no obvious depression anxiety, cognition and judgment appear normal. LN: no cervical axillary inguinal adenopathy  Lab Results  Component Value Date   WBC 4.4 02/12/2018   HGB 13.5 02/12/2018   HCT 40.8 02/12/2018   PLT 286.0 02/12/2018   GLUCOSE 74 12/26/2017   CHOL 177 07/24/2011   TRIG 61.0 07/24/2011   HDL 67.10 07/24/2011   LDLCALC 98 07/24/2011   ALT 13 (L) 07/19/2017   AST 20 07/19/2017   NA 140 12/26/2017   K 3.5 12/26/2017   CL 104 12/26/2017   CREATININE 0.81 12/26/2017   BUN 11 12/26/2017   CO2 26 12/26/2017   TSH 0.55 06/02/2013   MICROALBUR 0.7 05/30/2009    BP Readings from Last 3 Encounters:  05/19/18 (!) 150/100  04/24/18 (!) 154/97  03/19/18 140/82      ASSESSMENT AND PLAN:  Discussed the following assessment and plan:  Visit for preventive health examination - Plan: Basic metabolic panel, CBC with Differential/Platelet, Hepatic function panel, Lipid  panel, TSH  Medication management - Plan: Basic metabolic panel, CBC with Differential/Platelet, Hepatic function panel, Lipid panel, TSH  Essential hypertension - Plan: Basic metabolic panel, CBC with Differential/Platelet, Hepatic function panel, Lipid panel, TSH  Gastroesophageal reflux disease, esophagitis presence not specified - Plan: Basic metabolic panel, CBC with Differential/Platelet, Hepatic function panel, Lipid panel, TSH  Cough variant asthma - Plan: Basic metabolic panel, CBC with Differential/Platelet, Hepatic function panel, Lipid panel, TSH Vet   More readings about BP control   Goal  See med list and allergy list may tolerate hctz ( not allergic to sulfa) .  Counseled.   hcm   Hx se of meds   Pulmonary as per  resp issues  meds et.c Is fasting today for labs  Patient Care Team: Burnis Medin, MD as PCP - General Patient Instructions  bp goal is now  120/80   Check bp readings twice a day for 5 days  and send in readings . My chart  Or other   Will make adjustments based on that   And may add on  hctz    12.5 mg per day  To get control.   Get updated tetanus booster  When you are ready  Get a mammogram    Preventive Care 40-64 Years, Female Preventive care refers to lifestyle choices and visits with your health care provider that can promote health and wellness. What does preventive care include?  A yearly physical exam. This is also called an annual well check.  Dental exams once or twice a year.  Routine eye exams. Ask your health care provider how often you  should have your eyes checked.  Personal lifestyle choices, including: ? Daily care of your teeth and gums. ? Regular physical activity. ? Eating a healthy diet. ? Avoiding tobacco and drug use. ? Limiting alcohol use. ? Practicing safe sex. ? Taking low-dose aspirin daily starting at age 73. ? Taking vitamin and mineral supplements as recommended by your health care provider. What happens  during an annual well check? The services and screenings done by your health care provider during your annual well check will depend on your age, overall health, lifestyle risk factors, and family history of disease. Counseling Your health care provider may ask you questions about your:  Alcohol use.  Tobacco use.  Drug use.  Emotional well-being.  Home and relationship well-being.  Sexual activity.  Eating habits.  Work and work Statistician.  Method of birth control.  Menstrual cycle.  Pregnancy history.  Screening You may have the following tests or measurements:  Height, weight, and BMI.  Blood pressure.  Lipid and cholesterol levels. These may be checked every 5 years, or more frequently if you are over 28 years old.  Skin check.  Lung cancer screening. You may have this screening every year starting at age 74 if you have a 30-pack-year history of smoking and currently smoke or have quit within the past 15 years.  Fecal occult blood test (FOBT) of the stool. You may have this test every year starting at age 55.  Flexible sigmoidoscopy or colonoscopy. You may have a sigmoidoscopy every 5 years or a colonoscopy every 10 years starting at age 52.  Hepatitis C blood test.  Hepatitis B blood test.  Sexually transmitted disease (STD) testing.  Diabetes screening. This is done by checking your blood sugar (glucose) after you have not eaten for a while (fasting). You may have this done every 1-3 years.  Mammogram. This may be done every 1-2 years. Talk to your health care provider about when you should start having regular mammograms. This may depend on whether you have a family history of breast cancer.  BRCA-related cancer screening. This may be done if you have a family history of breast, ovarian, tubal, or peritoneal cancers.  Pelvic exam and Pap test. This may be done every 3 years starting at age 52. Starting at age 48, this may be done every 5 years if you  have a Pap test in combination with an HPV test.  Bone density scan. This is done to screen for osteoporosis. You may have this scan if you are at high risk for osteoporosis.  Discuss your test results, treatment options, and if necessary, the need for more tests with your health care provider. Vaccines Your health care provider may recommend certain vaccines, such as:  Influenza vaccine. This is recommended every year.  Tetanus, diphtheria, and acellular pertussis (Tdap, Td) vaccine. You may need a Td booster every 10 years.  Varicella vaccine. You may need this if you have not been vaccinated.  Zoster vaccine. You may need this after age 31.  Measles, mumps, and rubella (MMR) vaccine. You may need at least one dose of MMR if you were born in 1957 or later. You may also need a second dose.  Pneumococcal 13-valent conjugate (PCV13) vaccine. You may need this if you have certain conditions and were not previously vaccinated.  Pneumococcal polysaccharide (PPSV23) vaccine. You may need one or two doses if you smoke cigarettes or if you have certain conditions.  Meningococcal vaccine. You may need this if you  have certain conditions.  Hepatitis A vaccine. You may need this if you have certain conditions or if you travel or work in places where you may be exposed to hepatitis A.  Hepatitis B vaccine. You may need this if you have certain conditions or if you travel or work in places where you may be exposed to hepatitis B.  Haemophilus influenzae type b (Hib) vaccine. You may need this if you have certain conditions.  Talk to your health care provider about which screenings and vaccines you need and how often you need them. This information is not intended to replace advice given to you by your health care provider. Make sure you discuss any questions you have with your health care provider. Document Released: 11/17/2015 Document Revised: 07/10/2016 Document Reviewed: 08/22/2015 Elsevier  Interactive Patient Education  2018 [place]. [name] M.D.

## 2018-05-19 ENCOUNTER — Ambulatory Visit (INDEPENDENT_AMBULATORY_CARE_PROVIDER_SITE_OTHER): Payer: BLUE CROSS/BLUE SHIELD | Admitting: Internal Medicine

## 2018-05-19 ENCOUNTER — Encounter: Payer: Self-pay | Admitting: Internal Medicine

## 2018-05-19 VITALS — BP 150/100 | HR 61 | Temp 98.3°F | Ht 60.0 in | Wt 139.0 lb

## 2018-05-19 DIAGNOSIS — Z79899 Other long term (current) drug therapy: Secondary | ICD-10-CM

## 2018-05-19 DIAGNOSIS — J45991 Cough variant asthma: Secondary | ICD-10-CM | POA: Diagnosis not present

## 2018-05-19 DIAGNOSIS — I10 Essential (primary) hypertension: Secondary | ICD-10-CM

## 2018-05-19 DIAGNOSIS — K219 Gastro-esophageal reflux disease without esophagitis: Secondary | ICD-10-CM | POA: Diagnosis not present

## 2018-05-19 DIAGNOSIS — Z0000 Encounter for general adult medical examination without abnormal findings: Secondary | ICD-10-CM | POA: Diagnosis not present

## 2018-05-19 LAB — LIPID PANEL
Cholesterol: 195 mg/dL (ref 0–200)
HDL: 83.8 mg/dL (ref >39.00)
LDL Cholesterol: 103 mg/dL — ABNORMAL HIGH (ref 0–99)
NonHDL: 111.57
Total CHOL/HDL Ratio: 2
Triglycerides: 41 mg/dL (ref 0.0–149.0)
VLDL: 8.2 mg/dL (ref 0.0–40.0)

## 2018-05-19 LAB — CBC WITH DIFFERENTIAL/PLATELET
Basophils Absolute: 0 10*3/uL (ref 0.0–0.1)
Basophils Relative: 1 % (ref 0.0–3.0)
Eosinophils Absolute: 0.4 10*3/uL (ref 0.0–0.7)
Eosinophils Relative: 8.3 % — ABNORMAL HIGH (ref 0.0–5.0)
HCT: 40.5 % (ref 36.0–46.0)
Hemoglobin: 13.4 g/dL (ref 12.0–15.0)
Lymphocytes Relative: 48.4 % — ABNORMAL HIGH (ref 12.0–46.0)
Lymphs Abs: 2.1 10*3/uL (ref 0.7–4.0)
MCHC: 33 g/dL (ref 30.0–36.0)
MCV: 83.5 fl (ref 78.0–100.0)
Monocytes Absolute: 0.3 10*3/uL (ref 0.1–1.0)
Monocytes Relative: 6.6 % (ref 3.0–12.0)
Neutro Abs: 1.6 10*3/uL (ref 1.4–7.7)
Neutrophils Relative %: 35.7 % — ABNORMAL LOW (ref 43.0–77.0)
Platelets: 355 10*3/uL (ref 150.0–400.0)
RBC: 4.85 Mil/uL (ref 3.87–5.11)
RDW: 15.5 % (ref 11.5–15.5)
WBC: 4.4 10*3/uL (ref 4.0–10.5)

## 2018-05-19 LAB — BASIC METABOLIC PANEL
BUN: 14 mg/dL (ref 6–23)
CO2: 31 mEq/L (ref 19–32)
Calcium: 9.6 mg/dL (ref 8.4–10.5)
Chloride: 104 mEq/L (ref 96–112)
Creatinine, Ser: 0.95 mg/dL (ref 0.40–1.20)
GFR: 79.65 mL/min (ref >60.00)
Glucose, Bld: 84 mg/dL (ref 70–99)
Potassium: 4.2 mEq/L (ref 3.5–5.1)
Sodium: 139 mEq/L (ref 135–145)

## 2018-05-19 LAB — HEPATIC FUNCTION PANEL
ALT: 10 U/L (ref 0–35)
AST: 17 U/L (ref 0–37)
Albumin: 4.6 g/dL (ref 3.5–5.2)
Alkaline Phosphatase: 59 U/L (ref 39–117)
Bilirubin, Direct: 0.1 mg/dL (ref 0.0–0.3)
Total Bilirubin: 0.6 mg/dL (ref 0.2–1.2)
Total Protein: 7.6 g/dL (ref 6.0–8.3)

## 2018-05-19 LAB — TSH: TSH: 0.72 u[IU]/mL (ref 0.35–4.50)

## 2018-05-19 NOTE — Patient Instructions (Addendum)
bp goal is now  120/80   Check bp readings twice a day for 5 days  and send in readings . My chart  Or other   Will make adjustments based on that   And may add on  hctz    12.5 mg per day  To get control.   Get updated tetanus booster  When you are ready  Get a mammogram    Preventive Care 40-64 Years, Female Preventive care refers to lifestyle choices and visits with your health care provider that can promote health and wellness. What does preventive care include?  A yearly physical exam. This is also called an annual well check.  Dental exams once or twice a year.  Routine eye exams. Ask your health care provider how often you should have your eyes checked.  Personal lifestyle choices, including: ? Daily care of your teeth and gums. ? Regular physical activity. ? Eating a healthy diet. ? Avoiding tobacco and drug use. ? Limiting alcohol use. ? Practicing safe sex. ? Taking low-dose aspirin daily starting at age 53. ? Taking vitamin and mineral supplements as recommended by your health care provider. What happens during an annual well check? The services and screenings done by your health care provider during your annual well check will depend on your age, overall health, lifestyle risk factors, and family history of disease. Counseling Your health care provider may ask you questions about your:  Alcohol use.  Tobacco use.  Drug use.  Emotional well-being.  Home and relationship well-being.  Sexual activity.  Eating habits.  Work and work Statistician.  Method of birth control.  Menstrual cycle.  Pregnancy history.  Screening You may have the following tests or measurements:  Height, weight, and BMI.  Blood pressure.  Lipid and cholesterol levels. These may be checked every 5 years, or more frequently if you are over 56 years old.  Skin check.  Lung cancer screening. You may have this screening every year starting at age 7 if you have a  30-pack-year history of smoking and currently smoke or have quit within the past 15 years.  Fecal occult blood test (FOBT) of the stool. You may have this test every year starting at age 40.  Flexible sigmoidoscopy or colonoscopy. You may have a sigmoidoscopy every 5 years or a colonoscopy every 10 years starting at age 73.  Hepatitis C blood test.  Hepatitis B blood test.  Sexually transmitted disease (STD) testing.  Diabetes screening. This is done by checking your blood sugar (glucose) after you have not eaten for a while (fasting). You may have this done every 1-3 years.  Mammogram. This may be done every 1-2 years. Talk to your health care provider about when you should start having regular mammograms. This may depend on whether you have a family history of breast cancer.  BRCA-related cancer screening. This may be done if you have a family history of breast, ovarian, tubal, or peritoneal cancers.  Pelvic exam and Pap test. This may be done every 3 years starting at age 47. Starting at age 48, this may be done every 5 years if you have a Pap test in combination with an HPV test.  Bone density scan. This is done to screen for osteoporosis. You may have this scan if you are at high risk for osteoporosis.  Discuss your test results, treatment options, and if necessary, the need for more tests with your health care provider. Vaccines Your health care provider may recommend certain  vaccines, such as:  Influenza vaccine. This is recommended every year.  Tetanus, diphtheria, and acellular pertussis (Tdap, Td) vaccine. You may need a Td booster every 10 years.  Varicella vaccine. You may need this if you have not been vaccinated.  Zoster vaccine. You may need this after age 27.  Measles, mumps, and rubella (MMR) vaccine. You may need at least one dose of MMR if you were born in 1957 or later. You may also need a second dose.  Pneumococcal 13-valent conjugate (PCV13) vaccine. You may  need this if you have certain conditions and were not previously vaccinated.  Pneumococcal polysaccharide (PPSV23) vaccine. You may need one or two doses if you smoke cigarettes or if you have certain conditions.  Meningococcal vaccine. You may need this if you have certain conditions.  Hepatitis A vaccine. You may need this if you have certain conditions or if you travel or work in places where you may be exposed to hepatitis A.  Hepatitis B vaccine. You may need this if you have certain conditions or if you travel or work in places where you may be exposed to hepatitis B.  Haemophilus influenzae type b (Hib) vaccine. You may need this if you have certain conditions.  Talk to your health care provider about which screenings and vaccines you need and how often you need them. This information is not intended to replace advice given to you by your health care provider. Make sure you discuss any questions you have with your health care provider. Document Released: 11/17/2015 Document Revised: 07/10/2016 Document Reviewed: 08/22/2015 Elsevier Interactive Patient Education  [employer].

## 2018-05-20 MED ORDER — BUDESONIDE-FORMOTEROL FUMARATE 80-4.5 MCG/ACT IN AERO: INHALATION_SPRAY | RESPIRATORY_TRACT | 3 refills | Status: DC

## 2018-05-20 MED FILL — SYMBICORT 80-4.5 MCG INH: 80-4.5 | 30 days supply | Qty: 10 | Fill #0

## 2018-05-20 NOTE — Telephone Encounter (Signed)
Called and spoke with pt regarding symb 80 inhaler Explained to pt that the generic for Advair is power, needs to be hfa. Pt agreed to stay with symb 80 inhaler, pt request new rx sent to [facility]. Sent rx for symb 37 today Pt advised that she is on Protonix again from PCP; Pepcid did not work for her at this time. Nothing further needed.

## 2018-05-22 ENCOUNTER — Ambulatory Visit: Payer: Self-pay | Admitting: *Deleted

## 2018-05-22 ENCOUNTER — Telehealth: Payer: Self-pay | Admitting: Internal Medicine

## 2018-05-22 DIAGNOSIS — R062 Wheezing: Secondary | ICD-10-CM | POA: Diagnosis not present

## 2018-05-22 DIAGNOSIS — R0602 Shortness of breath: Secondary | ICD-10-CM | POA: Diagnosis not present

## 2018-05-22 DIAGNOSIS — J80 Acute respiratory distress syndrome: Secondary | ICD-10-CM | POA: Diagnosis not present

## 2018-05-22 DIAGNOSIS — R9431 Abnormal electrocardiogram [ECG] [EKG]: Secondary | ICD-10-CM | POA: Diagnosis not present

## 2018-05-22 NOTE — Telephone Encounter (Signed)
Patient is having trouble breathing and she has used her rescue inhaler without twice relief. Patient is having trouble speaking- she is at work- spoke to Art therapist- advised her to call 911.  Reason for Disposition . Asthma attack, severe (e.g., struggling for each breath, unable to speak)  Answer Assessment - Initial Assessment Questions 1. ACUTE COMPLAINT: "What is the main problem?" "Tell me what happened?"     Patient is having trouble breathing 2. AIRWAY: "Is he / she breathing?" (Yes, No, Unknown)     labored 3. BREATHING: "Is there difficulty breathing?" (Yes, No, Unknown)     rescue inhaler not helping 4. CIRCULATION: "Is there any bleeding?" (Yes, No, Unknown)     no 5. CONSCIOUS: "Is he/she awake, alert, and responding to you?" (Yes, No, Unknown)     yes  Protocols used: 911 [facility]

## 2018-05-22 NOTE — Telephone Encounter (Signed)
Called pt EMS from work with symptoms.  EMS came out and gave two breathing treatments - feeling a little better since having nebulizer. Pt denies SOB at this time just feeling jittery from Albuterol neb.  EMS advised patient no need to go to ED as her symptoms resolved.  Pt called Dr Melvyn Novas office and is scheduled for OV on Tuesday 05/26/18  Pt is now home and resting, states she feels much better.  Pt advised by Pulmonary to contact PCP office.   Pt does not feel she needs to be seen today just wants Dr Regis Bill to know that she was treated.  Nothing further needed.

## 2018-05-22 NOTE — Telephone Encounter (Signed)
Noted   Agree with pulmonary follow up.

## 2018-05-26 ENCOUNTER — Other Ambulatory Visit: Payer: Self-pay | Admitting: Internal Medicine

## 2018-05-26 ENCOUNTER — Ambulatory Visit (INDEPENDENT_AMBULATORY_CARE_PROVIDER_SITE_OTHER): Payer: BLUE CROSS/BLUE SHIELD | Admitting: Internal Medicine

## 2018-05-26 ENCOUNTER — Telehealth: Payer: Self-pay | Admitting: Internal Medicine

## 2018-05-26 ENCOUNTER — Encounter: Payer: Self-pay | Admitting: Internal Medicine

## 2018-05-26 ENCOUNTER — Ambulatory Visit (INDEPENDENT_AMBULATORY_CARE_PROVIDER_SITE_OTHER)
Admission: RE | Admit: 2018-05-26 | Discharge: 2018-05-26 | Disposition: A | Discharge status: Home / Self Care | Payer: BLUE CROSS/BLUE SHIELD | Source: Ambulatory Visit | Attending: Internal Medicine | Admitting: Internal Medicine

## 2018-05-26 VITALS — BP 156/94 | HR 67 | Ht 60.0 in | Wt 143.0 lb

## 2018-05-26 DIAGNOSIS — I10 Essential (primary) hypertension: Secondary | ICD-10-CM

## 2018-05-26 DIAGNOSIS — J45991 Cough variant asthma: Secondary | ICD-10-CM

## 2018-05-26 DIAGNOSIS — J45909 Unspecified asthma, uncomplicated: Secondary | ICD-10-CM | POA: Diagnosis not present

## 2018-05-26 LAB — NITRIC OXIDE: Nitric Oxide: 25

## 2018-05-26 MED ORDER — TRAMADOL HCL 50 MG PO TABS: 50.0000 mg | ORAL_TABLET | ORAL | 0 refills | Status: AC | PRN

## 2018-05-26 MED ORDER — DILTIAZEM HCL ER COATED BEADS 240 MG PO TB24: 240.0000 mg | ORAL_TABLET | Freq: Every day | ORAL | 2 refills | Status: DC

## 2018-05-26 MED ORDER — HYDROCHLOROTHIAZIDE 25 MG PO TABS: 25.0000 mg | ORAL_TABLET | Freq: Every day | ORAL | 2 refills | Status: DC

## 2018-05-26 MED FILL — traMADol HCL 50 MG TABS: 50 | 5 days supply | Qty: 30 | Fill #0

## 2018-05-26 NOTE — Telephone Encounter (Signed)
Opened in error

## 2018-05-26 NOTE — Telephone Encounter (Signed)
Error

## 2018-05-26 NOTE — Patient Instructions (Addendum)
Plan A = Automatic = Symbicort 80 Take 2 puffs first thing in am and then another 2 puffs about 12 hours later.  Continue protonix 40 mg Take 30- 60 min before your first and last meals of the day   Plan B = Backup Only use your albuterol as a rescue medication to be used if you can't catch your breath by resting or doing a relaxed purse lip breathing pattern.  - The less you use it, the better it will work when you need it. - Ok to use the inhaler up to 2 puffs  every 4 hours if you must but call for appointment if use goes up over your usual need - Don't leave home without it !!  (think of it like the spare tire for your car)    Take delsym two tsp every 12 hours and supplement if needed with  tramadol 50 mg up to 2 every 4 hours to suppress the urge to cough. Swallowing water and/or using ice chips/non mint and menthol containing candies (such as lifesavers or sugarless jolly ranchers) are also effective.  You should rest your voice and avoid activities that you know make you cough.  Once you have eliminated the cough for 3 straight days try reducing the tramadol first,  then the delsym as tolerated.     For drainage / throat tickle try take CHLORPHENIRAMINE  4 mg - take one every 4 hours as needed - available over the counter- may cause drowsiness so start with just a bedtime dose or two and see how you tolerate it before trying in daytime     Please remember to go to the  x-ray department downstairs in the basement  for your tests - we will call you with the results when they are available.    Please schedule a follow up office visit in 4 weeks, call sooner if needed with all medications /inhalers/ solutions in hand so we can verify exactly what you are taking. This includes all medications from all doctors and over the [place] separate them into two bags:  the ones you take automatically, no matter what, vs the ones you take just when you feel you need them "BAG #2 is UP TO YOU"   - this will really help [place] help you take your medications more effectively.

## 2018-05-26 NOTE — Progress Notes (Signed)
Spoke with pt and notified of results per Dr. Wert. Pt verbalized understanding and denied any questions. 

## 2018-05-26 NOTE — Progress Notes (Signed)
Subjective:    Patient ID: [name], female    DOB: 1966-07-31, 52 y.o.   MRN: [id number]    Brief patient profile:  73  yobf  Quit smoking 2012 with onset of variable sob 2010 but nl pfts while on symbicort 02/20/11    History of Present Illness  January 08, 2011 ov cc worse dyspnea, wheezing no cough x 2 weeks no better p inalers, feels something stuck in ssn but swallowing ok. general chest tightness not better with saba.  Patient Instructions:  1) Work on inhaler technique: relax and blow all the way out then take a nice smooth deep breath back in, triggering the inhaler at same time you start breathing in  2) Symbicort 2 puffs first thing in am and 2 puffs again in pm about 12 hours later  3) Change nexium Take one 30-60 min before first meal of the day and add pepcid 20 mg one at bedtime  4) GERD (REFLUX)      02/20/2011 ov / [name]   Cc breathing some better on symbicort but still having episodes of chest tightness lasting form a min to an hour once or twice weekly.  Overall much better vs baseline, no noct symptoms or need for saba, much less sense of something stuck in back of throat, no cough.   rec No change rx> f/u prn        02/12/2018  f/u ov/[name] re: ? Flare of asthma  Not even 50% better p er rx x 2 in Feb 2019  Chief Complaint  Patient presents with  . Pulmonary Consult    Self referral.  She c/o SOB x 3 months "all the time"- with or without any exertion. She states when she lies down she hears "crackling noise".  She also c/o non prod cough- usually worse at night and can be triggered by cleaning products. She has been using proair 3-4 x per day, but none in the past wk.   Dyspnea:  Variably sob with exertion / prior to Nov 04 2017 was fine with good aerobic capacity consistently fast walk up hills off maint rx x years on otc nexium only bedtime and variably bad breath/ hb on this rx  Cough: 30 m to an hour p hs/ not as common later or early am /worse with exp to  clearing products Sleep: most nights ok SABA use:  Way over using now where wasn't using anything prior to Nov 04 2017 and not helping rec Plan A = Automatic = Symbicort 80 Take 2 puffs first thing in am and then another 2 puffs about 12 hours later.  Work on inhaler technique:  Plan B = Backup Only use your albuterol(Proventil/Proair/Ventolin)  as a rescue medication Protonix Take 30- 60 min before your first and last meals of the day  GERD diet   03/19/2018  f/u ov/[name] re:  S/p asthma flare, back in control  Chief Complaint  Patient presents with  . Follow-up    Breathing is much improved and cough has resolved. She has only had to use her albuterol inhaler once since her last visit.    Dyspnea:  Ok including steps  Cough: no Sleep: ok  SABA use:  Only x one  rec Please schedule a follow up visit in 6  months but call sooner if needed  Continue the reflux diet Try off protonix and take over the counter  pepcid 20 mg after bfast and supper x 1 week then just  after bfast x one week then off pepcid and if can't taper off then let your Primary care doctor know for purposes of refilling the protonix or taking nexium as needed    05/26/2018 acute extended ov/[name] re: cough cosb  Chief Complaint  Patient presents with  . Acute Visit    asthma flare, cough, SOB, had to call the ambulance to her job for an asthma attack , ran out of symbicort which may have caused asthma flare, just started protonix again  tapered off the protonix and maintained on pepcid but started cough/ rattling breathing w/in a week of the change and doe x steps despite maint on symb 80 2bid so refilled ppi bid ac as of 05/18/18 and yet no improvement initially/ some better on day of ov until starts dry  coughing fits  Had improved to not needing any albuterol prior to above flare  Since onset of flare having trouble lying flat so props up on 5-6 pillows with freq noct awakening only some better p saba.   No obvious  day to day or daytime variability or assoc excess/ purulent sputum or mucus plugs or hemoptysis or cp or chest tightness, subjective wheeze or overt sinus or hb symptoms.     Also denies any obvious fluctuation of symptoms with weather or environmental changes or other aggravating or alleviating factors except as outlined above   No unusual exposure hx or h/o childhood pna/ asthma or knowledge of premature birth.  Current Allergies, Complete Past Medical History, Past Surgical History, Family History, and Social History were reviewed in [employer] [employer] record.  ROS  The following are not active complaints unless bolded Hoarseness, sore throat, dysphagia, dental problems, itching, sneezing,  nasal congestion or discharge of excess mucus or purulent secretions, ear ache,   fever, chills, sweats, unintended wt loss or wt gain, classically pleuritic or exertional cp,  orthopnea pnd or arm/hand swelling  or leg swelling, presyncope, palpitations, abdominal pain, anorexia, nausea, vomiting, diarrhea  or change in bowel habits or change in bladder habits, change in stools or change in urine, dysuria, hematuria,  rash, arthralgias, visual complaints, headache, numbness, weakness or ataxia or problems with walking or coordination,  change in mood or  memory.        Current Meds  Medication Sig  . albuterol (PROAIR HFA) 108 (90 Base) MCG/ACT inhaler 2 puffs every 4 hours as needed only  if your can't catch your breath  . budesonide-formoterol (SYMBICORT) 80-4.5 MCG/ACT inhaler INHALE 2 PUFFS INTO THE LUNGS 2 (TWO) TIMES DAILY.  Marland Kitchen diltiazem (CARDIZEM CD) 180 MG 24 hr capsule Take 1 capsule (180 mg total) by mouth daily.  . fluticasone (FLONASE) 50 MCG/ACT nasal spray Place into both nostrils daily.  Marland Kitchen ibuprofen (ADVIL,MOTRIN) 800 MG tablet 1 po very 8-12 hours  For 10 days  Or as needed  . methocarbamol (ROBAXIN) 500 MG tablet Take 1-2 tablets (500-1,000 mg total) by mouth every 8 (eight)  hours as needed for muscle spasms.  . 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 Quit 12/12/10. Smoked for approx 10 yrs up to 1/4 ppd  Alcohol use-yes  employed [employer]               Objective:   Physical Exam   amb bf with vigorous throat clearing evolved into coughing fit    wt   152 January 08, 2011 > 149  02/20/11 > 02/12/2018  133 > 03/19/2018  138 > 05/26/2018   143    Vital signs reviewed - Note on arrival 02 sats  100% on RA    And  BP  156/94        HEENT: nl dentition, turbinates bilaterally, and oropharynx. Nl external ear canals without cough reflex   NECK :  without JVD/Nodes/TM/ nl carotid upstrokes bilaterally   LUNGS: no acc muscle use,  Nl contour chest which is clear to A and P bilaterally without cough on insp or exp maneuvers   CV:  RRR  no s3 or murmur or increase in P2, and no edema   ABD:  soft and nontender with nl inspiratory excursion in the supine position. No bruits or organomegaly appreciated, bowel sounds nl  MS:  Nl gait/ ext warm without deformities, calf tenderness, cyanosis or clubbing No obvious joint restrictions   SKIN: warm and dry without lesions    NEURO:  alert, approp, nl sensorium with  no motor or cerebellar deficits apparent.     CXR PA and Lateral:   05/26/2018 :    I personally reviewed images and agree with radiology impression as follows:    Mild cardiomegaly.  No active lung disease.      Labs  reviewed:      Chemistry      Component Value Date/Time   NA 139  05/19/2018 1129   K 4.2 05/19/2018 1129   CL 104 05/19/2018 1129   CO2 31 05/19/2018 1129   BUN 14 05/19/2018 1129   CREATININE 0.95 05/19/2018 1129      Component Value Date/Time   CALCIUM 9.6 05/19/2018 1129   ALKPHOS 59 05/19/2018 1129   AST 17 05/19/2018 1129   ALT 10 05/19/2018 1129   BILITOT 0.6 05/19/2018 1129        Lab Results  Component Value Date   WBC 4.4 05/19/2018   HGB 13.4 05/19/2018   HCT 40.5 05/19/2018   MCV 83.5 05/19/2018   PLT 355.0 05/19/2018        EOS                                                              0.4                                     05/19/18       Lab Results  Component Value Date   TSH 0.72 05/19/2018               Assessment & Plan:

## 2018-05-27 ENCOUNTER — Ambulatory Visit: Payer: BLUE CROSS/BLUE SHIELD | Admitting: Family Medicine

## 2018-05-27 ENCOUNTER — Encounter: Payer: Self-pay | Admitting: Internal Medicine

## 2018-05-27 NOTE — Telephone Encounter (Signed)
Will send to Dr [name] as FYI 

## 2018-05-27 NOTE — Assessment & Plan Note (Signed)
Not optimally controlled on present regimen. I reviewed this with the patient and rec increase cardizem to 240 and add hctz 25 mg daily and f/u with PCP as may have component of diastolic dysfunction driving some of her resp symptoms, esp orthopnea/pnd newly reported.

## 2018-05-27 NOTE — Assessment & Plan Note (Signed)
- PFT's wnl 02/20/11  While on symbicort with improved symptom control > f/u pulm prn - FENO 02/12/2018  =   61 on symb 80 2bid with poor hfa - Spirometry 02/12/2018  Nl   - 02/12/2018    continue symb 80 2bid and add max gerd rx  - Allergy profile 02/12/18  >  Eos 0.4 /  IgE  50  RAST Pos grass > dog     - 03/19/2018 try to taper off gerd rx > flared 05/2018 so resumed  - FENO 05/26/2018  =   25 on symb 80 2bid  - 05/26/2018  After extensive coaching inhaler device  effectiveness =    75%    DDX of  difficult airways management almost all start with A and  include Adherence, Ace Inhibitors, Acid Reflux, Active Sinus Disease, Alpha 1 Antitripsin deficiency, Anxiety masquerading as Airways dz,  ABPA,  Allergy(esp in young), Aspiration (esp in elderly), Adverse effects of meds,  Active smokers, A bunch of PE's (a small clot burden can't cause this syndrome unless there is already severe underlying pulm or vascular dz with poor reserve) plus two Bs  = Bronchiectasis and Beta blocker use..and one C= CHF  Adherence is always the initial "prime suspect" and is a multilayered concern that requires a "trust but verify" approach in every patient - starting with knowing how to use medications, especially inhalers, correctly, keeping up with refills and understanding the fundamental difference between maintenance and prns vs those medications only taken for a very short course and then stopped and not refilled.  - see hfa teaching  - return with all meds in hand using a trust but verify approach to confirm accurate Medication  Reconciliation The principal here is that until we are certain that the  patients are doing what we've asked, it makes no sense to ask them to do more.    ? Acid (or non-acid) GERD > always difficult to exclude as up to 75% of pts in some series report no assoc GI/ Heartburn symptoms and she clearly flared off gerd rx > rec continue max (24h)  acid suppression and diet restrictions/ reviewed  and instructions given in writing.  - Of the three most common causes of  Sub-acute / recurrent or chronic cough, only one (GERD)  can actually contribute to/ trigger  the other two (asthma and post nasal drip syndrome)  and perpetuate the cylce of cough.  While not intuitively obvious, many patients with chronic low grade reflux do not cough until there is a primary insult that disturbs the protective epithelial barrier and exposes sensitive nerve endings.   This is typically viral but can due to PNDS and  either may apply here.   The point is that once this occurs, it is difficult to eliminate the cycle  using anything but a maximally effective acid suppression regimen at least in the short run, accompanied by an appropriate diet to address non acid GERD and control / eliminate the cough itself for at least 3 day with tramadol   ? Allergy> no obvious triggers so ok to continue low dose symb and hold further systemic steroids  In favor of low dose ICS/ flonase  ? Anxiety/vcd > usually at the bottom of this list of usual suspects but should be much higher on this pt's based on H and P and  may interfere with adherence and also interpretation of response or lack thereof to symptom management which can be quite subjective.   ?  Chf/ cardiac asthma suggested by h/o orthopnea /pnd (? Cardiac asthma) and cxr showing cm > see hbp    I had an extended discussion with the patient reviewing all relevant studies completed to date and  lasting 25 minutes of a 40  minute acute office visit addressing  severe non-specific but potentially very serious refractory respiratory symptoms of uncertain and potentially multiple  etiologies.   Each maintenance medication was reviewed in detail including most importantly the difference between maintenance and prns and under what circumstances the prns are to be triggered using an action plan format that is not reflected in the computer generated alphabetically organized  AVS.    Please see AVS for specific instructions unique to this office visit that I personally wrote and verbalized to the the pt in detail and then reviewed with pt  by my nurse highlighting any changes in therapy/plan of care  recommended at today's visit.

## 2018-05-28 NOTE — Telephone Encounter (Signed)
Ok to rx   diovan  160 mg one per day disp 30 refill x 1   And  Close observation for any reaction .    ROV in 2 months or as needed .

## 2018-05-28 NOTE — Telephone Encounter (Signed)
Pt notified of recommendations per Dr Regis Bill Waiting on okay from patient and pharmacy clarification.  Rx is pending.

## 2018-05-30 ENCOUNTER — Encounter: Payer: Self-pay | Admitting: Internal Medicine

## 2018-06-01 ENCOUNTER — Encounter: Payer: Self-pay | Admitting: Internal Medicine

## 2018-06-01 DIAGNOSIS — I10 Essential (primary) hypertension: Secondary | ICD-10-CM

## 2018-06-01 DIAGNOSIS — I517 Cardiomegaly: Secondary | ICD-10-CM

## 2018-06-01 NOTE — Telephone Encounter (Signed)
Patient is aware and  Requested to try the plain diovan   we dont think it was the diovan plain that caused the reaction  Poss the hctz so  Ok  to send in as a trial medication  Also please order and echocardiogram   Dx  hypertension and  Enlarged heart on x ray. Then   Follow up  After on med aand had echo for results

## 2018-06-01 NOTE — Telephone Encounter (Signed)
There is a noted allergy on file:  Diovan Hct [Valsartan-hydrochlorothiazide]hydrochlorothiazide]  Swelling High Allergy 12/25/2012  Past Updates...  Had facial swelling and throat itching after 2 doses of the generic had done well on the brand medicine for a while. See ED visit February 2014     Please advise Dr Regis Bill, thanks.

## 2018-06-01 NOTE — Telephone Encounter (Signed)
Please advise Dr Regis Bill, thanks.  See previous email as well -- pt had previous allergic reaction to similar medication of Diovan

## 2018-06-01 NOTE — Telephone Encounter (Signed)
See other messages

## 2018-06-02 ENCOUNTER — Other Ambulatory Visit: Payer: Self-pay | Admitting: Internal Medicine

## 2018-06-02 MED ORDER — VALSARTAN 160 MG PO TABS: 160.0000 mg | ORAL_TABLET | Freq: Every day | ORAL | 1 refills | Status: DC

## 2018-06-02 NOTE — Telephone Encounter (Signed)
Pt would like to see if the diovan can be sent to [place]? CB#: [phone]

## 2018-06-03 ENCOUNTER — Other Ambulatory Visit: Payer: Self-pay | Admitting: Family Medicine

## 2018-06-03 NOTE — Telephone Encounter (Signed)
See patient email message -- Rx sent to pharmacy

## 2018-06-03 NOTE — Telephone Encounter (Signed)
Pt following up on this med as it needs a prior auth for name brand.  Pt states she had a reaction on the generic. Needs to go back on name brand  diltiazem diltiazem (CARDIZEM LA) 240 MG 24 hr tablet

## 2018-06-03 NOTE — Telephone Encounter (Signed)
Please advise [name]

## 2018-06-03 NOTE — Telephone Encounter (Signed)
Order placed for Echo Patient aware that Rx sent 06/02/18 Nothing further needed.

## 2018-06-04 ENCOUNTER — Telehealth: Payer: Self-pay | Admitting: *Deleted

## 2018-06-04 MED FILL — ACETAMINOPHEN/COD #3 TABLET: 300-30 | 3 days supply | Qty: 12 | Fill #0

## 2018-06-04 NOTE — Telephone Encounter (Signed)
Prior auth for Cardizem LA 240mg  sent to [email or web address]-key [id number].

## 2018-06-04 NOTE — Telephone Encounter (Signed)
Prior auth for Diovan 160mg  sent to [email or web address]-key [id number].

## 2018-06-08 NOTE — Telephone Encounter (Signed)
Fax received from [facility] stating the request was denied and this was given to Dr Velora Mediate asst.

## 2018-06-08 NOTE — Telephone Encounter (Signed)
Placed in red folder for review

## 2018-06-08 NOTE — Telephone Encounter (Signed)
Fax received from [facility] stating the request was denied and this was given to Dr Velora Mediate asst.

## 2018-06-09 NOTE — Telephone Encounter (Signed)
Denial letter placed in red folder for Dr Regis Bill to review.

## 2018-06-12 ENCOUNTER — Encounter (HOSPITAL_COMMUNITY): Payer: Self-pay

## 2018-06-12 ENCOUNTER — Ambulatory Visit (HOSPITAL_COMMUNITY): Payer: BLUE CROSS/BLUE SHIELD | Attending: Internal Medicine

## 2018-06-12 ENCOUNTER — Other Ambulatory Visit: Payer: Self-pay

## 2018-06-12 DIAGNOSIS — I517 Cardiomegaly: Secondary | ICD-10-CM | POA: Diagnosis not present

## 2018-06-12 DIAGNOSIS — I119 Hypertensive heart disease without heart failure: Secondary | ICD-10-CM | POA: Diagnosis not present

## 2018-06-12 DIAGNOSIS — I10 Essential (primary) hypertension: Secondary | ICD-10-CM | POA: Diagnosis not present

## 2018-06-12 NOTE — Telephone Encounter (Signed)
II dont have a form for  adverse effect .   and this wasn't reported at the time in 2014. And was uncertain  Which part of the  Medicine   Caused the reaction   The best I can do is write a letter from record review    From 2014   At the time she was on diovan hctz   It appears thea dr wert gave her hctz to try a few weeks ago  For bp   Please advise  About   How doing    Although  Ok to try no guarnatee that this will not cause a reaction   I suggest   pay out of pocket for a test dose   Of medicine  2-3  pills     Before proceeding    And then we can try to see if insurance will  Cover     I dont know the cost  But probably about 7$ per pill

## 2018-06-12 NOTE — Telephone Encounter (Signed)
Denial letter is still in PCP's red folder.  States that request for brand name meds can only be approved when an FDA MedWatch Adverse Event Reporting Form has been submitted on the pt's behalf.

## 2018-06-12 NOTE — Telephone Encounter (Signed)
Pt calling in to check on the status of the prior authorization for her Diovan.  Pt wants to know how long this process takes.

## 2018-06-12 NOTE — Telephone Encounter (Signed)
Pt notified of results/instructions and verbalized understanding.  She would like clarification on if she is to continue taking the Diovan w/ the Cardizem, or if she should stop Cardizem. This is what she requested to do when she sent MyChart message on 06/01/18, but I don't see clear documentation stating for her to stop Cardizem, and it is still on her med list. Can you please clarify? Thanks!

## 2018-06-15 NOTE — Telephone Encounter (Signed)
Please advise Dr [name], thanks.   

## 2018-06-15 NOTE — Telephone Encounter (Signed)
I am not aware of     reacting to this medication  It was a ? Of the diovan hctz.... Other version of LA diltiazem should be ok   If   Ling acting and  dont know why need PA  It appears dr Melvyn Novas wrote rx for the [place]   In July .   Please advise  Other versions that are covered by her insurance .

## 2018-06-15 NOTE — Telephone Encounter (Signed)
ATC x 3 Line will not ring and keeps going to fast busy signal. [facility]

## 2018-06-15 NOTE — Telephone Encounter (Signed)
Yes stay on the  cardizem at this time

## 2018-06-16 ENCOUNTER — Encounter: Payer: Self-pay | Admitting: *Deleted

## 2018-06-16 ENCOUNTER — Other Ambulatory Visit: Payer: Self-pay | Admitting: Internal Medicine

## 2018-06-16 DIAGNOSIS — J45991 Cough variant asthma: Secondary | ICD-10-CM

## 2018-06-16 MED FILL — VALSARTAN 160 MG TABLET: 160 | 30 days supply | Qty: 30 | Fill #0

## 2018-06-16 NOTE — Telephone Encounter (Signed)
Prior auth was submitted-see previous note.

## 2018-06-17 NOTE — Telephone Encounter (Signed)
Spoke with patient, per Dr Regis Bill she said to continue the Diovan and the Cardizem daily.  This has been relayed. Nothing further needed.

## 2018-06-18 ENCOUNTER — Telehealth: Payer: Self-pay | Admitting: Internal Medicine

## 2018-06-18 NOTE — Telephone Encounter (Signed)
Please see attached request for EpiPen

## 2018-06-18 NOTE — Telephone Encounter (Signed)
Copied from [facility] [id number]. Topic: Quick Communication - See Telephone Encounter >> Jun 18, 2018  2:06 PM Ivar Drape wrote: CRM for notification. See Telephone encounter for: 06/18/18. Patient stated her Epi Pen has expired and she will need a prescription for one and have it sent to her preferred pharmacy [facility] in [place].

## 2018-06-19 ENCOUNTER — Telehealth: Payer: Self-pay | Admitting: Family Medicine

## 2018-06-19 MED ORDER — EPINEPHRINE 0.15 MG/0.15ML IJ SOAJ: 0.1500 mg | INTRAMUSCULAR | 1 refills | Status: DC | PRN

## 2018-06-19 NOTE — Telephone Encounter (Signed)
Dr. Regis Bill out of office please advise

## 2018-06-19 NOTE — Telephone Encounter (Signed)
Dr. Regis Bill, please advise on refill of the epipen. I did not see this on the patients med list.  Thanks.

## 2018-06-19 NOTE — Telephone Encounter (Signed)
I  havent written for her epi pen   I think her allergist has in  Remote past. But ok to send in  Epi pen   To her pharmacy

## 2018-06-19 NOTE — Telephone Encounter (Signed)
Copied from [place] [id number]. Topic: General - Other >> Jun 19, 2018  2:00 PM Yvette Rack wrote: Reason for CRM: Ann with [facility] wants to verify the Rx for EPINEPHrine 0.15 MG/0.15ML IJ injection. Lelon Frohlich states that this is a Paramedic dose and wants to make sure the Rx is correct or if it should be for the adult 0.30 mg injection.

## 2018-06-19 NOTE — Telephone Encounter (Signed)
Change the EpiPen rx to the 0.3 mg adult dosage. Otherwise the instructions are the same.

## 2018-06-19 NOTE — Telephone Encounter (Signed)
Called and spoke with pt and she is aware that the epipen has been sent to her pharmacy per her request.

## 2018-06-19 NOTE — Telephone Encounter (Signed)
Called and spoke with Lelon Frohlich and verified.

## 2018-06-22 ENCOUNTER — Ambulatory Visit: Payer: Self-pay

## 2018-06-22 NOTE — Telephone Encounter (Signed)
Pt. Reports she is "very active - I walk every day. But this weekend I started having hip pain." Reports both hips hurt. Pain with walking and climbing stairs. Has pain in bed at night. Tried Motrin over the weekend - "but it just made me sleepy."  Denies any numbness or tingling. Denies any back pain. Reports she broke her "tail bone about ten years ago." Requests an appointment for tomorrow. Appointment made. Reason for Disposition . [1] MODERATE pain (e.g., interferes with normal activities, limping) AND [2] present > 3 days  Answer Assessment - Initial Assessment Questions 1. LOCATION and RADIATION: "Where is the pain located?"      Both hip joints 2. QUALITY: "What does the pain feel like?"  (e.g., sharp, dull, aching, burning)     Dull aching pain 3. SEVERITY: "How bad is the pain?" "What does it keep you from doing?"   (Scale 1-10; or mild, moderate, severe)   -  MILD (1-3): doesn't interfere with normal activities    -  MODERATE (4-7): interferes with normal activities (e.g., work or school) or awakens from sleep, limping    -  SEVERE (8-10): excruciating pain, unable to do any normal activities, unable to walk     4-5 4. ONSET: "When did the pain start?" "Does it come and go, or is it there all the time?"     This weekend 5. WORK OR EXERCISE: "Has there been any recent work or exercise that involved this part of the body?"      No 6. CAUSE: "What do you think is causing the hip pain?"      Unsure 7. AGGRAVATING FACTORS: "What makes the hip pain worse?" (e.g., walking, climbing stairs, running)     Walking  8. OTHER SYMPTOMS: "Do you have any other symptoms?" (e.g., back pain, pain shooting down leg,  fever, rash)     No  Protocols used: HIP PAIN-A-AH

## 2018-06-23 ENCOUNTER — Ambulatory Visit (INDEPENDENT_AMBULATORY_CARE_PROVIDER_SITE_OTHER): Payer: BLUE CROSS/BLUE SHIELD | Admitting: Internal Medicine

## 2018-06-23 ENCOUNTER — Ambulatory Visit (INDEPENDENT_AMBULATORY_CARE_PROVIDER_SITE_OTHER): Payer: BLUE CROSS/BLUE SHIELD

## 2018-06-23 ENCOUNTER — Encounter: Payer: Self-pay | Admitting: Internal Medicine

## 2018-06-23 VITALS — BP 106/68 | HR 66 | Temp 98.3°F | Wt 141.2 lb

## 2018-06-23 DIAGNOSIS — M25551 Pain in right hip: Secondary | ICD-10-CM

## 2018-06-23 DIAGNOSIS — Z79899 Other long term (current) drug therapy: Secondary | ICD-10-CM

## 2018-06-23 DIAGNOSIS — M25552 Pain in left hip: Secondary | ICD-10-CM

## 2018-06-23 DIAGNOSIS — I10 Essential (primary) hypertension: Secondary | ICD-10-CM

## 2018-06-23 LAB — POC URINALSYSI DIPSTICK (AUTOMATED)
Bilirubin, UA: NEGATIVE
Blood, UA: NEGATIVE
Glucose, UA: NEGATIVE
Ketones, UA: NEGATIVE
Leukocytes, UA: NEGATIVE
Nitrite, UA: NEGATIVE
Protein, UA: NEGATIVE
Spec Grav, UA: 1.025 (ref 1.010–1.025)
Urobilinogen, UA: 1 E.U./dL
pH, UA: 6 (ref 5.0–8.0)

## 2018-06-23 NOTE — Progress Notes (Signed)
Chief Complaint  Patient presents with  . Hip Pain    bilateral hip pain x 3-4 days. Pt does not recall injuring. Pain radiating down to ankles. Some pain in between shoulder blades. Pt has been taking Motrin 800mg , little relief    HPI: [name] 52 y.o.  New problem  Somewhat acute onset  No injury but hurts with sitting a long time and walking but bilaterally .  Complains of lateral hip to groin pain that radiates down to the knee and to the side at times.  She has some discomfort high up between her shoulder blades but that may be because she is walking funny it is difficult to walk she usually sleeps on her side but cannot to sleep but on her stomach. 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  She has been walking a lot more recently trying to get good exercise and healthy.  But no specific injury Her previous back pain had increased improved and she had to cancel the appointment with sports medicine for personal reasons.  This pain is different than her back pain and she has never had this before.  No fever urinary tract symptoms or change in GI symptoms.  She has been taking the Diovan just started in the diltiazem.  Question could this be from the medicine. No allergic response chest pain or syncope. ROS: See pertinent positives and negatives per HPI.  Past Medical History:  Diagnosis Date  . 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   . Allergy   . Anemia   . Asthma   . GERD (gastroesophageal reflux disease)   . Heart murmur   . Hypertension   . Positive TB test   . Shingles   . TOBACCO USE 03/22/2009   Qualifier: Diagnosis of  By: Regis Bill MD, [name]  Stopped Dec 12 2010      Family History  Problem Relation Age of Onset  . Hypertension Mother   . Alzheimer's disease Mother   . Hypertension Father   . Prostate cancer Father   . HIV Brother   . Lupus Sister   . Colon cancer Paternal  Grandmother   . Esophageal cancer Neg Hx   . Liver cancer Neg Hx   . Pancreatic cancer Neg Hx   . Rectal cancer Neg Hx   . Stomach cancer Neg Hx     Social History   Socioeconomic History  . Marital status: Divorced    Spouse name: Not on file  . Number of children: Not on file  . Years of education: Not on file  . Highest education level: Not on file  Occupational History  . Not on file  Social Needs  . Financial resource strain: Not on file  . Food insecurity:    Worry: Not on file    Inability: Not on file  . Transportation needs:    Medical: Not on file    Non-medical: Not on file  Tobacco Use  . Smoking status: Former Smoker    Packs/day: 0.50    Years: 17.00    Pack years: 8.50    Types: Cigarettes    Last attempt to quit: 12/12/2010    Years since quitting: 7.5  . Smokeless tobacco: Never Used  Substance and Sexual Activity  . Alcohol use: No  . Drug use: No  . Sexual activity: Not on  file  Lifestyle  . Physical activity:    Days per week: Not on file    Minutes per session: Not on file  . Stress: Not on file  Relationships  . Social connections:    Talks on phone: Not on file    Gets together: Not on file    Attends religious service: Not on file    Active member of club or organization: Not on file    Attends meetings of clubs or organizations: Not on file    Relationship status: Not on file  Other Topics Concern  . Not on file  Social History Narrative   Divorced    Tobacco since age 55 stopped feb 2012   Works  [employer]ner, broadcasting/film/video.Patient account REP   Now works [employer]     Outpatient Medications Prior to Visit  Medication Sig Dispense Refill  . albuterol (PROAIR HFA) 108 (90 Base) MCG/ACT inhaler 2 puffs every 4 hours as needed only  if your can't catch your breath 1 Inhaler 1  . budesonide-formoterol (SYMBICORT) 80-4.5 MCG/ACT inhaler INHALE 2 PUFFS INTO THE LUNGS 2 (TWO) TIMES DAILY. 10.2 g 3  . diltiazem (CARDIZEM  LA) 240 MG 24 hr tablet Take 1 tablet (240 mg total) by mouth daily. 30 tablet 2  . EPINEPHrine 0.15 MG/0.15ML IJ injection Inject 0.15 mLs (0.15 mg total) into the muscle as needed for anaphylaxis. 2 Device 1  . fluticasone (FLONASE) 50 MCG/ACT nasal spray Place into both nostrils daily.    . hydrochlorothiazide (HYDRODIURIL) 25 MG tablet Take 1 tablet (25 mg total) by mouth daily. 30 tablet 2  . ibuprofen (ADVIL,MOTRIN) 800 MG tablet 1 po very 8-12 hours  For 10 days  Or as needed 40 tablet 1  . methocarbamol (ROBAXIN) 500 MG tablet Take 1-2 tablets (500-1,000 mg total) by mouth every 8 (eight) hours as needed for muscle spasms. 30 tablet 0  . pantoprazole (PROTONIX) 40 MG tablet TAKE ONE TABLET BY MOUTH 30-60 MINUTES BEFORE YOUR FIRST AND LAST MEALS 60 tablet 2  . valsartan (DIOVAN) 160 MG tablet Take 1 tablet (160 mg total) by mouth daily. 30 tablet 1  . diltiazem (CARDIZEM CD) 180 MG 24 hr capsule Take 1 capsule (180 mg total) by mouth daily. (Patient not taking: Reported on 06/23/2018) 30 capsule 1   No facility-administered medications prior to visit.      EXAM:  BP 106/68 (BP Location: Left Arm, Patient Position: Sitting, Cuff Size: Normal)   Pulse 66   Temp 98.3 F (36.8 C) (Oral)   Wt 141 lb 3.2 oz (64 kg)   BMI 27.58 kg/m   Body mass index is 27.58 kg/m.  GENERAL: vitals reviewed and listed above, alert, oriented, appears well hydrated in mild to moderate discomfort leaning over table. HEENT: atraumatic, conjunctiva  clear, no obvious abnormalities on inspection of external nose and ears  NECK: no obvious masses on inspection palpation  LUNGS: clear to auscultation bilaterally, no wheezes, rales or rhonchi, good air movement CV: HRRR, no clubbing cyanosis or  peripheral edema nl cap refill  MS: moves all extremities without noticeable focal  abnormality has some discomfort lateral hip and groin passive range of motion with minimal pain except for external rotation on the  left gait is definitely antalgic no midline spine tenderness abdomen soft without organomegaly. Neurologic no gross weakness. PSYCH: pleasant and cooperative, no obvious depression or anxiety  ASSESSMENT AND PLAN:  Discussed the following assessment and plan:  Pain  of both hip joints - radiating to anterior knee and lateral  - Plan: POCT Urinalysis Dipstick (Automated), DG HIPS BILAT WITH PELVIS 2V  Medication management  Essential hypertension Uncertain cause area of pain is consistent with hip however aggravating factors is more consistent with back.  I still think it is musculoskeletal no other alarm symptoms.  X-ray of hip shows no acute arthritis or bony changes today. Treat this is musculoskeletal if persistent progressive make an appointment with Dr. Tamala Julian. Consideration of change in desk height. Her blood pressure is very good today continue and plan follow-up. Her echo a couple weeks ago showed some LVH mild.  Discussed this with her good blood pressure control is important. Fortunately she has had no side effects from the Diovan. -Patient advised to return or notify health care team  if symptoms worsen ,persist or new concerns arise. Note for owrk Patient Instructions  Not sure  If this is your hip or back     Atypical but still think this is musulo skeletal.   Muscle relaxant and  Ibuprofen every 8 hours ok .   If  persistent or progressive then  Get appt with dr Tamala Julian  .    Your BP is good today   dont hink sx are from the diltiazem  But if you want to stop for a few days until better can do this and then restart.     [name]. Malani Lees M.D.

## 2018-06-23 NOTE — Patient Instructions (Signed)
Not sure  If this is your hip or back     Atypical but still think this is musulo skeletal.   Muscle relaxant and  Ibuprofen every 8 hours ok .   If  persistent or progressive then  Get appt with dr Tamala Julian  .    Your BP is good today   dont hink sx are from the diltiazem  But if you want to stop for a few days until better can do this and then restart.

## 2018-06-24 NOTE — Telephone Encounter (Signed)
Medication was changed to 240mg  -- this Rx no longer needed.

## 2018-06-25 ENCOUNTER — Ambulatory Visit (INDEPENDENT_AMBULATORY_CARE_PROVIDER_SITE_OTHER): Payer: BLUE CROSS/BLUE SHIELD | Admitting: Adult Health

## 2018-06-25 ENCOUNTER — Encounter: Payer: Self-pay | Admitting: Adult Health

## 2018-06-25 DIAGNOSIS — J45991 Cough variant asthma: Secondary | ICD-10-CM | POA: Diagnosis not present

## 2018-06-25 DIAGNOSIS — K219 Gastro-esophageal reflux disease without esophagitis: Secondary | ICD-10-CM | POA: Diagnosis not present

## 2018-06-25 MED ORDER — BUDESONIDE-FORMOTEROL FUMARATE 80-4.5 MCG/ACT IN AERO: 2.0000 | INHALATION_SPRAY | Freq: Two times a day (BID) | RESPIRATORY_TRACT | 0 refills | Status: DC

## 2018-06-25 NOTE — Addendum Note (Signed)
Addended by: Parke Poisson E on: 06/25/2018 04:42 PM   Modules accepted: Orders

## 2018-06-25 NOTE — Assessment & Plan Note (Signed)
Improved control on Symbicort  Patient's medications were reviewed today and patient education was given. Computerized medication calendar was adjusted/completed    Plan  Patient Instructions  Continue on Symbicort 2 puffs twice daily.,  Brush rinse and gargle after use Follow med calendar closely bring to each visit Follow-up with Dr. Melvyn Novas in 4 months and as needed

## 2018-06-25 NOTE — Patient Instructions (Signed)
Continue on Symbicort 2 puffs twice daily.,  Brush rinse and gargle after use Follow med calendar closely bring to each visit Follow-up with Dr. Melvyn Novas in 4 months and as needed

## 2018-06-25 NOTE — Assessment & Plan Note (Signed)
Improved control on GERD tx .

## 2018-06-25 NOTE — Progress Notes (Signed)
@Patient  ID: [name], female    DOB: [birth year], 52 y.o.   MRN: [id number]  Chief Complaint  Patient presents with  . Follow-up    Cough     Referring provider: Burnis Medin, MD  HPI: 52 year old female former smoker followed for cough variant asthma , reestablish 02/2018 .   TEST  PFT nml 2012  - FENO 02/12/2018  =   61 on symb 80 2bid with poor hfa - Spirometry 02/12/2018  Nl   - 02/12/2018    continue symb 80 2bid and add max gerd rx  - Allergy profile 02/12/18  >  Eos 0.4 /  IgE  50  RAST Pos grass > dog  FENO 05/26/2018  =   25 on symb 80 2bid  2D echo August 2019 EF 60 to 44%, grade 1 diastolic dysfunction   07/24/1006 Follow up : Asthma and Med Review  Patient returns for a one-month follow-up and medication review.  Patient is being treated for asthma.  She been having increased symptoms.  She says that she has improved since starting Symbicort and also treating reflux.  Cough and wheezing have decreased.  She has rare albuterol use.  Gets some shortness of breath intermittently.  We reviewed all her medications organize them into a medication calendar.  Appears she is taking her medications correctly  Patient's blood pressure was elevated last visit.  She was instructed to increase her Cardizem.  Patient's blood pressure is improved how ever she says she was not able to tolerate Cardizem and has been seen by her primary care physician is currently on hold.  Patient also says she has been having some joint pain and fatigue.  Was seen by her primary care physician and started on anti-inflammatories this week.      Allergies  Allergen Reactions  . Diovan Hct [Valsartan-Hydrochlorothiazide] Swelling    Had facial swelling and throat itching after 2 doses of the generic had done well on the brand medicine for a while.  See ED visit February 2014  . Purified Water [Water, Sterile] Anaphylaxis  . Allegra [Fexofenadine] Hives  . Chocolate     triggers Asthma  .  Cimetidine     REACTION: unspecified  . Citric Acid     REACTION: swelling, hives, itching  . Coly-Mycin S     REACTION: unspecified  . Peanut-Containing Drug Products Swelling  . Amlodipine Other (See Comments)    At 5 mg dose     Immunization History  Administered Date(s) Administered  . Influenza Split 08/04/2013  . Influenza Whole 08/04/2009    Past Medical History:  Diagnosis Date  . 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   . Allergy   . Anemia   . Asthma   . GERD (gastroesophageal reflux disease)   . Heart murmur   . Hypertension   . Positive TB test   . Shingles   . TOBACCO USE 03/22/2009   Qualifier: Diagnosis of  By: Regis Bill MD, [name]  Stopped Dec 12 2010      Tobacco History: Social History   Tobacco Use  Smoking Status Former Smoker  . Packs/day: 0.50  . Years: 17.00  . Pack years: 8.50  . Types: Cigarettes  . Last attempt to quit: 12/12/2010  . Years since quitting: 7.5  Smokeless Tobacco Never Used   Counseling given: Not Answered   Outpatient Medications Prior to Visit  Medication Sig Dispense Refill  . albuterol (PROAIR  HFA) 108 (90 Base) MCG/ACT inhaler 2 puffs every 4 hours as needed only  if your can't catch your breath 1 Inhaler 1  . budesonide-formoterol (SYMBICORT) 80-4.5 MCG/ACT inhaler INHALE 2 PUFFS INTO THE LUNGS 2 (TWO) TIMES DAILY. 10.2 g 3  . diltiazem (CARDIZEM LA) 240 MG 24 hr tablet Take 1 tablet (240 mg total) by mouth daily. 30 tablet 2  . EPINEPHrine 0.15 MG/0.15ML IJ injection Inject 0.15 mLs (0.15 mg total) into the muscle as needed for anaphylaxis. 2 Device 1  . fluticasone (FLONASE) 50 MCG/ACT nasal spray Place into both nostrils daily.    . hydrochlorothiazide (HYDRODIURIL) 25 MG tablet Take 1 tablet (25 mg total) by mouth daily. 30 tablet 2  . ibuprofen (ADVIL,MOTRIN) 800 MG tablet 1 po very 8-12 hours  For 10 days  Or as needed 40 tablet 1  . methocarbamol (ROBAXIN) 500 MG tablet Take 1-2 tablets  (500-1,000 mg total) by mouth every 8 (eight) hours as needed for muscle spasms. 30 tablet 0  . pantoprazole (PROTONIX) 40 MG tablet TAKE ONE TABLET BY MOUTH 30-60 MINUTES BEFORE YOUR FIRST AND LAST MEALS 60 tablet 2  . valsartan (DIOVAN) 160 MG tablet Take 1 tablet (160 mg total) by mouth daily. 30 tablet 1   No facility-administered medications prior to visit.      Review of Systems  Constitutional:   No  weight loss, night sweats,  Fevers, chills, +fatigue, or  lassitude.  HEENT:   No headaches,  Difficulty swallowing,  Tooth/dental problems, or  Sore throat,                No sneezing, itching, ear ache, nasal congestion, post nasal drip,   CV:  No chest pain,  Orthopnea, PND, swelling in lower extremities, anasarca, dizziness, palpitations, syncope.   GI  No heartburn, indigestion, abdominal pain, nausea, vomiting, diarrhea, change in bowel habits, loss of appetite, bloody stools.   Resp: No chest wall deformity  Skin: no rash or lesions.  GU: no dysuria, change in color of urine, no urgency or frequency.  No flank pain, no hematuria   MS:  No joint pain or swelling.  No decreased range of motion.  No back pain.    Physical Exam  BP 114/72 (BP Location: Left Arm, Cuff Size: Normal)   Pulse 76   Ht 5' (1.524 m)   Wt 142 lb 12.8 oz (64.8 kg)   SpO2 96%   BMI 27.89 kg/m   GEN: A/Ox3; pleasant , NAD, well nourished   HEENT:  [place]/AT,  EACs-clear, TMs-wnl, NOSE-clear, THROAT-clear, no lesions, no postnasal drip or exudate noted.   NECK:  Supple w/ fair ROM; no JVD; normal carotid impulses w/o bruits; no thyromegaly or nodules palpated; no lymphadenopathy.    RESP  Clear  P & A; w/o, wheezes/ rales/ or rhonchi. no accessory muscle use, no dullness to percussion  CARD:  RRR, no m/r/g, no peripheral edema, pulses intact, no cyanosis or clubbing.  GI:   Soft & nt; nml bowel sounds; no organomegaly or masses detected.   Musco: Warm bil, no deformities or joint swelling  noted.   Neuro: alert, no focal deficits noted.    Skin: Warm, no lesions or rashes    Lab Results:  CBC    Component Value Date/Time   WBC 4.4 05/19/2018 1129   RBC 4.85 05/19/2018 1129   HGB 13.4 05/19/2018 1129   HCT 40.5 05/19/2018 1129   PLT 355.0 05/19/2018 1129   MCV  83.5 05/19/2018 1129   MCH 27.8 12/26/2017 1116   MCHC 33.0 05/19/2018 1129   RDW 15.5 05/19/2018 1129   LYMPHSABS 2.1 05/19/2018 1129   MONOABS 0.3 05/19/2018 1129   EOSABS 0.4 05/19/2018 1129   BASOSABS 0.0 05/19/2018 1129    BMET    Component Value Date/Time   NA 139 05/19/2018 1129   K 4.2 05/19/2018 1129   CL 104 05/19/2018 1129   CO2 31 05/19/2018 1129   GLUCOSE 84 05/19/2018 1129   BUN 14 05/19/2018 1129   CREATININE 0.95 05/19/2018 1129   CALCIUM 9.6 05/19/2018 1129   GFRNONAA >60 12/26/2017 1116   GFRAA >60 12/26/2017 1116    BNP No results found for: BNP  ProBNP No results found for: PROBNP  Imaging:    Assessment & Plan:   Cough variant asthma Improved control on Symbicort  Patient's medications were reviewed today and patient education was given. Computerized medication calendar was adjusted/completed    Plan  Patient Instructions  Continue on Symbicort 2 puffs twice daily.,  Brush rinse and gargle after use Follow med calendar closely bring to each visit Follow-up with Dr. Melvyn Novas in 4 months and as needed     GERD Improved control on GERD tx .      Rexene Edison, NP 06/25/2018

## 2018-06-29 NOTE — Addendum Note (Signed)
Addended by: Parke Poisson E on: 06/29/2018 04:49 PM   Modules accepted: Orders

## 2018-06-29 NOTE — Progress Notes (Signed)
Chart and office note reviewed in detail  > agree with a/p as outlined    

## 2018-07-14 MED FILL — SYMBICORT 80-4.5 MCG INH: 80-4.5 | 30 days supply | Qty: 10 | Fill #1

## 2018-07-17 MED FILL — VALSARTAN 160 MG TABLET: 160 | 30 days supply | Qty: 30 | Fill #1

## 2018-07-29 ENCOUNTER — Emergency Department (HOSPITAL_COMMUNITY)
Admission: EM | Admit: 2018-07-29 | Discharge: 2018-07-29 | Disposition: A | Discharge status: Home / Self Care | Payer: BLUE CROSS/BLUE SHIELD | Attending: Emergency Medicine | Admitting: Emergency Medicine

## 2018-07-29 ENCOUNTER — Ambulatory Visit: Payer: Self-pay

## 2018-07-29 ENCOUNTER — Other Ambulatory Visit: Payer: Self-pay

## 2018-07-29 ENCOUNTER — Encounter (HOSPITAL_COMMUNITY): Payer: Self-pay | Admitting: Emergency Medicine

## 2018-07-29 ENCOUNTER — Emergency Department (HOSPITAL_COMMUNITY): Payer: BLUE CROSS/BLUE SHIELD

## 2018-07-29 ENCOUNTER — Ambulatory Visit: Payer: BLUE CROSS/BLUE SHIELD | Admitting: Internal Medicine

## 2018-07-29 DIAGNOSIS — R0989 Other specified symptoms and signs involving the circulatory and respiratory systems: Secondary | ICD-10-CM | POA: Diagnosis not present

## 2018-07-29 DIAGNOSIS — I10 Essential (primary) hypertension: Secondary | ICD-10-CM | POA: Diagnosis not present

## 2018-07-29 DIAGNOSIS — R0789 Other chest pain: Secondary | ICD-10-CM | POA: Insufficient documentation

## 2018-07-29 DIAGNOSIS — R079 Chest pain, unspecified: Secondary | ICD-10-CM

## 2018-07-29 DIAGNOSIS — Z79899 Other long term (current) drug therapy: Secondary | ICD-10-CM | POA: Insufficient documentation

## 2018-07-29 DIAGNOSIS — J45909 Unspecified asthma, uncomplicated: Secondary | ICD-10-CM | POA: Diagnosis not present

## 2018-07-29 DIAGNOSIS — Z87891 Personal history of nicotine dependence: Secondary | ICD-10-CM | POA: Insufficient documentation

## 2018-07-29 LAB — BASIC METABOLIC PANEL
Anion gap: 10 (ref 5–15)
BUN: 16 mg/dL (ref 6–20)
CO2: 28 mmol/L (ref 22–32)
Calcium: 9.8 mg/dL (ref 8.9–10.3)
Chloride: 105 mmol/L (ref 98–111)
Creatinine, Ser: 0.98 mg/dL (ref 0.44–1.00)
GFR calc Af Amer: >60 mL/min (ref >60)
GFR calc non Af Amer: >60 mL/min (ref >60)
Glucose, Bld: 89 mg/dL (ref 70–99)
Potassium: 3.5 mmol/L (ref 3.5–5.1)
Sodium: 143 mmol/L (ref 135–145)

## 2018-07-29 LAB — CBC
HCT: 38.7 % (ref 36.0–46.0)
Hemoglobin: 12.6 g/dL (ref 12.0–15.0)
MCH: 27.6 pg (ref 26.0–34.0)
MCHC: 32.6 g/dL (ref 30.0–36.0)
MCV: 84.9 fL (ref 78.0–100.0)
Platelets: 338 10*3/uL (ref 150–400)
RBC: 4.56 MIL/uL (ref 3.87–5.11)
RDW: 15.4 % (ref 11.5–15.5)
WBC: 4.2 10*3/uL (ref 4.0–10.5)

## 2018-07-29 LAB — I-STAT TROPONIN, ED
Troponin i, poc: 0.02 ng/mL (ref 0.00–0.08)
Troponin i, poc: 0 ng/mL (ref 0.00–0.08)

## 2018-07-29 LAB — I-STAT BETA HCG BLOOD, ED (MC, WL, AP ONLY): I-stat hCG, quantitative: 6.1 m[IU]/mL — ABNORMAL HIGH (ref <5)

## 2018-07-29 LAB — D-DIMER, QUANTITATIVE: D-Dimer, Quant: <0.27 ug/mL-FEU (ref 0.00–0.50)

## 2018-07-29 MED ORDER — OMEPRAZOLE 20 MG PO CPDR: 20.0000 mg | DELAYED_RELEASE_CAPSULE | Freq: Every day | ORAL | 0 refills | Status: DC

## 2018-07-29 MED ORDER — GI COCKTAIL ~~LOC~~
30.0000 mL | Freq: Once | ORAL | Status: AC
  Administered 2018-07-29: 30 mL via ORAL
  Filled 2018-07-29: qty 30

## 2018-07-29 NOTE — ED Notes (Signed)
Pt is alert and oriented x 4 and is verbally responsive. Pt report 5/10 central chest pressure.  Pt reports that discomfort began 3 days ago. Pt denies SOB, nausea or vomiting. Pt dtrs are at bedside.

## 2018-07-29 NOTE — Telephone Encounter (Signed)
Pt called with C/O chest tightness that feels like a band around her chest.  She says it travels to her shoulders bilaterally and it feels like some one is pushing on her shoulders. Risk factors of HTN and Dx with enlarged heart. Pt had wheezing and coughing yesterday and used her inhaler which relieved the wheezing and cough. Pt has hx of asthma. Pt describes the pressure she feels as between mild and moderate. She states"Iknow it is there. Im not doing anything strenuous since finding out my heart is enlarged". Care advice read to patient.  Pt verbalized understanding of all. Pt will go to [facility] ED for evaluation per protocol.   Reason for Disposition . Pain also present in shoulder(s) or arm(s) or jaw  (Exception: pain is clearly made worse by movement)    Pain is continuous and in both shoulders feels like a band around chest for last 3 days  Answer Assessment - Initial Assessment Questions 1. LOCATION: "Where does it hurt?"       Band around chest  2. RADIATION: "Does the pain go anywhere else?" (e.g., into neck, jaw, arms, back)     Shoulder both 3. ONSET: "When did the chest pain begin?" (Minutes, hours or days)      2- 3days ago 4. PATTERN "Does the pain come and go, or has it been constant since it started?"  "Does it get worse with exertion?"      constant 5. DURATION: "How long does it last" (e.g., seconds, minutes, hours)     constat 6. SEVERITY: "How bad is the pain?"  (e.g., Scale 1-10; mild, moderate, or severe)    - MILD (1-3): doesn't interfere with normal activities     - MODERATE (4-7): interferes with normal activities or awakens from sleep    - SEVERE (8-10): excruciating pain, unable to do any normal activities       moderate 7. CARDIAC RISK FACTORS: "Do you have any history of heart problems or risk factors for heart disease?" (e.g., prior heart attack, angina; high blood pressure, diabetes, being overweight, high cholesterol, smoking, or strong family history of  heart disease)     HTN  Enlarge heart 8. PULMONARY RISK FACTORS: "Do you have any history of lung disease?"  (e.g., blood clots in lung, asthma, emphysema, birth control pills)     asthma 9. CAUSE: "What do you think is causing the chest pain?"     no 10. OTHER SYMPTOMS: "Do you have any other symptoms?" (e.g., dizziness, nausea, vomiting, sweating, fever, difficulty breathing, cough)       Breathing difficulty cough yesterday used inhaler 11. PREGNANCY: "Is there any chance you are pregnant?" "When was your last menstrual period?"       N/A  Protocols used: CHEST PAIN-A-AH

## 2018-07-29 NOTE — Telephone Encounter (Signed)
Patient has arrived at [facility].

## 2018-07-29 NOTE — ED Triage Notes (Signed)
Patient here from home with complaints of chest tightness x3 days. Also reports "It feels like someone is pressing down on my shoulders".

## 2018-07-29 NOTE — Telephone Encounter (Signed)
Will monitor for ED arrival.  

## 2018-07-29 NOTE — Discharge Instructions (Signed)
Read instructions below for reasons to return to the Emergency Department. It is recommended that your follow up with your Primary Care Doctor in regards to today's visit. If you do not have a doctor, use the resource guide listed below to help you find one.  Begin taking over the counter Prilosec or Zegrid (omeprazole) as directed.   Tests performed today include: An EKG of your heart A chest x-ray D-Dimer Cardiac enzymes - a blood test for heart muscle damage - x 2 Blood counts and electrolytes Vital signs. See below for your results today.   Chest Pain (Nonspecific)  HOME CARE INSTRUCTIONS  For the next few days, avoid physical activities that bring on chest pain. Continue physical activities as directed.  Do not smoke cigarettes or drink alcohol until your symptoms are gone. If you do smoke, it is time to quit. You may receive instructions and counseling on how to stop smoking. Only take over-the-counter or prescription medicine for pain, discomfort, or fever as directed by your caregiver.  Follow your caregiver's suggestions for further testing if your chest pain does not go away.  Keep any follow-up appointments you made. If you do not go to an appointment, you could develop lasting (chronic) problems with pain. If there is any problem keeping an appointment, you must call to reschedule.  SEEK MEDICAL CARE IF:  You think you are having problems from the medicine you are taking. Read your medicine instructions carefully.  Your chest pain does not go away, even after treatment.  You develop a rash with blisters on your chest.  SEEK IMMEDIATE MEDICAL CARE IF:  You have increased chest pain or pain that spreads to your arm, neck, jaw, back, or belly (abdomen).  You develop shortness of breath, an increasing cough, or you are coughing up blood.  You have severe back or abdominal pain, feel sick to your stomach (nauseous) or throw up (vomit).  You develop severe weakness, fainting, or chills.   You have an oral temperature above 102 F (38.9 C), not controlled by medicine.  THIS IS AN EMERGENCY. Do not wait to see if the pain will go away. Get medical help at once. Call your local emergency services (911 in [place] Do not drive yourself to the hospital. Additional Information:  Your vital signs today were: BP 125/85    Pulse (!) 48    Temp 98.6 F (37 C) (Oral)    Resp 20    Ht 5' (1.524 m)    Wt 64.4 kg    SpO2 92%    BMI 27.73 kg/m  If your blood pressure (BP) was elevated above 135/85 this visit, please have this repeated by your doctor within one month. ---------------

## 2018-07-29 NOTE — ED Provider Notes (Signed)
[facility] DEPT Provider Note   CSN: [id number] Arrival date & time: 07/29/18  1224     History   Chief Complaint Chief Complaint  Patient presents with  . Chest Pain    HPI [name] is a 52 y.o. female with a history of hypertension, GERD, asthma who presents emergency department today for chest pain/tightness.  Patient reports that 3 days ago she was lying in bed when she developed chest tightness that is diffuse across her chest like a "rubber band around my chest" as well as "someone standing on my shoulders".  She notes that the pain has been constant since onset and rates as a 5/10.  The pain is not better or worse since onset.  Has not changed in characteristic.  She notes that her pain is not worsened with positional changes or exertion.  She notes at times it does feel more painful with deep breathing.  She denies any associated radiation to her neck, jaw, back, abdomen or down either arms.  No associated nausea, vomiting, diaphoresis or shortness of breath.  She denies any associated syncope, visual changes, focal weakness.  Patient reports that she is tried her albuterol inhaler for symptoms with mild relief.  She states this however does not feel similar to her asthma attacks in the past because with those she usually feels short of breath.  She has had some burping and belching with this.  No reported chest trauma.  No aspirin or nitroglycerin use.  She denies any alcohol or drug use (cocaine use).  Patient denies any associated fever, chills, cough, hemoptysis, lower extremity swelling, recent surgery/trauma/immobilization, history of cancer or previous history of blood clot. No exogenous estrogen/hormone use. She denies blood thinners. No family history of CAD. She quit smoking 7 years ago. Recent echo showed G1 DD, LVH, no wall motion abnormalities and EF of 60 to 65%.  No past stress test or heart catheterizations.  No history of MI, CVA or TIA.  Patients daughters are at bedside and supportive. No increase in stress recently.   HPI  Past Medical History:  Diagnosis Date  . 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   . Allergy   . Anemia   . Asthma   . GERD (gastroesophageal reflux disease)   . Heart murmur   . Hypertension   . Positive TB test   . Shingles   . TOBACCO USE 03/22/2009   Qualifier: Diagnosis of  By: Regis Bill MD, [name]  Stopped Dec 12 2010      Patient Active Problem List   Diagnosis Date Noted  . Headache 07/02/2013  . Cough variant asthma 09/29/2009  . Essential hypertension 05/30/2009  . GERD 05/12/2008  . HOT FLASHES 05/12/2008  . 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 ALLERGIC RHINITIS 08/07/2007    Past Surgical History:  Procedure Laterality Date  . ABDOMINAL HYSTERECTOMY    . BREAST SURGERY     cyst from left breast  . BUNIONECTOMY     both feet  . CHOLECYSTECTOMY    . CYSTECTOMY     left shoulder  . KNEE SURGERY Left   . PANENDOSCOPY    . TUBAL LIGATION    . uterine ablastion     and polyps removed  . Uterine polyps       OB History   None      Home Medications    Prior to Admission medications   Medication Sig Start Date End  Date Taking? Authorizing Provider  acetaminophen-codeine (TYLENOL #3) 300-30 MG tablet Take 1 tablet by mouth every 8 (eight) hours as needed (mild pain).    [provider]  albuterol (VENTOLIN HFA) 108 (90 Base) MCG/ACT inhaler Inhale 2 puffs into the lungs every 4 (four) hours as needed for wheezing or shortness of breath.    [provider]  budesonide-formoterol (SYMBICORT) 80-4.5 MCG/ACT inhaler INHALE 2 PUFFS INTO THE LUNGS 2 (TWO) TIMES DAILY. 05/20/18   [name], MD  budesonide-formoterol (SYMBICORT) 80-4.5 MCG/ACT inhaler Inhale 2 puffs into the lungs 2 (two) times daily. 06/25/18   Parrett, Fonnie Mu, NP  diltiazem (CARDIZEM LA) 240 MG 24 hr tablet Take 1 tablet (240 mg total) by mouth daily. 05/26/18   [name],  MD  EPINEPHrine 0.15 MG/0.15ML IJ injection Inject 0.15 mLs (0.15 mg total) into the muscle as needed for anaphylaxis. 06/19/18   [name], [name], MD  hydrochlorothiazide (HYDRODIURIL) 25 MG tablet Take 1 tablet (25 mg total) by mouth daily. 05/26/18   [name], MD  ibuprofen (ADVIL,MOTRIN) 800 MG tablet 1 po very 8-12 hours  For 10 days  Or as needed 04/24/18   [name], [name], MD  methocarbamol (ROBAXIN) 500 MG tablet Take 1-2 tablets (500-1,000 mg total) by mouth every 8 (eight) hours as needed for muscle spasms. 04/24/18   [name], [name], MD  pantoprazole (PROTONIX) 40 MG tablet TAKE ONE TABLET BY MOUTH 30-60 MINUTES BEFORE YOUR FIRST AND LAST MEALS 06/16/18   [name], MD  traMADol (ULTRAM) 50 MG tablet Take 50 mg by mouth every 4 (four) hours as needed.    [provider]  valsartan (DIOVAN) 160 MG tablet Take 1 tablet (160 mg total) by mouth daily. 06/02/18   [name], [name], MD    Family History Family History  Problem Relation Age of Onset  . Hypertension Mother   . Alzheimer's disease Mother   . Hypertension Father   . Prostate cancer Father   . HIV Brother   . Lupus Sister   . Colon cancer Paternal Grandmother   . Esophageal cancer Neg Hx   . Liver cancer Neg Hx   . Pancreatic cancer Neg Hx   . Rectal cancer Neg Hx   . Stomach cancer Neg Hx     Social History Social History   Tobacco Use  . Smoking status: Former Smoker    Packs/day: 0.50    Years: 17.00    Pack years: 8.50    Types: Cigarettes    Last attempt to quit: 12/12/2010    Years since quitting: 7.6  . Smokeless tobacco: Never Used  Substance Use Topics  . Alcohol use: No  . Drug use: No     Allergies   Diovan hct [valsartan-hydrochlorothiazide]; Purified water [water, sterile]; Allegra [fexofenadine]; Chocolate; Cimetidine; Citric acid; Coly-mycin s; Peanut-containing drug products; and Amlodipine   Review of Systems Review of Systems  All other systems reviewed and are  negative.    Physical Exam Updated Vital Signs BP 125/85   Pulse (!) 53   Temp 98.6 F (37 C) (Oral)   Resp 18   Ht 5' (1.524 m)   Wt 64.4 kg   SpO2 99%   BMI 27.73 kg/m   Physical Exam  Constitutional: She appears well-developed and well-nourished.  HENT:  Head: Normocephalic and atraumatic.  Right Ear: External ear normal.  Left Ear: External ear normal.  Nose: Nose normal.  Mouth/Throat: Uvula is midline,  oropharynx is clear and moist and mucous membranes are normal. No tonsillar exudate.  Eyes: Pupils are equal, round, and reactive to light. Right eye exhibits no discharge. Left eye exhibits no discharge. No scleral icterus.  Neck: Trachea normal. Neck supple. No spinous process tenderness present. No neck rigidity. Normal range of motion present.  Cardiovascular: Normal rate, regular rhythm and intact distal pulses.  No murmur heard. Pulses:      Radial pulses are 2+ on the right side, and 2+ on the left side.       Dorsalis pedis pulses are 2+ on the right side, and 2+ on the left side.       Posterior tibial pulses are 2+ on the right side, and 2+ on the left side.  No lower extremity swelling or edema. Calves symmetric in size bilaterally.  Pulmonary/Chest: Effort normal and breath sounds normal. She exhibits no tenderness.  No increased work of breathing. No accessory muscle use. Patient is sitting upright, speaking in full sentences without difficulty   Abdominal: Soft. Bowel sounds are normal. There is no tenderness. There is no rebound and no guarding.  Musculoskeletal: She exhibits no edema.  Lymphadenopathy:    She has no cervical adenopathy.  Neurological: She is alert.  Skin: Skin is warm and dry. No rash noted. She is not diaphoretic.  Psychiatric: She has a normal mood and affect.  Nursing note and vitals reviewed.    ED Treatments / Results  Labs (all labs ordered are listed, but only abnormal results are displayed) Labs Reviewed  I-STAT BETA HCG  BLOOD, ED ([facility], [facility], [facility] ONLY) - Abnormal; Notable for the following components:      Result Value   I-stat hCG, quantitative 6.1 (*)    All other components within normal limits  BASIC METABOLIC PANEL  CBC  D-DIMER, QUANTITATIVE (NOT AT [facility])  I-STAT TROPONIN, ED  I-STAT TROPONIN, ED    EKG EKG Interpretation  Date/Time:  Wednesday July 29 2018 12:31:56 EDT Ventricular Rate:  52 PR Interval:    QRS Duration: 90 QT Interval:  446 QTC Calculation: 415 R Axis:   12 Text Interpretation:  Sinus rhythm Probable left atrial enlargement Abnormal R-wave progression, early transition Baseline wander Abnormal ekg Confirmed by Carmin Muskrat [phone]) on 07/29/2018 4:35:13 PM   Radiology Dg Chest 2 View  Result Date: 07/29/2018 CLINICAL DATA:  Chest tightness EXAM: CHEST - 2 VIEW COMPARISON:  05/26/2018 FINDINGS: Borderline cardiomegaly. Tortuous nonaneurysmal thoracic aorta. Both lungs are clear. No pleural effusion or pneumothorax. The visualized skeletal structures are unremarkable. IMPRESSION: No active cardiopulmonary disease. Electronically Signed   By: Ashley Royalty M.D.   On: 07/29/2018 13:50    Procedures Procedures (including critical care time)  Medications Ordered in ED Medications  gi cocktail (Maalox,Lidocaine,Donnatal) (30 mLs Oral Given 07/29/18 1434)     Initial Impression / Assessment and Plan / ED Course  I have reviewed the triage vital signs and the nursing notes.  Pertinent labs & imaging results that were available during my care of the patient were reviewed by me and considered in my medical decision making (see chart for details).     Patient presented with chest pain to the ED. Patient is to be discharged with recommendation to follow up with PCP in regards to today's hospital visit. Chest pain is not likely of cardiac or pulmonary etiology due to presentation, wells negative & negative d-dimer (do not suspect PE), stable vital signs, no tracheal deviation,  no JVD or  new murmur, RRR, breath sounds equal bilaterally, EKG without acute abnormalities, negative troponin x 2, and negative CXR. HEART score is 3, which places the patient at low risk. The patient did have some relief with GI cocktail. No wheezing on exam to make me concerned for asthma excerebration. Patient did have Hcg drawn in triage that was mildly elevated, however has had a hysterectomy in the past. Patient has been advised to return to the ED if chest pain becomes exertional, associated with diaphoresis or nausea, radiates to left jaw/arm, worsens or becomes concerning in any way. Patient appears reliable for follow up and is agreeable to discharge. I advised the patient to follow-up with their primary care provider this week. I advised the patient to return to the emergency department with new or worsening symptoms or new concerns. The patient verbalized understanding and agreement with plan. Family at bedside, supportive and in agreement.   Final Clinical Impressions(s) / ED Diagnoses   Final diagnoses:  Nonspecific chest pain    ED Discharge Orders         Ordered    omeprazole (PRILOSEC) 20 MG capsule  Daily     07/29/18 1821           Lorelle Gibbs 08/01/18 2114    Carmin Muskrat, MD 08/03/18 [phone]

## 2018-08-03 ENCOUNTER — Ambulatory Visit (INDEPENDENT_AMBULATORY_CARE_PROVIDER_SITE_OTHER): Payer: BLUE CROSS/BLUE SHIELD | Admitting: Cardiology

## 2018-08-03 ENCOUNTER — Encounter: Payer: Self-pay | Admitting: Cardiology

## 2018-08-03 VITALS — BP 130/88 | HR 72 | Ht 60.0 in | Wt 140.6 lb

## 2018-08-03 DIAGNOSIS — R079 Chest pain, unspecified: Secondary | ICD-10-CM | POA: Diagnosis not present

## 2018-08-03 DIAGNOSIS — I10 Essential (primary) hypertension: Secondary | ICD-10-CM

## 2018-08-03 DIAGNOSIS — Z01812 Encounter for preprocedural laboratory examination: Secondary | ICD-10-CM

## 2018-08-03 DIAGNOSIS — R5383 Other fatigue: Secondary | ICD-10-CM

## 2018-08-03 MED ORDER — METOPROLOL TARTRATE 50 MG PO TABS: 50.0000 mg | ORAL_TABLET | Freq: Once | ORAL | 0 refills | Status: DC

## 2018-08-03 NOTE — Progress Notes (Signed)
Cardiology Office Note:    Date:  08/03/2018   ID:  [name], DOB [birth year], MRN [id number]  PCP:  Burnis Medin, MD  Cardiologist:  No primary care provider on file.  Electrophysiologist:  None   Referring MD: Burnis Medin, MD     History of Present Illness:    [name] is a 52 y.o. female here for evaluation of chest pain at the request of Dr. Regis Bill.  She was in the emergency department on 07/29/2018 with chest pain.  Has hypertension GERD asthma.  Developed chest tightness across chest, rubber band like while laying in bed felt like someone standing on her shoulders.  Pain was fairly constant 5/10.  It was lasting a few days duration.  Sometimes it feels slightly more painful with deep breathing.  No other radiation to neck jaw back.  No nausea.  She tried to take her albuterol to see if this would help but this did not.  No recent trauma to the chest.  No history of blood clots.  Quit smoking approximately 7 years ago.  She still continues to have some of this discomfort.  Once again no major changes with movement or activity.  No rashes.  When palpating, she does have some minor chest wall discomfort left side and central.  She is also feeling some discomfort midway down her arms bilaterally.  Echocardiogram showed EF 60 to 65%, LVH.  She has not had any prior heart catheterizations, stress test.  No prior stroke history.  No prior history of MI.  Troponin was negative x2.  EKG did not show any acute abnormalities.  Chest pain was felt to be likely noncardiac or pulmonary in origin.  Negative d-dimer.  She is also been complaining of fatigue as well.  Denies any fevers chills nausea vomiting syncope   Past Medical History:  Diagnosis Date  . 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   . Allergy   . Anemia   . Asthma   . GERD (gastroesophageal reflux disease)   . Heart murmur   . Hypertension   . Positive TB test   . Shingles   .  TOBACCO USE 03/22/2009   Qualifier: Diagnosis of  By: Regis Bill MD, [name]  Stopped Dec 12 2010      Past Surgical History:  Procedure Laterality Date  . ABDOMINAL HYSTERECTOMY    . BREAST SURGERY     cyst from left breast  . BUNIONECTOMY     both feet  . CHOLECYSTECTOMY    . CYSTECTOMY     left shoulder  . KNEE SURGERY Left   . PANENDOSCOPY    . TUBAL LIGATION    . uterine ablastion     and polyps removed  . Uterine polyps      Current Medications: Current Meds  Medication Sig  . albuterol (VENTOLIN HFA) 108 (90 Base) MCG/ACT inhaler Inhale 2 puffs into the lungs every 4 (four) hours as needed for wheezing or shortness of breath.  . APPLE CIDER VINEGAR PO Take 2,600 mg by mouth daily.  . budesonide-formoterol (SYMBICORT) 80-4.5 MCG/ACT inhaler INHALE 2 PUFFS INTO THE LUNGS 2 (TWO) TIMES DAILY.  Marland Kitchen EPINEPHrine 0.15 MG/0.15ML IJ injection Inject 0.15 mLs (0.15 mg total) into the muscle as needed for anaphylaxis.  . fluticasone (FLONASE) 50 MCG/ACT nasal spray Place 2 sprays into both nostrils daily.  . hydrochlorothiazide (HYDRODIURIL) 25 MG tablet Take 1 tablet (25 mg total) by mouth  daily.  . ibuprofen (ADVIL,MOTRIN) 800 MG tablet 1 po very 8-12 hours  For 10 days  Or as needed  . methocarbamol (ROBAXIN) 500 MG tablet Take 1-2 tablets (500-1,000 mg total) by mouth every 8 (eight) hours as needed for muscle spasms.  Marland Kitchen omeprazole (PRILOSEC) 20 MG capsule Take 1 capsule (20 mg total) by mouth daily.  . pantoprazole (PROTONIX) 40 MG tablet TAKE ONE TABLET BY MOUTH 30-60 MINUTES BEFORE YOUR FIRST AND LAST MEALS  . valsartan (DIOVAN) 160 MG tablet Take 1 tablet (160 mg total) by mouth daily.     Allergies:   Diovan hct [valsartan-hydrochlorothiazide]; Purified water [water, sterile]; Allegra [fexofenadine]; Chocolate; Cimetidine; Citric acid; Coly-mycin s; Peanut-containing drug products; and Amlodipine   Social History   Socioeconomic History  . Marital status: Divorced    Spouse  name: Not on file  . Number of children: Not on file  . Years of education: Not on file  . Highest education level: Not on file  Occupational History  . Not on file  Social Needs  . Financial resource strain: Not on file  . Food insecurity:    Worry: Not on file    Inability: Not on file  . Transportation needs:    Medical: Not on file    Non-medical: Not on file  Tobacco Use  . Smoking status: Former Smoker    Packs/day: 0.50    Years: 17.00    Pack years: 8.50    Types: Cigarettes    Last attempt to quit: 12/12/2010    Years since quitting: 7.6  . Smokeless tobacco: Never Used  Substance and Sexual Activity  . Alcohol use: No  . Drug use: No  . Sexual activity: Not on file  Lifestyle  . Physical activity:    Days per week: Not on file    Minutes per session: Not on file  . Stress: Not on file  Relationships  . Social connections:    Talks on phone: Not on file    Gets together: Not on file    Attends religious service: Not on file    Active member of club or organization: Not on file    Attends meetings of clubs or organizations: Not on file    Relationship status: Not on file  Other Topics Concern  . Not on file  Social History Narrative   Divorced    Tobacco since age 31 stopped feb 2012   Works  [employer], broadcasting/film/video.Patient account REP   Now works [employer]      Family History: The patient's family history includes Alzheimer's disease in her mother; Colon cancer in her paternal grandmother; HIV in her brother; Hypertension in her father and mother; Lupus in her sister; Prostate cancer in her father. There is no history of Esophageal cancer, Liver cancer, Pancreatic cancer, Rectal cancer, or Stomach cancer.  ROS:   Please see the history of present illness.     All other systems reviewed and are negative.  EKGs/Labs/Other Studies Reviewed:    The following studies were reviewed today:  ECHO 06/12/18 - Left ventricle: The cavity size was  normal. There was moderate   concentric hypertrophy. Systolic function was normal. The   estimated ejection fraction was in the range of 60% to 65%. Wall   motion was normal; there were no regional wall motion   abnormalities. Doppler parameters are consistent with abnormal   left ventricular relaxation (grade 1 diastolic dysfunction). The   E/e&' ratio  is between 8-15, suggesting indeterminate LV filling   pressure. - Mitral valve: Mildly thickened leaflets . There was trivial   regurgitation. - Left atrium: The atrium was normal in size. - Inferior vena cava: The vessel was normal in size. The   respirophasic diameter changes were in the normal range (>= 50%),   consistent with normal central venous pressure.  Impressions:  - LVEF 60-65%, moderate LVH, normal wall motion, grade 1 DD,   indeterminate LV filing pressure, trivial MR, normal LA size,   normal IVC.  Chest x-ray 07/29/2018: Borderline cardiomegaly. Tortuous nonaneurysmal thoracic aorta. Both lungs are clear. No pleural effusion or pneumothorax. The visualized skeletal structures are unremarkable.  IMPRESSION: No active cardiopulmonary disease.  EKG: 07/29/2018-sinus bradycardia 52 with no other significant abnormalities.  Recent Labs: 05/19/2018: ALT 10; TSH 0.72 07/29/2018: BUN 16; Creatinine, Ser 0.98; Hemoglobin 12.6; Platelets 338; Potassium 3.5; Sodium 143  Recent Lipid Panel    Component Value Date/Time   CHOL 195 05/19/2018 1129   TRIG 41.0 05/19/2018 1129   HDL 83.80 05/19/2018 1129   CHOLHDL 2 05/19/2018 1129   VLDL 8.2 05/19/2018 1129   LDLCALC 103 (H) 05/19/2018 1129    Physical Exam:    VS:  BP 130/88   Pulse 72   Ht 5' (1.524 m)   Wt 140 lb 9.6 oz (63.8 kg)   BMI 27.46 kg/m     Wt Readings from Last 3 Encounters:  08/03/18 140 lb 9.6 oz (63.8 kg)  07/29/18 142 lb (64.4 kg)  06/25/18 142 lb 12.8 oz (64.8 kg)     GEN:  Well nourished, well developed in no acute distress HEENT:  Normal NECK: No JVD; No carotid bruits LYMPHATICS: No lymphadenopathy CARDIAC: RRR, no murmurs, rubs, gallops RESPIRATORY:  Clear to auscultation without rales, wheezing or rhonchi  ABDOMEN: Soft, non-tender, non-distended MUSCULOSKELETAL:  No edema; No deformity  SKIN: Warm and dry NEUROLOGIC:  Alert and oriented x 3 PSYCHIATRIC:  Normal affect   ASSESSMENT:    1. Chest pain, unspecified type   2. Essential hypertension   3. Pre-procedure lab exam   4. Other fatigue    PLAN:    In order of problems listed above:  Chest pain - Risk factors include former smoker, hypertension, LVH on echocardiogram.  D-dimer was normal, troponin was normal, EKG without any specific ischemic changes.  Possible etiologies include musculoskeletal discomfort.  I would like to obtain a coronary CT scan with FFR analysis to hopefully exclude the possibility of coronary artery disease or proximal aortic disease.  Chest x-ray personally reviewed as above.  Echocardiogram personally reviewed shows normal EF, mild LVH.  In the meantime, continue to treat hypertension.  She notes that she does not have a reaction to contrast.  Essential hypertension - Currently fairly well controlled.  Continue to treat.  Medications reviewed.  Fatigue -Certainly could be multifactorial.  Hemoglobin reassuring.  We will try to exclude cardiac cause.  We will follow-up with results of scan.  Medication Adjustments/Labs and Tests Ordered: Current medicines are reviewed at length with the patient today.  Concerns regarding medicines are outlined above.  Orders Placed This Encounter  Procedures  . CT CORONARY MORPH W/CTA COR W/SCORE W/CA W/CM &/OR WO/CM  . CT CORONARY FRACTIONAL FLOW RESERVE DATA PREP  . CT CORONARY FRACTIONAL FLOW RESERVE FLUID ANALYSIS  . Basic metabolic panel   Meds ordered this encounter  Medications  . metoprolol tartrate (LOPRESSOR) 50 MG tablet    Sig: Take 1  tablet (50 mg total) by mouth once for  1 dose. Take 1 tablet 1 hour prior to your Coronary CT    Dispense:  1 tablet    Refill:  0    Patient Instructions  Medication Instructions:  The current medical regimen is effective;  continue present plan and medications.  Labwork: You will need to return for blood work prior to your CT scan.  Testing/Procedures: Your physician has requested that you have Coronary CT. Cardiac computed tomography (CT) is a painless test that uses an x-ray machine to take clear, detailed pictures of your heart.. Please follow instruction sheet as given.  Follow-Up: Follow up as needed after the above testing.  Thank you for choosing [place]!!    Please arrive at the [facility] main entrance of [facility] at xx:xx AM (30-45 minutes prior to test start time)  [facility] [place] [facility] [place], [place] [address] [phone]  Proceed to the [facility] Radiology Department (First Floor).  Please follow these instructions carefully (unless otherwise directed):  On the Night Before the Test: . Be sure to Drink plenty of water. . Do not consume any caffeinated/decaffeinated beverages or chocolate 12 hours prior to your test. . Do not take any antihistamines 12 hours prior to your test.  On the Day of the Test: . Drink plenty of water. Do not drink any water within one hour of the test. . Do not eat any food 4 hours prior to the test. . You may take your regular medications prior to the test. . IF NOT ON BETA BLOCKER-Take 50 mg of Lopressor (metoprolol) one hour before test . HOLD Furosemide morning of the test.  After the Test: . Drink plenty of water. . After receiving IV contrast, you may experience a mild flushed feeling. This is normal. . On occasion, you may experience a mild rash up to 24 hours after the test. This is not dangerous. If this occurs, you can take Benadryl 25 mg and increase your fluid intake. . If you experience trouble breathing,  this can be serious. If it is severe call 911 IMMEDIATELY. If it is mild, please call our office.     Signed, Candee Furbish, MD  08/03/2018 11:00 AM    [place]

## 2018-08-03 NOTE — Patient Instructions (Addendum)
Medication Instructions:  The current medical regimen is effective;  continue present plan and medications.  Labwork: You will need to return for blood work prior to your CT scan.  Testing/Procedures: Your physician has requested that you have Coronary CT. Cardiac computed tomography (CT) is a painless test that uses an x-ray machine to take clear, detailed pictures of your heart.. Please follow instruction sheet as given.  Follow-Up: Follow up as needed after the above testing.  Thank you for choosing [place]!!    Please arrive at the [facility] main entrance of [facility] at xx:xx AM (30-45 minutes prior to test start time)  [facility] [place], [place] [address] [phone]  Proceed to the [facility] Radiology Department (First Floor).  Please follow these instructions carefully (unless otherwise directed):  On the Night Before the Test: . Be sure to Drink plenty of water. . Do not consume any caffeinated/decaffeinated beverages or chocolate 12 hours prior to your test. . Do not take any antihistamines 12 hours prior to your test.  On the Day of the Test: . Drink plenty of water. Do not drink any water within one hour of the test. . Do not eat any food 4 hours prior to the test. . You may take your regular medications prior to the test. . IF NOT ON BETA BLOCKER-Take 50 mg of Lopressor (metoprolol) one hour before test . HOLD Furosemide morning of the test.  After the Test: . Drink plenty of water. . After receiving IV contrast, you may experience a mild flushed feeling. This is normal. . On occasion, you may experience a mild rash up to 24 hours after the test. This is not dangerous. If this occurs, you can take Benadryl 25 mg and increase your fluid intake. . If you experience trouble breathing, this can be serious. If it is severe call 911 IMMEDIATELY. If it is mild, please call our office.

## 2018-08-10 ENCOUNTER — Telehealth: Payer: Self-pay | Admitting: Internal Medicine

## 2018-08-10 ENCOUNTER — Other Ambulatory Visit: Payer: Self-pay | Admitting: Internal Medicine

## 2018-08-10 MED ORDER — HYDROCHLOROTHIAZIDE 25 MG PO TABS: 25.0000 mg | ORAL_TABLET | Freq: Every day | ORAL | 0 refills | Status: DC

## 2018-08-10 NOTE — Telephone Encounter (Signed)
Spoke with pt. She is needing a refill on HCTZ. Advised her that her PCP needs to take over her refills. Pt states that she can't see her PCP until the end of the month. I have refilled HCTZ #30 for the pt. Nothing further was needed.

## 2018-08-10 NOTE — Telephone Encounter (Signed)
Copied from [place] [phone]. Topic: General - Other >> Aug 10, 2018  2:27 PM [name], NT wrote: Reason for CRM: Pt states that her church is going on a fast and she wants to be sure that it is safe for her with her medications, health history etc? Her church is also planning to go to [place] and she would like to know if there should be any concerns with all the immunizations she would have to have and allergies to meds. Also if Dr. Regis Bill thinks she would be ok to go or should she not attend?

## 2018-08-10 NOTE — Telephone Encounter (Signed)
Spoke to the pt.  Informed her that Dr. Regis Bill will not be out of the office until the 17th.  She stated the fast started yesterday.  Advised her not to fast unless authorized by Dr. Regis Bill. Pt will forego this fast but has another coming up in Feb.  Would it be safe for her to fast for a period of time?  She is also traveling to [place] in Nov. 2020.  Would like to know what immunizations are needed/recommended.  Pt has an appt coming up on 08/28/18 and would like to discuss these at that time.  Will forward to Dr. Mamie Nick as Juluis Rainier.

## 2018-08-19 ENCOUNTER — Other Ambulatory Visit: Payer: Self-pay

## 2018-08-19 ENCOUNTER — Encounter (HOSPITAL_COMMUNITY): Payer: Self-pay | Admitting: Emergency Medicine

## 2018-08-19 ENCOUNTER — Emergency Department (HOSPITAL_COMMUNITY): Payer: BLUE CROSS/BLUE SHIELD

## 2018-08-19 ENCOUNTER — Ambulatory Visit: Payer: Self-pay

## 2018-08-19 ENCOUNTER — Emergency Department (HOSPITAL_COMMUNITY)
Admission: EM | Admit: 2018-08-19 | Discharge: 2018-08-19 | Disposition: A | Discharge status: Home / Self Care | Payer: BLUE CROSS/BLUE SHIELD | Attending: Emergency Medicine | Admitting: Emergency Medicine

## 2018-08-19 DIAGNOSIS — R001 Bradycardia, unspecified: Secondary | ICD-10-CM | POA: Diagnosis not present

## 2018-08-19 DIAGNOSIS — M542 Cervicalgia: Secondary | ICD-10-CM | POA: Diagnosis not present

## 2018-08-19 DIAGNOSIS — Z79899 Other long term (current) drug therapy: Secondary | ICD-10-CM | POA: Diagnosis not present

## 2018-08-19 DIAGNOSIS — R079 Chest pain, unspecified: Secondary | ICD-10-CM | POA: Diagnosis not present

## 2018-08-19 DIAGNOSIS — J45909 Unspecified asthma, uncomplicated: Secondary | ICD-10-CM | POA: Diagnosis not present

## 2018-08-19 DIAGNOSIS — I10 Essential (primary) hypertension: Secondary | ICD-10-CM | POA: Insufficient documentation

## 2018-08-19 DIAGNOSIS — Z87891 Personal history of nicotine dependence: Secondary | ICD-10-CM | POA: Diagnosis not present

## 2018-08-19 DIAGNOSIS — M25512 Pain in left shoulder: Secondary | ICD-10-CM | POA: Diagnosis not present

## 2018-08-19 LAB — BASIC METABOLIC PANEL
Anion gap: 9 (ref 5–15)
BUN: 20 mg/dL (ref 6–20)
CO2: 27 mmol/L (ref 22–32)
Calcium: 9.5 mg/dL (ref 8.9–10.3)
Chloride: 105 mmol/L (ref 98–111)
Creatinine, Ser: 0.96 mg/dL (ref 0.44–1.00)
GFR calc Af Amer: >60 mL/min (ref >60)
GFR calc non Af Amer: >60 mL/min (ref >60)
Glucose, Bld: 114 mg/dL — ABNORMAL HIGH (ref 70–99)
Potassium: 3.2 mmol/L — ABNORMAL LOW (ref 3.5–5.1)
Sodium: 141 mmol/L (ref 135–145)

## 2018-08-19 LAB — CBC
HCT: 39.3 % (ref 36.0–46.0)
Hemoglobin: 12.3 g/dL (ref 12.0–15.0)
MCH: 27.3 pg (ref 26.0–34.0)
MCHC: 31.3 g/dL (ref 30.0–36.0)
MCV: 87.1 fL (ref 80.0–100.0)
Platelets: 311 10*3/uL (ref 150–400)
RBC: 4.51 MIL/uL (ref 3.87–5.11)
RDW: 15.1 % (ref 11.5–15.5)
WBC: 5.7 10*3/uL (ref 4.0–10.5)
nRBC: 0 % (ref 0.0–0.2)

## 2018-08-19 LAB — I-STAT TROPONIN, ED: Troponin i, poc: 0 ng/mL (ref 0.00–0.08)

## 2018-08-19 MED ORDER — MELOXICAM 15 MG PO TABS: 15.0000 mg | ORAL_TABLET | Freq: Every day | ORAL | 0 refills | Status: DC

## 2018-08-19 MED FILL — VALSARTAN 160 MG TABLET: 160 | 30 days supply | Qty: 30 | Fill #2

## 2018-08-19 NOTE — Telephone Encounter (Signed)
Noted.  Will watch for patient's arrival at the ED.

## 2018-08-19 NOTE — Discharge Instructions (Addendum)
Your work up did not show any evidence that this is related to your heart.  You do have some degenerative changes at the C5-6 and  C6- 7 disc interspaces your neck.  This could be causing the pain you are feeling above your clavicle on the left side.  You may need further work-up with an outpatient MRI of your C-spine which shows the neck in much greater detail.  I think however that this is more likely related to muscular tension.  You may find some relief with physical therapy or massage therapy.  Your primary care doctor can give you a referral for evaluation and treatment of neck and shoulder pain with a physical therapist to treat this conservatively.  He will be discharged today with anti-inflammatory medication which may help with the sensation you are feeling.  Please follow very closely with your primary care physician.  Return to the emergency department if you have sudden onset chest pain, shortness of breath, nausea, feeling if you are going to pass out, changes in vision, speech, weakness on one side of your body, facial droop.

## 2018-08-19 NOTE — ED Notes (Signed)
Pt is alert and oriented x 4 and is verbally responsive. Pt is escorted with her daughter. Pt reports 5/10 collar bone pain that radiates to left shoulder.

## 2018-08-19 NOTE — ED Triage Notes (Addendum)
Patient c/o left collarbone pain radiating to left upper back since yesterday. Denies injury.

## 2018-08-19 NOTE — Telephone Encounter (Signed)
Pt called with pain that she rated at 4-5 to shoulder back and into collar bone. She state that it started while walking last evening and has continued.Pt states that she is breathing normally. She states that she felt very hot for extended time when pain begun. "It was different than a hot flash".  She was able to sleep last night but it was not restful with the pain. Pt deny's any injury to the area. She has recently been seen by a cardiologist for chest tightness that she was seen in the ED for evaluation on 07/29/18. She has a Hx of HTN, and a heart murmer. Pt instructed to go to the ED for evaluation of these new symptoms. She states that she has someone to drive. Care instructions read to patient. Pt will follow protocol.  Reason for Disposition . Pain also present in shoulder(s) or arm(s) or jaw  (Exception: pain is clearly made worse by movement)  Answer Assessment - Initial Assessment Questions 1. LOCATION: "Where does it hurt?"       Under breast and arm and neck collar bone area 2. RADIATION: "Does the pain go anywhere else?" (e.g., into neck, jaw, arms, back)     Neck back 3. ONSET: "When did the chest pain begin?" (Minutes, hours or days)      Yesterday during a walk 4. PATTERN "Does the pain come and go, or has it been constant since it started?"  "Does it get worse with exertion?"      constant 5. DURATION: "How long does it last" (e.g., seconds, minutes, hours)     hours 6. SEVERITY: "How bad is the pain?"  (e.g., Scale 1-10; mild, moderate, or severe)    - MILD (1-3): doesn't interfere with normal activities     - MODERATE (4-7): interferes with normal activities or awakens from sleep    - SEVERE (8-10): excruciating pain, unable to do any normal activities       Mod 4-5 7. 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 PULMONARY RISK FACTORS: "Do you have any history of lung disease?"  (e.g., blood clots in lung, asthma, emphysema, birth control pills)     no 9. CAUSE: "What do you think is causing the chest pain?"     no 10. OTHER SYMPTOMS: "Do you have any other symptoms?" (e.g., dizziness, nausea, vomiting, sweating, fever, difficulty breathing, cough)       Hot fashes 11. PREGNANCY: "Is there any chance you are pregnant?" "When was your last menstrual period?"       N/A  Protocols used: CHEST PAIN-A-AH

## 2018-08-19 NOTE — ED Provider Notes (Signed)
[place] DEPT Provider Note   CSN: [id number] Arrival date & time: 08/19/18  1329     History   Chief Complaint Chief Complaint  Patient presents with  . Back Pain    HPI [name] is a 52 y.o. female who present for evaluation of pain in the left suprascapular region and behind her left shoulder blade.  Patient states that she went on her normal lunch walk and when she got back she had sudden onset of 5 out of sharp, deep and achy pain in the left supraclavicular region radiating behind the left shoulder blade.  She states that nothing seems to make this worse or better including movement or deep breathing.  She says that when she had onset of the pain she developed about 10 minutes of diaphoresis.  She denies nausea vomiting or shortness of breath.  She has recent cardiac work-up including echocardiogram that showed mild mitral valve regurg and left grade 1 diastolic left ventricular dysfunction.  Dr. Marlou Porch wanted her to have a coronary artery CT scan however this was declined by her insurance.  HPI  Past Medical History:  Diagnosis Date  . 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   . Allergy   . Anemia   . Asthma   . GERD (gastroesophageal reflux disease)   . Heart murmur   . Hypertension   . Positive TB test   . Shingles   . TOBACCO USE 03/22/2009   Qualifier: Diagnosis of  By: Regis Bill MD, [name]  Stopped Dec 12 2010      Patient Active Problem List   Diagnosis Date Noted  . Headache 07/02/2013  . Cough variant asthma 09/29/2009  . Essential hypertension 05/30/2009  . GERD 05/12/2008  . HOT FLASHES 05/12/2008  . 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 ALLERGIC RHINITIS 08/07/2007    Past Surgical History:  Procedure Laterality Date  . ABDOMINAL HYSTERECTOMY    . BREAST SURGERY     cyst from left breast  . BUNIONECTOMY     both feet  . CHOLECYSTECTOMY    . CYSTECTOMY     left shoulder  . KNEE SURGERY Left   .  PANENDOSCOPY    . TUBAL LIGATION    . uterine ablastion     and polyps removed  . Uterine polyps       OB History   None      Home Medications    Prior to Admission medications   Medication Sig Start Date End Date Taking? Authorizing Provider  albuterol (VENTOLIN HFA) 108 (90 Base) MCG/ACT inhaler Inhale 2 puffs into the lungs every 4 (four) hours as needed for wheezing or shortness of breath.   Yes [provider]  APPLE CIDER VINEGAR PO Take 2,600 mg by mouth daily.   Yes [provider]  budesonide-formoterol (SYMBICORT) 80-4.5 MCG/ACT inhaler INHALE 2 PUFFS INTO THE LUNGS 2 (TWO) TIMES DAILY. 05/20/18  Yes [name], MD  ENSURE (ENSURE) Take 237 mLs by mouth daily.   Yes [provider]  EPINEPHrine 0.15 MG/0.15ML IJ injection Inject 0.15 mLs (0.15 mg total) into the muscle as needed for anaphylaxis. 06/19/18  Yes [name], [name], MD  fluticasone (FLONASE) 50 MCG/ACT nasal spray Place 2 sprays into both nostrils daily.   Yes [provider]  hydrochlorothiazide (HYDRODIURIL) 25 MG tablet Take 1 tablet (25 mg total) by mouth daily. 08/10/18  Yes [name], MD  ibuprofen (ADVIL,MOTRIN) 800  MG tablet 1 po very 8-12 hours  For 10 days  Or as needed Patient taking differently: Take 800 mg by mouth daily as needed for mild pain. 1 po very 8-12 hours  For 10 days  Or as needed 04/24/18  Yes [name], [name], MD  methocarbamol (ROBAXIN) 500 MG tablet Take 1-2 tablets (500-1,000 mg total) by mouth every 8 (eight) hours as needed for muscle spasms. 04/24/18  Yes [name], [name], MD  metoprolol tartrate (LOPRESSOR) 50 MG tablet Take 1 tablet (50 mg total) by mouth once for 1 dose. Take 1 tablet 1 hour prior to your Coronary CT 08/03/18 08/19/18 Yes Jerline Pain, MD  omeprazole (PRILOSEC) 20 MG capsule Take 1 capsule (20 mg total) by mouth daily. 07/29/18  Yes Maczis, Barth Kirks, PA-C  pantoprazole (PROTONIX) 40 MG tablet TAKE ONE TABLET BY MOUTH 30-60  MINUTES BEFORE YOUR FIRST AND LAST MEALS 06/16/18  Yes [name], MD  valsartan (DIOVAN) 160 MG tablet Take 1 tablet (160 mg total) by mouth daily. 06/02/18  Yes [name], [name], MD  meloxicam (MOBIC) 15 MG tablet Take 1 tablet (15 mg total) by mouth daily. Take 1 daily with food. 08/19/18   Margarita Mail, PA-C    Family History Family History  Problem Relation Age of Onset  . Hypertension Mother   . Alzheimer's disease Mother   . Hypertension Father   . Prostate cancer Father   . HIV Brother   . Lupus Sister   . Colon cancer Paternal Grandmother   . Esophageal cancer Neg Hx   . Liver cancer Neg Hx   . Pancreatic cancer Neg Hx   . Rectal cancer Neg Hx   . Stomach cancer Neg Hx     Social History Social History   Tobacco Use  . Smoking status: Former Smoker    Packs/day: 0.50    Years: 17.00    Pack years: 8.50    Types: Cigarettes    Last attempt to quit: 12/12/2010    Years since quitting: 7.6  . Smokeless tobacco: Never Used  Substance Use Topics  . Alcohol use: No  . Drug use: No     Allergies   Diovan hct [valsartan-hydrochlorothiazide]; Purified water [water, sterile]; Allegra [fexofenadine]; Chocolate; Cimetidine; Citric acid; Coly-mycin s; Peanut-containing drug products; and Amlodipine   Review of Systems Review of Systems  Ten systems reviewed and are negative for acute change, except as noted in the HPI.   Physical Exam Updated Vital Signs BP 121/89 (BP Location: Left Arm)   Pulse 60   Temp 97.8 F (36.6 C) (Oral)   Resp 13   SpO2 100%   Physical Exam  Constitutional: She is oriented to person, place, and time. She appears well-developed and well-nourished. No distress.  HENT:  Head: Normocephalic and atraumatic.  Eyes: Conjunctivae are normal. No scleral icterus.  Neck: Normal range of motion.  Cardiovascular: Normal rate, regular rhythm and normal heart sounds. Exam reveals no gallop and no friction rub.  No murmur  heard. Pulmonary/Chest: Effort normal and breath sounds normal. No respiratory distress.  Abdominal: Soft. Bowel sounds are normal. She exhibits no distension and no mass. There is no tenderness. There is no guarding.  Musculoskeletal:  Normal strength bilaterally.  No weakness of the upper extremities comparatively.  Tender to palpation in the left trapezius along the upper chest wall and supraclavicular region.  2+ radial pulses bilaterally  Neurological: She is alert and oriented to person, place, and time.  Skin: Skin is warm and dry. She is not diaphoretic.  Psychiatric: Her behavior is normal.  Nursing note and vitals reviewed.    ED Treatments / Results  Labs (all labs ordered are listed, but only abnormal results are displayed) Labs Reviewed  BASIC METABOLIC PANEL - Abnormal; Notable for the following components:      Result Value   Potassium 3.2 (*)    Glucose, Bld 114 (*)    All other components within normal limits  CBC  I-STAT TROPONIN, ED    EKG None ECG interpretation   Date: 08/19/2018  Rate: 57  Rhythm: normal sinus rhythm  QRS Axis: normal  Intervals: normal  ST/T Wave abnormalities: normal  Conduction Disutrbances: none  Narrative Interpretation:   Old EKG Reviewed: No significant changes noted    Radiology Dg Chest 2 View  Result Date: 08/19/2018 CLINICAL DATA:  Chest pain. EXAM: CHEST - 2 VIEW COMPARISON:  Radiographs of July 29, 2018. FINDINGS: The heart size and mediastinal contours are within normal limits. Both lungs are clear. No pneumothorax or pleural effusion is noted. The visualized skeletal structures are unremarkable. IMPRESSION: No active cardiopulmonary disease. Electronically Signed   By: Marijo Conception, M.D.   On: 08/19/2018 14:42   Ct Cervical Spine Wo Contrast  Result Date: 08/19/2018 CLINICAL DATA:  Neck pain, radiating into left shoulder and left clavicle. "Patient c/o left collarbone pain radiating to left upper back  since yesterday. Denies injury." EXAM: CT CERVICAL SPINE WITHOUT CONTRAST TECHNIQUE: Multidetector CT imaging of the cervical spine was performed without intravenous contrast. Multiplanar CT image reconstructions were also generated. COMPARISON:  None. FINDINGS: Alignment: Normal Skull base and vertebrae: Negative for fracture.  No focal lesion. Soft tissues and spinal canal: No prevertebral fluid or swelling. No visible canal hematoma. Disc levels: Moderate narrowing of the C5-6 and C6-7 interspaces with small anterior and posterior endplate spurs. No other significant osseous degenerative change. Upper chest: Negative. Other: None. IMPRESSION: 1. Negative for fracture or other acute abnormality. 2. Degenerative disc disease C5-6, C6-7. Electronically Signed   By: Lucrezia Europe M.D.   On: 08/19/2018 14:25    Procedures Procedures (including critical care time)  Medications Ordered in ED Medications - No data to display   Initial Impression / Assessment and Plan / ED Course  I have reviewed the triage vital signs and the nursing notes.  Pertinent labs & imaging results that were available during my care of the patient were reviewed by me and considered in my medical decision making (see chart for details).  Clinical Course as of Aug 20 2151  Wed Aug 19, 2018  1557 Potassium(!): 3.2 [AH]  1557 Glucose(!): 114 [AH]    Clinical Course User Index [AH] Margarita Mail, PA-C    Patient here for neck pain.  Diaphoresis yesterday.  She has had no provocative testing in her outpatient follow-up with her cardiologist and was unable to get a CT calcium score of her heart secondary to her insurance.  Given these facts patient needed a bit of a ACS work-up.  I have no concern for PE.  She had a d-dimer that was negative recently.  Has no pleuritic chest pain hypoxia or tachycardia.  The patient has a normal EKG, chest x-ray.  CT shows some degenerative changes which are likely associated with the pain she is  having in her shoulder blade region.  I have no concern for emergent cause of her symptoms.  She has had constant pain since yesterday  at 2 PM.  Patient appears appropriate for discharge at this time peer discussed return precautions.  Final Clinical Impressions(s) / ED Diagnoses   Final diagnoses:  Musculoskeletal neck pain    ED Discharge Orders         Ordered    meloxicam (MOBIC) 15 MG tablet  Daily     08/19/18 1603           Margarita Mail, PA-C 08/19/18 2152    Jola Schmidt, MD 08/21/18 [phone]

## 2018-08-19 NOTE — Telephone Encounter (Signed)
Patient at ED now  °

## 2018-08-20 NOTE — Telephone Encounter (Signed)
Short tem fasting is usually ok  If not on diabetes medication.  Please have her check web site CDC for travel  For location and length of stay  Advice  so we decide at her visit .

## 2018-08-26 NOTE — Telephone Encounter (Signed)
Pt is coming in to see Dr Regis Bill on 08/28/18 Will also need to discuss getting Dr Regis Bill to take over fluid pill refill.  Nothing further needed at this time.

## 2018-08-28 ENCOUNTER — Ambulatory Visit (INDEPENDENT_AMBULATORY_CARE_PROVIDER_SITE_OTHER): Payer: BLUE CROSS/BLUE SHIELD | Admitting: Internal Medicine

## 2018-08-28 ENCOUNTER — Encounter: Payer: Self-pay | Admitting: Internal Medicine

## 2018-08-28 VITALS — BP 118/88 | HR 68 | Temp 98.3°F | Wt 143.0 lb

## 2018-08-28 DIAGNOSIS — M542 Cervicalgia: Secondary | ICD-10-CM | POA: Diagnosis not present

## 2018-08-28 DIAGNOSIS — Z79899 Other long term (current) drug therapy: Secondary | ICD-10-CM

## 2018-08-28 DIAGNOSIS — Z23 Encounter for immunization: Secondary | ICD-10-CM

## 2018-08-28 DIAGNOSIS — E876 Hypokalemia: Secondary | ICD-10-CM | POA: Diagnosis not present

## 2018-08-28 DIAGNOSIS — I10 Essential (primary) hypertension: Secondary | ICD-10-CM

## 2018-08-28 DIAGNOSIS — R0789 Other chest pain: Secondary | ICD-10-CM

## 2018-08-28 MED ORDER — HYDROCHLOROTHIAZIDE 25 MG PO TABS: 25.0000 mg | ORAL_TABLET | Freq: Every day | ORAL | 4 refills | Status: DC

## 2018-08-28 NOTE — Patient Instructions (Addendum)
Inc potassium risk foods and then recheck  BMP in 3 weeks   This could be  Related to degenerative  Disc diseae although atypical as discussed . Reassuring that labs were negative in the ed.   Potassium Content of Foods Potassium is a mineral found in many foods and drinks. It helps keep fluids and minerals balanced in your body and affects how steadily your heart beats. Potassium also helps control your blood pressure and keep your muscles and nervous system healthy. Certain health conditions and medicines may change the balance of potassium in your body. When this happens, you can help balance your level of potassium through the foods that you do or do not eat. Your health care provider or dietitian may recommend an amount of potassium that you should have each day. The following lists of foods provide the amount of potassium (in parentheses) per serving in each item. High in potassium The following foods and beverages have 200 mg or more of potassium per serving:  Apricots, 2 raw or 5 dry (200 mg).  Artichoke, 1 medium (345 mg).  Avocado, raw,  each (245 mg).  Banana, 1 medium (425 mg).  Beans, lima, or baked beans, canned,  cup (280 mg).  Beans, white, canned,  cup (595 mg).  Beef roast, 3 oz (320 mg).  Beef, ground, 3 oz (270 mg).  Beets, raw or cooked,  cup (260 mg).  Bran muffin, 2 oz (300 mg).  Broccoli,  cup (230 mg).  Brussels sprouts,  cup (250 mg).  Cantaloupe,  cup (215 mg).  Cereal, 100% bran,  cup (200-400 mg).  Cheeseburger, single, fast food, 1 each (225-400 mg).  Chicken, 3 oz (220 mg).  Clams, canned, 3 oz (535 mg).  Crab, 3 oz (225 mg).  Dates, 5 each (270 mg).  Dried beans and peas,  cup (300-475 mg).  Figs, dried, 2 each (260 mg).  Fish: halibut, tuna, cod, snapper, 3 oz (480 mg).  Fish: salmon, haddock, swordfish, perch, 3 oz (300 mg).  Fish, tuna, canned 3 oz (200 mg).  [place] fries, fast food, 3 oz (470 mg).  Granola with  fruit and nuts,  cup (200 mg).  Grapefruit juice,  cup (200 mg).  Greens, beet,  cup (655 mg).  Honeydew melon,  cup (200 mg).  Kale, raw, 1 cup (300 mg).  Kiwi, 1 medium (240 mg).  Kohlrabi, rutabaga, parsnips,  cup (280 mg).  Lentils,  cup (365 mg).  Mango, 1 each (325 mg).  Milk, chocolate, 1 cup (420 mg).  Milk: nonfat, low-fat, whole, buttermilk, 1 cup (350-380 mg).  Molasses, 1 Tbsp (295 mg).  Mushrooms,  cup (280) mg.  Nectarine, 1 each (275 mg).  Nuts: almonds, peanuts, hazelnuts, [place], cashew, mixed, 1 oz (200 mg).  Nuts, pistachios, 1 oz (295 mg).  Orange, 1 each (240 mg).  Orange juice,  cup (235 mg).  Papaya, medium,  fruit (390 mg).  Peanut butter, chunky, 2 Tbsp (240 mg).  Peanut butter, smooth, 2 Tbsp (210 mg).  Pear, 1 medium (200 mg).  Pomegranate, 1 whole (400 mg).  Pomegranate juice,  cup (215 mg).  Pork, 3 oz (350 mg).  Potato chips, salted, 1 oz (465 mg).  Potato, baked with skin, 1 medium (925 mg).  Potatoes, boiled,  cup (255 mg).  Potatoes, mashed,  cup (330 mg).  Prune juice,  cup (370 mg).  Prunes, 5 each (305 mg).  Pudding, chocolate,  cup (230 mg).  Pumpkin, canned,  cup (250  mg).  Raisins, seedless,  cup (270 mg).  Seeds, sunflower or pumpkin, 1 oz (240 mg).  Soy milk, 1 cup (300 mg).  Spinach,  cup (420 mg).  Spinach, canned,  cup (370 mg).  Sweet potato, baked with skin, 1 medium (450 mg).  Swiss chard,  cup (480 mg).  Tomato or vegetable juice,  cup (275 mg).  Tomato sauce or puree,  cup (400-550 mg).  Tomato, raw, 1 medium (290 mg).  Tomatoes, canned,  cup (200-300 mg).  [place], 3 oz (250 mg).  Wheat germ, 1 oz (250 mg).  Winter squash,  cup (250 mg).  Yogurt, plain or fruited, 6 oz (260-435 mg).  Zucchini,  cup (220 mg).  Moderate in potassium The following foods and beverages have 50-200 mg of potassium per serving:  Apple, 1 each (150 mg).  Apple juice,   cup (150 mg).  Applesauce,  cup (90 mg).  Apricot nectar,  cup (140 mg).  Asparagus, small spears,  cup or 6 spears (155 mg).  Bagel, cinnamon raisin, 1 each (130 mg).  Bagel, egg or plain, 4 in., 1 each (70 mg).  Beans, green,  cup (90 mg).  Beans, yellow,  cup (190 mg).  Beer, regular, 12 oz (100 mg).  Beets, canned,  cup (125 mg).  Blackberries,  cup (115 mg).  Blueberries,  cup (60 mg).  Bread, whole wheat, 1 slice (70 mg).  Broccoli, raw,  cup (145 mg).  Cabbage,  cup (150 mg).  Carrots, cooked or raw,  cup (180 mg).  Cauliflower, raw,  cup (150 mg).  Celery, raw,  cup (155 mg).  Cereal, bran flakes, cup (120-150 mg).  Cheese, cottage,  cup (110 mg).  Cherries, 10 each (150 mg).  Chocolate, 1 oz bar (165 mg).  Coffee, brewed 6 oz (90 mg).  Corn,  cup or 1 ear (195 mg).  Cucumbers,  cup (80 mg).  Egg, large, 1 each (60 mg).  Eggplant,  cup (60 mg).  Endive, raw, cup (80 mg).  English muffin, 1 each (65 mg).  Fish, orange roughy, 3 oz (150 mg).  Frankfurter, beef or pork, 1 each (75 mg).  Fruit cocktail,  cup (115 mg).  Grape juice,  cup (170 mg).  Grapefruit,  fruit (175 mg).  Grapes,  cup (155 mg).  Greens: kale, turnip, collard,  cup (110-150 mg).  Ice cream or frozen yogurt, chocolate,  cup (175 mg).  Ice cream or frozen yogurt, vanilla,  cup (120-150 mg).  Lemons, limes, 1 each (80 mg).  Lettuce, all types, 1 cup (100 mg).  Mixed vegetables,  cup (150 mg).  Mushrooms, raw,  cup (110 mg).  Nuts: walnuts, pecans, or macadamia, 1 oz (125 mg).  Oatmeal,  cup (80 mg).  Okra,  cup (110 mg).  Onions, raw,  cup (120 mg).  Peach, 1 each (185 mg).  Peaches, canned,  cup (120 mg).  Pears, canned,  cup (120 mg).  Peas, green, frozen,  cup (90 mg).  Peppers, green,  cup (130 mg).  Peppers, red,  cup (160 mg).  Pineapple juice,  cup (165 mg).  Pineapple, fresh or canned,  cup  (100 mg).  Plums, 1 each (105 mg).  Pudding, vanilla,  cup (150 mg).  Raspberries,  cup (90 mg).  Rhubarb,  cup (115 mg).  Rice, wild,  cup (80 mg).  Shrimp, 3 oz (155 mg).  Spinach, raw, 1 cup (170 mg).  Strawberries,  cup (125 mg).  Summer squash  cup (  175-200 mg).  Swiss chard, raw, 1 cup (135 mg).  Tangerines, 1 each (140 mg).  Tea, brewed, 6 oz (65 mg).  Turnips,  cup (140 mg).  Watermelon,  cup (85 mg).  Wine, red, table, 5 oz (180 mg).  Wine, white, table, 5 oz (100 mg).  Low in potassium The following foods and beverages have less than 50 mg of potassium per serving.  Bread, white, 1 slice (30 mg).  Carbonated beverages, 12 oz (less than 5 mg).  Cheese, 1 oz (20-30 mg).  Cranberries,  cup (45 mg).  Cranberry juice cocktail,  cup (20 mg).  Fats and oils, 1 Tbsp (less than 5 mg).  Hummus, 1 Tbsp (32 mg).  Nectar: papaya, mango, or pear,  cup (35 mg).  Rice, white or brown,  cup (50 mg).  Spaghetti or macaroni,  cup cooked (30 mg).  Tortilla, flour or corn, 1 each (50 mg).  Waffle, 4 in., 1 each (50 mg).  Water chestnuts,  cup (40 mg).  This information is not intended to replace advice given to you by your health care provider. Make sure you discuss any questions you have with your health care provider. Document Released: 06/04/2005 Document Revised: 03/28/2016 Document Reviewed: 09/17/2013 Elsevier Interactive Patient Education  2018 [place] forms  As planned   .    Let us know dates of  absence .   Plan fu after all cardiology  Evaluation is done

## 2018-08-28 NOTE — Progress Notes (Signed)
Chief Complaint  Patient presents with  . Follow-up    Pt seen in ED twice in last 1 month. Still having some left chest pressure. Pt reports that fatigue is not as bad but she still has decreased endurance. Denies SOB    HPI: [name] 52 y.o. come in for fu ed for cp    And neck pain episodes   paionone tight band across chest   Had lasting a week and then     prssure left upper neck chest area and down arm   To elbow  This time no assoc sx   Left side pain pressure   Feels in am .  ocass sharp.  Pressure pain  Upper shoulder   Has seen cards  And planned  Ct angio  Flow scan   Echo showed mild lvh and thickened mv    Ct in ed showed  Some djd no acugte findings   Bp seems controlled on low dose diuretic  And no hives allergic rx  ROS: See pertinent positives and negatives per HPI.  Needs fmla form for time out under these sx and work up   To go to [place] next fall asks about immuniz needed Past Medical History:  Diagnosis Date  . 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   . Allergy   . Anemia   . Asthma   . GERD (gastroesophageal reflux disease)   . Heart murmur   . Hypertension   . Positive TB test   . Shingles   . TOBACCO USE 03/22/2009   Qualifier: Diagnosis of  By: Regis Bill MD, [name]  Stopped Dec 12 2010      Family History  Problem Relation Age of Onset  . Hypertension Mother   . Alzheimer's disease Mother   . Hypertension Father   . Prostate cancer Father   . HIV Brother   . Lupus Sister   . Colon cancer Paternal Grandmother   . Esophageal cancer Neg Hx   . Liver cancer Neg Hx   . Pancreatic cancer Neg Hx   . Rectal cancer Neg Hx   . Stomach cancer Neg Hx     Social History   Socioeconomic History  . Marital status: Divorced    Spouse name: Not on file  . Number of children: Not on file  . Years of education: Not on file  . Highest education level: Not on file  Occupational History  . Not on file  Social Needs  .  Financial resource strain: Not on file  . Food insecurity:    Worry: Not on file    Inability: Not on file  . Transportation needs:    Medical: Not on file    Non-medical: Not on file  Tobacco Use  . Smoking status: Former Smoker    Packs/day: 0.50    Years: 17.00    Pack years: 8.50    Types: Cigarettes    Last attempt to quit: 12/12/2010    Years since quitting: 7.7  . Smokeless tobacco: Never Used  Substance and Sexual Activity  . Alcohol use: No  . Drug use: No  . Sexual activity: Not on file  Lifestyle  . Physical activity:    Days per week: Not on file    Minutes per session: Not on file  . Stress: Not on file  Relationships  . Social connections:    Talks on phone: Not on file    Gets  together: Not on file    Attends religious service: Not on file    Active member of club or organization: Not on file    Attends meetings of clubs or organizations: Not on file    Relationship status: Not on file  Other Topics Concern  . Not on file  Social History Narrative   Divorced    Tobacco since age 36 stopped feb 2012   Works  [employer]ner, broadcasting/film/video.Patient account REP   Now works [employer]     Outpatient Medications Prior to Visit  Medication Sig Dispense Refill  . albuterol (VENTOLIN HFA) 108 (90 Base) MCG/ACT inhaler Inhale 2 puffs into the lungs every 4 (four) hours as needed for wheezing or shortness of breath.    . APPLE CIDER VINEGAR PO Take 2,600 mg by mouth daily.    . budesonide-formoterol (SYMBICORT) 80-4.5 MCG/ACT inhaler INHALE 2 PUFFS INTO THE LUNGS 2 (TWO) TIMES DAILY. 10.2 g 3  . ENSURE (ENSURE) Take 237 mLs by mouth daily.    Marland Kitchen EPINEPHrine 0.15 MG/0.15ML IJ injection Inject 0.15 mLs (0.15 mg total) into the muscle as needed for anaphylaxis. 2 Device 1  . fluticasone (FLONASE) 50 MCG/ACT nasal spray Place 2 sprays into both nostrils daily.    Marland Kitchen ibuprofen (ADVIL,MOTRIN) 800 MG tablet 1 po very 8-12 hours  For 10 days  Or as needed (Patient  taking differently: Take 800 mg by mouth daily as needed for mild pain. 1 po very 8-12 hours  For 10 days  Or as needed) 40 tablet 1  . meloxicam (MOBIC) 15 MG tablet Take 1 tablet (15 mg total) by mouth daily. Take 1 daily with food. 10 tablet 0  . methocarbamol (ROBAXIN) 500 MG tablet Take 1-2 tablets (500-1,000 mg total) by mouth every 8 (eight) hours as needed for muscle spasms. 30 tablet 0  . omeprazole (PRILOSEC) 20 MG capsule Take 1 capsule (20 mg total) by mouth daily. 30 capsule 0  . pantoprazole (PROTONIX) 40 MG tablet TAKE ONE TABLET BY MOUTH 30-60 MINUTES BEFORE YOUR FIRST AND LAST MEALS 60 tablet 2  . valsartan (DIOVAN) 160 MG tablet Take 1 tablet (160 mg total) by mouth daily. 30 tablet 1  . hydrochlorothiazide (HYDRODIURIL) 25 MG tablet Take 1 tablet (25 mg total) by mouth daily. 30 tablet 0  . metoprolol tartrate (LOPRESSOR) 50 MG tablet Take 1 tablet (50 mg total) by mouth once for 1 dose. Take 1 tablet 1 hour prior to your Coronary CT 1 tablet 0   No facility-administered medications prior to visit.      EXAM:  BP 118/88 (BP Location: Right Arm, Patient Position: Sitting, Cuff Size: Normal)   Pulse 68   Temp 98.3 F (36.8 C) (Oral)   Wt 143 lb (64.9 kg)   SpO2 97%   BMI 27.93 kg/m   Body mass index is 27.93 kg/m.  GENERAL: vitals reviewed and listed above, alert, oriented, appears well hydrated and in no acute distress HEENT: atraumatic, conjunctiva  clear, no obvious abnormalities on inspection of external nose and ears  NECK: no obvious masses on inspection palpation  No   Mass  Neg mid line pain  LUNGS: clear to auscultation bilaterally, no wheezes, rales or rhonchi, good air movement CV: HRRR, no clubbing cyanosis or  peripheral edema nl cap refill  MS: moves all extremities without noticeable focal  Abnormality neuro non focal ue dtrs etc .  PSYCH: pleasant and cooperative, no obvious depression or anxiety Lab  Results  Component Value Date   WBC 5.7  08/19/2018   HGB 12.3 08/19/2018   HCT 39.3 08/19/2018   PLT 311 08/19/2018   GLUCOSE 114 (H) 08/19/2018   CHOL 195 05/19/2018   TRIG 41.0 05/19/2018   HDL 83.80 05/19/2018   LDLCALC 103 (H) 05/19/2018   ALT 10 05/19/2018   AST 17 05/19/2018   NA 141 08/19/2018   K 3.2 (L) 08/19/2018   CL 105 08/19/2018   CREATININE 0.96 08/19/2018   BUN 20 08/19/2018   CO2 27 08/19/2018   TSH 0.72 05/19/2018   MICROALBUR 0.7 05/30/2009   BP Readings from Last 3 Encounters:  08/28/18 118/88  08/19/18 121/89  08/03/18 130/88   Ed reviewe  ASSESSMENT AND PLAN:  Discussed the following assessment and plan:  Neck pain on left side  Medication management - Plan: Basic metabolic panel, Magnesium  Hypokalemia - Plan: Basic metabolic panel, Magnesium  Essential hypertension  Chest pain, atypical  Need for influenza vaccination - Plan: Flu Vaccine QUAD 6+ mos PF IM (Fluarix Quad PF)  Potassium Ht  Addressed   Poss neck atypical djdreferred   At risk agree to get further   cv evaluation  In novemnber .  Travel advice per =[facility] web site etc.   fmla get [place] dates etc  To help with a form .   Plan fu after cards evaluation   Or if alarm sx in interim   Bmp in a few weeks  If needed witll add medication -Patient advised to return or notify health care team  if  new concerns arise.  Patient Instructions  Inc potassium risk foods and then recheck  BMP in 3 weeks   This could be  Related to degenerative  Disc diseae although atypical as discussed . Reassuring that labs were negative in the ed.   Potassium Content of Foods Potassium is a mineral found in many foods and drinks. It helps keep fluids and minerals balanced in your body and affects how steadily your heart beats. Potassium also helps control your blood pressure and keep your muscles and nervous system healthy. Certain health conditions and medicines may change the balance of potassium in your body. When this happens, you can  help balance your level of potassium through the foods that you do or do not eat. Your health care provider or dietitian may recommend an amount of potassium that you should have each day. The following lists of foods provide the amount of potassium (in parentheses) per serving in each item. High in potassium The following foods and beverages have 200 mg or more of potassium per serving:  Apricots, 2 raw or 5 dry (200 mg).  Artichoke, 1 medium (345 mg).  Avocado, raw,  each (245 mg).  Banana, 1 medium (425 mg).  Beans, lima, or baked beans, canned,  cup (280 mg).  Beans, white, canned,  cup (595 mg).  Beef roast, 3 oz (320 mg).  Beef, ground, 3 oz (270 mg).  Beets, raw or cooked,  cup (260 mg).  Bran muffin, 2 oz (300 mg).  Broccoli,  cup (230 mg).  Brussels sprouts,  cup (250 mg).  Cantaloupe,  cup (215 mg).  Cereal, 100% bran,  cup (200-400 mg).  Cheeseburger, single, fast food, 1 each (225-400 mg).  Chicken, 3 oz (220 mg).  Clams, canned, 3 oz (535 mg).  Crab, 3 oz (225 mg).  Dates, 5 each (270 mg).  Dried beans and peas,  cup (300-475 mg).  Figs, dried, 2 each (260 mg).  Fish: halibut, tuna, cod, snapper, 3 oz (480 mg).  Fish: salmon, haddock, swordfish, perch, 3 oz (300 mg).  Fish, tuna, canned 3 oz (200 mg).  [place] fries, fast food, 3 oz (470 mg).  Granola with fruit and nuts,  cup (200 mg).  Grapefruit juice,  cup (200 mg).  Greens, beet,  cup (655 mg).  Honeydew melon,  cup (200 mg).  Kale, raw, 1 cup (300 mg).  Kiwi, 1 medium (240 mg).  Kohlrabi, rutabaga, parsnips,  cup (280 mg).  Lentils,  cup (365 mg).  Mango, 1 each (325 mg).  Milk, chocolate, 1 cup (420 mg).  Milk: nonfat, low-fat, whole, buttermilk, 1 cup (350-380 mg).  Molasses, 1 Tbsp (295 mg).  Mushrooms,  cup (280) mg.  Nectarine, 1 each (275 mg).  Nuts: almonds, peanuts, hazelnuts, [place], cashew, mixed, 1 oz (200 mg).  Nuts, pistachios, 1 oz (295  mg).  Orange, 1 each (240 mg).  Orange juice,  cup (235 mg).  Papaya, medium,  fruit (390 mg).  Peanut butter, chunky, 2 Tbsp (240 mg).  Peanut butter, smooth, 2 Tbsp (210 mg).  Pear, 1 medium (200 mg).  Pomegranate, 1 whole (400 mg).  Pomegranate juice,  cup (215 mg).  Pork, 3 oz (350 mg).  Potato chips, salted, 1 oz (465 mg).  Potato, baked with skin, 1 medium (925 mg).  Potatoes, boiled,  cup (255 mg).  Potatoes, mashed,  cup (330 mg).  Prune juice,  cup (370 mg).  Prunes, 5 each (305 mg).  Pudding, chocolate,  cup (230 mg).  Pumpkin, canned,  cup (250 mg).  Raisins, seedless,  cup (270 mg).  Seeds, sunflower or pumpkin, 1 oz (240 mg).  Soy milk, 1 cup (300 mg).  Spinach,  cup (420 mg).  Spinach, canned,  cup (370 mg).  Sweet potato, baked with skin, 1 medium (450 mg).  Swiss chard,  cup (480 mg).  Tomato or vegetable juice,  cup (275 mg).  Tomato sauce or puree,  cup (400-550 mg).  Tomato, raw, 1 medium (290 mg).  Tomatoes, canned,  cup (200-300 mg).  [place], 3 oz (250 mg).  Wheat germ, 1 oz (250 mg).  Winter squash,  cup (250 mg).  Yogurt, plain or fruited, 6 oz (260-435 mg).  Zucchini,  cup (220 mg).  Moderate in potassium The following foods and beverages have 50-200 mg of potassium per serving:  Apple, 1 each (150 mg).  Apple juice,  cup (150 mg).  Applesauce,  cup (90 mg).  Apricot nectar,  cup (140 mg).  Asparagus, small spears,  cup or 6 spears (155 mg).  Bagel, cinnamon raisin, 1 each (130 mg).  Bagel, egg or plain, 4 in., 1 each (70 mg).  Beans, green,  cup (90 mg).  Beans, yellow,  cup (190 mg).  Beer, regular, 12 oz (100 mg).  Beets, canned,  cup (125 mg).  Blackberries,  cup (115 mg).  Blueberries,  cup (60 mg).  Bread, whole wheat, 1 slice (70 mg).  Broccoli, raw,  cup (145 mg).  Cabbage,  cup (150 mg).  Carrots, cooked or raw,  cup (180 mg).  Cauliflower, raw,  cup  (150 mg).  Celery, raw,  cup (155 mg).  Cereal, bran flakes, cup (120-150 mg).  Cheese, cottage,  cup (110 mg).  Cherries, 10 each (150 mg).  Chocolate, 1 oz bar (165 mg).  Coffee, brewed 6 oz (90 mg).  Corn,  cup or 1 ear (195  mg).  Cucumbers,  cup (80 mg).  Egg, large, 1 each (60 mg).  Eggplant,  cup (60 mg).  Endive, raw, cup (80 mg).  English muffin, 1 each (65 mg).  Fish, orange roughy, 3 oz (150 mg).  Frankfurter, beef or pork, 1 each (75 mg).  Fruit cocktail,  cup (115 mg).  Grape juice,  cup (170 mg).  Grapefruit,  fruit (175 mg).  Grapes,  cup (155 mg).  Greens: kale, turnip, collard,  cup (110-150 mg).  Ice cream or frozen yogurt, chocolate,  cup (175 mg).  Ice cream or frozen yogurt, vanilla,  cup (120-150 mg).  Lemons, limes, 1 each (80 mg).  Lettuce, all types, 1 cup (100 mg).  Mixed vegetables,  cup (150 mg).  Mushrooms, raw,  cup (110 mg).  Nuts: walnuts, pecans, or macadamia, 1 oz (125 mg).  Oatmeal,  cup (80 mg).  Okra,  cup (110 mg).  Onions, raw,  cup (120 mg).  Peach, 1 each (185 mg).  Peaches, canned,  cup (120 mg).  Pears, canned,  cup (120 mg).  Peas, green, frozen,  cup (90 mg).  Peppers, green,  cup (130 mg).  Peppers, red,  cup (160 mg).  Pineapple juice,  cup (165 mg).  Pineapple, fresh or canned,  cup (100 mg).  Plums, 1 each (105 mg).  Pudding, vanilla,  cup (150 mg).  Raspberries,  cup (90 mg).  Rhubarb,  cup (115 mg).  Rice, wild,  cup (80 mg).  Shrimp, 3 oz (155 mg).  Spinach, raw, 1 cup (170 mg).  Strawberries,  cup (125 mg).  Summer squash  cup (175-200 mg).  Swiss chard, raw, 1 cup (135 mg).  Tangerines, 1 each (140 mg).  Tea, brewed, 6 oz (65 mg).  Turnips,  cup (140 mg).  Watermelon,  cup (85 mg).  Wine, red, table, 5 oz (180 mg).  Wine, white, table, 5 oz (100 mg).  Low in potassium The following foods and beverages have less than 50 mg  of potassium per serving.  Bread, white, 1 slice (30 mg).  Carbonated beverages, 12 oz (less than 5 mg).  Cheese, 1 oz (20-30 mg).  Cranberries,  cup (45 mg).  Cranberry juice cocktail,  cup (20 mg).  Fats and oils, 1 Tbsp (less than 5 mg).  Hummus, 1 Tbsp (32 mg).  Nectar: papaya, mango, or pear,  cup (35 mg).  Rice, white or brown,  cup (50 mg).  Spaghetti or macaroni,  cup cooked (30 mg).  Tortilla, flour or corn, 1 each (50 mg).  Waffle, 4 in., 1 each (50 mg).  Water chestnuts,  cup (40 mg).  This information is not intended to replace advice given to you by your health care provider. Make sure you discuss any questions you have with your health care provider. Document Released: 06/04/2005 Document Revised: 03/28/2016 Document Reviewed: 09/17/2013 Elsevier Interactive Patient Education  2018 [place] forms  As planned   .    Let us know dates of  absence .   Plan fu after all cardiology  Evaluation is done     Ponemah K. Sakiya Stepka M.D.

## 2018-09-10 MED FILL — SYMBICORT 80-4.5 MCG INH: 80-4.5 | 30 days supply | Qty: 10 | Fill #2

## 2018-09-16 ENCOUNTER — Ambulatory Visit (HOSPITAL_COMMUNITY)
Admission: RE | Admit: 2018-09-16 | Discharge: 2018-09-16 | Disposition: A | Discharge status: Home / Self Care | Payer: BLUE CROSS/BLUE SHIELD | Source: Ambulatory Visit | Attending: Cardiology | Admitting: Cardiology

## 2018-09-16 ENCOUNTER — Ambulatory Visit (HOSPITAL_COMMUNITY): Payer: BLUE CROSS/BLUE SHIELD

## 2018-09-16 DIAGNOSIS — R918 Other nonspecific abnormal finding of lung field: Secondary | ICD-10-CM | POA: Diagnosis not present

## 2018-09-16 DIAGNOSIS — I10 Essential (primary) hypertension: Secondary | ICD-10-CM | POA: Diagnosis not present

## 2018-09-16 DIAGNOSIS — R079 Chest pain, unspecified: Secondary | ICD-10-CM | POA: Diagnosis not present

## 2018-09-16 MED ORDER — METOPROLOL TARTRATE 5 MG/5ML IV SOLN
5.0000 mg | INTRAVENOUS | Status: DC | PRN
  Filled 2018-09-16: qty 5

## 2018-09-16 MED ORDER — NITROGLYCERIN 0.4 MG SL SUBL
0.8000 mg | SUBLINGUAL_TABLET | Freq: Once | SUBLINGUAL | Status: AC
  Administered 2018-09-16: 0.8 mg via SUBLINGUAL
  Filled 2018-09-16: qty 25

## 2018-09-16 MED ORDER — NITROGLYCERIN 0.4 MG SL SUBL
SUBLINGUAL_TABLET | SUBLINGUAL | Status: AC
  Filled 2018-09-16: qty 2

## 2018-09-16 MED ORDER — IOPAMIDOL (ISOVUE-370) INJECTION 76%
100.0000 mL | Freq: Once | INTRAVENOUS | Status: AC | PRN
  Administered 2018-09-16: 80 mL via INTRAVENOUS

## 2018-10-02 ENCOUNTER — Other Ambulatory Visit: Payer: Self-pay | Admitting: Internal Medicine

## 2018-10-06 MED FILL — VALSARTAN 160 MG TABLET: 160 | 30 days supply | Qty: 30 | Fill #0

## 2018-10-13 MED FILL — SYMBICORT 80-4.5 MCG INH: 80-4.5 | 30 days supply | Qty: 10 | Fill #3

## 2018-10-23 ENCOUNTER — Ambulatory Visit: Payer: BLUE CROSS/BLUE SHIELD | Admitting: Internal Medicine

## 2018-11-11 ENCOUNTER — Ambulatory Visit (INDEPENDENT_AMBULATORY_CARE_PROVIDER_SITE_OTHER): Payer: BLUE CROSS/BLUE SHIELD | Admitting: Internal Medicine

## 2018-11-11 ENCOUNTER — Encounter: Payer: Self-pay | Admitting: Internal Medicine

## 2018-11-11 DIAGNOSIS — K219 Gastro-esophageal reflux disease without esophagitis: Secondary | ICD-10-CM

## 2018-11-11 DIAGNOSIS — J45991 Cough variant asthma: Secondary | ICD-10-CM | POA: Diagnosis not present

## 2018-11-11 MED ORDER — FAMOTIDINE 20 MG PO TABS: ORAL_TABLET | ORAL | 11 refills | Status: DC

## 2018-11-11 NOTE — Assessment & Plan Note (Signed)
This is a clinical dx only so hope to avoid committing to longterm empirical rx here  Discussed the recent press about ppi's in the context of a statistically significant (but questionably clinically relevant) increase in CRI in pts on ppi vs h2's > bottom line is the lowest dose of ppi that controls   gerd is the right dose and if that dose is zero that's fine esp since h2's are cheaper.     If can't wean off ppi s flare of GI symptoms or cough/wheeze/sob then prefer GI eval in such a young pt.     I had an extended discussion with the patient reviewing all relevant studies completed to date and  lasting 15 to 20 minutes of a 25 minute visit    See device teaching which extended face to face time for this visit.  Each maintenance medication was reviewed in detail including emphasizing most importantly the difference between maintenance and prns and under what circumstances the prns are to be triggered using an action plan format that is not reflected in the computer generated alphabetically organized AVS which I have not found useful in most complex patients, especially with respiratory illnesses  Please see AVS for specific instructions unique to this visit that I personally wrote and verbalized to the the pt in detail and then reviewed with pt  by my nurse highlighting any  changes in therapy recommended at today's visit to their plan of care.

## 2018-11-11 NOTE — Patient Instructions (Signed)
Change to Pantoprazole (protonix) 40 mg   Take  30-60 min before first meal of the day and Pepcid (famotidine)  20 mg one after supper x 2 weeks then stop protonix and take pepcid 20 mg after bfast and supper x 2 weeks then just after supper x 2 weeks then stop and call if cough or breathing get worse with taper    Please schedule a follow up visit in 6 months but call sooner if needed

## 2018-11-11 NOTE — Progress Notes (Signed)
Subjective:    Patient ID: [name], female    DOB: 1966-07-31, 53 y.o.   MRN: [id number]    Brief patient profile:  73  yobf  Quit smoking 2012 with onset of variable sob 2010 but nl pfts while on symbicort 02/20/11    History of Present Illness  January 08, 2011 ov cc worse dyspnea, wheezing no cough x 2 weeks no better p inalers, feels something stuck in ssn but swallowing ok. general chest tightness not better with saba.  Patient Instructions:  1) Work on inhaler technique: relax and blow all the way out then take a nice smooth deep breath back in, triggering the inhaler at same time you start breathing in  2) Symbicort 2 puffs first thing in am and 2 puffs again in pm about 12 hours later  3) Change nexium Take one 30-60 min before first meal of the day and add pepcid 20 mg one at bedtime  4) GERD (REFLUX)      02/20/2011 ov / [name]   Cc breathing some better on symbicort but still having episodes of chest tightness lasting form a min to an hour once or twice weekly.  Overall much better vs baseline, no noct symptoms or need for saba, much less sense of something stuck in back of throat, no cough.   rec No change rx> f/u prn        02/12/2018  f/u ov/[name] re: ? Flare of asthma  Not even 50% better p er rx x 2 in Feb 2019  Chief Complaint  Patient presents with  . Pulmonary Consult    Self referral.  She c/o SOB x 3 months "all the time"- with or without any exertion. She states when she lies down she hears "crackling noise".  She also c/o non prod cough- usually worse at night and can be triggered by cleaning products. She has been using proair 3-4 x per day, but none in the past wk.   Dyspnea:  Variably sob with exertion / prior to Nov 04 2017 was fine with good aerobic capacity consistently fast walk up hills off maint rx x years on otc nexium only bedtime and variably bad breath/ hb on this rx  Cough: 30 m to an hour p hs/ not as common later or early am /worse with exp to  clearing products Sleep: most nights ok SABA use:  Way over using now where wasn't using anything prior to Nov 04 2017 and not helping rec Plan A = Automatic = Symbicort 80 Take 2 puffs first thing in am and then another 2 puffs about 12 hours later.  Work on inhaler technique:  Plan B = Backup Only use your albuterol(Proventil/Proair/Ventolin)  as a rescue medication Protonix Take 30- 60 min before your first and last meals of the day  GERD diet   03/19/2018  f/u ov/[name] re:  S/p asthma flare, back in control  Chief Complaint  Patient presents with  . Follow-up    Breathing is much improved and cough has resolved. She has only had to use her albuterol inhaler once since her last visit.    Dyspnea:  Ok including steps  Cough: no Sleep: ok  SABA use:  Only x one  rec Please schedule a follow up visit in 6  months but call sooner if needed  Continue the reflux diet Try off protonix and take over the counter  pepcid 20 mg after bfast and supper x 1 week then just  after bfast x one week then off pepcid and if can't taper off then let your Primary care doctor know for purposes of refilling the protonix or taking nexium as needed    05/26/2018 acute extended ov/[name] re: cough cosb  Chief Complaint  Patient presents with  . Acute Visit    asthma flare, cough, SOB, had to call the ambulance to her job for an asthma attack , ran out of symbicort which may have caused asthma flare, just started protonix again  tapered off the protonix and maintained on pepcid but started cough/ rattling breathing w/in a week of the change and doe x steps despite maint on symb 80 2bid so refilled ppi bid ac as of 05/18/18 and yet no improvement initially/ some better on day of ov until starts dry  coughing fits Had improved to not needing any albuterol prior to above flare  Since onset of flare having trouble lying flat so props up on 5-6 pillows with freq noct awakening only some better p saba.  rec Plan A =  Automatic = Symbicort 80 Take 2 puffs first thing in am and then another 2 puffs about 12 hours later.  Continue protonix 40 mg Take 30- 60 min before your first and last meals of the day  Plan B = Backup Only use your albuterol as a rescue medication Take delsym two tsp every 12 hours and supplement if needed with  tramadol 50 mg up to 2 every 4 hours Once you have eliminated the cough for 3 straight days try reducing the tramadol first,  then the delsym as tolerated.   For drainage / throat tickle try take CHLORPHENIRAMINE  4 mg -  Please schedule a follow up office visit in 4 weeks, call sooner if needed with all medications /inhalers/ solutions in hand  >  /22/19  NP eval/rx med calendar     11/11/2018  f/u ov/[name] re:  Chief Complaint  Patient presents with  . Follow-up    Breathing is doing well. She rarely uses her albuterol inhaler.   Dyspnea:  Not limited by breathing from desired activities   Cough: none Sleeping: fine flat SABA use: very rare 02: none   No obvious day to day or daytime variability or assoc excess/ purulent sputum or mucus plugs or hemoptysis or cp or chest tightness, subjective wheeze or overt sinus or hb symptoms.   Sleeping as above without nocturnal  or early am exacerbation  of respiratory  c/o's or need for noct saba. Also denies any obvious fluctuation of symptoms with weather or environmental changes or other aggravating or alleviating factors except as outlined above   No unusual exposure hx or h/o childhood pna/ asthma or knowledge of premature birth.  Current Allergies, Complete Past Medical History, Past Surgical History, Family History, and Social History were reviewed in [facility] record.  ROS  The following are not active complaints unless bolded Hoarseness, sore throat, dysphagia, dental problems, itching, sneezing,  nasal congestion or discharge of excess mucus or purulent secretions, ear ache,   fever, chills, sweats,  unintended wt loss or wt gain, classically pleuritic or exertional cp,  orthopnea pnd or arm/hand swelling  or leg swelling, presyncope, palpitations, abdominal pain, anorexia, nausea, vomiting, diarrhea  or change in bowel habits or change in bladder habits, change in stools or change in urine, dysuria, hematuria,  rash, arthralgias, visual complaints, headache, numbness, weakness or ataxia or problems with walking or coordination,  change in mood  or  memory.        Current Meds  Medication Sig  . albuterol (VENTOLIN HFA) 108 (90 Base) MCG/ACT inhaler Inhale 2 puffs into the lungs every 4 (four) hours as needed for wheezing or shortness of breath.  . APPLE CIDER VINEGAR PO Take 2,600 mg by mouth daily.  . budesonide-formoterol (SYMBICORT) 80-4.5 MCG/ACT inhaler INHALE 2 PUFFS INTO THE LUNGS 2 (TWO) TIMES DAILY.  Marland Kitchen EPINEPHrine 0.15 MG/0.15ML IJ injection Inject 0.15 mLs (0.15 mg total) into the muscle as needed for anaphylaxis.  . fluticasone (FLONASE) 50 MCG/ACT nasal spray Place 2 sprays into both nostrils daily.  . hydrochlorothiazide (HYDRODIURIL) 25 MG tablet Take 1 tablet (25 mg total) by mouth daily.  Marland Kitchen ibuprofen (ADVIL,MOTRIN) 800 MG tablet 1 po very 8-12 hours  For 10 days  Or as needed (Patient taking differently: Take 800 mg by mouth daily as needed for mild pain. 1 po very 8-12 hours  For 10 days  Or as needed)  . meloxicam (MOBIC) 15 MG tablet Take 1 tablet (15 mg total) by mouth daily. Take 1 daily with food.  . methocarbamol (ROBAXIN) 500 MG tablet Take 1-2 tablets (500-1,000 mg total) by mouth every 8 (eight) hours as needed for muscle spasms.  . metoprolol tartrate (LOPRESSOR) 50 MG tablet Take 1 tablet (50 mg total) by mouth once for 1 dose. Take 1 tablet 1 hour prior to your Coronary CT  . pantoprazole (PROTONIX) 40 MG tablet TAKE ONE TABLET BY MOUTH 30-60 MINUTES BEFORE YOUR FIRST AND LAST MEALS  . 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 Quit 12/12/10. Smoked for approx 10 yrs up to 1/4 ppd  Alcohol use-yes  employed [employer]               Objective:   Physical Exam   amb bf nad    wt   152 January 08, 2011 > 149  02/20/11 > 02/12/2018  133 > 03/19/2018  138 > 05/26/2018   143 > 11/11/2018   141    Vital signs reviewed - Note on arrival 02 sats  100% on RA            HEENT: nl dentition, turbinates bilaterally, and oropharynx. Nl external ear canals without cough reflex   NECK :  without JVD/Nodes/TM/ nl carotid upstrokes bilaterally   LUNGS: no acc muscle use,  Nl contour chest which is clear to A and P bilaterally without cough on insp or exp maneuvers   CV:  RRR  no s3 or murmur or increase in P2, and no edema   ABD:  soft and nontender with nl inspiratory excursion in the supine position. No bruits or organomegaly appreciated, bowel sounds nl  MS:  Nl gait/ ext warm without deformities, calf tenderness, cyanosis or clubbing No obvious joint restrictions   SKIN: warm and dry without lesions    NEURO:  alert, approp, nl sensorium with  no motor  or cerebellar deficits apparent.            Assessment & Plan:

## 2018-11-11 NOTE — Assessment & Plan Note (Addendum)
-  PFT's wnl 02/20/11  While on symbicort with improved symptom control > f/u pulm prn - FENO 02/12/2018  =   61 on symb 80 2bid with poor hfa - Spirometry 02/12/2018  Nl   - 02/12/2018    continue symb 80 2bid and add max gerd rx  - Allergy profile 02/12/18  >  Eos 0.4 /  IgE  50  RAST Pos grass > dog  - 03/19/2018 try to taper off gerd rx > flared 05/2018 so resumed - FENO 05/26/2018  =   25 on symb 80 2bid   -  11/11/2018  After extensive coaching inhaler device,  effectiveness =    90%   All goals of chronic asthma control met including optimal function and elimination of symptoms with minimal need for rescue therapy.  Contingencies discussed in full including contacting this office immediately if not controlling the symptoms using the rule of two's.     >>>  F/u can be q 6 m 

## 2018-11-13 DIAGNOSIS — Z1231 Encounter for screening mammogram for malignant neoplasm of breast: Secondary | ICD-10-CM | POA: Diagnosis not present

## 2018-11-20 ENCOUNTER — Other Ambulatory Visit: Payer: Self-pay | Admitting: Internal Medicine

## 2018-11-20 MED FILL — VALSARTAN 160 MG TABLET: 160 | 30 days supply | Qty: 30 | Fill #1

## 2018-11-20 MED FILL — SYMBICORT 80-4.5 MCG INH: 80-4.5 | 30 days supply | Qty: 10 | Fill #0

## 2019-01-05 MED FILL — SYMBICORT 80-4.5 MCG INH: 80-4.5 | 30 days supply | Qty: 10 | Fill #1

## 2019-01-05 MED FILL — VALSARTAN 160 MG TABLET: 160 | 30 days supply | Qty: 30 | Fill #2

## 2019-01-22 MED FILL — SYMBICORT 80-4.5 MCG INH: 80-4.5 | 30 days supply | Qty: 10 | Fill #2

## 2019-02-08 ENCOUNTER — Other Ambulatory Visit: Payer: Self-pay | Admitting: Internal Medicine

## 2019-02-08 MED FILL — VALSARTAN 80 MG TABLET: 80 | 30 days supply | Qty: 60 | Fill #0

## 2019-02-09 ENCOUNTER — Telehealth: Payer: Self-pay | Admitting: Internal Medicine

## 2019-02-09 NOTE — Telephone Encounter (Unsigned)
Copied from [place] [id number]. Topic: Quick Communication - Rx Refill/Question >> Feb 09, 2019  4:10 PM Alanda Slim E wrote: Medication: Pt was sent valsartan (DIOVAN) 160 MG tablet to the pharmacy. The pharmacy gave the Pt 80MG  tablets and told her to take two/ Pt is calling due to her concern with having reactions to certain medications and the brand DIOVAN. Pt stated that the pills look different and wanted to make sure it was ok for her to take them and why they look different from the normal pills shes been getting/ I advised her to ask pharmacist and that someone from office would call her/ please advise

## 2019-02-10 NOTE — Telephone Encounter (Signed)
Ok to take what they gave you 2 of the 80 mg   You were on a different formulation when you got the reaction .   If you get a symptom f concern let us and the pharmacy know .

## 2019-02-10 NOTE — Telephone Encounter (Signed)
Pt has been informed.

## 2019-02-19 ENCOUNTER — Encounter: Payer: Self-pay | Admitting: Internal Medicine

## 2019-02-19 ENCOUNTER — Other Ambulatory Visit: Payer: Self-pay

## 2019-02-19 ENCOUNTER — Ambulatory Visit (INDEPENDENT_AMBULATORY_CARE_PROVIDER_SITE_OTHER): Payer: BLUE CROSS/BLUE SHIELD | Admitting: Internal Medicine

## 2019-02-19 DIAGNOSIS — J45991 Cough variant asthma: Secondary | ICD-10-CM

## 2019-02-19 DIAGNOSIS — Z79899 Other long term (current) drug therapy: Secondary | ICD-10-CM

## 2019-02-19 DIAGNOSIS — I10 Essential (primary) hypertension: Secondary | ICD-10-CM

## 2019-02-19 MED ORDER — SPACER/AERO-HOLDING CHAMBERS DEVI: 0 refills | Status: DC

## 2019-02-19 NOTE — Patient Instructions (Signed)
Letter send as well as  Spacer  rx foto go to [facility]

## 2019-02-19 NOTE — Progress Notes (Signed)
Virtual Visit via Video Note  I connected with@ on 02/19/19 at  1:45 PM EDT by a video enabled telemedicine application and verified that I am speaking with the correct person using two identifiers. Location patient: home Location provider:work  office Persons participating in the virtual visit: patient, provider  [place] national recommendations  regarding COVID 19 pandemic   video visit is advised over in office visit for this patient.  Discussed the limitations of evaluation and management by telemedicine and  availability of in person appointments. The patient expressed understanding and agreed to proceed.   HPI: [name] Presents to day for  Vv because of concern about her high risk condition at work.  She had an asthma flare last Friday at work and had to use rescue x 3  But ok now.   Works [employer]  And supervisor suggested a letter documenting high risk condition .  Somewhat separated but  Rotating shifts and concerned   She is the only one known to have higler risk situation  bp seems to be doing fine on current  Pills and  New  Ones seem to work ok .  bp has been in 120 range etc   Asks if should benefit from nebulizer   ROS: See pertinent positives and negatives per HPI.  Past Medical History:  Diagnosis Date  . 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   . Allergy   . Anemia   . Asthma   . GERD (gastroesophageal reflux disease)   . Heart murmur   . Hypertension   . Positive TB test   . Shingles   . TOBACCO USE 03/22/2009   Qualifier: Diagnosis of  By: Regis Bill MD, [name]  Stopped Dec 12 2010      Past Surgical History:  Procedure Laterality Date  . ABDOMINAL HYSTERECTOMY    . BREAST SURGERY     cyst from left breast  . BUNIONECTOMY     both feet  . CHOLECYSTECTOMY    . CYSTECTOMY     left shoulder  . KNEE SURGERY Left   . PANENDOSCOPY    . TUBAL LIGATION    . uterine ablastion     and polyps removed  . Uterine polyps      Family  History  Problem Relation Age of Onset  . Hypertension Mother   . Alzheimer's disease Mother   . Hypertension Father   . Prostate cancer Father   . HIV Brother   . Lupus Sister   . Colon cancer Paternal Grandmother   . Esophageal cancer Neg Hx   . Liver cancer Neg Hx   . Pancreatic cancer Neg Hx   . Rectal cancer Neg Hx   . Stomach cancer Neg Hx     Social History   Tobacco Use  . Smoking status: Former Smoker    Packs/day: 0.50    Years: 17.00    Pack years: 8.50    Types: Cigarettes    Last attempt to quit: 12/12/2010    Years since quitting: 8.1  . Smokeless tobacco: Never Used  Substance Use Topics  . Alcohol use: No  . Drug use: No      Current Outpatient Medications:  .  albuterol (VENTOLIN HFA) 108 (90 Base) MCG/ACT inhaler, Inhale 2 puffs into the lungs every 4 (four) hours as needed for wheezing or shortness of breath., Disp: , Rfl:  .  APPLE CIDER VINEGAR PO, Take 2,600 mg by mouth  daily., Disp: , Rfl:  .  budesonide-formoterol (SYMBICORT) 80-4.5 MCG/ACT inhaler, INHALE 2 PUFFS BY MOUTH INTO THE LUNGS TWICE DAILY, Disp: 10.2 g, Rfl: 11 .  EPINEPHrine 0.15 MG/0.15ML IJ injection, Inject 0.15 mLs (0.15 mg total) into the muscle as needed for anaphylaxis., Disp: 2 Device, Rfl: 1 .  famotidine (PEPCID) 20 MG tablet, One at bedtime, Disp: 60 tablet, Rfl: 11 .  fluticasone (FLONASE) 50 MCG/ACT nasal spray, Place 2 sprays into both nostrils daily., Disp: , Rfl:  .  hydrochlorothiazide (HYDRODIURIL) 25 MG tablet, Take 1 tablet (25 mg total) by mouth daily., Disp: 30 tablet, Rfl: 4 .  ibuprofen (ADVIL,MOTRIN) 800 MG tablet, 1 po very 8-12 hours  For 10 days  Or as needed (Patient taking differently: Take 800 mg by mouth daily as needed for mild pain. 1 po very 8-12 hours  For 10 days  Or as needed), Disp: 40 tablet, Rfl: 1 .  meloxicam (MOBIC) 15 MG tablet, Take 1 tablet (15 mg total) by mouth daily. Take 1 daily with food., Disp: 10 tablet, Rfl: 0 .  methocarbamol  (ROBAXIN) 500 MG tablet, Take 1-2 tablets (500-1,000 mg total) by mouth every 8 (eight) hours as needed for muscle spasms., Disp: 30 tablet, Rfl: 0 .  metoprolol tartrate (LOPRESSOR) 50 MG tablet, Take 1 tablet (50 mg total) by mouth once for 1 dose. Take 1 tablet 1 hour prior to your Coronary CT, Disp: 1 tablet, Rfl: 0 .  Spacer/Aero-Holding Dorise Bullion, Use with HFA inhaler albuterol for asthma., Disp: 1 each, Rfl: 0 .  valsartan (DIOVAN) 160 MG tablet, TAKE 1 TABLET BY MOUTH ONCE DAILY, Disp: 30 tablet, Rfl: 2  EXAM: BP Readings from Last 3 Encounters:  11/11/18 104/70  09/16/18 126/90  08/28/18 118/88    VITALS per patient if applicable:  GENERAL: alert, oriented, appears well and in no acute distress  HEENT: atraumatic, conjunttiva clear, no obvious abnormalities on inspection of external nose and ears  NECK: normal movements of the head and neck  LUNGS: on inspection no signs of respiratory distress, breathing rate appears normal, no obvious gross SOB, gasping or wheezing  CV: no obvious cyanosis  MS: moves all visible extremities without noticeable abnormality  PSYCH/NEURO: pleasant and cooperative, no obvious depression or anxiety, speech and thought processing grossly intact   ASSESSMENT AND PLAN:  Discussed the following assessment and plan:  Cough variant asthma - recent flare concern  about work exposures  see  notes  Essential hypertension - with some lvh  controlled per patient   Medication management Asks about nebulizer and advise  Spacer may be better .  And usually avoid nebs at home  Letter written and sent via my chart   for high risk conditions  HT with lvh and asthma .     Counseled.   Expectant management and discussion of plan and treatment with patient with opportunity to ask questions and all were answered. The patient agreed with the plan and demonstrated an understanding of the instructions.   The patient was advised to call back if  having  concerns .   Shanon Ace, MD

## 2019-02-24 ENCOUNTER — Other Ambulatory Visit: Payer: Self-pay | Admitting: Internal Medicine

## 2019-03-15 ENCOUNTER — Encounter: Payer: Self-pay | Admitting: Internal Medicine

## 2019-03-15 ENCOUNTER — Other Ambulatory Visit: Payer: Self-pay

## 2019-03-15 ENCOUNTER — Ambulatory Visit (INDEPENDENT_AMBULATORY_CARE_PROVIDER_SITE_OTHER): Payer: BLUE CROSS/BLUE SHIELD | Admitting: Internal Medicine

## 2019-03-15 DIAGNOSIS — I10 Essential (primary) hypertension: Secondary | ICD-10-CM | POA: Diagnosis not present

## 2019-03-15 DIAGNOSIS — J45991 Cough variant asthma: Secondary | ICD-10-CM | POA: Diagnosis not present

## 2019-03-15 DIAGNOSIS — Z7189 Other specified counseling: Secondary | ICD-10-CM

## 2019-03-15 NOTE — Progress Notes (Signed)
Virtual Visit via Video Note  I connected with@ on 03/15/19 at  9:00 AM EDT by a video enabled telemedicine application and verified that I am speaking with the correct person using two identifiers. Location patient: home Location provider:work  office Persons participating in the virtual visit: patient, provider  [place] national recommendations  regarding COVID 19 pandemic   video visit is advised over in office visit for this patient.  Patient aware  of the limitations of evaluation and management by telemedicine and  availability of in person appointments. and agreed to proceed.   HPI: [name] presents for video visit Fu asthma and work at home or excuse today . 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  Asthma only one time rescue inhaler  Using symbicrot bid   bp 112/80 range  Doing well    ROS: See pertinent positives and negatives per HPI.  Past Medical History:  Diagnosis Date  . 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   . Allergy   . Anemia   . Asthma   . GERD (gastroesophageal reflux disease)   . Heart murmur   . Hypertension   . Positive TB test   . Shingles   . TOBACCO USE 03/22/2009   Qualifier: Diagnosis of  By: Regis Bill MD, [name]  Stopped Dec 12 2010      Past Surgical History:  Procedure Laterality Date  . ABDOMINAL HYSTERECTOMY    . BREAST SURGERY     cyst from left breast  . BUNIONECTOMY     both feet  . CHOLECYSTECTOMY    . CYSTECTOMY     left shoulder  . KNEE SURGERY Left   . PANENDOSCOPY    . TUBAL LIGATION    . uterine ablastion     and polyps removed  . Uterine polyps      Family History  Problem Relation Age of Onset  . Hypertension Mother   . Alzheimer's disease Mother   . Hypertension Father   . Prostate cancer Father   . HIV Brother   .  Lupus Sister   . Colon cancer Paternal Grandmother   . Esophageal cancer Neg Hx   . Liver cancer Neg Hx   . Pancreatic cancer Neg Hx   . Rectal cancer Neg Hx   . Stomach cancer Neg Hx     Social History   Tobacco Use  . Smoking status: Former Smoker    Packs/day: 0.50    Years: 17.00    Pack years: 8.50    Types: Cigarettes    Last attempt to quit: 12/12/2010    Years since quitting: 8.2  . Smokeless tobacco: Never Used  Substance Use Topics  . Alcohol use: No  . Drug use: No      Current Outpatient Medications:  .  albuterol (VENTOLIN HFA) 108 (90 Base) MCG/ACT inhaler, Inhale 2 puffs into the lungs every 4 (four) hours as needed for wheezing or shortness of breath., Disp: , Rfl:  .  APPLE CIDER VINEGAR PO, Take 2,600 mg by mouth daily., Disp: , Rfl:  .  budesonide-formoterol (SYMBICORT) 80-4.5 MCG/ACT inhaler, INHALE 2 PUFFS BY MOUTH INTO THE LUNGS TWICE DAILY, Disp: 10.2 g, Rfl: 11 .  EPINEPHrine 0.15 MG/0.15ML IJ injection, Inject  0.15 mLs (0.15 mg total) into the muscle as needed for anaphylaxis., Disp: 2 Device, Rfl: 1 .  famotidine (PEPCID) 20 MG tablet, One at bedtime, Disp: 60 tablet, Rfl: 11 .  fluticasone (FLONASE) 50 MCG/ACT nasal spray, Place 2 sprays into both nostrils daily., Disp: , Rfl:  .  hydrochlorothiazide (HYDRODIURIL) 25 MG tablet, TAKE ONE TABLET BY MOUTH DAILY, Disp: 30 tablet, Rfl: 4 .  ibuprofen (ADVIL,MOTRIN) 800 MG tablet, 1 po very 8-12 hours  For 10 days  Or as needed (Patient taking differently: Take 800 mg by mouth daily as needed for mild pain. 1 po very 8-12 hours  For 10 days  Or as needed), Disp: 40 tablet, Rfl: 1 .  meloxicam (MOBIC) 15 MG tablet, Take 1 tablet (15 mg total) by mouth daily. Take 1 daily with food., Disp: 10 tablet, Rfl: 0 .  methocarbamol (ROBAXIN) 500 MG tablet, Take 1-2 tablets (500-1,000 mg total) by mouth every 8 (eight) hours as needed for muscle spasms., Disp: 30 tablet, Rfl: 0 .  metoprolol tartrate (LOPRESSOR) 50 MG  tablet, Take 1 tablet (50 mg total) by mouth once for 1 dose. Take 1 tablet 1 hour prior to your Coronary CT, Disp: 1 tablet, Rfl: 0 .  Spacer/Aero-Holding Dorise Bullion, Use with HFA inhaler albuterol for asthma., Disp: 1 each, Rfl: 0 .  valsartan (DIOVAN) 160 MG tablet, TAKE 1 TABLET BY MOUTH ONCE DAILY, Disp: 30 tablet, Rfl: 2  EXAM: BP Readings from Last 3 Encounters:  11/11/18 104/70  09/16/18 126/90  08/28/18 118/88    VITALS per patient if applicable:  GENERAL: alert, oriented, appears well and in no acute distress looks well no cough and nl respiration   HEENT: atraumatic, conjunttiva clear, no obvious abnormalities on inspection of external nose and ears  NECK: normal movements of the head and neck  LUNGS: on inspection no signs of respiratory distress, breathing rate appears normal, no obvious gross SOB, gasping or wheezing  CV: no obvious cyanosis  MS: moves all visible extremities without noticeable abnormality  PSYCH/NEURO: pleasant and cooperative, no obvious depression or anxiety, speech and thought processing grossly intact   ASSESSMENT AND PLAN:  Discussed the following assessment and plan:  Cough variant asthma  Essential hypertension  Advice Given About Covid-19 Virus Infection Agree with  Continued  Work absence and reassess end of month May 31  And then reassess  Letter to be sent my chart  Counseled.   Expectant management and discussion of plan and treatment with opportunity to ask questions and all were answered. The patient agreed with the plan and demonstrated an understanding of the instructions.   Advised to call back or seek an in-person evaluation if worsening  or having  further concerns .     Shanon Ace, MD

## 2019-03-18 MED FILL — SYMBICORT 80-4.5 MCG INH: 80-4.5 | 30 days supply | Qty: 10 | Fill #3

## 2019-03-18 MED FILL — AEROCHAMBER: 30 days supply | Qty: 1 | Fill #0

## 2019-03-19 ENCOUNTER — Other Ambulatory Visit: Payer: Self-pay | Admitting: Internal Medicine

## 2019-03-19 MED FILL — VALSARTAN 320 MG TAB: 320 | 30 days supply | Qty: 15 | Fill #0

## 2019-03-31 ENCOUNTER — Encounter: Payer: Self-pay | Admitting: Internal Medicine

## 2019-03-31 ENCOUNTER — Other Ambulatory Visit: Payer: Self-pay

## 2019-03-31 ENCOUNTER — Ambulatory Visit (INDEPENDENT_AMBULATORY_CARE_PROVIDER_SITE_OTHER): Payer: BLUE CROSS/BLUE SHIELD | Admitting: Internal Medicine

## 2019-03-31 DIAGNOSIS — Z7189 Other specified counseling: Secondary | ICD-10-CM | POA: Diagnosis not present

## 2019-03-31 DIAGNOSIS — J45991 Cough variant asthma: Secondary | ICD-10-CM | POA: Diagnosis not present

## 2019-03-31 NOTE — Progress Notes (Signed)
Virtual Visit via Video Note  I connected with@ on 03/31/19 at  3:00 PM EDT by a video enabled telemedicine application and verified that I am speaking with the correct person using two identifiers. Location patient: home Location provider:work office Persons participating in the virtual visit: patient, provider.   [place] national recommendations  regarding COVID 19 pandemic   video visit is advised over in office visit for this patient.  Patient aware  of the limitations of evaluation and management by telemedicine and  availability of in person appointments. and agreed to proceed.   HPI: [name] presents for video visit  sda   Was visit  Last  May 11 with accommodations requested for her work place  seh is doing ok but very concerned that the work place has not made changes at this time  For safety distancing and masking etc   No flares at this time staying at home  She is quite concerned about risk of exposure   ROS: See pertinent positives and negatives per HPI.  Past Medical History:  Diagnosis Date  . 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   . Allergy   . Anemia   . Asthma   . GERD (gastroesophageal reflux disease)   . Heart murmur   . Hypertension   . Positive TB test   . Shingles   . TOBACCO USE 03/22/2009   Qualifier: Diagnosis of  By: Regis Bill MD, [name]  Stopped Dec 12 2010      Past Surgical History:  Procedure Laterality Date  . ABDOMINAL HYSTERECTOMY    . BREAST SURGERY     cyst from left breast  . BUNIONECTOMY     both feet  . CHOLECYSTECTOMY    . CYSTECTOMY     left shoulder  . KNEE SURGERY Left   . PANENDOSCOPY    . TUBAL LIGATION    . uterine ablastion     and polyps removed  . Uterine polyps      Family History  Problem Relation Age of Onset  . Hypertension Mother   . Alzheimer's disease Mother   . Hypertension Father   . Prostate cancer Father   . HIV Brother   . Lupus Sister   . Colon cancer Paternal Grandmother    . Esophageal cancer Neg Hx   . Liver cancer Neg Hx   . Pancreatic cancer Neg Hx   . Rectal cancer Neg Hx   . Stomach cancer Neg Hx     Social History   Tobacco Use  . Smoking status: Former Smoker    Packs/day: 0.50    Years: 17.00    Pack years: 8.50    Types: Cigarettes    Last attempt to quit: 12/12/2010    Years since quitting: 8.3  . Smokeless tobacco: Never Used  Substance Use Topics  . Alcohol use: No  . Drug use: No      Current Outpatient Medications:  .  albuterol (VENTOLIN HFA) 108 (90 Base) MCG/ACT inhaler, INHALE TWO PUFFS BY MOUTH EVERY 4 HOURS AS NEEDED ONLY IF YOU CANT CATCH YOUR BREATH, Disp: 18 g, Rfl: 0 .  APPLE CIDER VINEGAR PO, Take 2,600 mg by mouth daily., Disp: , Rfl:  .  budesonide-formoterol (SYMBICORT) 80-4.5 MCG/ACT inhaler, INHALE 2 PUFFS BY MOUTH INTO THE LUNGS TWICE DAILY, Disp: 10.2 g, Rfl: 11 .  EPINEPHrine 0.15 MG/0.15ML IJ injection, Inject 0.15 mLs (0.15 mg total) into the muscle as needed for  anaphylaxis., Disp: 2 Device, Rfl: 1 .  famotidine (PEPCID) 20 MG tablet, One at bedtime, Disp: 60 tablet, Rfl: 11 .  fluticasone (FLONASE) 50 MCG/ACT nasal spray, Place 2 sprays into both nostrils daily., Disp: , Rfl:  .  hydrochlorothiazide (HYDRODIURIL) 25 MG tablet, TAKE ONE TABLET BY MOUTH DAILY, Disp: 30 tablet, Rfl: 4 .  ibuprofen (ADVIL,MOTRIN) 800 MG tablet, 1 po very 8-12 hours  For 10 days  Or as needed (Patient taking differently: Take 800 mg by mouth daily as needed for mild pain. 1 po very 8-12 hours  For 10 days  Or as needed), Disp: 40 tablet, Rfl: 1 .  meloxicam (MOBIC) 15 MG tablet, Take 1 tablet (15 mg total) by mouth daily. Take 1 daily with food., Disp: 10 tablet, Rfl: 0 .  methocarbamol (ROBAXIN) 500 MG tablet, Take 1-2 tablets (500-1,000 mg total) by mouth every 8 (eight) hours as needed for muscle spasms., Disp: 30 tablet, Rfl: 0 .  metoprolol tartrate (LOPRESSOR) 50 MG tablet, Take 1 tablet (50 mg total) by mouth once for 1 dose.  Take 1 tablet 1 hour prior to your Coronary CT, Disp: 1 tablet, Rfl: 0 .  Spacer/Aero-Holding Dorise Bullion, Use with HFA inhaler albuterol for asthma., Disp: 1 each, Rfl: 0 .  valsartan (DIOVAN) 160 MG tablet, TAKE 1 TABLET BY MOUTH ONCE DAILY, Disp: 30 tablet, Rfl: 2  EXAM: BP Readings from Last 3 Encounters:  11/11/18 104/70  09/16/18 126/90  08/28/18 118/88    VITALS per patient if applicable:  GENERAL: alert, oriented, appears well and in no acute distress  HEENT: atraumatic, conjunttiva clear, no obvious abnormalities on inspection of external nose and ears  PSYCH/NEURO: pleasant and cooperative, no obvious depression or anxiety, speech and thought processing grossly intact   ASSESSMENT AND PLAN:  Discussed the following assessment and plan:  Cough variant asthma  Advice Given About Covid-19 Virus Infection Disc  Return to work when accommodations are adequate but she should reach out to supervisors  And  Letter written for  Extension to Jun 24   To implement this and reassess.  Advised the  Virus will be  In the community to some degree and may  in crease in the fall so  Ongoing safety behaviors and physical processes can extend in to the future  Counseled.  At length about what to request in work area to be  More protected .  Letter written til June 24   Expectant management and discussion of plan and treatment with opportunity to ask questions and all were answered. The patient agreed with the plan and demonstrated an understanding of the instructions.   Advised to call back or seek an in-person evaluation if worsening  or having  further concerns . In the interim   Shanon Ace, MD

## 2019-04-20 MED FILL — SYMBICORT 80-4.5 MCG INH: 80-4.5 | 30 days supply | Qty: 10 | Fill #4

## 2019-04-21 ENCOUNTER — Telehealth: Payer: Self-pay

## 2019-04-21 NOTE — Telephone Encounter (Signed)
Copied from [place] [id number]. Topic: General - Other >> Apr 21, 2019 10:27 AM Leward Quan A wrote: Reason for CRM: Pharmacy called to say that valsartan (DIOVAN) 160 MG tablet is unavailable asking for a replacement Rx to be sent to the pharmacy please.

## 2019-04-22 MED FILL — VALSARTAN 320 MG TAB: 320 | 30 days supply | Qty: 15 | Fill #1

## 2019-04-22 NOTE — Telephone Encounter (Signed)
I need to know what else IS available    She has had SE of some meds    Please advise all in the arb group that is aviable  So I  Can select from an active list .

## 2019-04-27 ENCOUNTER — Ambulatory Visit (HOSPITAL_COMMUNITY)
Admission: EM | Admit: 2019-04-27 | Discharge: 2019-04-27 | Disposition: A | Discharge status: Home / Self Care | Payer: BC Managed Care – PPO | Attending: Family Medicine | Admitting: Family Medicine

## 2019-04-27 ENCOUNTER — Encounter (HOSPITAL_COMMUNITY): Payer: Self-pay

## 2019-04-27 ENCOUNTER — Other Ambulatory Visit: Payer: Self-pay

## 2019-04-27 DIAGNOSIS — R0789 Other chest pain: Secondary | ICD-10-CM | POA: Diagnosis not present

## 2019-04-27 DIAGNOSIS — J4541 Moderate persistent asthma with (acute) exacerbation: Secondary | ICD-10-CM | POA: Diagnosis not present

## 2019-04-27 DIAGNOSIS — I10 Essential (primary) hypertension: Secondary | ICD-10-CM | POA: Diagnosis not present

## 2019-04-27 DIAGNOSIS — J4520 Mild intermittent asthma, uncomplicated: Secondary | ICD-10-CM | POA: Diagnosis not present

## 2019-04-27 MED ORDER — ALBUTEROL SULFATE (2.5 MG/3ML) 0.083% IN NEBU: 2.5000 mg | INHALATION_SOLUTION | Freq: Four times a day (QID) | RESPIRATORY_TRACT | 12 refills | Status: DC | PRN

## 2019-04-27 MED ORDER — PREDNISONE 50 MG PO TABS: 50.0000 mg | ORAL_TABLET | Freq: Every day | ORAL | 0 refills | Status: AC

## 2019-04-27 NOTE — Discharge Instructions (Signed)
EKG normal Begin prednisone daily for 5 days with food in the morning May use nebulizer solutions as alternative to inhalers for shortness of breath, chest tightness  Please follow-up here or in the emergency room if symptoms persisting or not improving with the above Please follow-up with developing worsening chest pain in the emergency room

## 2019-04-27 NOTE — ED Provider Notes (Signed)
[facility]    CSN: [id number] Arrival date & time: 04/27/19  0930     History   Chief Complaint Chief Complaint  Patient presents with  . Asthma    HPI [name] is a 53 y.o. female history of allergic rhinitis, asthma, GERD, hypertension, presenting today for evaluation of asthma exacerbation.  Patient states that this morning she was going for a walk and started to cough and feel short of breath.  She used her inhaler and continued to walk, but not as briskly.  She needed to use her inhaler a couple more times before returning home.  She states that she typically feels as if there is sand in her chest when she has worsening of her asthma, but today she feels as if there is a rock on her chest.  The chest discomfort feels like a pressure/tightness/pain.  Denies history of diabetes.  Previous tobacco user, quit smoking in 2012.  Is on Symbicort twice daily.  Does not have nebulizers at home.  Denies fevers chills or body aches.  Denies persistent coughing since onset of symptoms.  Denies significant wheezing.  Denies known exposure to COVID.  HPI  Past Medical History:  Diagnosis Date  . 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   . Allergy   . Anemia   . Asthma   . GERD (gastroesophageal reflux disease)   . Heart murmur   . Hypertension   . Positive TB test   . Shingles   . TOBACCO USE 03/22/2009   Qualifier: Diagnosis of  By: Regis Bill MD, [name]  Stopped Dec 12 2010      Patient Active Problem List   Diagnosis Date Noted  . Headache 07/02/2013  . Cough variant asthma 09/29/2009  . Essential hypertension 05/30/2009  . GERD 05/12/2008  . HOT FLASHES 05/12/2008  . 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 ALLERGIC RHINITIS 08/07/2007    Past Surgical History:  Procedure Laterality Date  . ABDOMINAL HYSTERECTOMY    . BREAST SURGERY     cyst from left breast  . BUNIONECTOMY     both feet  . CHOLECYSTECTOMY    . CYSTECTOMY     left shoulder  . KNEE  SURGERY Left   . PANENDOSCOPY    . TUBAL LIGATION    . uterine ablastion     and polyps removed  . Uterine polyps      OB History   No obstetric history on file.      Home Medications    Prior to Admission medications   Medication Sig Start Date End Date Taking? Authorizing Provider  albuterol (PROVENTIL) (2.5 MG/3ML) 0.083% nebulizer solution Take 3 mLs (2.5 mg total) by nebulization every 6 (six) hours as needed for wheezing or shortness of breath. 04/27/19   Jevon Littlepage C, PA-C  albuterol (VENTOLIN HFA) 108 (90 Base) MCG/ACT inhaler INHALE TWO PUFFS BY MOUTH EVERY 4 HOURS AS NEEDED ONLY IF YOU CANT CATCH YOUR BREATH 03/19/19   [name], MD  APPLE CIDER VINEGAR PO Take 2,600 mg by mouth daily.    [provider]  budesonide-formoterol (SYMBICORT) 80-4.5 MCG/ACT inhaler INHALE 2 PUFFS BY MOUTH INTO THE LUNGS TWICE DAILY 11/20/18   [name], MD  EPINEPHrine 0.15 MG/0.15ML IJ injection Inject 0.15 mLs (0.15 mg total) into the muscle as needed for anaphylaxis. 06/19/18   [name], [name], MD  famotidine (PEPCID) 20 MG tablet One at bedtime 11/11/18   Wert,  Christena Deem, MD  fluticasone (FLONASE) 50 MCG/ACT nasal spray Place 2 sprays into both nostrils daily.    [provider]  hydrochlorothiazide (HYDRODIURIL) 25 MG tablet TAKE ONE TABLET BY MOUTH DAILY 02/24/19   [name], [name], MD  ibuprofen (ADVIL,MOTRIN) 800 MG tablet 1 po very 8-12 hours  For 10 days  Or as needed Patient taking differently: Take 800 mg by mouth daily as needed for mild pain. 1 po very 8-12 hours  For 10 days  Or as needed 04/24/18   [name], [name], MD  meloxicam (MOBIC) 15 MG tablet Take 1 tablet (15 mg total) by mouth daily. Take 1 daily with food. 08/19/18   Harris, Vernie Shanks, PA-C  methocarbamol (ROBAXIN) 500 MG tablet Take 1-2 tablets (500-1,000 mg total) by mouth every 8 (eight) hours as needed for muscle spasms. 04/24/18   [name], [name], MD  metoprolol tartrate (LOPRESSOR) 50 MG  tablet Take 1 tablet (50 mg total) by mouth once for 1 dose. Take 1 tablet 1 hour prior to your Coronary CT 08/03/18 11/11/18  Jerline Pain, MD  predniSONE (DELTASONE) 50 MG tablet Take 1 tablet (50 mg total) by mouth daily for 5 days. 04/27/19 05/02/19  Shaneya Taketa, Elesa Hacker, PA-C  Spacer/Aero-Holding Chambers DEVI Use with [facility] inhaler albuterol for asthma. 02/19/19   [name], [name], MD  valsartan (DIOVAN) 160 MG tablet TAKE 1 TABLET BY MOUTH ONCE DAILY 02/08/19   [name], [name], MD    Family History Family History  Problem Relation Age of Onset  . Hypertension Mother   . Alzheimer's disease Mother   . Hypertension Father   . Prostate cancer Father   . HIV Brother   . Lupus Sister   . Colon cancer Paternal Grandmother   . Esophageal cancer Neg Hx   . Liver cancer Neg Hx   . Pancreatic cancer Neg Hx   . Rectal cancer Neg Hx   . Stomach cancer Neg Hx     Social History Social History   Tobacco Use  . Smoking status: Former Smoker    Packs/day: 0.50    Years: 17.00    Pack years: 8.50    Types: Cigarettes    Quit date: 12/12/2010    Years since quitting: 8.3  . Smokeless tobacco: Never Used  Substance Use Topics  . Alcohol use: No  . Drug use: No     Allergies   Diovan hct [valsartan-hydrochlorothiazide]; Purified water [water, sterile]; Allegra [fexofenadine]; Chocolate; Cimetidine; Citric acid; Coly-mycin s; Peanut-containing drug products; and Amlodipine   Review of Systems Review of Systems  Constitutional: Negative for activity change, appetite change, chills, fatigue and fever.  HENT: Negative for congestion, ear pain, rhinorrhea, sinus pressure, sore throat and trouble swallowing.   Eyes: Negative for discharge and redness.  Respiratory: Positive for cough, chest tightness and shortness of breath.   Cardiovascular: Positive for chest pain.  Gastrointestinal: Negative for abdominal pain, diarrhea, nausea and vomiting.  Musculoskeletal: Negative for myalgias.  Skin:  Negative for rash.  Neurological: Negative for dizziness, light-headedness and headaches.     Physical Exam Triage Vital Signs ED Triage Vitals  Enc Vitals Group     BP 04/27/19 0953 (!) 144/90     Pulse Rate 04/27/19 0953 97     Resp 04/27/19 0953 16     Temp 04/27/19 0953 97.6 F (36.4 C)     Temp Source 04/27/19 0953 Oral     SpO2 04/27/19 0953 99 %  Weight 04/27/19 0951 132 lb (59.9 kg)     Height --      Head Circumference --      Peak Flow --      Pain Score 04/27/19 0951 5     Pain Loc --      Pain Edu? --      Excl. in [place]? --    No data found.  Updated Vital Signs BP (!) 144/90 (BP Location: Right Arm)   Pulse 97   Temp 97.6 F (36.4 C) (Oral)   Resp 16   Wt 132 lb (59.9 kg)   SpO2 99%   BMI 25.78 kg/m   Visual Acuity Right Eye Distance:   Left Eye Distance:   Bilateral Distance:    Right Eye Near:   Left Eye Near:    Bilateral Near:     Physical Exam Vitals signs and nursing note reviewed.  Constitutional:      General: She is not in acute distress.    Appearance: She is well-developed.     Comments: Lying on exam table, no acute distress  HENT:     Head: Normocephalic and atraumatic.     Ears:     Comments: Bilateral ears without tenderness to palpation of external auricle, tragus and mastoid, EAC's without erythema or swelling, TM's with good bony landmarks and cone of light. Non erythematous.     Mouth/Throat:     Comments: Oral mucosa pink and moist, no tonsillar enlargement or exudate. Posterior pharynx patent and nonerythematous, no uvula deviation or swelling. Normal phonation. Eyes:     Conjunctiva/sclera: Conjunctivae normal.  Neck:     Musculoskeletal: Neck supple.  Cardiovascular:     Rate and Rhythm: Normal rate and regular rhythm.     Heart sounds: No murmur.  Pulmonary:     Effort: Pulmonary effort is normal. No respiratory distress.     Breath sounds: Normal breath sounds.     Comments: Breathing comfortably at rest,  CTABL, no wheezing, rales or other adventitious sounds auscultated Abdominal:     Palpations: Abdomen is soft.     Tenderness: There is no abdominal tenderness.  Skin:    General: Skin is warm and dry.  Neurological:     Mental Status: She is alert.      UC Treatments / Results  Labs (all labs ordered are listed, but only abnormal results are displayed) Labs Reviewed - No data to display  EKG None  Radiology No results found.  Procedures Procedures (including critical care time)  Medications Ordered in UC Medications - No data to display  Initial Impression / Assessment and Plan / UC Course  I have reviewed the triage vital signs and the nursing notes.  Pertinent labs & imaging results that were available during my care of the patient were reviewed by me and considered in my medical decision making (see chart for details).     EKG normal sinus rhythm, no acute signs of ischemia or infarction.  Symptoms most likely related to asthma.  Will send home with nebulizer machine and albuterol nebulizers.  5-day course of prednisone to help decrease inflammation contributing to symptoms.  Advised to follow-up in the emergency room if chest pain worsening or developing increased shortness of breath, not improving with nebulizers and steroids.  Patient stable for discharge.  Does not appear to be in any respiratory distress at this time.  Discussed strict return precautions. Patient verbalized understanding and is agreeable with plan.  Final Clinical  Impressions(s) / UC Diagnoses   Final diagnoses:  Moderate persistent asthma with exacerbation     Discharge Instructions     EKG normal Begin prednisone daily for 5 days with food in the morning May use nebulizer solutions as alternative to inhalers for shortness of breath, chest tightness  Please follow-up here or in the emergency room if symptoms persisting or not improving with the above Please follow-up with developing  worsening chest pain in the emergency room    ED Prescriptions    Medication Sig Dispense Auth. Provider   predniSONE (DELTASONE) 50 MG tablet Take 1 tablet (50 mg total) by mouth daily for 5 days. 5 tablet Chevez Sambrano C, PA-C   albuterol (PROVENTIL) (2.5 MG/3ML) 0.083% nebulizer solution Take 3 mLs (2.5 mg total) by nebulization every 6 (six) hours as needed for wheezing or shortness of breath. 125 mL Verlaine Embry C, PA-C     Controlled Substance Prescriptions [place] Controlled Substance Registry consulted? Not Applicable   Janith Lima, [place] 04/27/19 1036

## 2019-04-27 NOTE — ED Triage Notes (Signed)
Pt states she has a asthma attack. Pt states she had a coughing issue and she has has a asthma attack. This happen at about 6 :20 am.

## 2019-04-28 NOTE — Progress Notes (Signed)
Virtual Visit via Video Note  I connected with@ on 04/29/19 at  9:45 AM EDT by a video enabled telemedicine application and verified that I am speaking with the correct person using two identifiers. Location patient: home Location provider:work  office Persons participating in the virtual visit: patient, provider  [place] national recommendations  regarding COVID 19 pandemic   video visit is advised over in office visit for this patient.  Patient aware  of the limitations of evaluation and management by telemedicine and  availability of in person appointments. and agreed to proceed.   HPI: [name] presents for video visit  For fu ed visit  For chest pressure and  Felt to be  Poss asthma flare   Had  eval and ekg  And given pred  On day 3 of such and not that much better   descibes  Upper mid chest pressure and left shoulder at times   Cough no fever   Daughter had recnet uri cough can be   On going  .  Last ventolin was 2 days ago but taking her symbicort.   Still concern about work and needs update note for supervisor .   Was unaware of new masking mandate   ROS: See pertinent positives and negatives per HPI. No fever chills  Had been walking [place] per day   Past Medical History:  Diagnosis Date  . 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   . Allergy   . Anemia   . Asthma   . GERD (gastroesophageal reflux disease)   . Heart murmur   . Hypertension   . Positive TB test   . Shingles   . TOBACCO USE 03/22/2009   Qualifier: Diagnosis of  By: Regis Bill MD, [name]  Stopped Dec 12 2010      Past Surgical History:  Procedure Laterality Date  . ABDOMINAL HYSTERECTOMY    . BREAST SURGERY     cyst from left breast  . BUNIONECTOMY     both feet  . CHOLECYSTECTOMY    . CYSTECTOMY     left shoulder  . KNEE SURGERY Left   . PANENDOSCOPY    . TUBAL LIGATION    . uterine ablastion     and polyps removed  . Uterine polyps      Family History  Problem Relation  Age of Onset  . Hypertension Mother   . Alzheimer's disease Mother   . Hypertension Father   . Prostate cancer Father   . HIV Brother   . Lupus Sister   . Colon cancer Paternal Grandmother   . Esophageal cancer Neg Hx   . Liver cancer Neg Hx   . Pancreatic cancer Neg Hx   . Rectal cancer Neg Hx   . Stomach cancer Neg Hx     Social History   Tobacco Use  . Smoking status: Former Smoker    Packs/day: 0.50    Years: 17.00    Pack years: 8.50    Types: Cigarettes    Quit date: 12/12/2010    Years since quitting: 8.3  . Smokeless tobacco: Never Used  Substance Use Topics  . Alcohol use: No  . Drug use: No      Current Outpatient Medications:  .  albuterol (PROVENTIL) (2.5 MG/3ML) 0.083% nebulizer solution, Take 3 mLs (2.5 mg total) by nebulization every 6 (six) hours as needed for wheezing or shortness of breath., Disp: 125 mL, Rfl: 12 .  albuterol (VENTOLIN  HFA) 108 (90 Base) MCG/ACT inhaler, INHALE TWO PUFFS BY MOUTH EVERY 4 HOURS AS NEEDED ONLY IF YOU CANT CATCH YOUR BREATH, Disp: 18 g, Rfl: 0 .  APPLE CIDER VINEGAR PO, Take 2,600 mg by mouth daily., Disp: , Rfl:  .  budesonide-formoterol (SYMBICORT) 80-4.5 MCG/ACT inhaler, INHALE 2 PUFFS BY MOUTH INTO THE LUNGS TWICE DAILY, Disp: 10.2 g, Rfl: 11 .  EPINEPHrine 0.15 MG/0.15ML IJ injection, Inject 0.15 mLs (0.15 mg total) into the muscle as needed for anaphylaxis., Disp: 2 Device, Rfl: 1 .  famotidine (PEPCID) 20 MG tablet, One at bedtime, Disp: 60 tablet, Rfl: 11 .  fluticasone (FLONASE) 50 MCG/ACT nasal spray, Place 2 sprays into both nostrils daily., Disp: , Rfl:  .  hydrochlorothiazide (HYDRODIURIL) 25 MG tablet, TAKE ONE TABLET BY MOUTH DAILY, Disp: 30 tablet, Rfl: 4 .  ibuprofen (ADVIL,MOTRIN) 800 MG tablet, 1 po very 8-12 hours  For 10 days  Or as needed (Patient taking differently: Take 800 mg by mouth daily as needed for mild pain. 1 po very 8-12 hours  For 10 days  Or as needed), Disp: 40 tablet, Rfl: 1 .  meloxicam  (MOBIC) 15 MG tablet, Take 1 tablet (15 mg total) by mouth daily. Take 1 daily with food., Disp: 10 tablet, Rfl: 0 .  methocarbamol (ROBAXIN) 500 MG tablet, Take 1-2 tablets (500-1,000 mg total) by mouth every 8 (eight) hours as needed for muscle spasms., Disp: 30 tablet, Rfl: 0 .  metoprolol tartrate (LOPRESSOR) 50 MG tablet, Take 1 tablet (50 mg total) by mouth once for 1 dose. Take 1 tablet 1 hour prior to your Coronary CT, Disp: 1 tablet, Rfl: 0 .  predniSONE (DELTASONE) 50 MG tablet, Take 1 tablet (50 mg total) by mouth daily for 5 days., Disp: 5 tablet, Rfl: 0 .  Spacer/Aero-Holding Chambers DEVI, Use with HFA inhaler albuterol for asthma., Disp: 1 each, Rfl: 0 .  valsartan (DIOVAN) 160 MG tablet, TAKE 1 TABLET BY MOUTH ONCE DAILY, Disp: 30 tablet, Rfl: 2  EXAM: BP Readings from Last 3 Encounters:  04/27/19 (!) 144/90  11/11/18 104/70  09/16/18 126/90    VITALS per patient if applicable:  GENERAL: alert, oriented, appears well and in no acute distress looks unsettled but nl speech and no sob  .  HEENT: atraumatic, conjunttiva clear, no obvious abnormalities on inspection of external nose and ears NECK: normal movements of the head and neck LUNGS: on inspection no signs of respiratory distress, breathing rate appears normal, no obvious gross SOB, gasping or wheezing CV: no obvious cyanosis MS: moves all visible extremities without noticeable abnormality PSYCH/NEURO: pleasant and cooperative, looks  Like not feeling well  speech and thought processing grossly intact  ED notes reviewed  ASSESSMENT AND PLAN:  Discussed the following assessment and plan:    ICD-10-CM   1. Chest discomfort  R07.89   2. Cough variant asthma  J45.991   3. Medication management  Z79.899    Increase pepcid to bid   Albuterol as needed   Finish out the pred  Ed visit is reassuring   By exam ekg etc   Poss esophageal and or getting the viral uri that her daughter has since recovered from .  Plan fu  contact next week if not  Resolving   Note for work  Renewed avoidance but dsic that may do better  If all mask wearing  Counseled.   Expectant management and discussion of plan and treatment with opportunity to ask questions and  all were answered. The patient agreed with the plan and demonstrated an understanding of the instructions.   Advised to call back or seek an in-person evaluation if worsening  or having  further concerns .  Shanon Ace, MD

## 2019-04-29 ENCOUNTER — Encounter: Payer: Self-pay | Admitting: Internal Medicine

## 2019-04-29 ENCOUNTER — Ambulatory Visit (INDEPENDENT_AMBULATORY_CARE_PROVIDER_SITE_OTHER): Payer: BC Managed Care – PPO | Admitting: Internal Medicine

## 2019-04-29 ENCOUNTER — Other Ambulatory Visit: Payer: Self-pay

## 2019-04-29 DIAGNOSIS — Z79899 Other long term (current) drug therapy: Secondary | ICD-10-CM | POA: Diagnosis not present

## 2019-04-29 DIAGNOSIS — R0789 Other chest pain: Secondary | ICD-10-CM

## 2019-04-29 DIAGNOSIS — J45991 Cough variant asthma: Secondary | ICD-10-CM | POA: Diagnosis not present

## 2019-04-29 NOTE — Telephone Encounter (Signed)
Pt had appt with [place] today

## 2019-05-12 ENCOUNTER — Encounter: Payer: Self-pay | Admitting: Internal Medicine

## 2019-05-12 ENCOUNTER — Ambulatory Visit (INDEPENDENT_AMBULATORY_CARE_PROVIDER_SITE_OTHER): Payer: BC Managed Care – PPO | Admitting: Internal Medicine

## 2019-05-12 ENCOUNTER — Other Ambulatory Visit: Payer: Self-pay

## 2019-05-12 DIAGNOSIS — J45991 Cough variant asthma: Secondary | ICD-10-CM | POA: Diagnosis not present

## 2019-05-12 MED ORDER — PANTOPRAZOLE SODIUM 40 MG PO TBEC: 40.0000 mg | DELAYED_RELEASE_TABLET | Freq: Every day | ORAL | 2 refills | Status: DC

## 2019-05-12 NOTE — Assessment & Plan Note (Addendum)
Onset 2010  - PFT's wnl 02/20/11  While on symbicort with improved symptom control > f/u pulm prn - FENO 02/12/2018  =   62 on symb 80 2bid with poor hfa - Spirometry 02/12/2018  Nl   - 02/12/2018    continue symb 80 2bid and add max gerd rx  - Allergy profile 02/12/18  >  Eos 0.4 /  IgE  50  RAST Pos grass > dog  - 03/19/2018 try to taper off gerd rx > flared 05/2018 so resumed - FENO 05/26/2018  =   26 on symb 80 2bid   - 05/12/2019  After extensive coaching inhaler device,  effectiveness =    90%   S/p exac > All goals of chronic asthma control met including optimal function and elimination of symptoms with minimal need for rescue therapy.  Contingencies discussed in full including contacting this office immediately if not controlling the symptoms using the rule of two's.      If flares again should immediately start back on ppi ac and call for higher strength symbicort rx   I had an extended discussion with the patient reviewing all relevant studies completed to date and  lasting 15 to 20 minutes of a 25 minute visit    See device teaching which extended face to face time for this visit.  Each maintenance medication was reviewed in detail including emphasizing most importantly the difference between maintenance and prns and under what circumstances the prns are to be triggered using an action plan format that is not reflected in the computer generated alphabetically organized AVS which I have not found useful in most complex patients, especially with respiratory illnesses  Please see AVS for specific instructions unique to this visit that I personally wrote and verbalized to the the pt in detail and then reviewed with pt  by my nurse highlighting any  changes in therapy recommended at today's visit to their plan of care.

## 2019-05-12 NOTE — Patient Instructions (Addendum)
For flares of breathing / coughing > protonix 40 mg Take 30-60 min before first meal of the day until 100% better x 2 weeks then ok to take as needed  Continue pepcid 40 mg after supper daily or an hour before bed which is easier  Plan A = Automatic = Symbicort 80 Take 2 puffs first thing in am and then another 2 puffs about 12 hours later.    Plan B = Backup Only use your albuterol inhaler as a rescue medication to be used if you can't catch your breath by resting or doing a relaxed purse lip breathing pattern.  - The less you use it, the better it will work when you need it. - Ok to use the inhaler up to 2 puffs  every 4 hours if you must but call for appointment if use goes up over your usual need - Don't leave home without it !!  (think of it like the spare tire for your car)     Plan C = Crisis - only use your albuterol nebulizer if you first try Plan B and it fails to help > ok to use the nebulizer up to every 4 hours but if start needing it regularly call for immediate appointment   Please schedule a follow up visit in 6  months but call sooner if needed

## 2019-05-12 NOTE — Progress Notes (Signed)
Subjective:    Patient ID: [name], female    DOB: [birth year], 53 y.o.   MRN: [id number]    Brief patient profile:  50  yobf  Quit smoking 2012 with onset of variable sob 2010 but nl pfts while on symbicort 02/20/11    History of Present Illness  January 08, 2011 ov cc worse dyspnea, wheezing no cough x 2 weeks no better p inalers, feels something stuck in ssn but swallowing ok. general chest tightness not better with saba.  Patient Instructions:  1) Work on inhaler technique: relax and blow all the way out then take a nice smooth deep breath back in, triggering the inhaler at same time you start breathing in  2) Symbicort 2 puffs first thing in am and 2 puffs again in pm about 12 hours later  3) Change nexium Take one 30-60 min before first meal of the day and add pepcid 20 mg one at bedtime  4) GERD (REFLUX)          02/12/2018  f/u ov/Darrek Leasure re: ? Flare of asthma  Not even 50% better p er rx x 2 in Feb 2019  Chief Complaint  Patient presents with  . Pulmonary Consult    Self referral.  She c/o SOB x 3 months "all the time"- with or without any exertion. She states when she lies down she hears "crackling noise".  She also c/o non prod cough- usually worse at night and can be triggered by cleaning products. She has been using proair 3-4 x per day, but none in the past wk.   Dyspnea:  Variably sob with exertion / prior to Nov 04 2017 was fine with good aerobic capacity consistently fast walk up hills off maint rx x years on otc nexium only bedtime and variably bad breath/ hb on this rx  Cough: 30 m to an hour p hs/ not as common later or early am /worse with exp to clearing products Sleep: most nights ok SABA use:  Way over using now where wasn't using anything prior to Nov 04 2017 and not helping rec Plan A = Automatic = Symbicort 80 Take 2 puffs first thing in am and then another 2 puffs about 12 hours later.  Work on inhaler technique:  Plan B = Backup Only use your  albuterol(Proventil/Proair/Ventolin)  as a rescue medication Protonix Take 30- 60 min before your first and last meals of the day  GERD diet   03/19/2018  f/u ov/Riker Collier re:  S/p asthma flare, back in control  Chief Complaint  Patient presents with  . Follow-up    Breathing is much improved and cough has resolved. She has only had to use her albuterol inhaler once since her last visit.    Dyspnea:  Ok including steps  Cough: no Sleep: ok  SABA use:  Only x one  rec Please schedule a follow up visit in 6  months but call sooner if needed  Continue the reflux diet Try off protonix and take over the counter  pepcid 20 mg after bfast and supper x 1 week then just after bfast x one week then off pepcid and if can't taper off then let your Primary care doctor know for purposes of refilling the protonix or taking nexium as needed    05/26/2018 acute extended ov/Laronica Bhagat re: cough   Chief Complaint  Patient presents with  . Acute Visit    asthma flare, cough, SOB, had to call the ambulance to her  job for an asthma attack , ran out of symbicort which may have caused asthma flare, just started protonix again  tapered off the protonix and maintained on pepcid but started cough/ rattling breathing w/in a week of the change and doe x steps despite maint on symb 80 2bid so refilled ppi bid ac as of 05/18/18 and yet no improvement initially/ some better on day of ov until starts dry  coughing fits Had improved to not needing any albuterol prior to above flare  Since onset of flare having trouble lying flat so props up on 5-6 pillows with freq noct awakening only some better p saba.  rec Plan A = Automatic = Symbicort 80 Take 2 puffs first thing in am and then another 2 puffs about 12 hours later.  Continue protonix 40 mg Take 30- 60 min before your first and last meals of the day  Plan B = Backup Only use your albuterol as a rescue medication Take delsym two tsp every 12 hours and supplement if needed with   tramadol 50 mg up to 2 every 4 hours Once you have eliminated the cough for 3 straight days try reducing the tramadol first,  then the delsym as tolerated.   For drainage / throat tickle try take CHLORPHENIRAMINE  4 mg -  Please schedule a follow up office visit in 4 weeks, call sooner if needed with all medications /inhalers/ solutions in hand  >  /22/19  NP eval/rx med calendar     11/11/2018  f/u ov/Gershom Brobeck re: cough flared off ppi  Chief Complaint  Patient presents with  . Follow-up    Breathing is doing well. She rarely uses her albuterol inhaler.   Dyspnea:  Not limited by breathing from desired activities   Cough: none Sleeping: fine flat SABA use: very rare 02: none rec Change to Pantoprazole (protonix) 40 mg   Take  30-60 min before first meal of the day and Pepcid (famotidine)  20 mg one after supper x 2 weeks then stop protonix and take pepcid 20 mg after bfast and supper x 2 weeks then just after supper x 2 weeks then stop and call if cough or breathing get worse with taper   05/12/2019  f/u ov/Jeannett Dekoning re: flare of cough vaiant asthma = cough chest heavy on Pepcid  40 mg one at bedtime  Chief Complaint  Patient presents with  . Follow-up    Had episode of increased SOB approx 2 wks ago- went to [facility] and given neb to take home. She used this every night for approx 1 wk to help with heaviness in her chest.   Dyspnea:  Back to rapid walking up 4 miles per day p prednsione/ h2 bid  Cough: resolved p prednisone  Sleeping: on slt hob elevation SABA use: none now  02: none   No obvious day to day or daytime variability or assoc excess/ purulent sputum or mucus plugs or hemoptysis or cp or chest tightness, subjective wheeze or overt sinus or hb symptoms.   Sleeping as above  without nocturnal  or early am exacerbation  of respiratory  c/o's or need for noct saba. Also denies any obvious fluctuation of symptoms with weather or environmental changes or other aggravating or alleviating  factors except as outlined above   No unusual exposure hx or h/o childhood pna/ asthma or knowledge of premature birth.  Current Allergies, Complete Past Medical History, Past Surgical History, Family History, and Social History were reviewed in [facility] electronic medical record.  ROS  The following are not active complaints unless bolded Hoarseness, sore throat, dysphagia, dental problems, itching, sneezing,  nasal congestion or discharge of excess mucus or purulent secretions, ear ache,   fever, chills, sweats, unintended wt loss or wt gain, classically pleuritic or exertional cp,  orthopnea pnd or arm/hand swelling  or leg swelling, presyncope, palpitations, abdominal pain, anorexia, nausea, vomiting, diarrhea  or change in bowel habits or change in bladder habits, change in stools or change in urine, dysuria, hematuria,  rash, arthralgias, visual complaints, headache, numbness, weakness or ataxia or problems with walking or coordination,  change in mood or  memory.        Current Meds  Medication Sig  . Al Hyd-Mg Tr-Alg Ac-Sod Bicarb (GAVISCON-2 PO) Take by mouth daily.  Marland Kitchen albuterol (PROVENTIL) (2.5 MG/3ML) 0.083% nebulizer solution Take 3 mLs (2.5 mg total) by nebulization every 6 (six) hours as needed for wheezing or shortness of breath.  Marland Kitchen albuterol (VENTOLIN HFA) 108 (90 Base) MCG/ACT inhaler INHALE TWO PUFFS BY MOUTH EVERY 4 HOURS AS NEEDED ONLY IF YOU CANT CATCH YOUR BREATH  . APPLE CIDER VINEGAR PO Take 2,600 mg by mouth daily.  . budesonide-formoterol (SYMBICORT) 80-4.5 MCG/ACT inhaler INHALE 2 PUFFS BY MOUTH INTO THE LUNGS TWICE DAILY  . Digestive Enzymes (ENZYME DIGEST PO) Take 1 tablet by mouth 3 (three) times daily.  Marland Kitchen EPINEPHrine 0.15 MG/0.15ML IJ injection Inject 0.15 mLs (0.15 mg total) into the muscle as needed for anaphylaxis.  . famotidine (PEPCID) 20 MG tablet One at bedtime  . fluticasone (FLONASE) 50 MCG/ACT nasal spray Place 2 sprays into both nostrils daily.  .  hydrochlorothiazide (HYDRODIURIL) 25 MG tablet TAKE ONE TABLET BY MOUTH DAILY  . ibuprofen (ADVIL,MOTRIN) 800 MG tablet 1 po very 8-12 hours  For 10 days  Or as needed (Patient taking differently: Take 800 mg by mouth daily as needed for mild pain. 1 po very 8-12 hours  For 10 days  Or as needed)  . Probiotic Product (PROBIOTIC-10 PO) Take 1 capsule by mouth daily.  Marland Kitchen Spacer/Aero-Holding Dorise Bullion Use with HFA inhaler albuterol for asthma.  . 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 Quit 12/12/10. Smoked for approx 10 yrs up to 1/4 ppd  Alcohol use-yes  employed [employer]               Objective:   Physical Exam    Wt Readings from Last 3 Encounters:  05/12/19 150 lb (68 kg)  04/27/19 132 lb (59.9 kg)  11/11/18 141 lb (64 kg)     Vital signs reviewed - Note on arrival 02 sats  98% on RA   HEENT: nl dentition, turbinates bilaterally, and oropharynx. Nl external ear canals without cough reflex   NECK :  without JVD/Nodes/TM/ nl carotid upstrokes bilaterally   LUNGS: no acc muscle use,  Nl contour chest which  is clear to A and P bilaterally without cough on insp or exp maneuvers   CV:  RRR  no s3 or murmur or increase in P2, and no edema   ABD:  soft and nontender with nl inspiratory excursion in the supine position. No bruits or organomegaly appreciated, bowel sounds nl  MS:  Nl gait/ ext warm without deformities, calf tenderness, cyanosis or clubbing No obvious joint restrictions   SKIN: warm and dry without lesions    NEURO:   alert, approp, nl sensorium with  no motor or cerebellar deficits apparent.                Assessment & Plan:

## 2019-05-13 ENCOUNTER — Telehealth: Payer: Self-pay | Admitting: Internal Medicine

## 2019-05-13 NOTE — Telephone Encounter (Signed)
Pt came in and dropped off FMLA ([place]) to be completed by provider.  Upon completion pt would like for them to be faxed to the attn: Miliana Vujnovic at [phone] and a she would like to be called @ [phone] to pick up a copy.  Form placed in providers folder for completion.

## 2019-05-14 NOTE — Telephone Encounter (Signed)
Form placed in red folder  

## 2019-05-17 ENCOUNTER — Other Ambulatory Visit: Payer: Self-pay | Admitting: Internal Medicine

## 2019-05-17 MED FILL — VALSARTAN 320 MG TAB: 320 | 30 days supply | Qty: 15 | Fill #0

## 2019-05-17 MED FILL — SYMBICORT 80-4.5 MCG INH: 80-4.5 | 30 days supply | Qty: 10 | Fill #5

## 2019-05-18 NOTE — Telephone Encounter (Signed)
I have been looking at this form which is fmla and not sure   What/how  to fill out. Need dates for absence  for  Avoidance of covid risk   Not sure if that fits .waht is being asked   I  can document her asthma  .   Please  Have her go over ?s   Can give her a copy to fill out dates etc and then I can fill out an d reviewed

## 2019-05-21 NOTE — Telephone Encounter (Signed)
Pt would like copy to fill out fmla papers to fill out dates .

## 2019-05-26 DIAGNOSIS — Z0279 Encounter for issue of other medical certificate: Secondary | ICD-10-CM

## 2019-05-26 NOTE — Telephone Encounter (Signed)
Forms are now placed in red folder

## 2019-05-26 NOTE — Telephone Encounter (Signed)
Patient dropped off her completed part of the forms to have Dr. Regis Bill to complete. I gave the forms to [place].

## 2019-05-28 NOTE — Telephone Encounter (Signed)
Form has been completed and signed  In your  To do basket

## 2019-05-31 NOTE — Telephone Encounter (Signed)
Forms have been faxed to the requested number. Nothing further needed.

## 2019-06-14 ENCOUNTER — Ambulatory Visit (INDEPENDENT_AMBULATORY_CARE_PROVIDER_SITE_OTHER): Payer: BC Managed Care – PPO | Admitting: Gastroenterology

## 2019-06-14 ENCOUNTER — Encounter: Payer: Self-pay | Admitting: Gastroenterology

## 2019-06-14 ENCOUNTER — Other Ambulatory Visit: Payer: Self-pay

## 2019-06-14 VITALS — BP 114/84 | HR 72 | Temp 98.1°F | Ht 60.0 in | Wt 147.5 lb

## 2019-06-14 DIAGNOSIS — R1310 Dysphagia, unspecified: Secondary | ICD-10-CM | POA: Diagnosis not present

## 2019-06-14 DIAGNOSIS — K219 Gastro-esophageal reflux disease without esophagitis: Secondary | ICD-10-CM | POA: Diagnosis not present

## 2019-06-14 MED ORDER — PANTOPRAZOLE SODIUM 40 MG PO TBEC: 40.0000 mg | DELAYED_RELEASE_TABLET | Freq: Two times a day (BID) | ORAL | 11 refills | Status: DC

## 2019-06-14 NOTE — Patient Instructions (Signed)
Increase your pantoprazole to 40 mg twice daily. A new prescription has been sent to your pharmacy.   Patient advised to avoid spicy, acidic, citrus, chocolate, mints, fruit and fruit juices.  Limit the intake of caffeine, alcohol and Soda.  Don't exercise too soon after eating.  Don't lie down within 3-4 hours of eating.  Elevate the head of your bed.  You have been scheduled for an endoscopy. Please follow written instructions given to you at your visit today. If you use inhalers (even only as needed), please bring them with you on the day of your procedure.  Thank you for choosing me and [facility].  Pricilla Riffle. Dagoberto Ligas., MD., Marval Regal

## 2019-06-14 NOTE — Progress Notes (Signed)
History of Present Illness: This is a 53 year old female recurrent history of GERD.  She is followed by Dr. Melvyn Novas for management of cough, asthma who feels symptoms may be exacerbated by GERD.  She relates infrequent problems with epigastric discomfort and substernal burning.  She notes a frequent mild cough and throat clearing.  She notes nocturnal regurgitation intermittently and she generally sleeps on 2 pillows which has been effective.  She has been taking pantoprazole 40 mg daily for a few months and notes a mild improvement in epigastric discomfort, substernal burning and possibly a mild improvement in cough and throat clearing symptoms however she is not certain.  She notes occasionally difficulty swallowing solid foods for about 1 year. Denies weight loss, abdominal pain, constipation, diarrhea, change in stool caliber, melena, hematochezia, nausea, vomiting, chest pain.    Allergies  Allergen Reactions  . Diovan Hct [Valsartan-Hydrochlorothiazide] Swelling    Had facial swelling and throat itching after 2 doses of the generic had done well on the brand medicine for a while.  See ED visit February 2014  . Purified Water [Water, Sterile] Anaphylaxis  . Allegra [Fexofenadine] Hives  . Chocolate     triggers Asthma  . Cimetidine Hives    REACTION: unspecified  . Citric Acid     REACTION: swelling, hives, itching  . Coly-Mycin S Other (See Comments)    REACTION: pt reports she passed out  . Peanut-Containing Drug Products Swelling  . Amlodipine Other (See Comments)    At 5 mg dose; swelling   Outpatient Medications Prior to Visit  Medication Sig Dispense Refill  . Al Hyd-Mg Tr-Alg Ac-Sod Bicarb (GAVISCON-2 PO) Take by mouth daily.    Marland Kitchen albuterol (PROVENTIL) (2.5 MG/3ML) 0.083% nebulizer solution Take 3 mLs (2.5 mg total) by nebulization every 6 (six) hours as needed for wheezing or shortness of breath. 125 mL 12  . albuterol (VENTOLIN HFA) 108 (90 Base) MCG/ACT inhaler INHALE TWO  PUFFS BY MOUTH EVERY 4 HOURS AS NEEDED ONLY IF YOU CANT CATCH YOUR BREATH 18 g 0  . APPLE CIDER VINEGAR PO Take 2,600 mg by mouth daily.    . budesonide-formoterol (SYMBICORT) 80-4.5 MCG/ACT inhaler INHALE 2 PUFFS BY MOUTH INTO THE LUNGS TWICE DAILY 10.2 g 11  . Digestive Enzymes (ENZYME DIGEST PO) Take 1 tablet by mouth 3 (three) times daily.    Marland Kitchen EPINEPHrine 0.15 MG/0.15ML IJ injection Inject 0.15 mLs (0.15 mg total) into the muscle as needed for anaphylaxis. 2 Device 1  . famotidine (PEPCID) 20 MG tablet One at bedtime 60 tablet 11  . fluticasone (FLONASE) 50 MCG/ACT nasal spray Place 2 sprays into both nostrils daily.    . hydrochlorothiazide (HYDRODIURIL) 25 MG tablet TAKE ONE TABLET BY MOUTH DAILY 30 tablet 4  . ibuprofen (ADVIL,MOTRIN) 800 MG tablet 1 po very 8-12 hours  For 10 days  Or as needed (Patient taking differently: Take 800 mg by mouth daily as needed for mild pain. 1 po very 8-12 hours  For 10 days  Or as needed) 40 tablet 1  . pantoprazole (PROTONIX) 40 MG tablet Take 1 tablet (40 mg total) by mouth daily. Take 30-60 min before first meal of the day 30 tablet 2  . Probiotic Product (PROBIOTIC-10 PO) Take 1 capsule by mouth daily.    Marland Kitchen Spacer/Aero-Holding Dorise Bullion Use with HFA inhaler albuterol for asthma. 1 each 0  . valsartan (DIOVAN) 320 MG tablet TAKE 1/2 TABLET BY MOUTH DAILY 30 tablet 2  . valsartan (  DIOVAN) 160 MG tablet TAKE 1 TABLET BY MOUTH ONCE DAILY 30 tablet 2   No facility-administered medications prior to visit.    Past Medical History:  Diagnosis Date  . 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   . Allergy   . Anemia   . Asthma   . GERD (gastroesophageal reflux disease)   . Heart murmur   . Hypertension   . Positive TB test   . Shingles   . TOBACCO USE 03/22/2009   Qualifier: Diagnosis of  By: Regis Bill MD, [name]  Stopped Dec 12 2010     Past Surgical History:  Procedure Laterality Date  . ABDOMINAL HYSTERECTOMY    . BREAST  SURGERY     cyst from left breast  . BUNIONECTOMY     both feet  . CHOLECYSTECTOMY    . CYSTECTOMY     left shoulder  . KNEE SURGERY Left   . PANENDOSCOPY    . TUBAL LIGATION    . uterine ablastion     and polyps removed  . Uterine polyps     Social History   Socioeconomic History  . Marital status: Divorced    Spouse name: Not on file  . Number of children: Not on file  . Years of education: Not on file  . Highest education level: Not on file  Occupational History  . Not on file  Social Needs  . Financial resource strain: Not on file  . Food insecurity    Worry: Not on file    Inability: Not on file  . Transportation needs    Medical: Not on file    Non-medical: Not on file  Tobacco Use  . Smoking status: Former Smoker    Packs/day: 0.50    Years: 17.00    Pack years: 8.50    Types: Cigarettes    Quit date: 12/12/2010    Years since quitting: 8.5  . Smokeless tobacco: Never Used  Substance and Sexual Activity  . Alcohol use: No  . Drug use: No  . Sexual activity: Not on file  Lifestyle  . Physical activity    Days per week: Not on file    Minutes per session: Not on file  . Stress: Not on file  Relationships  . Social Herbalist on phone: Not on file    Gets together: Not on file    Attends religious service: Not on file    Active member of club or organization: Not on file    Attends meetings of clubs or organizations: Not on file    Relationship status: Not on file  Other Topics Concern  . 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 Hypertension Mother   . Alzheimer's disease Mother   . Hypertension Father   . Prostate cancer Father   . HIV Brother   . Lupus Sister   . Colon cancer Paternal Grandmother   . Esophageal cancer Neg Hx   . Liver  cancer Neg Hx   . Pancreatic cancer Neg Hx   . Rectal cancer Neg Hx   . Stomach cancer Neg Hx         Physical Exam: General: Well developed, well nourished, no acute  distress Head: Normocephalic and atraumatic Eyes:  sclerae anicteric, EOMI Ears: Normal auditory acuity Mouth: No deformity or lesions Lungs: Clear throughout to auscultation Heart: Regular rate and rhythm; no murmurs, rubs or bruits Abdomen: Soft, non tender and non distended. No masses, hepatosplenomegaly or hernias noted. Normal Bowel sounds Rectal: Not done Musculoskeletal: Symmetrical with no gross deformities  Pulses:  Normal pulses noted Extremities: No clubbing, cyanosis, edema or deformities noted Neurological: Alert oriented x 4, grossly nonfocal Psychological:  Alert and cooperative. Normal mood and affect   Assessment and Recommendations:  1. GERD with suspected LPR and dysphagia.  Closely follow all antireflux measures.  Discussed using a wedge pillow, elevating the head of her bed 4 inches or continuing with use of 2 pillows.  Increase pantoprazole to 40 mg twice daily, 30 minutes before breakfast and dinner.  Consider adding famotidine at bedtime if symptoms not adequately controlled.  Schedule EGD with possible dilation. The risks (including bleeding, perforation, infection, missed lesions, medication reactions and possible hospitalization or surgery if complications occur), benefits, and alternatives to endoscopy with possible biopsy and possible dilation were discussed with the patient and they consent to proceed.   2.  Personal history of adenomatous colon polyps.  A 3-year interval surveillance colonoscopy is recommended in April 2022.  3.  History of colonic AVMs.

## 2019-06-16 ENCOUNTER — Encounter: Payer: Self-pay | Admitting: Gastroenterology

## 2019-06-16 ENCOUNTER — Other Ambulatory Visit: Payer: Self-pay | Admitting: Internal Medicine

## 2019-06-21 ENCOUNTER — Telehealth: Payer: Self-pay | Admitting: Gastroenterology

## 2019-06-21 NOTE — Telephone Encounter (Signed)

## 2019-06-22 ENCOUNTER — Ambulatory Visit (AMBULATORY_SURGERY_CENTER): Payer: BC Managed Care – PPO | Admitting: Gastroenterology

## 2019-06-22 ENCOUNTER — Encounter: Payer: Self-pay | Admitting: Gastroenterology

## 2019-06-22 ENCOUNTER — Other Ambulatory Visit: Payer: Self-pay

## 2019-06-22 VITALS — BP 111/73 | HR 58 | Temp 98.0°F | Resp 19 | Ht 60.0 in | Wt 147.0 lb

## 2019-06-22 DIAGNOSIS — K449 Diaphragmatic hernia without obstruction or gangrene: Secondary | ICD-10-CM

## 2019-06-22 DIAGNOSIS — R1310 Dysphagia, unspecified: Secondary | ICD-10-CM | POA: Diagnosis not present

## 2019-06-22 DIAGNOSIS — K219 Gastro-esophageal reflux disease without esophagitis: Secondary | ICD-10-CM | POA: Diagnosis not present

## 2019-06-22 MED ORDER — SODIUM CHLORIDE 0.9 % IV SOLN: 500.0000 mL | Freq: Once | INTRAVENOUS | Status: DC

## 2019-06-22 NOTE — Progress Notes (Signed)
To PACU, VSS. Report to [email or web address] 

## 2019-06-22 NOTE — Patient Instructions (Signed)
Information on esophagitis and strictures.  Dilation diet information.  Clear liquids until 11:45 today and then soft diet the rest of the day.  Resume regular diet tomorrow.  YOU HAD AN ENDOSCOPIC PROCEDURE TODAY AT [facility]:   Refer to the procedure report that was given to you for any specific questions about what was found during the examination.  If the procedure report does not answer your questions, please call your gastroenterologist to clarify.  If you requested that your care partner not be given the details of your procedure findings, then the procedure report has been included in a sealed envelope for you to review at your convenience later.  YOU SHOULD EXPECT: Some feelings of bloating in the abdomen. Passage of more gas than usual.  Walking can help get rid of the air that was put into your GI tract during the procedure and reduce the bloating. If you had a lower endoscopy (such as a colonoscopy or flexible sigmoidoscopy) you may notice spotting of blood in your stool or on the toilet paper. If you underwent a bowel prep for your procedure, you may not have a normal bowel movement for a few days.  Please Note:  You might notice some irritation and congestion in your nose or some drainage.  This is from the oxygen used during your procedure.  There is no need for concern and it should clear up in a day or so.  SYMPTOMS TO REPORT IMMEDIATELY:   Following upper endoscopy (EGD)  Vomiting of blood or coffee ground material  New chest pain or pain under the shoulder blades  Painful or persistently difficult swallowing  New shortness of breath  Fever of 100F or higher  Black, tarry-looking stools  For urgent or emergent issues, a gastroenterologist can be reached at any hour by calling [phone].   DIET:  We do recommend a small meal at first, but then you may proceed to your regular diet.  Drink plenty of fluids but you should avoid alcoholic beverages for 24  hours.  ACTIVITY:  You should plan to take it easy for the rest of today and you should NOT DRIVE or use heavy machinery until tomorrow (because of the sedation medicines used during the test).    FOLLOW UP: Our staff will call the number listed on your records 48-72 hours following your procedure to check on you and address any questions or concerns that you may have regarding the information given to you following your procedure. If we do not reach you, we will leave a message.  We will attempt to reach you two times.  During this call, we will ask if you have developed any symptoms of COVID 19. If you develop any symptoms (ie: fever, flu-like symptoms, shortness of breath, cough etc.) before then, please call [phone].  If you test positive for Covid 19 in the 2 weeks post procedure, please call and report this information to [place].    If any biopsies were taken you will be contacted by phone or by letter within the next 1-3 weeks.  Please call us at [phone] if you have not heard about the biopsies in 3 weeks.    SIGNATURES/CONFIDENTIALITY: You and/or your care partner have signed paperwork which will be entered into your electronic medical record.  These signatures attest to the fact that that the information above on your After Visit Summary has been reviewed and is understood.  Full responsibility of the confidentiality of this discharge  information lies with you and/or your care-partner. 

## 2019-06-22 NOTE — Progress Notes (Signed)
Called to room to assist during endoscopic procedure.  Patient ID and intended procedure confirmed with present staff. Received instructions for my participation in the procedure from the performing physician.  

## 2019-06-22 NOTE — Op Note (Signed)
[name] Procedure Date: 06/22/2019 9:28 AM MRN: [id number] Endoscopist: [name] , MD Age: 53 Referring MD:  Date of Birth: 1966/08/10 Gender: Female Account #: [id number] Procedure:                Upper GI endoscopy Indications:              Dysphagia, Gastroesophageal reflux disease Medicines:                Monitored Anesthesia Care Procedure:                Pre-Anesthesia Assessment:                           - Prior to the procedure, a History and Physical                            was performed, and patient medications and                            allergies were reviewed. The patient's tolerance of                            previous anesthesia was also reviewed. The risks                            and benefits of the procedure and the sedation                            options and risks were discussed with the patient.                            All questions were answered, and informed consent                            was obtained. Prior Anticoagulants: The patient has                            taken no previous anticoagulant or antiplatelet                            agents. ASA Grade Assessment: II - A patient with                            mild systemic disease. After reviewing the risks                            and benefits, the patient was deemed in                            satisfactory condition to undergo the procedure.                           After obtaining informed consent, the endoscope was  passed under direct vision. Throughout the                            procedure, the patient's blood pressure, pulse, and                            oxygen saturations were monitored continuously. The                            Endoscope was introduced through the mouth, and                            advanced to the second part of duodenum. The upper                            GI endoscopy  was accomplished without difficulty.                            The patient tolerated the procedure well. Scope In: Scope Out: Findings:                 The esophagus was normal. A guidewire was placed                            and the scope was withdrawn. Dilation was performed                            with a Savary dilator with no resistance at 16 mm.                           A small hiatal hernia was present.                           The exam of the stomach was otherwise normal.                           The examined duodenum was normal. Complications:            No immediate complications. Estimated Blood Loss:     Estimated blood loss: none. Impression:               - Normal esophagus. Dilated.                           - Small hiatal hernia.                           - Normal examined duodenum.                           - No specimens collected. Recommendation:           - Patient has a contact number available for                            emergencies. The signs and symptoms of potential  delayed complications were discussed with the                            patient. Return to normal activities tomorrow.                            Written discharge instructions were provided to the                            patient.                           - Continue present medications.                           - Clear liquid diet for 2 hours, then advance as                            tolerated to soft diet today.                           - Resume prior diet tomorrow. [name], MD 06/22/2019 9:44:28 AM This report has been signed electronically.

## 2019-06-22 NOTE — Progress Notes (Signed)
Pt's states no medical or surgical changes since previsit or office visit.  Temp-June, Vitals nancy

## 2019-06-24 ENCOUNTER — Telehealth: Payer: Self-pay | Admitting: *Deleted

## 2019-06-24 ENCOUNTER — Ambulatory Visit (HOSPITAL_COMMUNITY)
Admission: RE | Admit: 2019-06-24 | Discharge: 2019-06-24 | Disposition: A | Discharge status: Home / Self Care | Payer: BC Managed Care – PPO | Source: Ambulatory Visit | Attending: Gastroenterology | Admitting: Gastroenterology

## 2019-06-24 ENCOUNTER — Other Ambulatory Visit: Payer: Self-pay

## 2019-06-24 DIAGNOSIS — R918 Other nonspecific abnormal finding of lung field: Secondary | ICD-10-CM | POA: Diagnosis not present

## 2019-06-24 DIAGNOSIS — R079 Chest pain, unspecified: Secondary | ICD-10-CM | POA: Diagnosis not present

## 2019-06-24 MED ORDER — IOHEXOL 300 MG/ML  SOLN
150.0000 mL | Freq: Once | INTRAMUSCULAR | Status: AC | PRN
  Administered 2019-06-24: 200 mL via INTRAVENOUS

## 2019-06-24 NOTE — Telephone Encounter (Signed)
Left message on f/u call 

## 2019-06-24 NOTE — Telephone Encounter (Signed)
Sheri,  Please schedule stat CXR- PA & LAT and stat gastrograffin esophagram, both at hospital NPO until above completed Chest pain post EGD, dilation

## 2019-06-24 NOTE — Telephone Encounter (Signed)
  Follow up Call-  Call back number 06/22/2019 02/26/2018  Post procedure Call Back phone  # [phone]  Permission to leave phone message Yes Yes  Some recent data might be hidden     Patient questions:  Do you have a fever, pain , or abdominal swelling? Yes.   Pain Score  5 *  Have you tolerated food without any problems? Yes.    Have you been able to return to your normal activities? Yes.    Do you have any questions about your discharge instructions: Diet   No. Medications  No. Follow up visit  No.  Do you have questions or concerns about your Care? No.  Actions: * If pain score is 4 or above: Physician/ provider Notified : Lucio Edward, MD.  1. Have you developed a fever since your procedure? no  2.   Have you had an respiratory symptoms (SOB or cough) since your procedure? no  3.   Have you tested positive for COVID 19 since your procedure no  4.   Have you had any family members/close contacts diagnosed with the COVID 19 since your procedure?  no   If yes to any of these questions please route to Joylene John, RN and Alphonsa Gin, Therapist, sports.      Dr. Fuller Plan, Pt is having chest pain, "in the sternal area"- rating as "5-6".  This pain started on Wednesday morning.  No c/o SOB or difficulty swallowing.  Since pt's procedure, she has had constant diarrhea.   Pt has been able to eat.    Thanks, [employer]

## 2019-06-24 NOTE — Telephone Encounter (Signed)
Patient ate a muffin about an hour and a half ago. Patient has been scheduled for the water soluble esophagram and CXR at 2:00 at [facility]  She is asked to be NPO until she has had studies performed.  She understands to arrive in radiology at [facility]

## 2019-07-01 ENCOUNTER — Encounter: Payer: Self-pay | Admitting: Family Medicine

## 2019-07-01 ENCOUNTER — Ambulatory Visit (INDEPENDENT_AMBULATORY_CARE_PROVIDER_SITE_OTHER)
Admission: RE | Admit: 2019-07-01 | Discharge: 2019-07-01 | Disposition: A | Discharge status: Home / Self Care | Payer: BC Managed Care – PPO | Source: Ambulatory Visit | Attending: Family Medicine | Admitting: Family Medicine

## 2019-07-01 ENCOUNTER — Ambulatory Visit (INDEPENDENT_AMBULATORY_CARE_PROVIDER_SITE_OTHER): Payer: BC Managed Care – PPO | Admitting: Family Medicine

## 2019-07-01 ENCOUNTER — Other Ambulatory Visit: Payer: Self-pay

## 2019-07-01 VITALS — BP 120/80 | HR 68 | Ht 60.0 in | Wt 148.0 lb

## 2019-07-01 DIAGNOSIS — M7062 Trochanteric bursitis, left hip: Secondary | ICD-10-CM | POA: Diagnosis not present

## 2019-07-01 DIAGNOSIS — G8929 Other chronic pain: Secondary | ICD-10-CM

## 2019-07-01 DIAGNOSIS — M545 Low back pain, unspecified: Secondary | ICD-10-CM

## 2019-07-01 DIAGNOSIS — M7061 Trochanteric bursitis, right hip: Secondary | ICD-10-CM | POA: Diagnosis not present

## 2019-07-01 MED ORDER — GABAPENTIN 100 MG PO CAPS: 200.0000 mg | ORAL_CAPSULE | Freq: Every day | ORAL | 3 refills | Status: DC

## 2019-07-01 NOTE — Assessment & Plan Note (Signed)
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  Differential includes a lumbar radiculopathy and x-rays ordered today, started gabapentin at night, we discussed topical anti-inflammatories, icing regimen and over-the-counter medications.  Work with Product/process development scientist.  Follow-up with me again in 4 to 8 weeks if no improvement formal physical therapy injection

## 2019-07-01 NOTE — Patient Instructions (Addendum)
Good to see you.  Voltaren over the counter Ice 20 minutes 2 times daily. Usually after activity and before bed. Exercises 3 times a week.  Walk only 3-4 times a week Gabapentin 200 mg at night  See me again in 6 weeks

## 2019-07-01 NOTE — Progress Notes (Signed)
[name] [address] [place], [place] [address] Phone: [phone] Subjective:   [name] am serving as a Education administrator for Dr. Hulan Saas.  CC: Bilateral hip pain.  Back pain  [id number]  [name] is a 53 y.o. female coming in with complaint of bilateralhippain. Hips have gotten worse in the past 3 months. States that she also has lower back pain.   Onset- chronic  Location - lateral, groin/joint Duration-  Character- sharp, throbbing Aggravating factors- getting in and out of car, sitting and walking for long periods of time Reliving factors- ice, heat, topical (bio freeze, tiger balm), oral (Ibuprofen)  Therapies tried-  Severity- right now 5 at its worse 8.5     Past Medical History:  Diagnosis Date  . 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   . Allergy   . Anemia   . Asthma   . GERD (gastroesophageal reflux disease)   . Heart murmur   . Hypertension   . Positive TB test   . Shingles   . TOBACCO USE 03/22/2009   Qualifier: Diagnosis of  By: Regis Bill MD, [name]  Stopped Dec 12 2010     Past Surgical History:  Procedure Laterality Date  . ABDOMINAL HYSTERECTOMY    . BREAST SURGERY     cyst from left breast  . BUNIONECTOMY     both feet  . CHOLECYSTECTOMY    . CYSTECTOMY     left shoulder  . KNEE SURGERY Left   . PANENDOSCOPY    . TUBAL LIGATION    . uterine ablastion     and polyps removed  . Uterine polyps     Social History   Socioeconomic History  . Marital status: Divorced    Spouse name: Not on file  . Number of children: Not on file  . Years of education: Not on file  . Highest education level: Not on file  Occupational History  . Not on file  Social Needs  . Financial resource strain: Not on file  . Food insecurity    Worry: Not on file    Inability: Not on file  . Transportation needs    Medical: Not on file    Non-medical: Not on file  Tobacco Use  . Smoking status:  Former Smoker    Packs/day: 0.50    Years: 17.00    Pack years: 8.50    Types: Cigarettes    Quit date: 12/12/2010    Years since quitting: 8.5  . Smokeless tobacco: Never Used  Substance and Sexual Activity  . Alcohol use: No  . Drug use: No  . Sexual activity: Not on file  Lifestyle  . Physical activity    Days per week: Not on file    Minutes per session: Not on file  . Stress: Not on file  Relationships  . Social Herbalist on phone: Not on file    Gets together: Not on file    Attends religious service: Not on file    Active member of club or organization: Not on file    Attends meetings of clubs or organizations: Not on file    Relationship status: Not on file  Other Topics Concern  . 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 Diovan Hct [Valsartan-Hydrochlorothiazide] Swelling    Had facial swelling and throat itching after 2 doses of the generic had done well on the brand medicine for a while.  See ED visit February 2014  . Purified Water [Water, Sterile] Anaphylaxis  . Allegra [Fexofenadine] Hives  . Chocolate     triggers Asthma  . Cimetidine Hives    REACTION: unspecified  . Citric Acid     REACTION: swelling, hives, itching  . Coly-Mycin S Other (See Comments)    REACTION: pt reports she passed out  . Peanut-Containing Drug Products Swelling  . Amlodipine Other (See Comments)    At 5 mg dose; swelling   Family History  Problem Relation Age of Onset  . Hypertension Mother   . Alzheimer's disease Mother   . Hypertension Father   . Prostate cancer Father   . HIV Brother   . Lupus Sister   . Colon cancer Paternal Grandmother   . Esophageal cancer Neg Hx   . Liver cancer Neg Hx   . Pancreatic cancer Neg Hx   . Rectal cancer Neg Hx   . Stomach cancer Neg Hx       Current Outpatient Medications (Cardiovascular):  Marland Kitchen  EPINEPHrine 0.15 MG/0.15ML IJ injection, Inject 0.15 mLs (0.15 mg total) into the muscle as needed for anaphylaxis. .  hydrochlorothiazide (HYDRODIURIL) 25 MG tablet, TAKE ONE TABLET BY MOUTH ONCE DAILY .  valsartan (DIOVAN) 320 MG tablet, TAKE 1/2 TABLET BY MOUTH DAILY  Current Outpatient Medications (Respiratory):  .  albuterol (PROVENTIL) (2.5 MG/3ML) 0.083% nebulizer solution, Take 3 mLs (2.5 mg total) by nebulization every 6 (six) hours as needed for wheezing or shortness of breath. Marland Kitchen  albuterol (VENTOLIN HFA) 108 (90 Base) MCG/ACT inhaler, INHALE TWO PUFFS BY MOUTH EVERY 4 HOURS AS NEEDED ONLY IF YOU CANT CATCH YOUR BREATH .  budesonide-formoterol (SYMBICORT) 80-4.5 MCG/ACT inhaler, INHALE 2 PUFFS BY MOUTH INTO THE LUNGS TWICE DAILY .  fluticasone (FLONASE) 50 MCG/ACT nasal spray, Place 2 sprays into both nostrils daily.  Current Outpatient Medications (Analgesics):  .  ibuprofen (ADVIL,MOTRIN) 800 MG tablet, 1 po very 8-12 hours  For 10 days  Or as needed (Patient taking differently: Take 800 mg by mouth daily as needed for mild pain. 1 po very 8-12 hours  For 10 days  Or as needed)   Current Outpatient Medications (Other):  Marland Kitchen  Al Hyd-Mg Tr-Alg Ac-Sod Bicarb (GAVISCON-2 PO), Take by mouth daily. .  famotidine (PEPCID) 20 MG tablet, One at bedtime .  pantoprazole (PROTONIX) 40 MG tablet, Take 1 tablet (40 mg total) by mouth 2 (two) times daily before a meal. Take 30-60 min before first meal of the day .  Probiotic Product (PROBIOTIC-10 PO), Take 1 capsule by mouth daily. Marland Kitchen  Spacer/Aero-Holding Dorise Bullion, Use with HFA inhaler albuterol for asthma. .  gabapentin (NEURONTIN) 100 MG capsule, Take 2 capsules (200 mg total) by mouth at bedtime.    Past medical history, social, surgical and family history all reviewed in electronic medical record.  No pertanent information unless stated regarding to the chief complaint.   Review of  Systems:  No headache, visual changes, nausea, vomiting, diarrhea, constipation, dizziness, abdominal pain, skin rash, fevers, chills, night sweats, weight loss, swollen lymph nodes,  chest pain, shortness of breath, mood changes.  Positive muscle aches, body aches  Objective  Blood pressure 120/80, pulse 68, height 5' (1.524 m), weight 148  lb (67.1 kg), SpO2 98 %.    General: No apparent distress alert and oriented x3 mood and affect normal, dressed appropriately.  HEENT: Pupils equal, extraocular movements intact  Respiratory: Patient's speak in full sentences and does not appear short of breath  Cardiovascular: No lower extremity edema, non tender, no erythema  Skin: Warm dry intact with no signs of infection or rash on extremities or on axial skeleton.  Abdomen: Soft nontender  Neuro: Cranial nerves II through XII are intact, neurovascularly intact in all extremities with 2+ DTRs and 2+ pulses.  Lymph: No lymphadenopathy of posterior or anterior cervical chain or axillae bilaterally.  Gait antalgic MSK:  tender with full range of motion and good stability and symmetric strength and tone of shoulders, elbows, wrist, , knee and ankles bilaterally.  Back exam shows the patient is has mild loss of lordosis.  Patient does have diffuse tenderness in the paraspinal musculature.  Severe tenderness over the greater trochanteric area bilaterally right greater than left.  Negative straight leg test.  Tender to palpation over the greater trochanteric area more than anywhere else.  5-5 strength of lower extremities.  [id number]; 15 additional minutes spent for Therapeutic exercises as stated in above notes.  This included exercises focusing on stretching, strengthening, with significant focus on eccentric aspects.   Long term goals include an improvement in range of motion, strength, endurance as well as avoiding reinjury. Patient's frequency would include in 1-2 times a day, 3-5 times a week for a duration of  6-12 weeks.Hip strengthening exercises which included:  Pelvic tilt/bracing to help with proper recruitment of the lower abs and pelvic floor muscles  Glute strengthening to properly contract glutes without over-engaging low back and hamstrings - prone hip extension and glute bridge exercises Proper stretching techniques to increase effectiveness for the hip flexors, groin, quads, piriformic and low back when appropriate     Proper technique shown and discussed handout in great detail with ATC.  All questions were discussed and answered.       Impression and Recommendations:     This case required medical decision making of moderate complexity. The above documentation has been reviewed and is accurate and complete Lyndal Pulley, DO       Note: This dictation was prepared with Dragon dictation along with smaller phrase technology. Any transcriptional errors that result from this process are unintentional.

## 2019-07-07 ENCOUNTER — Encounter: Payer: Self-pay | Admitting: Family Medicine

## 2019-07-07 MED FILL — VALSARTAN 320 MG TAB: 320 | 30 days supply | Qty: 15 | Fill #1

## 2019-07-19 ENCOUNTER — Telehealth: Payer: Self-pay | Admitting: Internal Medicine

## 2019-07-19 MED ORDER — BUDESONIDE-FORMOTEROL FUMARATE 80-4.5 MCG/ACT IN AERO: INHALATION_SPRAY | RESPIRATORY_TRACT | 0 refills | Status: DC

## 2019-07-19 NOTE — Telephone Encounter (Signed)
PATIENT IS IN OFFICE.  States to pick up application for medication.

## 2019-07-19 NOTE — Telephone Encounter (Signed)
Went to lobby to find patient, unable to find. Talked with Maudie Mercury who took message. Patient turned in paperwork and was given what she needed already.  Will close message

## 2019-07-21 ENCOUNTER — Other Ambulatory Visit: Payer: Self-pay | Admitting: Internal Medicine

## 2019-07-21 DIAGNOSIS — Z20828 Contact with and (suspected) exposure to other viral communicable diseases: Secondary | ICD-10-CM | POA: Diagnosis not present

## 2019-07-21 MED ORDER — BUDESONIDE-FORMOTEROL FUMARATE 80-4.5 MCG/ACT IN AERO: INHALATION_SPRAY | RESPIRATORY_TRACT | 3 refills | Status: DC

## 2019-08-12 NOTE — Progress Notes (Signed)
[facility] [address], [place] [address] Phone: [phone] Subjective:   Fontaine No, am serving as a scribe for Dr. Hulan Saas.  I'm seeing this patient by the request  of:    CC: Hip pain follow-up  [id number] 07/01/2019 Patient does have a bursitis of the hips bilaterally.  Differential includes a lumbar radiculopathy and x-rays ordered today, started gabapentin at night, we discussed topical anti-inflammatories, icing regimen and over-the-counter medications.  Work with Product/process development scientist.  Follow-up with me again in 4 to 8 weeks if no improvement formal physical therapy injection  Update 08/13/2019 Timothy R Laswell is a 53 y.o. female coming in with complaint of bilateral hip pain. Patient states that she stopped walking due to pain in the hips. Pain in left has improved. Right hip seems to be worse. Pain radiates from glute to knee and anterior aspect of leg to foot. Tried using gabapentin but did have have any relief.  Patient states that unfortunately the right side seems to be worsening at the moment.  Feels like the left side was maybe compensating.     Past Medical History:  Diagnosis Date  . 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   . Allergy   . Anemia   . Asthma   . GERD (gastroesophageal reflux disease)   . Heart murmur   . Hypertension   . Positive TB test   . Shingles   . TOBACCO USE 03/22/2009   Qualifier: Diagnosis of  By: Regis Bill MD, [name]  Stopped Dec 12 2010     Past Surgical History:  Procedure Laterality Date  . ABDOMINAL HYSTERECTOMY    . BREAST SURGERY     cyst from left breast  . BUNIONECTOMY     both feet  . CHOLECYSTECTOMY    . CYSTECTOMY     left shoulder  . KNEE SURGERY Left   . PANENDOSCOPY    . TUBAL LIGATION    . uterine ablastion     and polyps removed  . Uterine polyps     Social History   Socioeconomic History  . Marital status: Divorced    Spouse name: Not on  file  . Number of children: Not on file  . Years of education: Not on file  . Highest education level: Not on file  Occupational History  . Not on file  Social Needs  . Financial resource strain: Not on file  . Food insecurity    Worry: Not on file    Inability: Not on file  . Transportation needs    Medical: Not on file    Non-medical: Not on file  Tobacco Use  . Smoking status: Former Smoker    Packs/day: 0.50    Years: 17.00    Pack years: 8.50    Types: Cigarettes    Quit date: 12/12/2010    Years since quitting: 8.6  . Smokeless tobacco: Never Used  Substance and Sexual Activity  . Alcohol use: No  . Drug use: No  . Sexual activity: Not on file  Lifestyle  . Physical activity    Days per week: Not on file    Minutes per session: Not on file  . Stress: Not on file  Relationships  . Social Herbalist on phone: Not on file    Gets together: Not on file    Attends religious service: Not on file    Active member  of club or organization: Not on file    Attends meetings of clubs or organizations: Not on file    Relationship status: Not on file  Other Topics Concern  . 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 Diovan Hct [Valsartan-Hydrochlorothiazide] Swelling    Had facial swelling and throat itching after 2 doses of the generic had done well on the brand medicine for a while.  See ED visit February 2014  . Purified Water [Water, Sterile] Anaphylaxis  . Allegra [Fexofenadine] Hives  . Chocolate     triggers Asthma  . Cimetidine Hives    REACTION: unspecified  . Citric Acid     REACTION: swelling, hives, itching  . Coly-Mycin S Other (See Comments)    REACTION: pt reports she passed out  . Peanut-Containing Drug Products Swelling  . Amlodipine Other (See Comments)    At 5 mg  dose; swelling   Family History  Problem Relation Age of Onset  . Hypertension Mother   . Alzheimer's disease Mother   . Hypertension Father   . Prostate cancer Father   . HIV Brother   . Lupus Sister   . Colon cancer Paternal Grandmother   . Esophageal cancer Neg Hx   . Liver cancer Neg Hx   . Pancreatic cancer Neg Hx   . Rectal cancer Neg Hx   . Stomach cancer Neg Hx      Current Outpatient Medications (Cardiovascular):  Marland Kitchen  EPINEPHrine 0.15 MG/0.15ML IJ injection, Inject 0.15 mLs (0.15 mg total) into the muscle as needed for anaphylaxis. .  hydrochlorothiazide (HYDRODIURIL) 25 MG tablet, TAKE ONE TABLET BY MOUTH ONCE DAILY .  valsartan (DIOVAN) 320 MG tablet, TAKE 1/2 TABLET BY MOUTH DAILY  Current Outpatient Medications (Respiratory):  .  albuterol (PROVENTIL) (2.5 MG/3ML) 0.083% nebulizer solution, Take 3 mLs (2.5 mg total) by nebulization every 6 (six) hours as needed for wheezing or shortness of breath. Marland Kitchen  albuterol (VENTOLIN HFA) 108 (90 Base) MCG/ACT inhaler, INHALE TWO PUFFS BY MOUTH EVERY 4 HOURS AS NEEDED ONLY IF YOU CANT CATCH YOUR BREATH .  budesonide-formoterol (SYMBICORT) 80-4.5 MCG/ACT inhaler, INHALE 2 PUFFS BY MOUTH INTO THE LUNGS TWICE DAILY .  fluticasone (FLONASE) 50 MCG/ACT nasal spray, Place 2 sprays into both nostrils daily.  Current Outpatient Medications (Analgesics):  .  ibuprofen (ADVIL,MOTRIN) 800 MG tablet, 1 po very 8-12 hours  For 10 days  Or as needed (Patient taking differently: Take 800 mg by mouth daily as needed for mild pain. 1 po very 8-12 hours  For 10 days  Or as needed)   Current Outpatient Medications (Other):  Marland Kitchen  Al Hyd-Mg Tr-Alg Ac-Sod Bicarb (GAVISCON-2 PO), Take by mouth daily. .  famotidine (PEPCID) 20 MG tablet, One at bedtime .  gabapentin (NEURONTIN) 100 MG capsule, Take 2 capsules (200 mg total) by mouth at bedtime. .  pantoprazole (PROTONIX) 40 MG tablet, Take 1 tablet (40 mg total) by mouth 2 (two) times daily before a meal.  Take 30-60 min before first meal of the day .  Probiotic Product (PROBIOTIC-10 PO), Take 1 capsule by mouth daily. Marland Kitchen  Spacer/Aero-Holding Dorise Bullion, Use with HFA inhaler albuterol for asthma.    Past medical history, social, surgical and family history all reviewed in electronic medical record.  No pertanent information unless stated regarding to the chief complaint.   Review of Systems:  No headache, visual changes, nausea, vomiting, diarrhea, constipation, dizziness, abdominal pain, skin rash, fevers, chills, night sweats, weight loss, swollen lymph nodes,  chest pain, shortness of breath, mood changes.  Positive muscle aches, body aches  Objective  Blood pressure 110/78, pulse 70, height 5' (1.524 m), weight 148 lb (67.1 kg), SpO2 98 %.    General: No apparent distress alert and oriented x3 mood and affect normal, dressed appropriately.  HEENT: Pupils equal, extraocular movements intact  Respiratory: Patient's speak in full sentences and does not appear short of breath  Cardiovascular: No lower extremity edema, non tender, no erythema  Skin: Warm dry intact with no signs of infection or rash on extremities or on axial skeleton.  Abdomen: Soft nontender  Neuro: Cranial nerves II through XII are intact, neurovascularly intact in all extremities with 2+ DTRs and 2+ pulses.  Lymph: No lymphadenopathy of posterior or anterior cervical chain or axillae bilaterally.  Gait antalgic gait MSK: Right hip exam does have more of a fullness of the lateral aspect.  Severely tender to even light palpation.  No rash noted.  Patient is severely positive Corky Sox test.  Negative straight leg test but does have some tightness of the lumbar spine.  Limited musculoskeletal ultrasound was performed and interpreted by Lyndal Pulley  Limited ultrasound shows the patient does have significant amounts of what appears to be subcutaneous tissue in the area.  Patient was severely tender.  No vascularity or  abnormal vascularity noted in the area though.    Impression and Recommendations:     This case required medical decision making of moderate complexity. The above documentation has been reviewed and is accurate and complete Lyndal Pulley, DO       Note: This dictation was prepared with Dragon dictation along with smaller phrase technology. Any transcriptional errors that result from this process are unintentional.

## 2019-08-13 ENCOUNTER — Other Ambulatory Visit (INDEPENDENT_AMBULATORY_CARE_PROVIDER_SITE_OTHER): Payer: BC Managed Care – PPO

## 2019-08-13 ENCOUNTER — Ambulatory Visit (INDEPENDENT_AMBULATORY_CARE_PROVIDER_SITE_OTHER): Payer: BC Managed Care – PPO | Admitting: Family Medicine

## 2019-08-13 ENCOUNTER — Other Ambulatory Visit: Payer: Self-pay

## 2019-08-13 ENCOUNTER — Encounter: Payer: Self-pay | Admitting: Family Medicine

## 2019-08-13 ENCOUNTER — Ambulatory Visit: Payer: Self-pay

## 2019-08-13 VITALS — BP 110/78 | HR 70 | Ht 60.0 in | Wt 148.0 lb

## 2019-08-13 DIAGNOSIS — M5416 Radiculopathy, lumbar region: Secondary | ICD-10-CM

## 2019-08-13 DIAGNOSIS — M2550 Pain in unspecified joint: Secondary | ICD-10-CM | POA: Diagnosis not present

## 2019-08-13 DIAGNOSIS — M25551 Pain in right hip: Secondary | ICD-10-CM

## 2019-08-13 LAB — CBC WITH DIFFERENTIAL/PLATELET
Basophils Absolute: 0 10*3/uL (ref 0.0–0.1)
Basophils Relative: 1 % (ref 0.0–3.0)
Eosinophils Absolute: 0.4 10*3/uL (ref 0.0–0.7)
Eosinophils Relative: 9.2 % — ABNORMAL HIGH (ref 0.0–5.0)
HCT: 38.7 % (ref 36.0–46.0)
Hemoglobin: 12.5 g/dL (ref 12.0–15.0)
Lymphocytes Relative: 38 % (ref 12.0–46.0)
Lymphs Abs: 1.7 10*3/uL (ref 0.7–4.0)
MCHC: 32.3 g/dL (ref 30.0–36.0)
MCV: 84.1 fl (ref 78.0–100.0)
Monocytes Absolute: 0.3 10*3/uL (ref 0.1–1.0)
Monocytes Relative: 7.1 % (ref 3.0–12.0)
Neutro Abs: 2 10*3/uL (ref 1.4–7.7)
Neutrophils Relative %: 44.7 % (ref 43.0–77.0)
Platelets: 318 10*3/uL (ref 150.0–400.0)
RBC: 4.6 Mil/uL (ref 3.87–5.11)
RDW: 15.5 % (ref 11.5–15.5)
WBC: 4.6 10*3/uL (ref 4.0–10.5)

## 2019-08-13 LAB — COMPREHENSIVE METABOLIC PANEL
ALT: 10 U/L (ref 0–35)
AST: 15 U/L (ref 0–37)
Albumin: 4.4 g/dL (ref 3.5–5.2)
Alkaline Phosphatase: 55 U/L (ref 39–117)
BUN: 15 mg/dL (ref 6–23)
CO2: 31 mEq/L (ref 19–32)
Calcium: 9.9 mg/dL (ref 8.4–10.5)
Chloride: 101 mEq/L (ref 96–112)
Creatinine, Ser: 1.08 mg/dL (ref 0.40–1.20)
GFR: 64.32 mL/min (ref >60.00)
Glucose, Bld: 88 mg/dL (ref 70–99)
Potassium: 3.4 mEq/L — ABNORMAL LOW (ref 3.5–5.1)
Sodium: 140 mEq/L (ref 135–145)
Total Bilirubin: 0.4 mg/dL (ref 0.2–1.2)
Total Protein: 7.6 g/dL (ref 6.0–8.3)

## 2019-08-13 LAB — C-REACTIVE PROTEIN: CRP: <1 mg/dL (ref 0.5–20.0)

## 2019-08-13 LAB — SEDIMENTATION RATE: Sed Rate: 19 mm/hr (ref 0–30)

## 2019-08-13 NOTE — Assessment & Plan Note (Signed)
Patient is here for right hip pain.  Discussed with patient in great length.  Seems to have more of a fullness on the lateral aspect of the hip that is worse than previous exam.  Does seem to be pain that is out of proportion.  I do believe that advanced imaging is warranted before any type of intervention to the amount of pain and the enlargement since last exam.  Patient's left hip seems to be improving at the moment.  We will get an MRI with and without contrast of the right hip and then also an MRI of the lumbar spine to further evaluate for some lumbar radiculopathy likely contributing to localize swelling makes me more concerned.  Follow-up with me otherwise after imaging we will discuss further evaluation and treatment.

## 2019-08-13 NOTE — Patient Instructions (Addendum)
MRI [facility]  Imaging [phone] We will be in touch with results

## 2019-08-14 ENCOUNTER — Encounter: Payer: Self-pay | Admitting: Family Medicine

## 2019-08-14 LAB — LACTATE DEHYDROGENASE: LDH: 172 U/L (ref 120–250)

## 2019-08-16 ENCOUNTER — Encounter: Payer: Self-pay | Admitting: Family Medicine

## 2019-08-16 MED ORDER — TIZANIDINE HCL 4 MG PO TABS: 4.0000 mg | ORAL_TABLET | Freq: Three times a day (TID) | ORAL | 2 refills | Status: AC | PRN

## 2019-08-19 ENCOUNTER — Other Ambulatory Visit: Payer: Self-pay | Admitting: Internal Medicine

## 2019-08-20 NOTE — Telephone Encounter (Signed)
So   I think  That you need to talk with your supervisor about the work place  am then   Make follow up with me  . To decide how to help. WP

## 2019-08-20 NOTE — Telephone Encounter (Signed)
MW please advise.  Thanks.  

## 2019-08-23 ENCOUNTER — Telehealth: Payer: Self-pay | Admitting: Internal Medicine

## 2019-08-23 MED ORDER — BUDESONIDE-FORMOTEROL FUMARATE 80-4.5 MCG/ACT IN AERO: INHALATION_SPRAY | RESPIRATORY_TRACT | 0 refills | Status: DC

## 2019-08-23 MED FILL — VALSARTAN 320 MG TAB: 320 | 30 days supply | Qty: 15 | Fill #2

## 2019-08-23 NOTE — Telephone Encounter (Signed)
MW please advise on employer note.  Thanks! ------------------------------------ Will you write a letter recommending this for my employer.    Thank you An[place]e

## 2019-08-23 NOTE — Telephone Encounter (Signed)
The amount of covid in our community is a concern and is the same risk to you as a disease as it was in the Spring so I would not recommend a change in your work status.  Ok to write above letter.

## 2019-08-23 NOTE — Telephone Encounter (Signed)
Sample of Symbicort left up front for pt. Pt aware. Nothing further needed at this time- will close encounter.

## 2019-08-23 NOTE — Telephone Encounter (Signed)
Pt has been scheduled for virtual

## 2019-08-23 NOTE — Telephone Encounter (Signed)
Called and spoke to patient. Patient stated she would like to come pick up the letter tomorrow. Letter written per Dr. Melvyn Novas. Nothing further is needed at this time.

## 2019-08-23 NOTE — Telephone Encounter (Signed)
Patient is calling to schedule appt for follow up on returning back to work. First available, with Dr. Regis Bill is 09/13/2019. Patient has not been working since [place] started in March.  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 Please advise CB- [phone]

## 2019-08-24 ENCOUNTER — Encounter: Payer: Self-pay | Admitting: Internal Medicine

## 2019-08-24 ENCOUNTER — Other Ambulatory Visit: Payer: Self-pay

## 2019-08-24 ENCOUNTER — Telehealth (INDEPENDENT_AMBULATORY_CARE_PROVIDER_SITE_OTHER): Payer: BC Managed Care – PPO | Admitting: Internal Medicine

## 2019-08-24 DIAGNOSIS — I10 Essential (primary) hypertension: Secondary | ICD-10-CM | POA: Diagnosis not present

## 2019-08-24 DIAGNOSIS — Z79899 Other long term (current) drug therapy: Secondary | ICD-10-CM | POA: Diagnosis not present

## 2019-08-24 DIAGNOSIS — J45991 Cough variant asthma: Secondary | ICD-10-CM | POA: Diagnosis not present

## 2019-08-24 NOTE — Progress Notes (Signed)
Virtual Visit via Video Note  I connected with@ on 08/24/19 at  2:30 PM EDT by a video enabled telemedicine application and verified that I am speaking with the correct person using two identifiers. Location patient: home Location provider:work office Persons participating in the virtual visit: patient, provider  [place] national recommendations  regarding COVID 19 pandemic   video visit is advised over in office visit for this patient.  Patient aware  of the limitations of evaluation and management by telemedicine and  availability of in person appointments. and agreed to proceed.   HPI: [name] presents for video visit to reevaluate her work situation with her history of asthma and hypertension. She did talk to her supervisor at this point there are 8 or 9 people in 1  large room with barrier cubicles same ventilation 5 to 6 feet apart can reach a person over to share paperwork.  There is no masking rules inside the workplace because the physical distancing was felt to be enough. 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  There are different people who work with the public without mask.  Is uncertain if there are other measures to prevent ill people in the building. ROS: See pertinent positives and negatives per HPI.  Her blood pressure and asthma been stable she is only had to use her rescue in nebulizer 2-3 times in the last number of months. She did get a letter from Dr. Melvyn Novas confirming that she would have risk in the workplace. Past Medical History:  Diagnosis Date  . 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   . Allergy   . Anemia   . Asthma   . GERD (gastroesophageal reflux disease)   . Heart murmur   . Hypertension   . Positive TB test   . Shingles   . TOBACCO USE 03/22/2009   Qualifier: Diagnosis of  By: Regis Bill MD, [name]  Stopped Dec 12 2010      Past Surgical History:  Procedure Laterality Date  .  ABDOMINAL HYSTERECTOMY    . BREAST SURGERY     cyst from left breast  . BUNIONECTOMY     both feet  . CHOLECYSTECTOMY    . CYSTECTOMY     left shoulder  . KNEE SURGERY Left   . PANENDOSCOPY    . TUBAL LIGATION    . uterine ablastion     and polyps removed  . Uterine polyps      Family History  Problem Relation Age of Onset  . Hypertension Mother   . Alzheimer's disease Mother   . Hypertension Father   . Prostate cancer Father   . HIV Brother   . Lupus Sister   . Colon cancer Paternal Grandmother   . Esophageal cancer Neg Hx   . Liver cancer Neg Hx   . Pancreatic cancer Neg Hx   . Rectal cancer Neg Hx   . Stomach cancer Neg Hx     Social History   Tobacco Use  . Smoking status: Former Smoker    Packs/day: 0.50    Years: 17.00    Pack years: 8.50    Types: Cigarettes    Quit date: 12/12/2010    Years since quitting: 8.7  . Smokeless tobacco: Never Used  Substance Use Topics  . Alcohol use: No  . Drug use: No      Current Outpatient Medications:  .  Al Hyd-Mg Tr-Alg Ac-Sod  Bicarb (GAVISCON-2 PO), Take by mouth daily., Disp: , Rfl:  .  albuterol (PROVENTIL) (2.5 MG/3ML) 0.083% nebulizer solution, Take 3 mLs (2.5 mg total) by nebulization every 6 (six) hours as needed for wheezing or shortness of breath., Disp: 125 mL, Rfl: 12 .  albuterol (VENTOLIN HFA) 108 (90 Base) MCG/ACT inhaler, INHALE TWO PUFFS BY MOUTH EVERY 4 HOURS AS NEEDED ONLY IF YOU CANT CATCH YOUR BREATH, Disp: 18 g, Rfl: 0 .  budesonide-formoterol (SYMBICORT) 80-4.5 MCG/ACT inhaler, INHALE 2 PUFFS BY MOUTH INTO THE LUNGS TWICE DAILY, Disp: 1 Inhaler, Rfl: 0 .  EPINEPHRINE 0.3 mg/0.3 mL IJ SOAJ injection, INJECT 1 SYRINGE INTO THE MUSCLE AS NEEDED FOR ANAPHYLAXIS, Disp: 2 each, Rfl: 1 .  famotidine (PEPCID) 20 MG tablet, One at bedtime, Disp: 60 tablet, Rfl: 11 .  fluticasone (FLONASE) 50 MCG/ACT nasal spray, Place 2 sprays into both nostrils daily., Disp: , Rfl:  .  gabapentin (NEURONTIN) 100 MG  capsule, Take 2 capsules (200 mg total) by mouth at bedtime., Disp: 180 capsule, Rfl: 3 .  hydrochlorothiazide (HYDRODIURIL) 25 MG tablet, TAKE ONE TABLET BY MOUTH ONCE DAILY, Disp: 30 tablet, Rfl: 4 .  ibuprofen (ADVIL,MOTRIN) 800 MG tablet, 1 po very 8-12 hours  For 10 days  Or as needed (Patient taking differently: Take 800 mg by mouth daily as needed for mild pain. 1 po very 8-12 hours  For 10 days  Or as needed), Disp: 40 tablet, Rfl: 1 .  pantoprazole (PROTONIX) 40 MG tablet, Take 1 tablet (40 mg total) by mouth 2 (two) times daily before a meal. Take 30-60 min before first meal of the day, Disp: 60 tablet, Rfl: 11 .  Probiotic Product (PROBIOTIC-10 PO), Take 1 capsule by mouth daily., Disp: , Rfl:  .  Spacer/Aero-Holding Chambers DEVI, Use with HFA inhaler albuterol for asthma., Disp: 1 each, Rfl: 0 .  tiZANidine (ZANAFLEX) 4 MG tablet, Take 1 tablet (4 mg total) by mouth every 8 (eight) hours as needed for muscle spasms., Disp: 90 tablet, Rfl: 2 .  valsartan (DIOVAN) 320 MG tablet, TAKE 1/2 TABLET BY MOUTH DAILY, Disp: 30 tablet, Rfl: 2  EXAM: BP Readings from Last 3 Encounters:  08/13/19 110/78  07/01/19 120/80  06/22/19 111/73    VITALS per patient if applicable: Blood pressure was reported as 110/70.  GENERAL: alert, oriented, appears well and in no acute distress  HEENT: atraumatic, conjunttiva clear, no obvious abnormalities on inspection of external nose and ears  NECK: normal movements of the head and neck  LUNGS: on inspection no signs of respiratory distress, breathing rate appears normal, no obvious gross SOB, gasping or wheezing  CV: no obvious cyanosis  MS: moves all visible extremities without noticeable abnormality  PSYCH/NEURO: pleasant and cooperative, no obvious depression or anxiety, speech and thought processing grossly intact Lab Results  Component Value Date   WBC 4.6 08/13/2019   HGB 12.5 08/13/2019   HCT 38.7 08/13/2019   PLT 318.0 08/13/2019    GLUCOSE 88 08/13/2019   CHOL 195 05/19/2018   TRIG 41.0 05/19/2018   HDL 83.80 05/19/2018   LDLCALC 103 (H) 05/19/2018   ALT 10 08/13/2019   AST 15 08/13/2019   NA 140 08/13/2019   K 3.4 (L) 08/13/2019   CL 101 08/13/2019   CREATININE 1.08 08/13/2019   BUN 15 08/13/2019   CO2 31 08/13/2019   TSH 0.72 05/19/2018   MICROALBUR 0.7 05/30/2009    ASSESSMENT AND PLAN:  Discussed the following assessment and plan:  ICD-10-CM   1. Cough variant asthma  J45.991   2. Essential hypertension  I10   3. Medication management  [phone]    Patient has done well at home with her asthma and hypertension.  We discussed at length her work situation and physical plant with 8 or 9 people in 1 lube cubicles shared air space although 5 to 6 feet apart no mask requirement. I agree with Dr. Melvyn Novas  that her exposure risk would be too high in his particular work setting and she would be at risk of complications. She will let us know if we need to write a different kind of documentation for her workplace.  Counseled.   Expectant management and discussion of plan and treatment with opportunity to ask questions and all were answered. The patient agreed with the plan and demonstrated an understanding of the instructions.   Shanon Ace, MD

## 2019-08-30 ENCOUNTER — Other Ambulatory Visit: Payer: BC Managed Care – PPO

## 2019-09-07 ENCOUNTER — Other Ambulatory Visit: Payer: Self-pay

## 2019-09-07 ENCOUNTER — Ambulatory Visit
Admission: RE | Admit: 2019-09-07 | Discharge: 2019-09-07 | Disposition: A | Discharge status: Home / Self Care | Payer: BC Managed Care – PPO | Source: Ambulatory Visit | Attending: Family Medicine | Admitting: Family Medicine

## 2019-09-07 ENCOUNTER — Telehealth: Payer: Self-pay | Admitting: Internal Medicine

## 2019-09-07 DIAGNOSIS — M5416 Radiculopathy, lumbar region: Secondary | ICD-10-CM

## 2019-09-07 DIAGNOSIS — M25551 Pain in right hip: Secondary | ICD-10-CM

## 2019-09-07 MED ORDER — BUDESONIDE-FORMOTEROL FUMARATE 160-4.5 MCG/ACT IN AERO: 2.0000 | INHALATION_SPRAY | Freq: Two times a day (BID) | RESPIRATORY_TRACT | 0 refills | Status: DC

## 2019-09-07 MED ORDER — GADOBENATE DIMEGLUMINE 529 MG/ML IV SOLN
13.0000 mL | Freq: Once | INTRAVENOUS | Status: AC | PRN
  Administered 2019-09-07: 13 mL via INTRAVENOUS

## 2019-09-07 NOTE — Telephone Encounter (Signed)
No symbicort 80 samples Let patient know.  Appointment made

## 2019-09-07 NOTE — Telephone Encounter (Signed)
Spoke with patient. I asked her if she wanted an appointment with Dr. Melvyn Novas due to not being able to afford symbicort since losing her job. She did not want an appointment at this time. She is going to call the [place] [facility] [place] to see if they will process her through now that she is off FMLA and no job or insurance.  Patient has 2.5 days of inhaler left.  Told her she could have a 2 week inhaler but after that we would not be getting mor in so we needed to make a change if she cannot afford the symbicort and [employer] is not going to help.   One 2 week sample placed up front for patient to pick up.  Patient is going to call back after talking with [employer].  Will route to MW as Juluis Rainier

## 2019-09-08 ENCOUNTER — Encounter: Payer: Self-pay | Admitting: Internal Medicine

## 2019-09-08 ENCOUNTER — Other Ambulatory Visit: Payer: Self-pay

## 2019-09-08 ENCOUNTER — Ambulatory Visit (INDEPENDENT_AMBULATORY_CARE_PROVIDER_SITE_OTHER): Payer: BC Managed Care – PPO | Admitting: Internal Medicine

## 2019-09-08 VITALS — BP 142/98 | HR 63 | Temp 98.0°F | Ht 60.0 in | Wt 146.0 lb

## 2019-09-08 DIAGNOSIS — M7061 Trochanteric bursitis, right hip: Secondary | ICD-10-CM | POA: Diagnosis not present

## 2019-09-08 DIAGNOSIS — J45901 Unspecified asthma with (acute) exacerbation: Secondary | ICD-10-CM | POA: Diagnosis not present

## 2019-09-08 DIAGNOSIS — M7062 Trochanteric bursitis, left hip: Secondary | ICD-10-CM

## 2019-09-08 MED ORDER — METHYLPREDNISOLONE ACETATE 80 MG/ML IJ SUSP
120.0000 mg | INTRAMUSCULAR | Status: AC
  Administered 2019-09-08: 120 mg via INTRAMUSCULAR

## 2019-09-08 MED ORDER — BUDESONIDE-FORMOTEROL FUMARATE 160-4.5 MCG/ACT IN AERO: 2.0000 | INHALATION_SPRAY | Freq: Two times a day (BID) | RESPIRATORY_TRACT | 0 refills | Status: DC

## 2019-09-08 NOTE — Assessment & Plan Note (Signed)
Mri reviewed from 09/07/2019   rec depomedrol 120 mg IM R hip area

## 2019-09-08 NOTE — Patient Instructions (Addendum)
Ok to use symbicort 160 one twice daily until you get a supply of the symbicort 80  Which is ok to adjust so you're taking up to 2 pffs every 12 hours  - in event of a flare 2 puffs every 12 hours for at least a week before you taper it.    Please schedule a follow up visit in 6 months but call sooner if needed

## 2019-09-08 NOTE — Progress Notes (Signed)
Subjective:    [name] ID: [name], female    DOB: [birth year], 53 y.o.   MRN: [id number]    Brief [name] profile:  50  yobf  Quit smoking 2012 with onset of variable sob 2010 but nl pfts while on symbicort 02/20/11    History of Present Illness  January 08, 2011 ov cc worse dyspnea, wheezing no cough x 2 weeks no better p inalers, feels something stuck in ssn but swallowing ok. general chest tightness not better with saba.  [name] Instructions:  1) Work on inhaler technique: relax and blow all the way out then take a nice smooth deep breath back in, triggering the inhaler at same time you start breathing in  2) Symbicort 2 puffs first thing in am and 2 puffs again in pm about 12 hours later  3) Change nexium Take one 30-60 min before first meal of the day and add pepcid 20 mg one at bedtime  4) GERD (REFLUX)          02/12/2018  f/u ov/Fabian Coca re: ? Flare of asthma  Not even 50% better p er rx x 2 in Feb 2019  Chief Complaint  [name] presents with  . Pulmonary Consult    Self referral.  [name] c/o SOB x 3 months "all the time"- with or without any exertion. [name] states when [name] lies down [name] hears "crackling noise".  [name] also c/o non prod cough- usually worse at night and can be triggered by cleaning products. [name] has been using proair 3-4 x per day, but none in the past wk.   Dyspnea:  Variably sob with exertion / prior to Nov 04 2017 was fine with good aerobic capacity consistently fast walk up hills off maint rx x years on otc nexium only bedtime and variably bad breath/ hb on this rx  Cough: 30 m to an hour p hs/ not as common later or early am /worse with exp to clearing products Sleep: most nights ok SABA use:  Way over using now where wasn't using anything prior to Nov 04 2017 and not helping rec Plan A = Automatic = Symbicort 80 Take 2 puffs first thing in am and then another 2 puffs about 12 hours later.  Work on inhaler technique:  Plan B = Backup Only use your  albuterol(Proventil/Proair/Ventolin)  as a rescue medication Protonix Take 30- 60 min before your first and last meals of the day  GERD diet   03/19/2018  f/u ov/Sheridan Hew re:  S/p asthma flare, back in control  Chief Complaint  [name] presents with  . Follow-up    Breathing is much improved and cough has resolved. [name] has only had to use [name] albuterol inhaler once since [name] last visit.    Dyspnea:  Ok including steps  Cough: no Sleep: ok  SABA use:  Only x one  rec Please schedule a follow up visit in 6  months but call sooner if needed  Continue the reflux diet Try off protonix and take over the counter  pepcid 20 mg after bfast and supper x 1 week then just after bfast x one week then off pepcid and if can't taper off then let your Primary care doctor know for purposes of refilling the protonix or taking nexium as needed    05/26/2018 acute extended ov/Jozalynn Noyce re: cough   Chief Complaint  [name] presents with  . Acute Visit    asthma flare, cough, SOB, had to call the ambulance to [name]  job for an asthma attack , ran out of symbicort which may have caused asthma flare, just started protonix again  tapered off the protonix and maintained on pepcid but started cough/ rattling breathing w/in a week of the change and doe x steps despite maint on symb 80 2bid so refilled ppi bid ac as of 05/18/18 and yet no improvement initially/ some better on day of ov until starts dry  coughing fits Had improved to not needing any albuterol prior to above flare  Since onset of flare having trouble lying flat so props up on 5-6 pillows with freq noct awakening only some better p saba.  rec Plan A = Automatic = Symbicort 80 Take 2 puffs first thing in am and then another 2 puffs about 12 hours later.  Continue protonix 40 mg Take 30- 60 min before your first and last meals of the day  Plan B = Backup Only use your albuterol as a rescue medication Take delsym two tsp every 12 hours and supplement if needed with   tramadol 50 mg up to 2 every 4 hours Once you have eliminated the cough for 3 straight days try reducing the tramadol first,  then the delsym as tolerated.   For drainage / throat tickle try take CHLORPHENIRAMINE  4 mg -  Please schedule a follow up office visit in 4 weeks, call sooner if needed with all medications /inhalers/ solutions in hand  >  /22/19  NP eval/rx med calendar     11/11/2018  f/u ov/Tieisha Darden re: cough flared off ppi  Chief Complaint  [name] presents with  . Follow-up    Breathing is doing well. [name] rarely uses [name] albuterol inhaler.   Dyspnea:  Not limited by breathing from desired activities   Cough: none Sleeping: fine flat SABA use: very rare 02: none rec Change to Pantoprazole (protonix) 40 mg   Take  30-60 min before first meal of the day and Pepcid (famotidine)  20 mg one after supper x 2 weeks then stop protonix and take pepcid 20 mg after bfast and supper x 2 weeks then just after supper x 2 weeks then stop and call if cough or breathing get worse with taper     05/12/2019  f/u ov/Amira Podolak re: flare of cough vaiant asthma = cough chest heavy on Pepcid  40 mg one at bedtime  Chief Complaint  [name] presents with  . Follow-up    Had episode of increased SOB approx 2 wks ago- went to [facility] and given neb to take home. [name] used this every night for approx 1 wk to help with heaviness in [name] chest.   Dyspnea:  Back to rapid walking up 4 miles per day p prednsione/ h2 bid  Cough: resolved p prednisone  Sleeping: on slt hob elevation SABA use: none now  02: none rec For flares of breathing / coughing > protonix 40 mg Take 30-60 min before first meal of the day until 100% better x 2 weeks then ok to take as needed Continue pepcid 40 mg after supper daily or an hour before bed which is easier Plan A = Automatic = Symbicort 80 Take 2 puffs first thing in am and then another 2 puffs about 12 hours later.  Plan B = Backup Only use your albuterol inhaler as a rescue  medication Plan C = Crisis - only use your albuterol nebulizer if you first try Plan B   09/08/2019  f/u ov/Earle Troiano re: cough variant asthma Chief  Complaint  [name] presents with  . Follow-up    Unable to afford symbicort. [name] called [facility] and they told [name] to send in a letter.   Dyspnea:  Slowed by  Back pain rad down leg on R  Cough: gone Sleeping: no resp symptoms with hob up on bed blocks SABA use: min need as long as on symb 80 2bid 02: no   No obvious day to day or daytime variability or assoc excess/ purulent sputum or mucus plugs or hemoptysis or cp or chest tightness, subjective wheeze or overt sinus or hb symptoms.   Sleeping  without nocturnal  or early am exacerbation  of respiratory  c/o's or need for noct saba. Also denies any obvious fluctuation of symptoms with weather or environmental changes or other aggravating or alleviating factors except as outlined above   No unusual exposure hx or h/o childhood pna/ asthma or knowledge of premature birth.  Current Allergies, Complete Past Medical History, Past Surgical History, Family History, and Social History were reviewed in [employer] record.  ROS  The following are not active complaints unless bolded Hoarseness, sore throat, dysphagia, dental problems, itching, sneezing,  nasal congestion or discharge of excess mucus or purulent secretions, ear ache,   fever, chills, sweats, unintended wt loss or wt gain, classically pleuritic or exertional cp,  orthopnea pnd or arm/hand swelling  or leg swelling, presyncope, palpitations, abdominal pain, anorexia, nausea, vomiting, diarrhea  or change in bowel habits or change in bladder habits, change in stools or change in urine, dysuria, hematuria,  rash, arthralgias, visual complaints, headache, numbness, weakness or ataxia or problems with walking or coordination,  change in mood or  memory.        Current Meds  Medication Sig  . Al Hyd-Mg Tr-Alg Ac-Sod  Bicarb (GAVISCON-2 PO) Take by mouth daily.  Marland Kitchen albuterol (PROVENTIL) (2.5 MG/3ML) 0.083% nebulizer solution Take 3 mLs (2.5 mg total) by nebulization every 6 (six) hours as needed for wheezing or shortness of breath.  Marland Kitchen albuterol (VENTOLIN HFA) 108 (90 Base) MCG/ACT inhaler INHALE TWO PUFFS BY MOUTH EVERY 4 HOURS AS NEEDED ONLY IF YOU CANT CATCH YOUR BREATH  . budesonide-formoterol (SYMBICORT) 80-4.5 MCG/ACT inhaler INHALE 2 PUFFS BY MOUTH INTO THE LUNGS TWICE DAILY  . EPINEPHRINE 0.3 mg/0.3 mL IJ SOAJ injection INJECT 1 SYRINGE INTO THE MUSCLE AS NEEDED FOR ANAPHYLAXIS  . famotidine (PEPCID) 20 MG tablet One at bedtime  . fluticasone (FLONASE) 50 MCG/ACT nasal spray Place 2 sprays into both nostrils daily.  Marland Kitchen gabapentin (NEURONTIN) 100 MG capsule Take 2 capsules (200 mg total) by mouth at bedtime.  . hydrochlorothiazide (HYDRODIURIL) 25 MG tablet TAKE ONE TABLET BY MOUTH ONCE DAILY  . ibuprofen (ADVIL,MOTRIN) 800 MG tablet 1 po very 8-12 hours  For 10 days  Or as needed ([name] taking differently: Take 800 mg by mouth daily as needed for mild pain. 1 po very 8-12 hours  For 10 days  Or as needed)  . pantoprazole (PROTONIX) 40 MG tablet Take 1 tablet (40 mg total) by mouth 2 (two) times daily before a meal. Take 30-60 min before first meal of the day  . Probiotic Product (PROBIOTIC-10 PO) Take 1 capsule by mouth daily.  Marland Kitchen Spacer/Aero-Holding Dorise Bullion Use with HFA inhaler albuterol for asthma.  Marland Kitchen tiZANidine (ZANAFLEX) 4 MG tablet Take 1 tablet (4 mg total) by mouth every 8 (eight) hours as needed for muscle spasms.  . 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 Quit 12/12/10. Smoked for approx 10 yrs up to 1/4 ppd  Alcohol use-yes  employed [employer]               Objective:   Physical Exam   amb pleasant bf nad  Vital signs reviewed - Note on arrival 02 sats  100% on RA with bp 142/98   09/08/2019        146   05/12/19 150 lb (68 kg)  04/27/19 132 lb (59.9 kg)  11/11/18 141 lb (64 kg)     HEENT : pt wearing mask not removed for exam due to covid -19 concerns.    NECK :  without JVD/Nodes/TM/ nl carotid upstrokes bilaterally   LUNGS: no acc muscle use,  Nl contour chest which is clear to A and P bilaterally without cough on insp or exp maneuvers   CV:  RRR  no s3 or murmur or increase in P2, and no edema   ABD:  soft and nontender with nl inspiratory excursion in the supine position. No bruits or organomegaly appreciated, bowel sounds nl  MS:  Nl gait/ ext warm without deformities, calf tenderness, cyanosis or clubbing Pos slr on R   SKIN: warm and dry without lesions    NEURO:  alert, approp, nl sensorium with  no motor or cerebellar deficits apparent.         Assessment & Plan:

## 2019-09-08 NOTE — Assessment & Plan Note (Signed)
Onset 2010  - PFT's wnl 02/20/11  While on symbicort with improved symptom control > f/u pulm prn - FENO 02/12/2018  =   62 on symb 80 2bid with poor hfa - Spirometry 02/12/2018  Nl   - 02/12/2018    continue symb 80 2bid and add max gerd rx  - Allergy profile 02/12/18  >  Eos 0.4 /  IgE  50  RAST Pos grass > dog  - 03/19/2018 try to taper off gerd rx > flared 05/2018 so resumed - FENO 05/26/2018  =   25 on symb 80 2bid  -  11/11/2018  After extensive coaching inhaler device,  effectiveness =    90%  As long as has access to symb 80 >  All goals of chronic asthma control met including optimal function and elimination of symptoms with minimal need for rescue therapy.  Contingencies discussed in full including contacting this office immediately if not controlling the symptoms using the rule of two's.     Based on two studies from NEJM  378; 20 p 1865 (2018) and 380 : p2020-30 (2019) in pts with mild asthma it is reasonable to use low dose symbicort eg 80 2bid "prn" flare in this setting but I emphasized this was only shown with symbicort and takes advantage of the rapid onset of action but is not the same as "rescue therapy" but can be stopped once the acute symptoms have resolved and the need for rescue has been minimized (< 2 x weekly)   - this may result in significant cost savings since she is having trouble affording it.

## 2019-09-09 ENCOUNTER — Encounter: Payer: Self-pay | Admitting: Internal Medicine

## 2019-09-10 ENCOUNTER — Encounter: Payer: Self-pay | Admitting: Family Medicine

## 2019-09-11 ENCOUNTER — Encounter: Payer: Self-pay | Admitting: Family Medicine

## 2019-09-16 ENCOUNTER — Other Ambulatory Visit: Payer: BC Managed Care – PPO

## 2019-09-18 ENCOUNTER — Other Ambulatory Visit: Payer: BC Managed Care – PPO

## 2019-09-27 ENCOUNTER — Telehealth: Payer: Self-pay

## 2019-09-27 ENCOUNTER — Telehealth: Payer: Self-pay | Admitting: Internal Medicine

## 2019-09-27 NOTE — Telephone Encounter (Signed)
Called and spoke with pt who stated she was tested for covid Thursday 11/19 and found out yesterday 11/22 that the results were positive.  Pt is wanting advise of what she can do to take care of herself. I did tell pt to make sure she is quarantining, staying well hydrated, monitoring temps.  Dr. Melvyn Novas, please advise on any other advice for pt.Thanks!

## 2019-09-27 NOTE — Telephone Encounter (Signed)
There is no standard for uncomplicated Covid 19 - if resp symptoms become a lot  more severe or if 02 sats drop to less than 90% (she'll need to access an oximeter to monitor) then she will need to go the ER for eval for admission (there is a new antibody treatment that may be available for outpts but only when they are showing signs of deterioration and even they would only be available thru the er to a select few who meet their criteria).  No change in resp rx, call if any other info needed and good luck!

## 2019-09-27 NOTE — Telephone Encounter (Signed)
Called and spoke with pt letting her know the info stated by MW. Pt verbalized understanding. Nothing further needed. 

## 2019-09-27 NOTE — Telephone Encounter (Signed)
Copied from [place] [id number]. Topic: General - Inquiry >> Sep 27, 2019 10:11 AM Percell Belt A wrote: Reason for CRM: pt called in and stated that she tested positive though [facility] for the Cov[facility]. She is tired , no other symptoms.  She just wanted to know what Dr Regis Bill Advises  Best number [phone]

## 2019-09-28 NOTE — Telephone Encounter (Signed)
   4:43 PM Note   I reviewed   Dr Morrison Old note and agree  . She should wait at home and if gets short of breath and O2 level drops then need to seek Ed care but hopefully not need to do this. Let us know if need a virtual visit      And yes stay away from your family as best possible.

## 2019-09-28 NOTE — Telephone Encounter (Signed)
Given pt information and patient has understanding

## 2019-09-28 NOTE — Telephone Encounter (Signed)
Called pt.. see phone note. °

## 2019-09-28 NOTE — Telephone Encounter (Signed)
I reviewed   Dr Morrison Old note and agree  . She should wait at home and if gets short of breath and O2 level drops then need to seek Ed care but hopefully not need to do this. Let us know if need a virtual visit

## 2019-10-16 IMAGING — DX CHEST - 2 VIEW
2 series · 2 of 2 positions shown · non-contrast
Comparison: 08/19/2018

CLINICAL DATA: 53-year-old female with a history of chest pain

EXAM:
CHEST - 2 VIEW

[chest pa]
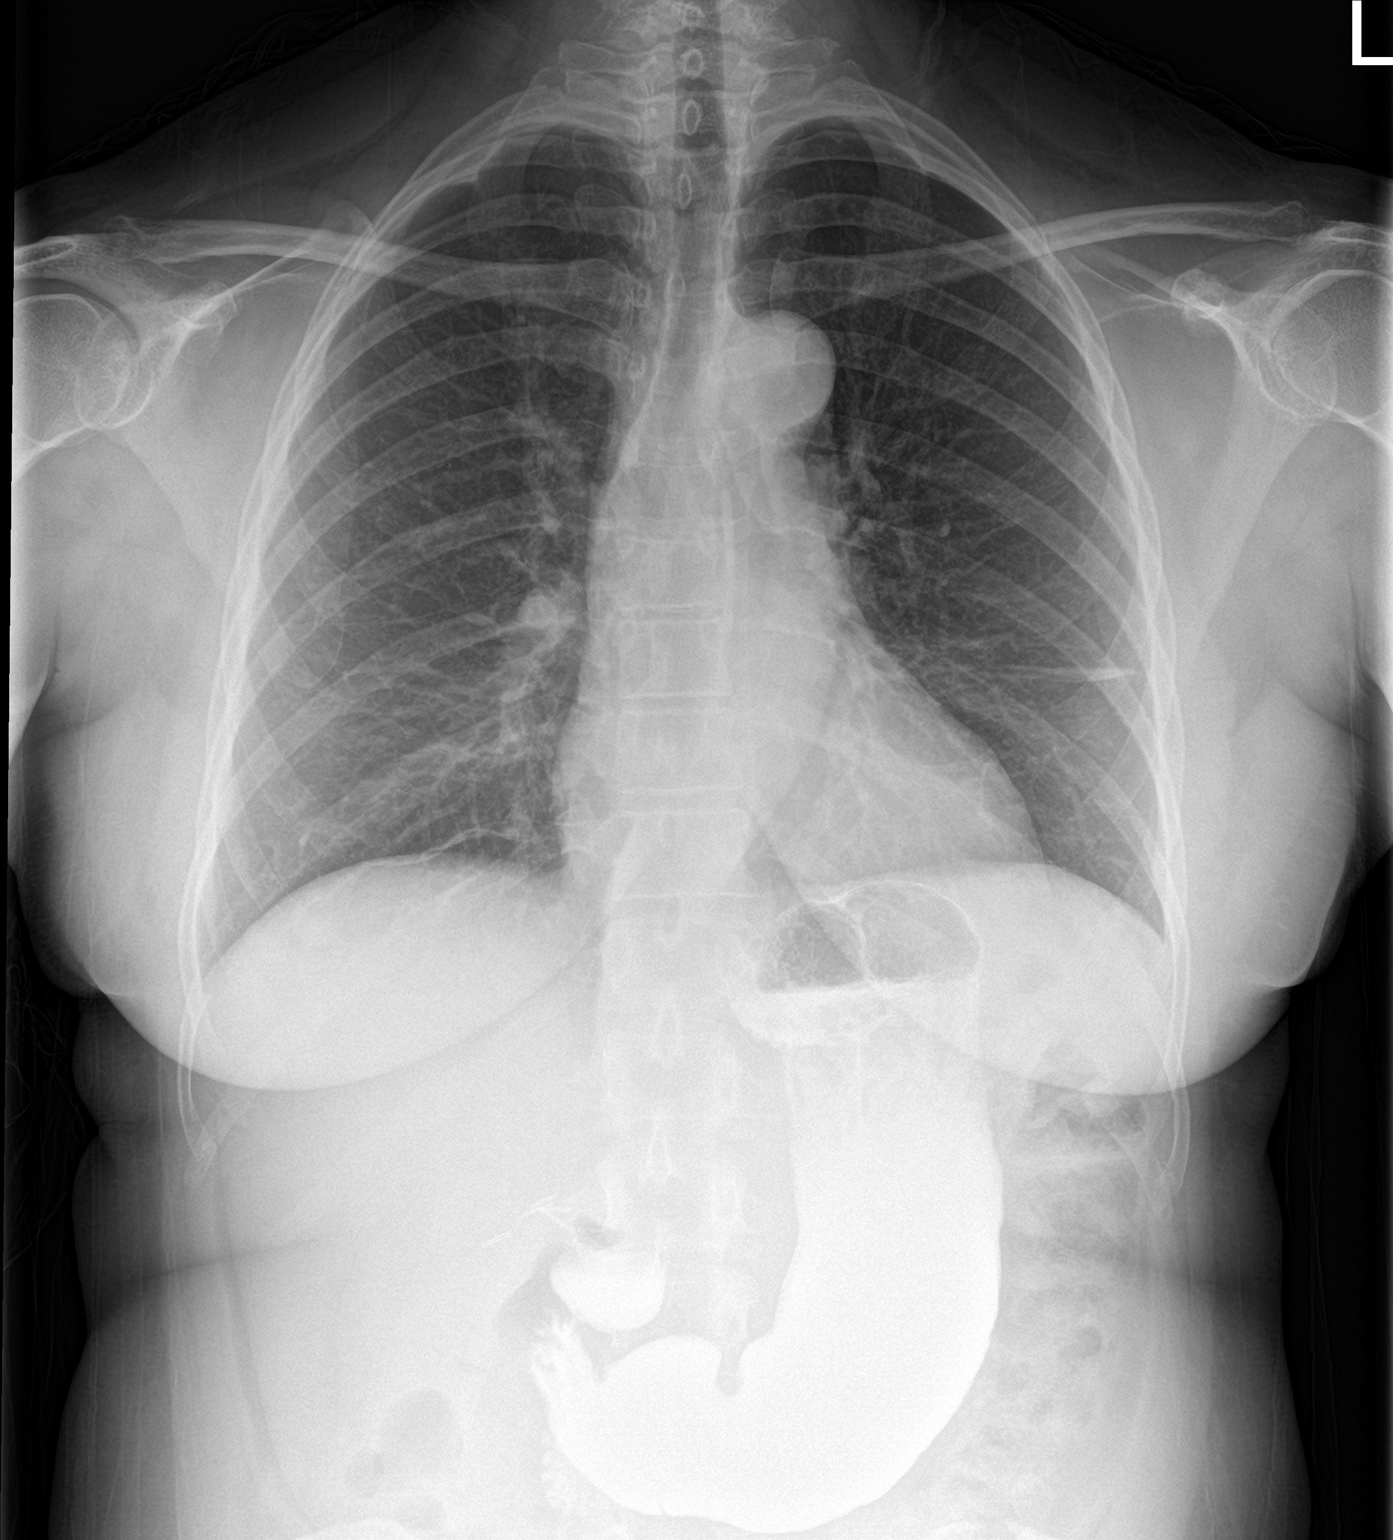

[chest lat]
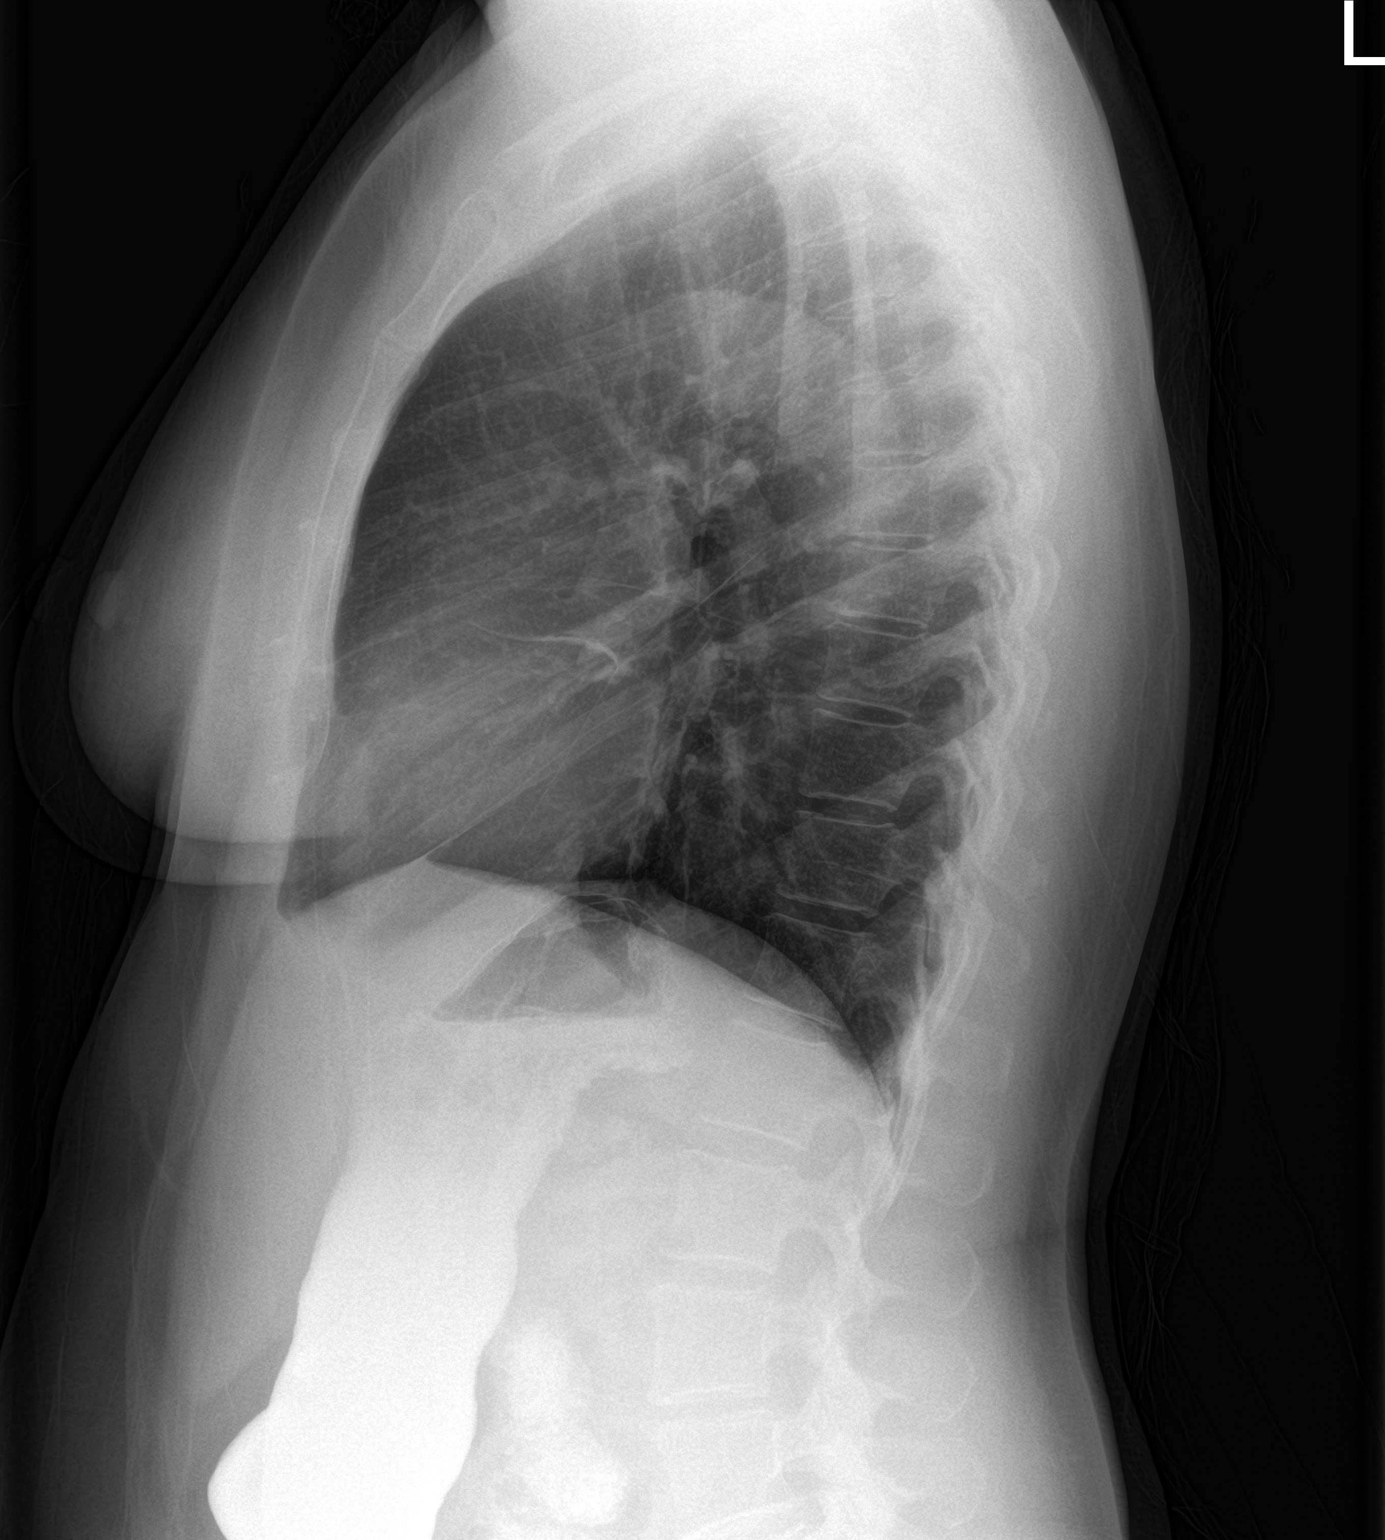

[2 of 2 positions shown; findings below may reference images not displayed]

FINDINGS: Cardiomediastinal silhouette unchanged in size and contour. No
evidence of central vascular congestion. No pneumothorax or pleural
effusion. No confluent airspace disease. Linear opacity in the left
mid lung is unchanged from the comparison compatible with scarring.
No displaced fracture.

Retained enteric contrast in the stomach.  Cholecystectomy
IMPRESSION: Negative for acute cardiopulmonary disease

## 2019-10-23 IMAGING — DX LUMBAR SPINE - COMPLETE 4+ VIEW
5 series · 5 of 5 positions shown · non-contrast
Comparison: None.

CLINICAL DATA: Chronic lumbago

EXAM:
LUMBAR SPINE - COMPLETE 4+ VIEW

[l-spine ap]
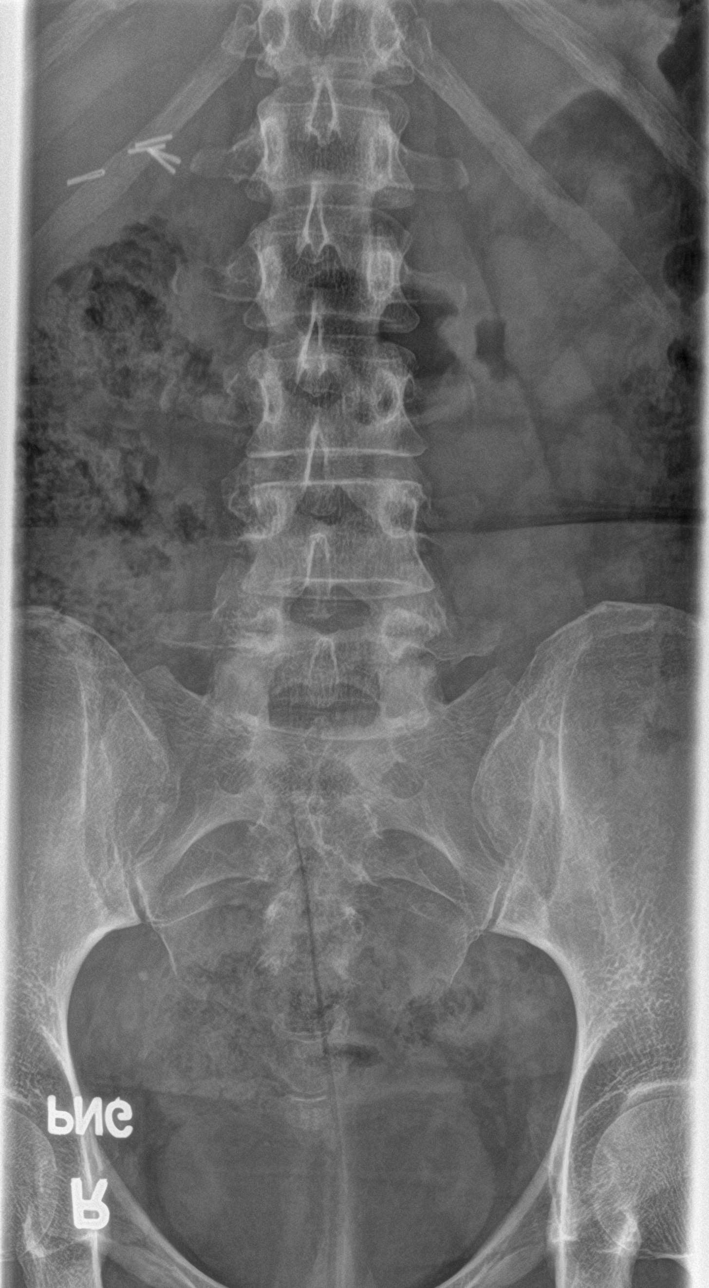

[l-spine obl (1 of 2)]
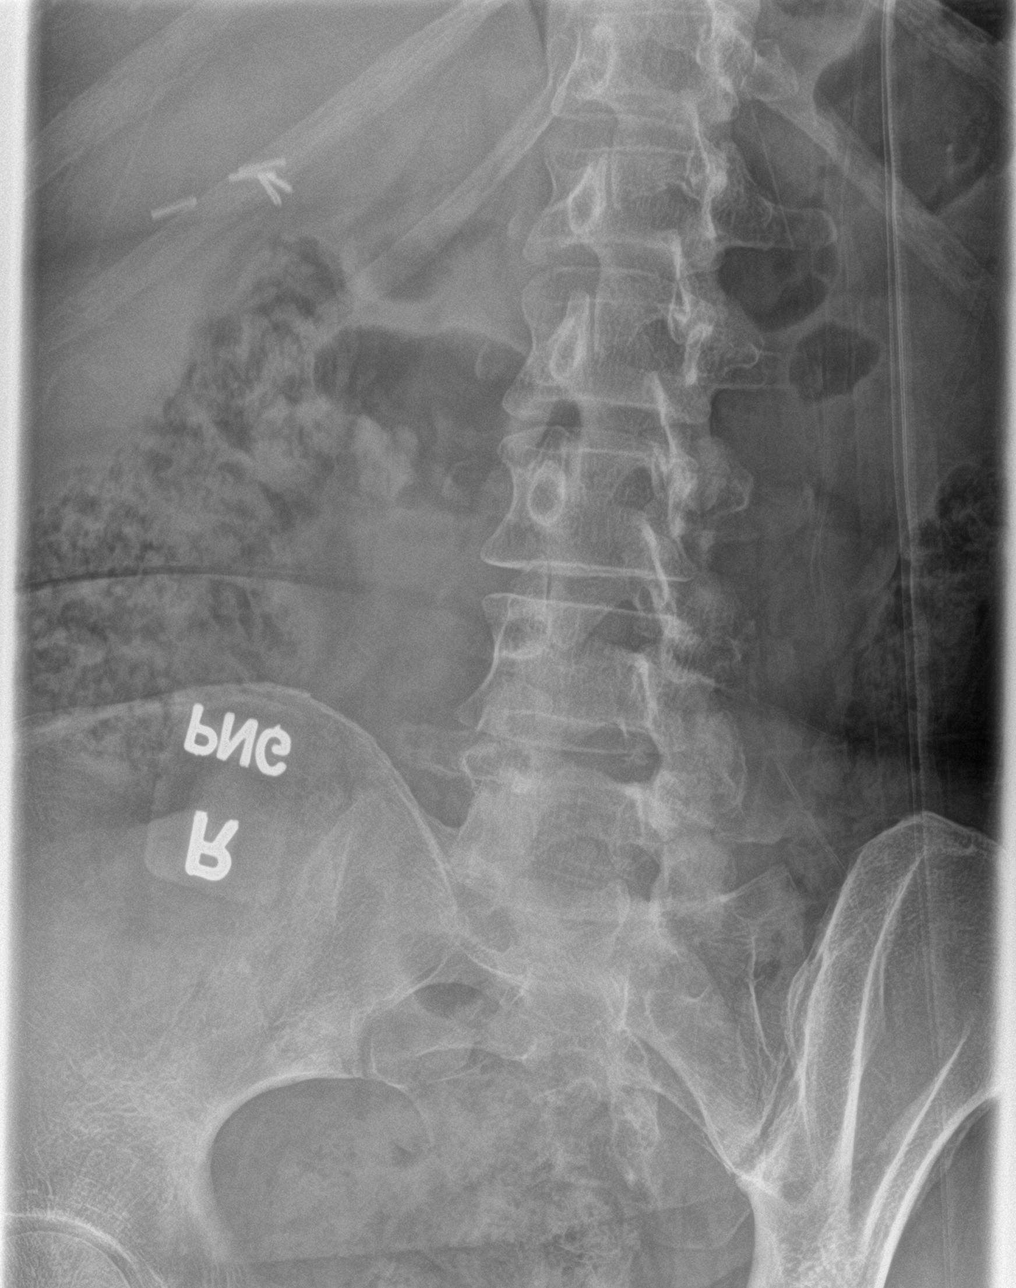

[l-spine obl (2 of 2)]
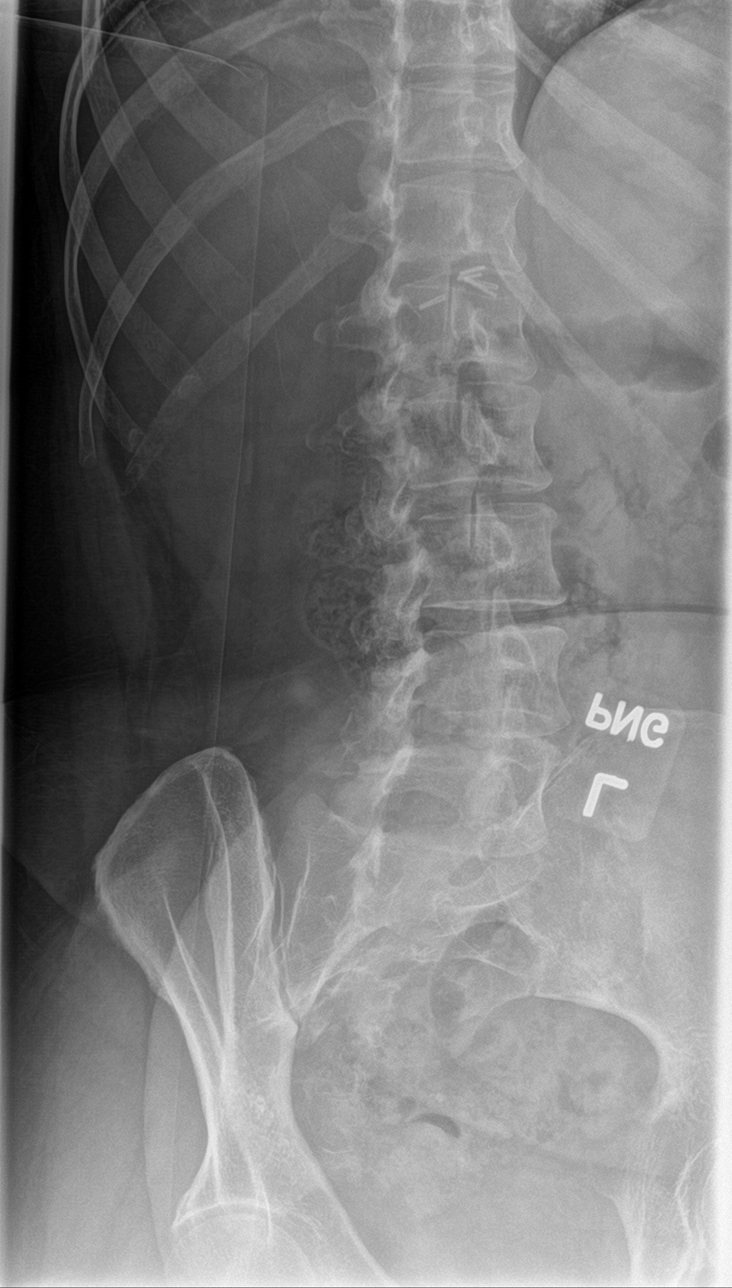

[l-spine lat]
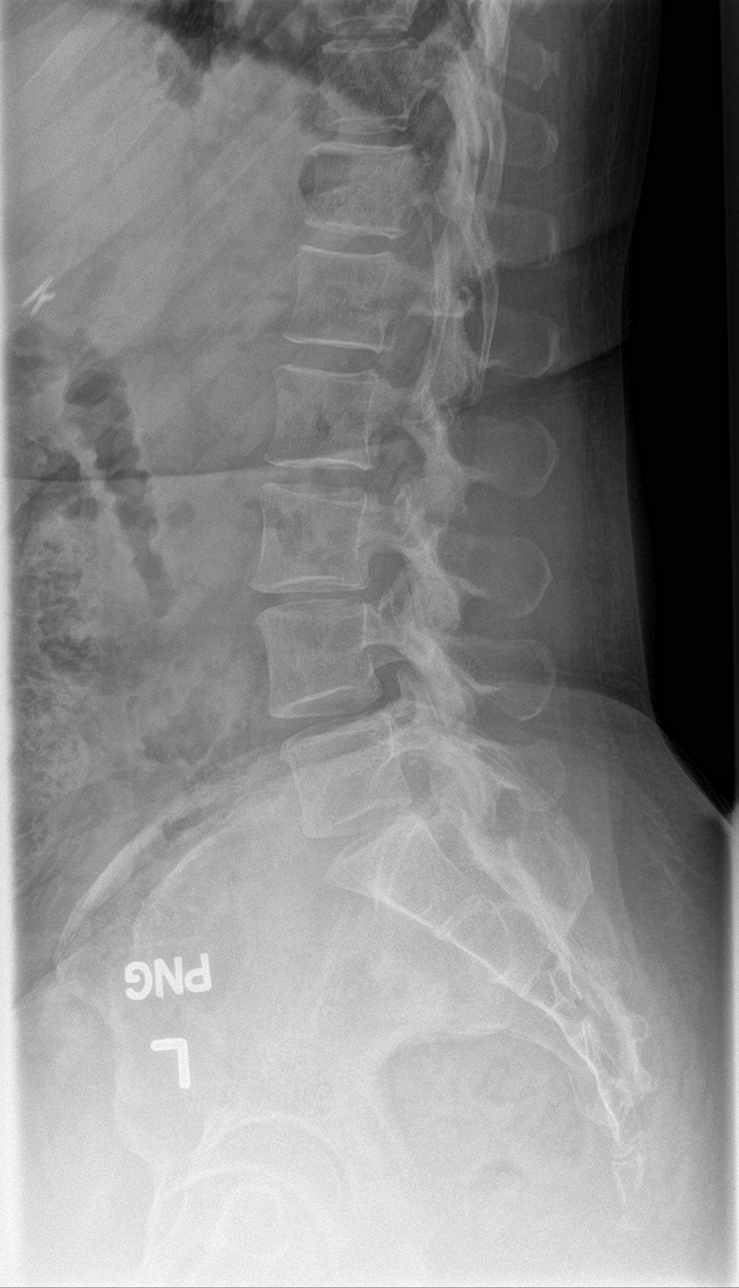

[l-spine spot]
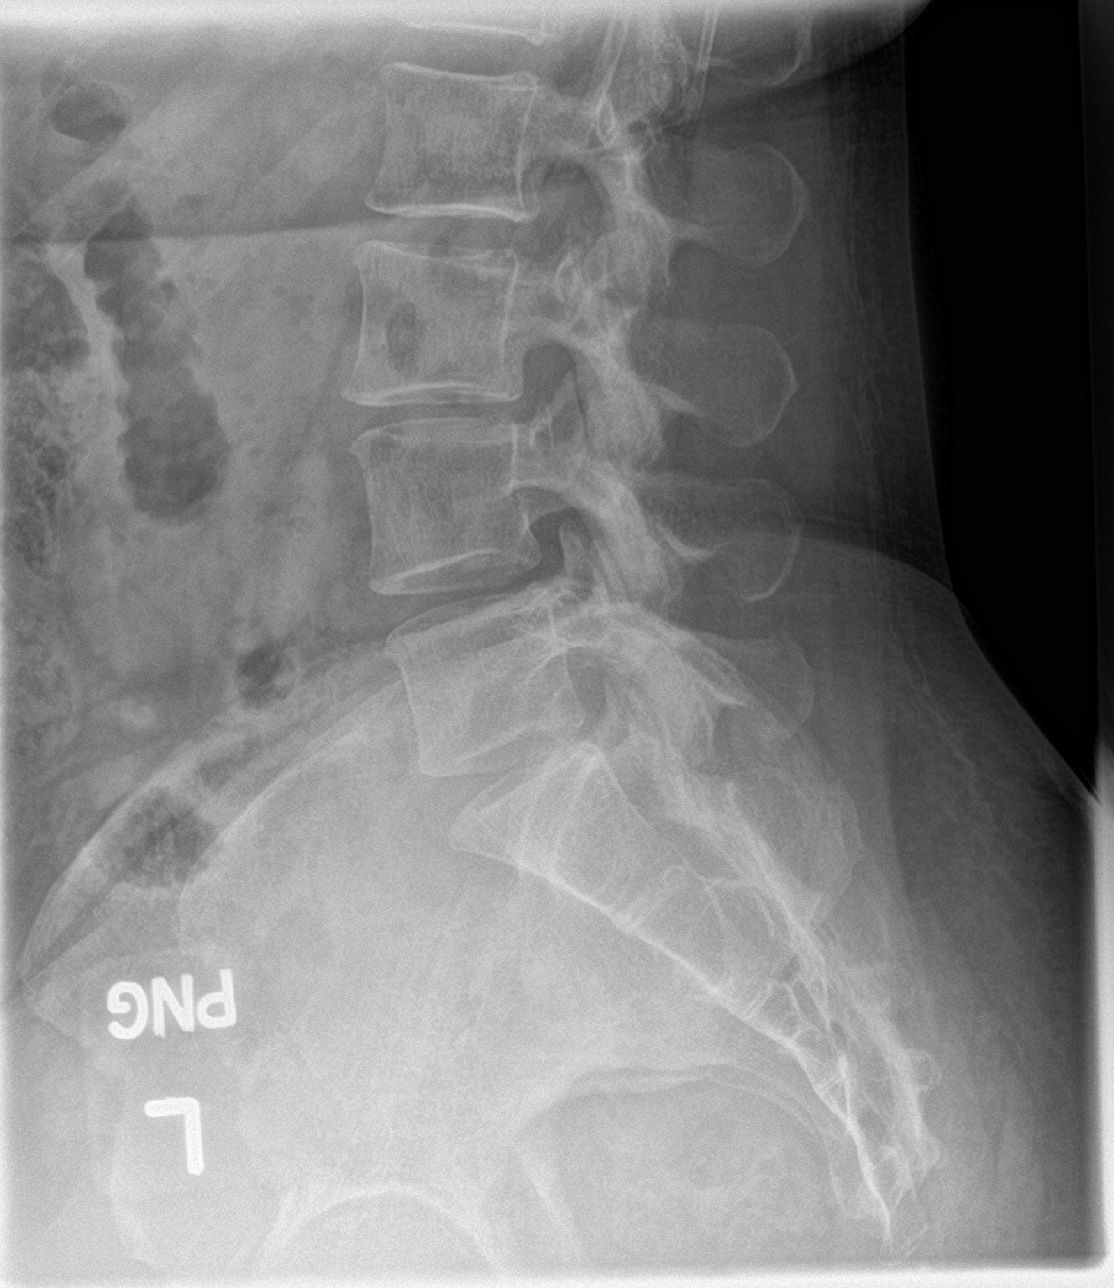

[5 of 5 positions shown; findings below may reference images not displayed]

FINDINGS: Frontal, lateral, spot lumbosacral lateral, and bilateral oblique
views were obtained. There are 5 non-rib-bearing lumbar type
vertebral bodies. There is no fracture or spondylolisthesis. There
is focal disc space narrowing at L3-4. Other disc spaces appear
unremarkable. There is facet osteoarthritic change at L4-5 and L5-S1
bilaterally.
IMPRESSION: Disc space narrowing at L3-4. Facet osteoarthritic change at L4-5
and L5-S1 bilaterally. No fracture or spondylolisthesis.

## 2019-11-09 ENCOUNTER — Ambulatory Visit: Payer: BC Managed Care – PPO | Admitting: Internal Medicine

## 2019-11-12 ENCOUNTER — Ambulatory Visit: Payer: BC Managed Care – PPO | Admitting: Internal Medicine

## 2019-11-24 ENCOUNTER — Ambulatory Visit: Payer: Self-pay | Admitting: Internal Medicine

## 2019-12-11 ENCOUNTER — Other Ambulatory Visit: Payer: Self-pay

## 2019-12-11 ENCOUNTER — Encounter (HOSPITAL_COMMUNITY): Payer: Self-pay

## 2019-12-11 ENCOUNTER — Emergency Department (HOSPITAL_COMMUNITY): Payer: Self-pay

## 2019-12-11 ENCOUNTER — Emergency Department (HOSPITAL_COMMUNITY)
Admission: EM | Admit: 2019-12-11 | Discharge: 2019-12-11 | Disposition: A | Discharge status: Home / Self Care | Payer: Self-pay | Attending: Emergency Medicine | Admitting: Emergency Medicine

## 2019-12-11 DIAGNOSIS — I10 Essential (primary) hypertension: Secondary | ICD-10-CM | POA: Insufficient documentation

## 2019-12-11 DIAGNOSIS — Z79899 Other long term (current) drug therapy: Secondary | ICD-10-CM | POA: Insufficient documentation

## 2019-12-11 DIAGNOSIS — R112 Nausea with vomiting, unspecified: Secondary | ICD-10-CM | POA: Insufficient documentation

## 2019-12-11 DIAGNOSIS — R072 Precordial pain: Secondary | ICD-10-CM | POA: Insufficient documentation

## 2019-12-11 DIAGNOSIS — Z91010 Allergy to peanuts: Secondary | ICD-10-CM | POA: Insufficient documentation

## 2019-12-11 DIAGNOSIS — Z87891 Personal history of nicotine dependence: Secondary | ICD-10-CM | POA: Insufficient documentation

## 2019-12-11 LAB — COMPREHENSIVE METABOLIC PANEL
ALT: 14 U/L (ref 0–44)
AST: 21 U/L (ref 15–41)
Albumin: 4 g/dL (ref 3.5–5.0)
Alkaline Phosphatase: 51 U/L (ref 38–126)
Anion gap: 8 (ref 5–15)
BUN: 17 mg/dL (ref 6–20)
CO2: 28 mmol/L (ref 22–32)
Calcium: 9 mg/dL (ref 8.9–10.3)
Chloride: 103 mmol/L (ref 98–111)
Creatinine, Ser: 1.06 mg/dL — ABNORMAL HIGH (ref 0.44–1.00)
GFR calc Af Amer: >60 mL/min (ref >60)
GFR calc non Af Amer: >60 mL/min (ref >60)
Glucose, Bld: 90 mg/dL (ref 70–99)
Potassium: 3.8 mmol/L (ref 3.5–5.1)
Sodium: 139 mmol/L (ref 135–145)
Total Bilirubin: 0.5 mg/dL (ref 0.3–1.2)
Total Protein: 7.6 g/dL (ref 6.5–8.1)

## 2019-12-11 LAB — CBC WITH DIFFERENTIAL/PLATELET
Abs Immature Granulocytes: 0.01 10*3/uL (ref 0.00–0.07)
Basophils Absolute: 0 10*3/uL (ref 0.0–0.1)
Basophils Relative: 1 %
Eosinophils Absolute: 0.5 10*3/uL (ref 0.0–0.5)
Eosinophils Relative: 10 %
HCT: 38.9 % (ref 36.0–46.0)
Hemoglobin: 12.4 g/dL (ref 12.0–15.0)
Immature Granulocytes: 0 %
Lymphocytes Relative: 38 %
Lymphs Abs: 2 10*3/uL (ref 0.7–4.0)
MCH: 27.4 pg (ref 26.0–34.0)
MCHC: 31.9 g/dL (ref 30.0–36.0)
MCV: 86.1 fL (ref 80.0–100.0)
Monocytes Absolute: 0.4 10*3/uL (ref 0.1–1.0)
Monocytes Relative: 8 %
Neutro Abs: 2.3 10*3/uL (ref 1.7–7.7)
Neutrophils Relative %: 43 %
Platelets: 328 10*3/uL (ref 150–400)
RBC: 4.52 MIL/uL (ref 3.87–5.11)
RDW: 15.5 % (ref 11.5–15.5)
WBC: 5.2 10*3/uL (ref 4.0–10.5)
nRBC: 0 % (ref 0.0–0.2)

## 2019-12-11 LAB — LIPASE, BLOOD: Lipase: 28 U/L (ref 11–51)

## 2019-12-11 LAB — TROPONIN I (HIGH SENSITIVITY)
Troponin I (High Sensitivity): 2 ng/L (ref <18)
Troponin I (High Sensitivity): 3 ng/L (ref <18)

## 2019-12-11 MED ORDER — ALUM & MAG HYDROXIDE-SIMETH 200-200-20 MG/5ML PO SUSP
30.0000 mL | Freq: Once | ORAL | Status: AC
  Administered 2019-12-11: 30 mL via ORAL
  Filled 2019-12-11: qty 30

## 2019-12-11 MED ORDER — IOHEXOL 350 MG/ML SOLN
100.0000 mL | Freq: Once | INTRAVENOUS | Status: AC | PRN
  Administered 2019-12-11: 100 mL via INTRAVENOUS

## 2019-12-11 MED ORDER — HYDROMORPHONE HCL 1 MG/ML IJ SOLN
0.5000 mg | Freq: Once | INTRAMUSCULAR | Status: AC
  Administered 2019-12-11: 0.5 mg via INTRAVENOUS
  Filled 2019-12-11: qty 1

## 2019-12-11 MED ORDER — ONDANSETRON HCL 4 MG/2ML IJ SOLN
4.0000 mg | Freq: Once | INTRAMUSCULAR | Status: AC
  Administered 2019-12-11: 4 mg via INTRAVENOUS
  Filled 2019-12-11: qty 2

## 2019-12-11 MED ORDER — PROMETHAZINE HCL 25 MG/ML IJ SOLN
12.5000 mg | Freq: Once | INTRAMUSCULAR | Status: AC
  Administered 2019-12-11: 12.5 mg via INTRAVENOUS
  Filled 2019-12-11: qty 1

## 2019-12-11 MED ORDER — PANTOPRAZOLE SODIUM 40 MG IV SOLR
40.0000 mg | Freq: Once | INTRAVENOUS | Status: AC
  Administered 2019-12-11: 40 mg via INTRAVENOUS
  Filled 2019-12-11: qty 40

## 2019-12-11 MED ORDER — LIDOCAINE VISCOUS HCL 2 % MT SOLN
15.0000 mL | Freq: Once | OROMUCOSAL | Status: AC
  Administered 2019-12-11: 15 mL via ORAL
  Filled 2019-12-11: qty 15

## 2019-12-11 MED ORDER — MORPHINE SULFATE (PF) 4 MG/ML IV SOLN
4.0000 mg | Freq: Once | INTRAVENOUS | Status: AC
  Administered 2019-12-11: 4 mg via INTRAVENOUS
  Filled 2019-12-11: qty 1

## 2019-12-11 MED ORDER — SODIUM CHLORIDE (PF) 0.9 % IJ SOLN
INTRAMUSCULAR | Status: AC
  Filled 2019-12-11: qty 50

## 2019-12-11 MED ORDER — SODIUM CHLORIDE 0.9 % IV BOLUS
500.0000 mL | Freq: Once | INTRAVENOUS | Status: AC
  Administered 2019-12-11: 500 mL via INTRAVENOUS

## 2019-12-11 MED ORDER — SODIUM CHLORIDE 0.9 % IV BOLUS
1000.0000 mL | Freq: Once | INTRAVENOUS | Status: AC
  Administered 2019-12-11: 1000 mL via INTRAVENOUS

## 2019-12-11 NOTE — ED Provider Notes (Signed)
Care handoff received from [facility] PA-C at shift change please see her note for full details of visit.  In short 54 year old female presents today for chest pain that began 18 hours prior to arrival.  Felt to be reproducible on examination.  Work-up including CBC, CMP, troponin, EKG, chest x-ray and CT dissection study is without acute findings.  Patient was given pain medication and is sleeping.  Plan of care at shift change is to wait for patient to wake up, p.o. challenge and then discharge. - Work-up:  CBC within normal limits High-sensitivity troponin within normal limits x2 CMP with creatinine 1.06 otherwise normal limits  EKG: Sinus rhythm LAE, consider biatrial enlargement Low voltage, precordial leads Abnormal R-wave progression, early transition Confirmed by Dene Gentry [phone]) on 12/11/2019 10:23:46 AM  CXR:  IMPRESSION:  1. No definite acute abnormality of the lungs.    2. There is new widening of the right mediastinum when compared to  radiographs dated 06/24/2019, concerning for mass, lymphadenopathy,  or paramedian airspace consolidation. Recommend PA and lateral  radiographs or CT to further evaluate.   CT Angio Dissection Study:  IMPRESSION:  1. Normal caliber of the mildly tortuous thoracic and abdominal  aorta without evidence of aneurysm, dissection, or other acute  aortic pathology. No significant aortic atherosclerosis.  2. Bandlike bibasilar atelectasis or scarring of the lungs.  3. No evidence of mass or lymphadenopathy in the chest. Abnormal  appearance of the right aspect of the mediastinum on same-day chest  radiographs is likely explained by rotation and low volume  technique.  4. No acute findings in the abdomen or pelvis.   Physical Exam  BP 101/74   Pulse 64   Temp 98.2 F (36.8 C) (Oral)   Resp (!) 23   Ht 5' (1.524 m)   Wt 67.1 kg   SpO2 91%   BMI 28.90 kg/m   Physical Exam Constitutional:      General: She is not in acute  distress.    Appearance: Normal appearance. She is well-developed. She is not ill-appearing or diaphoretic.  HENT:     Head: Normocephalic and atraumatic.     Right Ear: External ear normal.     Left Ear: External ear normal.     Nose: Nose normal.  Eyes:     General: Vision grossly intact. Gaze aligned appropriately.     Pupils: Pupils are equal, round, and reactive to light.  Neck:     Trachea: Trachea and phonation normal. No tracheal deviation.  Pulmonary:     Effort: Pulmonary effort is normal. No respiratory distress.  Chest:     Chest wall: Tenderness present. No deformity or crepitus.  Abdominal:     General: There is no distension.     Palpations: Abdomen is soft.     Tenderness: There is no abdominal tenderness. There is no guarding or rebound.  Musculoskeletal:        General: Normal range of motion.     Cervical back: Normal range of motion.  Skin:    General: Skin is warm and dry.  Neurological:     Mental Status: She is alert.     GCS: GCS eye subscore is 4. GCS verbal subscore is 5. GCS motor subscore is 6.     Comments: Speech is clear and goal oriented, follows commands Major Cranial nerves without deficit, no facial droop Moves extremities without ataxia, coordination intact  Psychiatric:        Behavior: Behavior normal.  ED Course/Procedures   Clinical Course as of Dec 11 1503  Sat Dec 11, 2019  1206 No STEMI  EKG 12-Lead [BH]  1206 Possible widening of right-sided mediastinum, concerning for infiltrates, mass, lymphadenopathy, recommend CT  DG Chest 2 View [BH]  1207 No leukocytosis, hemoglobin 12.4  CBC with Differential [BH]  1207 Troponin negative  Troponin I (High Sensitivity) [BH]  1207 No acute electrolyte, renal or liver abnormality  Comprehensive metabolic panel(!) [BH]  [id number] Tortuous vasculature  however no dilation, aneurysm or dissection.  Previously seen widened mediastinum likely due to patient position per Radiology. No  infiltrates.  CT Angio Chest/Abd/Pel for Dissection W and/or W/WO [BH]    Clinical Course User Index [BH] Henderly, Britni A, PA-C   Procedures  MDM  3:40 PM: Patient reassessed she is sitting up in bed holding an emesis bag, not actively vomiting.  Reports that she had been feeling better until she attempted to eat crackers when she had sudden onset nausea and nonbloody/nonbilious emesis.  She reports she feels this event feels similar to GERD but worse.  Plan of care at this time is to give patient Protonix, Phenergan and obtain a lipase. - 4:30 PM: Patient reevaluated, ambulating in hallway with RN.  Reports some improvement in her symptoms.  Still reports some nausea at this time.  Will reassess.  Lipase pending. - Lipase within normal limits  5:35 PM: Patient reassessment, she is resting comfortably in bed no acute distress.  States that she feels better and would like to go home.  She has tolerated Sprite without recurrence of nausea or vomiting.  She is ambulatory on the department without assistance or difficulty.  She has medications at home Protonix/Pepcid to help with GERD.  She has been informed of findings as above and has no further questions or concerns.  She will follow-up with her primary care provider for recheck this week.  Suspect patient's chest pain today musculoskeletal in etiology, agree with previous provider that it is reproducible on exam.  With 2 reassuring troponins.  Reassuring EKG doubt ACS, no evidence of dissection or PE on CT scan.  Doubt other acute cardiopulmonary etiology of her symptoms today.  Additionally abdomen soft nontender without peritoneal signs, reassuring LFTs and lipase.  Doubt perforation, pancreatitis, stone disease or other emergent intra-abdominal pathologies at this time.  Her nausea vomiting today she reported as similar to her history of acid reflux pain has improved with acid reflux medications.  There is no indication for further work-up  or treatment at this time.  At this time there does not appear to be any evidence of an acute emergency medical condition and the patient appears stable for discharge with appropriate outpatient follow up. Diagnosis was discussed with patient who verbalizes understanding of care plan and is agreeable to discharge. I have discussed return precautions with patient who verbalizes understanding of return precautions. Patient encouraged to follow-up with their PCP. All questions answered.  Patient's case discussed with Dr. Wilson Singer who agrees with plan to discharge with follow-up.   Note: Portions of this report may have been transcribed using voice recognition software. Every effort was made to ensure accuracy; however, inadvertent computerized transcription errors may still be present.   Gari Crown 12/11/19 1742    Virgel Manifold, MD 12/12/19 1257

## 2019-12-11 NOTE — ED Notes (Signed)
As I write this, she is in CT. I will medicate her for pain upon her return.

## 2019-12-11 NOTE — ED Provider Notes (Signed)
[facility] DEPT Provider Note   CSN: [id number] Arrival date & time: 12/11/19  1002     History Chest pain  [name] is a 54 y.o. female with past medical history significant for hypertension, heart murmur, tobacco abuse, stopped 2020 who presents for evaluation of chest pain.  Patient states at 6 PM yesterday she developed a sudden onset chest pain while she was using her Symbicort.  Patient states she felt a sharp pain go from the center of her chest into her back.  Patient states the sharp pain has resolved however she continues to have a dull aching to her anterior chest.  She describes her current pain as aching and throbbing.  Patient states she feels like the inferior aspect of her left arm has pins-and-needles.  Denies headache, blurred vision, neck pain, neck stiffness, diaphoresis, nausea, vomiting, shortness of breath, abdominal pain, diarrhea, dysuria.  Has not take anything for symptoms.  Patient states she had a similar pain 2 years ago had a negative work-up at that time.  She was seen by cardiology who thought her pain was likely musculoskeletal.  She did have outpatient CT Calcium score which showed a calcium score of 0, low risk for ACS.  No fevers, chills.  No recent Covid exposures.  Pain is not pleuritic or exertional in nature.  History obtained from patient and past medical records.  No interpreter is used.  HPI     Past Medical History:  Diagnosis Date  . 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   . Allergy   . Anemia   . Asthma   . GERD (gastroesophageal reflux disease)   . Heart murmur   . Hypertension   . Positive TB test   . Shingles   . TOBACCO USE 03/22/2009   Qualifier: Diagnosis of  By: Regis Bill MD, [name]  Stopped Dec 12 2010      Patient Active Problem List   Diagnosis Date Noted  . Right hip pain 08/13/2019  . Greater trochanteric bursitis of both hips 07/01/2019  . Headache 07/02/2013  .  Cough variant asthma 09/29/2009  . Essential hypertension 05/30/2009  . GERD 05/12/2008  . HOT FLASHES 05/12/2008  . 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 ALLERGIC RHINITIS 08/07/2007    Past Surgical History:  Procedure Laterality Date  . ABDOMINAL HYSTERECTOMY    . BREAST SURGERY     cyst from left breast  . BUNIONECTOMY     both feet  . CHOLECYSTECTOMY    . CYSTECTOMY     left shoulder  . KNEE SURGERY Left   . PANENDOSCOPY    . TUBAL LIGATION    . uterine ablastion     and polyps removed  . Uterine polyps       OB History   No obstetric history on file.     Family History  Problem Relation Age of Onset  . Hypertension Mother   . Alzheimer's disease Mother   . Hypertension Father   . Prostate cancer Father   . HIV Brother   . Lupus Sister   . Colon cancer Paternal Grandmother   . Esophageal cancer Neg Hx   . Liver cancer Neg Hx   . Pancreatic cancer Neg Hx   . Rectal cancer Neg Hx   . Stomach cancer Neg Hx     Social History   Tobacco Use  . Smoking status: Former Smoker    Packs/day: 0.50  Years: 17.00    Pack years: 8.50    Types: Cigarettes    Quit date: 12/12/2010    Years since quitting: 9.0  . Smokeless tobacco: Never Used  Substance Use Topics  . Alcohol use: No  . Drug use: No    Home Medications Prior to Admission medications   Medication Sig Start Date End Date Taking? Authorizing Provider  albuterol (PROVENTIL) (2.5 MG/3ML) 0.083% nebulizer solution Take 3 mLs (2.5 mg total) by nebulization every 6 (six) hours as needed for wheezing or shortness of breath. 04/27/19  Yes [name], [name], PA-C  albuterol (VENTOLIN HFA) 108 (90 Base) MCG/ACT inhaler INHALE TWO PUFFS BY MOUTH EVERY 4 HOURS AS NEEDED ONLY IF YOU CANT CATCH YOUR BREATH Patient taking differently: Inhale 2 puffs into the lungs every 4 (four) hours as needed for wheezing or shortness of breath.  03/19/19  Yes [name], MD  budesonide-formoterol (SYMBICORT) 160-4.5 MCG/ACT inhaler  Inhale 2 puffs into the lungs every 12 (twelve) hours. 09/08/19  Yes [name], MD  EPINEPHRINE 0.3 mg/0.3 mL IJ SOAJ injection INJECT 1 SYRINGE INTO THE MUSCLE AS NEEDED FOR ANAPHYLAXIS Patient taking differently: Inject 0.3 mg into the muscle as needed for anaphylaxis.  08/19/19  Yes [name], [name], MD  famotidine (PEPCID) 20 MG tablet One at bedtime Patient taking differently: Take 20 mg by mouth at bedtime. One at bedtime  11/11/18  Yes [name], MD  fluticasone [facility]) 50 MCG/ACT nasal spray Place 2 sprays into both nostrils daily.   Yes [provider]  hydrochlorothiazide (HYDRODIURIL) 25 MG tablet TAKE ONE TABLET BY MOUTH ONCE DAILY 06/17/19  Yes [name], [name], MD  ibuprofen (ADVIL) 200 MG tablet Take 400 mg by mouth every 6 (six) hours as needed for moderate pain.   Yes [provider]  Multiple Vitamins-Minerals (ZINC PO) Take 1 tablet by mouth daily.   Yes [provider]  pantoprazole (PROTONIX) 40 MG tablet Take 1 tablet (40 mg total) by mouth 2 (two) times daily before a meal. Take 30-60 min before first meal of the day 06/14/19  Yes [name], MD  Spacer/Aero-Holding Dorise Bullion Use with [facility] inhaler albuterol for asthma. 02/19/19  Yes [name], [name], MD  valsartan (DIOVAN) 320 MG tablet TAKE 1/2 TABLET BY MOUTH DAILY Patient taking differently: Take 160 mg by mouth daily.  05/17/19  Yes [name], [name], MD  VITAMIN D PO Take 1 capsule by mouth daily.   Yes [provider]  budesonide-formoterol (SYMBICORT) 80-4.5 MCG/ACT inhaler INHALE 2 PUFFS BY MOUTH INTO THE LUNGS TWICE DAILY Patient not taking: Reported on 12/11/2019 08/23/19   [name], MD  gabapentin (NEURONTIN) 100 MG capsule Take 2 capsules (200 mg total) by mouth at bedtime. Patient not taking: Reported on 12/11/2019 07/01/19   Lyndal Pulley, DO    Allergies    Diovan hct [valsartan-hydrochlorothiazide]; Purified water [water, sterile]; Allegra [fexofenadine];  Chocolate; Cimetidine; Citric acid; Coly-mycin s; Peanut-containing drug products; and Amlodipine  Review of Systems   Review of Systems  Constitutional: Negative.   HENT: Negative.   Respiratory: Negative.   Cardiovascular: Positive for chest pain. Negative for palpitations and leg swelling.  Gastrointestinal: Negative.   Genitourinary: Negative.   Musculoskeletal: Negative.   Skin: Negative.   Neurological: Positive for numbness (Pins and needle to left inferior arm). Negative for dizziness, tremors, seizures, syncope, facial asymmetry, speech difficulty, weakness, light-headedness and headaches.  All other systems reviewed and are  negative.   Physical Exam Updated Vital Signs BP 101/74   Pulse 64   Temp 98.2 F (36.8 C) (Oral)   Resp (!) 23   Ht 5' (1.524 m)   Wt 67.1 kg   SpO2 91%   BMI 28.90 kg/m   Physical Exam Vitals and nursing note reviewed.  Constitutional:      General: She is not in acute distress.    Appearance: She is not ill-appearing, toxic-appearing or diaphoretic.  HENT:     Head: Normocephalic and atraumatic.     Jaw: There is normal jaw occlusion.     Nose: Nose normal.     Mouth/Throat:     Mouth: Mucous membranes are moist.     Pharynx: Oropharynx is clear.  Neck:     Vascular: No carotid bruit or JVD.     Trachea: Trachea and phonation normal.  Cardiovascular:     Rate and Rhythm: Normal rate.     Pulses: Normal pulses.          Radial pulses are 2+ on the right side and 2+ on the left side.       Dorsalis pedis pulses are 2+ on the right side and 2+ on the left side.       Posterior tibial pulses are 2+ on the right side and 2+ on the left side.     Heart sounds: Normal heart sounds.  Pulmonary:     Effort: Pulmonary effort is normal.     Breath sounds: Normal breath sounds and air entry.     Comments: Clear to auscultation bilateral without wheeze, rhonchi or rales.  Speaks in full sentences without difficulty. Chest:     Chest wall:  Tenderness present. No mass, lacerations, deformity, swelling, crepitus or edema.     Comments: Diffuse tenderness palpation to bilateral arms, cross chest wall.  No crepitus or step-offs. Abdominal:     General: Bowel sounds are normal.     Palpations: Abdomen is soft.     Tenderness: There is no abdominal tenderness. There is no right CVA tenderness, left CVA tenderness, guarding or rebound.     Hernia: No hernia is present.  Musculoskeletal:     Cervical back: Normal, full passive range of motion without pain, normal range of motion and neck supple.     Thoracic back: Normal.     Lumbar back: Normal.     Comments: Moves all 4 extremities without difficulty. No midline back pain.  Skin:    Capillary Refill: Capillary refill takes less than 2 seconds.     Comments: Brisk cap refill. No overlying skin changes to chest wall  Neurological:     General: No focal deficit present.     Mental Status: She is alert.     Cranial Nerves: Cranial nerves are intact.     Sensory: Sensation is intact.     Motor: Motor function is intact.     Coordination: Coordination is intact.     Gait: Gait is intact.     Comments: CN2-12 grossly intact. Ambulatory in room without difficulty.     ED Results / Procedures / Treatments   Labs (all labs ordered are listed, but only abnormal results are displayed) Labs Reviewed  COMPREHENSIVE METABOLIC PANEL - Abnormal; Notable for the following components:      Result Value   Creatinine, Ser 1.06 (*)    All other components within normal limits  CBC WITH DIFFERENTIAL/PLATELET  TROPONIN I (HIGH SENSITIVITY)  TROPONIN  I (HIGH SENSITIVITY)    EKG EKG Interpretation  Date/Time:  Saturday December 11 2019 10:10:21 EST Ventricular Rate:  66 PR Interval:    QRS Duration: 83 QT Interval:  419 QTC Calculation: 439 R Axis:   28 Text Interpretation: Sinus rhythm LAE, consider biatrial enlargement Low voltage, precordial leads Abnormal R-wave progression,  early transition Confirmed by Dene Gentry [id number]) on 12/11/2019 10:23:46 AM   Radiology DG Chest 2 View  Result Date: 12/11/2019 CLINICAL DATA:  Left-sided chest pain EXAM: CHEST - 2 VIEW COMPARISON:  06/24/2019 FINDINGS: There is new widening of the right mediastinum. Unchanged bandlike scarring or atelectasis of the left midlung. The visualized skeletal structures are unremarkable. IMPRESSION: 1.  No definite acute abnormality of the lungs. 2. There is new widening of the right mediastinum when compared to radiographs dated 06/24/2019, concerning for mass, lymphadenopathy, or paramedian airspace consolidation. Recommend PA and lateral radiographs or CT to further evaluate. Electronically Signed   By: Eddie Candle M.D.   On: 12/11/2019 11:50   CT Angio Chest/Abd/Pel for Dissection W and/or W/WO  Result Date: 12/11/2019 CLINICAL DATA:  Chest pain, abnormal chest radiograph EXAM: CT ANGIOGRAPHY CHEST, ABDOMEN AND PELVIS TECHNIQUE: Multidetector CT imaging through the chest, abdomen and pelvis was performed using the standard protocol during bolus administration of intravenous contrast. Multiplanar reconstructed images and MIPs were obtained and reviewed to evaluate the vascular anatomy. CONTRAST:  164mL OMNIPAQUE IOHEXOL 350 MG/ML SOLN COMPARISON:  Same day chest radiograph FINDINGS: CTA CHEST FINDINGS Cardiovascular: Preferential opacification of the thoracic aorta. Normal contour and caliber of the thoracic aorta without evidence of aneurysm, dissection, or other acute aortic pathology. No significant atherosclerosis. Normal heart size. No pericardial effusion. Mediastinum/Nodes: No enlarged mediastinal, hilar, or axillary lymph nodes. Thyroid gland, trachea, and esophagus demonstrate no significant findings. Lungs/Pleura: Bandlike bibasilar atelectasis or scarring. No pleural effusion or pneumothorax. Musculoskeletal: No chest wall abnormality. No acute or significant osseous findings. Review of the MIP  images confirms the above findings. CTA ABDOMEN AND PELVIS FINDINGS VASCULAR Mildly tortuous abdominal aorta, which is normal in caliber without evidence of aneurysm, dissection, or other acute aortic pathology. Standard branching pattern of the abdominal aorta with solitary bilateral renal arteries. No significant atherosclerosis. Review of the MIP images confirms the above findings. NON-VASCULAR Hepatobiliary: No focal liver abnormality is seen. Status post cholecystectomy. Postoperative biliary dilatation. Pancreas: Unremarkable. No pancreatic ductal dilatation or surrounding inflammatory changes. Spleen: Normal in size without significant abnormality. Adrenals/Urinary Tract: Adrenal glands are unremarkable. Kidneys are normal, without renal calculi, solid lesion, or hydronephrosis. Bladder is unremarkable. Stomach/Bowel: Stomach is within normal limits. Appendix appears normal. No evidence of bowel wall thickening, distention, or inflammatory changes. Lymphatic: No enlarged abdominal or pelvic lymph nodes. Reproductive: No mass or other significant abnormality. Other: No abdominal wall hernia or abnormality. No abdominopelvic ascites. Musculoskeletal: No acute or significant osseous findings. Review of the MIP images confirms the above findings. IMPRESSION: 1. Normal caliber of the mildly tortuous thoracic and abdominal aorta without evidence of aneurysm, dissection, or other acute aortic pathology. No significant aortic atherosclerosis. 2. Bandlike bibasilar atelectasis or scarring of the lungs. 3. No evidence of mass or lymphadenopathy in the chest. Abnormal appearance of the right aspect of the mediastinum on same-day chest radiographs is likely explained by rotation and low volume technique. 4. No acute findings in the abdomen or pelvis. Electronically Signed   By: Eddie Candle M.D.   On: 12/11/2019 13:01    Procedures Procedures (including critical care time)  Medications Ordered in ED Medications    sodium chloride (PF) 0.9 % injection (has no administration in time range)  sodium chloride 0.9 % bolus 1,000 mL (0 mLs Intravenous Stopped 12/11/19 1206)  morphine 4 MG/ML injection 4 mg (4 mg Intravenous Given 12/11/19 1109)  HYDROmorphone (DILAUDID) injection 0.5 mg (0.5 mg Intravenous Given 12/11/19 1250)  iohexol (OMNIPAQUE) 350 MG/ML injection 100 mL (100 mLs Intravenous Contrast Given 12/11/19 1229)  ondansetron (ZOFRAN) injection 4 mg (4 mg Intravenous Given 12/11/19 1456)   ED Course  I have reviewed the triage vital signs and the nursing notes.  Pertinent labs & imaging results that were available during my care of the patient were reviewed by me and considered in my medical decision making (see chart for details).  54 year old female appears otherwise well presents for evaluation of chest pain which began 18 hours PTA.  Pain initially sharp however is now a dull aching.  No associated diaphoresis, nausea or vomiting.  Patient with diffuse tenderness across her chest and into her bilateral arms.  She is neurovascularly intact.  She has 2+ radial pulses bilaterally.  Abdomen soft, nontender.  No pulsatile abdominal mass.   Clinical Course as of Dec 10 1504  Sat Dec 11, 2019  1206 No STEMI  EKG 12-Lead [BH]  1206 Possible widening of right-sided mediastinum, concerning for infiltrates, mass, lymphadenopathy, recommend CT  DG Chest 2 View [BH]  1207 No leukocytosis, hemoglobin 12.4  CBC with Differential [BH]  1207 Troponin negative  Troponin I (High Sensitivity) [BH]  1207 No acute electrolyte, renal or liver abnormality  Comprehensive metabolic panel(!) [BH]  [id number] Tortuous vasculature  however no dilation, aneurysm or dissection.  Previously seen widened mediastinum likely due to patient position per Radiology. No infiltrates.  CT Angio Chest/Abd/Pel for Dissection W and/or W/WO [BH]    Clinical Course User Index [BH] Acquanetta Cabanilla A, PA-C   1340: Went to reassess patient.  No  pain.  She is drowsy from the pain medicine.  Heart score 3.  Negative delta troponins, EKG prior to previous.  Reassuring CT calcium score from 1 year ago.  Given I am able to reproduce her pain feel likely musculoskeletal in nature.  Will reassess patient when she is a little more awake.  1400: Patient a little more awake however still sleepy. Requesting PO. Will PO challenge. Some mild low oxygen 91-21% on RA however sleepy.  1445: Nursing has made me aware patient with emesis after PO challenge. Will order Zofran.  Care transferred to [facility] who will reevaluate patient. Disposition per oncoming provider. Low suspicion for ACS, PE, dissection, bacteria infectious process at this time given reassuring labs and imaging.   MDM Rules/Calculators/A&P                      [name] was evaluated in Emergency Department on 12/11/2019 for the symptoms described in the history of present illness. She was evaluated in the context of the global COVID-19 pandemic, which necessitated consideration that the patient might be at risk for infection with the SARS-CoV-2 virus that causes COVID-19. Institutional protocols and algorithms that pertain to the evaluation of patients at risk for COVID-19 are in a state of rapid change based on information released by regulatory bodies including the CDC and federal and state organizations. These policies and algorithms were followed during the patient's care in the ED. Final Clinical Impression(s) / ED Diagnoses Final diagnoses:  Precordial pain    Rx /  DC Orders ED Discharge Orders    None       Theo Krumholz A, PA-C 12/11/19 1506    Valarie Merino, MD 12/12/19 [phone]

## 2019-12-11 NOTE — ED Triage Notes (Signed)
Patient states when she took her symbicort yesterday at 1800 she felt a sharp pain through her chest. Inside of left arm has "pins and needles feeling" and left underarm hurts. Pain 7/10, described as throbbing chest pain, and sore underarm feeling.

## 2019-12-11 NOTE — ED Notes (Addendum)
Patient ambulated to the restroom without assistance 

## 2019-12-11 NOTE — Discharge Instructions (Signed)
You have been diagnosed today with Chest Pain, Nausea and Vomiting.  At this time there does not appear to be the presence of an emergent medical condition, however there is always the potential for conditions to change. Please read and follow the below instructions.  Please return to the Emergency Department immediately for any new or worsening symptoms. Please be sure to follow up with your Primary Care Provider within one week regarding your visit today; please call their office to schedule an appointment even if you are feeling better for a follow-up visit. Please drink plenty of water and get plenty of rest. Your CT scan had some incidental findings today including bandlike bibasilar atelectasis or scarring of the lungs and a mildly tortuous aorta.  Please discuss these incidental findings with your primary care provider at your follow-up visit this week.  Get help right away if: You have pain in your chest, neck, arm, or jaw. You feel very weak or you pass out (faint). You throw up again and again. You have throw up that is bright red or looks like black coffee grounds. You have bloody or black poop (stools) or poop that looks like tar. You have a very bad headache, a stiff neck, or both. You have very bad pain, cramping, or bloating in your belly (abdomen). You have trouble breathing. You are breathing very quickly. Your heart is beating very quickly. Your skin feels cold and clammy. You feel confused. You have signs of losing too much water in your body, such as: Dark pee, very little pee, or no pee. Cracked lips. Dry mouth. Sunken eyes. Sleepiness. Weakness.  Get help right away if: Your chest pain is worse. You have a cough that gets worse, or you cough up blood. You have very bad (severe) pain in your belly (abdomen). You pass out (faint). You have either of these for no clear reason: Sudden chest discomfort. Sudden discomfort in your arms, back, neck, or jaw. You  have shortness of breath at any time. You suddenly start to sweat, or your skin gets clammy. You feel sick to your stomach (nauseous). You throw up (vomit). You suddenly feel lightheaded or dizzy. You feel very weak or tired. Your heart starts to beat fast, or it feels like it is skipping beats. You have any new/concerning or worsening of symptoms  Please read the additional information packets attached to your discharge summary.  Do not take your medicine if  develop an itchy rash, swelling in your mouth or lips, or difficulty breathing; call 911 and seek immediate emergency medical attention if this occurs.  Note: Portions of this text may have been transcribed using voice recognition software. Every effort was made to ensure accuracy; however, inadvertent computerized transcription errors may still be present.

## 2019-12-11 NOTE — ED Notes (Signed)
I gave her some ginger ale and a couple of crackers per instruction of our P.A. Britni. This made her immediately nauseated and she vomited a small amt.

## 2019-12-11 NOTE — ED Notes (Signed)
Pt ambulatory to and from bathroom with steady gait. Pt provided with sprite.

## 2019-12-11 NOTE — ED Notes (Signed)
Pt is still continuing to dry heave after Zofran. Erlene Quan, [place] notified.

## 2019-12-22 ENCOUNTER — Ambulatory Visit: Payer: Self-pay | Admitting: Internal Medicine

## 2019-12-22 ENCOUNTER — Encounter: Payer: Self-pay | Admitting: Primary Care

## 2019-12-22 ENCOUNTER — Telehealth (INDEPENDENT_AMBULATORY_CARE_PROVIDER_SITE_OTHER): Payer: Self-pay | Admitting: Primary Care

## 2019-12-22 DIAGNOSIS — J45991 Cough variant asthma: Secondary | ICD-10-CM

## 2019-12-22 MED ORDER — FLUTICASONE FUROATE-VILANTEROL 100-25 MCG/INH IN AEPB: 1.0000 | INHALATION_SPRAY | Freq: Every day | RESPIRATORY_TRACT | Status: DC

## 2019-12-22 NOTE — Progress Notes (Signed)
Virtual Visit via Video Note  I connected with [name] on 12/22/19 at  1:30 PM EST by a video enabled telemedicine application and verified that I am speaking with the correct person using two identifiers.  Location: Patient: Home  Provider: In car, not driving    I discussed the limitations of evaluation and management by telemedicine and the availability of in person appointments. The patient expressed understanding and agreed to proceed.  History of Present Illness: 54 year old female, former smoker quit 2012. PMH significant for cough variant asthma, allergic rhinitis, GERD, HTN. Patient of Dr. Melvyn Novas, last seen on 09/08/19. Maintained on Symbicort 80, prn albuterol hfa/neb, Flonase and Pepcid/Protonix.   12/22/2019 Patient contacted today for 6 month follow-up/video visit.  Her asthma symptoms have been well controlled on Symbicort 80. She has had no recent exacerbations since last office visit. She has not needed to use her Albuterol in several months.  Her Symbicort prescription cost her >300 dollars. She was getting medication from [employer], however, they will not provide medication until they get a denial letter from [facility] which could take upwards of 90 days. Denies shortness of breath, wheezing, chest tightness of cough.  Observations/Objective:  - Able to speak in full sentence - No shortness of breath, wheezing or cough  Pulmonary testing reviewed: PFT's wnl 02/20/11   FENO 02/12/2018  =   61 on symb 80 2bid with poor hfa Spirometry 02/12/2018  Nl   Allergy profile 02/12/18  >  Eos 0.4 /  IgE  50  RAST Pos grass > dog  FENO 05/26/2018  =   25 on symb 80 2bid   Assessment and Plan:  Cough variant Asthma: - Symbicort prescription cost her >300 dollars. Awaiting approval from [employer] - Provided patient samples with Breo 100; once Symbicort is approved resume Symbicort 80 two puffs twice daily  - Use albuterol rescue inhaler or nebulizer every 6 hours for  breakthrough shortness of breath/wheezing   Allergic rhinitis: - Continue Flonase nasal spray once daily  GERD: - Continue Pepcid and Protonix daily as prescribed   Follow Up Instructions:  - 6 months with Dr. Melvyn Novas or sooner if needed   I discussed the assessment and treatment plan with the patient. The patient was provided an opportunity to ask questions and all were answered. The patient agreed with the plan and demonstrated an understanding of the instructions.   The patient was advised to call back or seek an in-person evaluation if the symptoms worsen or if the condition fails to improve as anticipated.  I provided 18 minutes of non-face-to-face time during this encounter.   Martyn Ehrich, NP

## 2019-12-22 NOTE — Patient Instructions (Addendum)
Cough variant asthma: - Until Symbicort 80 is approved by [employer], use samples of Breo 100 (take one puff once daily- rinse mouth after use) These are both ICS/LABA inhalers used for treatment of asthma  - Use albuterol rescue inhaler or nebulizer every 6 hours for breakthrough shortness of breath/wheezing   Allergic rhinitis: - Continue Flonase nasal spray once daily  GERD: - Continue Pepcid and Protonix daily as prescribed  - See GERD diet handout   Follow-up: - 6 months with Dr. Melvyn Novas or sooner if needed    Asthma, Adult  Asthma is a long-term (chronic) condition that causes recurrent episodes in which the airways become tight and narrow. The airways are the passages that lead from the nose and mouth down into the lungs. Asthma episodes, also called asthma attacks, can cause coughing, wheezing, shortness of breath, and chest pain. The airways can also fill with mucus. During an attack, it can be difficult to breathe. Asthma attacks can range from minor to life threatening. Asthma cannot be cured, but medicines and lifestyle changes can help control it and treat acute attacks. What are the causes? This condition is believed to be caused by inherited (genetic) and environmental factors, but its exact cause is not known. There are many things that can bring on an asthma attack or make asthma symptoms worse (triggers). Asthma triggers are different for each person. Common triggers include:  Mold.  Dust.  Cigarette smoke.  Cockroaches.  Things that can cause allergy symptoms (allergens), such as animal dander or pollen from trees or grass.  Air pollutants such as household cleaners, wood smoke, smog, or Advertising account planner.  Cold air, weather changes, and winds (which increase molds and pollen in the air).  Strong emotional expressions such as crying or laughing hard.  Stress.  Certain medicines (such as aspirin) or types of medicines (such as beta-blockers).  Sulfites in  foods and drinks. Foods and drinks that may contain sulfites include dried fruit, potato chips, and sparkling grape juice.  Infections or inflammatory conditions such as the flu, a cold, or inflammation of the nasal membranes (rhinitis).  Gastroesophageal reflux disease (GERD).  Exercise or strenuous activity. What are the signs or symptoms? Symptoms of this condition may occur right after asthma is triggered or many hours later. Symptoms include:  Wheezing. This can sound like whistling when you breathe.  Excessive nighttime or early morning coughing.  Frequent or severe coughing with a common cold.  Chest tightness.  Shortness of breath.  Tiredness (fatigue) with minimal activity. How is this diagnosed? This condition is diagnosed based on:  Your medical history.  A physical exam.  Tests, which may include: ? Lung function studies and pulmonary studies (spirometry). These tests can evaluate the flow of air in your lungs. ? Allergy tests. ? Imaging tests, such as X-rays. How is this treated? There is no cure for this condition, but treatment can help control your symptoms. Treatment for asthma usually involves:  Identifying and avoiding your asthma triggers.  Using medicines to control your symptoms. Generally, two types of medicines are used to treat asthma: ? Controller medicines. These help prevent asthma symptoms from occurring. They are usually taken every day. ? Fast-acting reliever or rescue medicines. These quickly relieve asthma symptoms by widening the narrow and tight airways. They are used as needed and provide short-term relief.  Using supplemental oxygen. This may be needed during a severe episode.  Using other medicines, such as: ? Allergy medicines, such as antihistamines, if  your asthma attacks are triggered by allergens. ? Immune medicines (immunomodulators). These are medicines that help control the immune system.  Creating an asthma action plan. An  asthma action plan is a written plan for managing and treating your asthma attacks. This plan includes: ? A list of your asthma triggers and how to avoid them. ? Information about when medicines should be taken and when their dosage should be changed. ? Instructions about using a device called a peak flow meter. A peak flow meter measures how well the lungs are working and the severity of your asthma. It helps you monitor your condition. Follow these instructions at home: Controlling your home environment Control your home environment in the following ways to help avoid triggers and prevent asthma attacks:  Change your heating and air conditioning filter regularly.  Limit your use of fireplaces and wood stoves.  Get rid of pests (such as roaches and mice) and their droppings.  Throw away plants if you see mold on them.  Clean floors and dust surfaces regularly. Use unscented cleaning products.  Try to have someone else vacuum for you regularly. Stay out of rooms while they are being vacuumed and for a short while afterward. If you vacuum, use a dust mask from a hardware store, a double-layered or microfilter vacuum cleaner bag, or a vacuum cleaner with a HEPA filter.  Replace carpet with wood, tile, or vinyl flooring. Carpet can trap dander and dust.  Use allergy-proof pillows, mattress covers, and box spring covers.  Keep your bedroom a trigger-free room.  Avoid pets and keep windows closed when allergens are in the air.  Wash beddings every week in hot water and dry them in a dryer.  Use blankets that are made of polyester or cotton.  Clean bathrooms and kitchens with bleach. If possible, have someone repaint the walls in these rooms with mold-resistant paint. Stay out of the rooms that are being cleaned and painted.  Wash your hands often with soap and water. If soap and water are not available, use hand sanitizer.  Do not allow anyone to smoke in your home. General  instructions  Take over-the-counter and prescription medicines only as told by your health care provider. ? Speak with your health care provider if you have questions about how or when to take the medicines. ? Make note if you are requiring more frequent dosages.  Do not use any products that contain nicotine or tobacco, such as cigarettes and e-cigarettes. If you need help quitting, ask your health care provider. Also, avoid being exposed to secondhand smoke.  Use a peak flow meter as told by your health care provider. Record and keep track of the readings.  Understand and use the asthma action plan to help minimize, or stop an asthma attack, without needing to seek medical care.  Make sure you stay up to date on your yearly vaccinations as told by your health care provider. This may include vaccines for the flu and pneumonia.  Avoid outdoor activities when allergen counts are high and when air quality is low.  Wear a ski mask that covers your nose and mouth during outdoor winter activities. Exercise indoors on cold days if you can.  Warm up before exercising, and take time for a cool-down period after exercise.  Keep all follow-up visits as told by your health care provider. This is important. Where to find more information  For information about asthma, turn to the Centers for Disease Control and Prevention at http://www.clark.net/.htm  For air quality information, turn to [employer] at WeightRating.nl Contact a health care provider if:  You have wheezing, shortness of breath, or a cough even while you are taking medicine to prevent attacks.  The mucus you cough up (sputum) is thicker than usual.  Your sputum changes from clear or white to yellow, green, gray, or bloody.  Your medicines are causing side effects, such as a rash, itching, swelling, or trouble breathing.  You need to use a reliever medicine more than 2-3 times a week.  Your peak flow reading is still at 50-79%  of your personal best after following your action plan for 1 hour.  You have a fever. Get help right away if:  You are getting worse and do not respond to treatment during an asthma attack.  You are short of breath when at rest or when doing very little physical activity.  You have difficulty eating, drinking, or talking.  You have chest pain or tightness.  You develop a fast heartbeat or palpitations.  You have a bluish color to your lips or fingernails.  You are light-headed or dizzy, or you faint.  Your peak flow reading is less than 50% of your personal best.  You feel too tired to breathe normally. Summary  Asthma is a long-term (chronic) condition that causes recurrent episodes in which the airways become tight and narrow. These episodes can cause coughing, wheezing, shortness of breath, and chest pain.  Asthma cannot be cured, but medicines and lifestyle changes can help control it and treat acute attacks.  Make sure you understand how to avoid triggers and how and when to use your medicines.  Asthma attacks can range from minor to life threatening. Get help right away if you have an asthma attack and do not respond to treatment with your usual rescue medicines. This information is not intended to replace advice given to you by your health care provider. Make sure you discuss any questions you have with your health care provider. Document Revised: 12/24/2018 Document Reviewed: 11/25/2016 Elsevier Patient Education  2020 [place] for Gastroesophageal Reflux Disease, Adult When you have gastroesophageal reflux disease (GERD), the foods you eat and your eating habits are very important. Choosing the right foods can help ease your discomfort. Think about working with a nutrition specialist (dietitian) to help you make good choices. What are tips for following this plan?  Meals  Choose healthy foods that are low in fat, such as fruits, vegetables,  whole grains, low-fat dairy products, and lean meat, fish, and poultry.  Eat small meals often instead of 3 large meals a day. Eat your meals slowly, and in a place where you are relaxed. Avoid bending over or lying down until 2-3 hours after eating.  Avoid eating meals 2-3 hours before bed.  Avoid drinking a lot of liquid with meals.  Cook foods using methods other than frying. Bake, grill, or broil food instead.  Avoid or limit: ? Chocolate. ? Peppermint or spearmint. ? Alcohol. ? Pepper. ? Black and decaffeinated coffee. ? Black and decaffeinated tea. ? Bubbly (carbonated) soft drinks. ? Caffeinated energy drinks and soft drinks.  Limit high-fat foods such as: ? Fatty meat or fried foods. ? Whole milk, cream, butter, or ice cream. ? Nuts and nut butters. ? Pastries, donuts, and sweets made with butter or shortening.  Avoid foods that cause symptoms. These foods may be different for everyone. Common foods that cause symptoms include: ? Tomatoes. ?  Oranges, lemons, and limes. ? Peppers. ? Spicy food. ? Onions and garlic. ? Vinegar. Lifestyle  Maintain a healthy weight. Ask your doctor what weight is healthy for you. If you need to lose weight, work with your doctor to do so safely.  Exercise for at least 30 minutes for 5 or more days each week, or as told by your doctor.  Wear loose-fitting clothes.  Do not smoke. If you need help quitting, ask your doctor.  Sleep with the head of your bed higher than your feet. Use a wedge under the mattress or blocks under the bed frame to raise the head of the bed. Summary  When you have gastroesophageal reflux disease (GERD), food and lifestyle choices are very important in easing your symptoms.  Eat small meals often instead of 3 large meals a day. Eat your meals slowly, and in a place where you are relaxed.  Limit high-fat foods such as fatty meat or fried foods.  Avoid bending over or lying down until 2-3 hours after  eating.  Avoid peppermint and spearmint, caffeine, alcohol, and chocolate. This information is not intended to replace advice given to you by your health care provider. Make sure you discuss any questions you have with your health care provider. Document Revised: 02/11/2019 Document Reviewed: 11/26/2016 Elsevier Patient Education  [place].

## 2019-12-22 NOTE — Addendum Note (Signed)
Addended by: June Leap on: 12/22/2019 05:29 PM   Modules accepted: Orders

## 2019-12-27 MED ORDER — FLUTICASONE FUROATE-VILANTEROL 100-25 MCG/INH IN AEPB: 1.0000 | INHALATION_SPRAY | Freq: Every day | RESPIRATORY_TRACT | 0 refills | Status: DC

## 2019-12-27 NOTE — Addendum Note (Signed)
Addended by: June Leap on: 12/27/2019 04:00 PM   Modules accepted: Orders

## 2020-02-09 ENCOUNTER — Other Ambulatory Visit: Payer: Self-pay | Admitting: Internal Medicine

## 2020-02-16 ENCOUNTER — Other Ambulatory Visit: Payer: Self-pay

## 2020-02-16 ENCOUNTER — Encounter: Payer: Self-pay | Admitting: Internal Medicine

## 2020-02-16 ENCOUNTER — Telehealth (INDEPENDENT_AMBULATORY_CARE_PROVIDER_SITE_OTHER): Payer: Self-pay | Admitting: Internal Medicine

## 2020-02-16 VITALS — Ht 60.0 in | Wt 146.0 lb

## 2020-02-16 DIAGNOSIS — U071 COVID-19: Secondary | ICD-10-CM

## 2020-02-16 DIAGNOSIS — I10 Essential (primary) hypertension: Secondary | ICD-10-CM

## 2020-02-16 DIAGNOSIS — J45991 Cough variant asthma: Secondary | ICD-10-CM

## 2020-02-16 NOTE — Progress Notes (Signed)
Virtual Visit via Video Note  I connected with@ on 02/16/20 at 11:00 AM EDT by a video enabled telemedicine application and verified that I am speaking with the correct person using two identifiers. Location patient: home Location provider:work  office Persons participating in the virtual visit: patient, provider  [place]th national recommendations  regarding COVID 19 pandemic   video visit is advised over in office visit for this patient.  Patient aware  of the limitations of evaluation and management by telemedicine and  availability of in person appointments. and agreed to proceed.   HPI: [name] presents for video visit onset  Sluggishness on April 11  And then  Myalgias mild cough  And vomits after coughing   Tested pos for covid done yesterday  Diarrhea x 2  No inc sob at this time   Co extreme me myalgias and maliase hard to take in fulids  Daughter who livew with her going out to get pedialyte  To hydrate  [facility] and was told had no fever but pulse ox 91   Has been unable to take her BP med at this time  NO known exposures  Traveled by care to [place] family gathering 5-6 people vaccinated   Who were well  Came home April 6  Had neg covid test  Days later  .   Has not been working   From last concerns at her prev work place.   ROS: See pertinent positives and negatives per HPI.  Past Medical History:  Diagnosis Date  . 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   . Allergy   . Anemia   . Asthma   . GERD (gastroesophageal reflux disease)   . Heart murmur   . Hypertension   . Positive TB test   . Shingles   . TOBACCO USE 03/22/2009   Qualifier: Diagnosis of  By: Regis Bill MD, [name]  Stopped Dec 12 2010      Past Surgical History:  Procedure Laterality Date  . ABDOMINAL HYSTERECTOMY    . BREAST SURGERY     cyst from left breast  . BUNIONECTOMY     both feet  . CHOLECYSTECTOMY    . CYSTECTOMY     left shoulder  . KNEE SURGERY  Left   . PANENDOSCOPY    . TUBAL LIGATION    . uterine ablastion     and polyps removed  . Uterine polyps      Family History  Problem Relation Age of Onset  . Hypertension Mother   . Alzheimer's disease Mother   . Hypertension Father   . Prostate cancer Father   . HIV Brother   . Lupus Sister   . Colon cancer Paternal Grandmother   . Esophageal cancer Neg Hx   . Liver cancer Neg Hx   . Pancreatic cancer Neg Hx   . Rectal cancer Neg Hx   . Stomach cancer Neg Hx     Social History   Tobacco Use  . Smoking status: Former Smoker    Packs/day: 0.50    Years: 17.00    Pack years: 8.50    Types: Cigarettes    Quit date: 12/12/2010    Years since quitting: 9.1  . Smokeless tobacco: Never Used  Substance Use Topics  . Alcohol use: No  . Drug use: No      Current Outpatient Medications:  .  albuterol (PROVENTIL) (2.5 MG/3ML) 0.083% nebulizer solution,  Take 3 mLs (2.5 mg total) by nebulization every 6 (six) hours as needed for wheezing or shortness of breath., Disp: 125 mL, Rfl: 12 .  albuterol (VENTOLIN HFA) 108 (90 Base) MCG/ACT inhaler, INHALE TWO PUFFS BY MOUTH EVERY 4 HOURS AS NEEDED ONLY IF YOU CANT CATCH YOUR BREATH (Patient taking differently: Inhale 2 puffs into the lungs every 4 (four) hours as needed for wheezing or shortness of breath. ), Disp: 18 g, Rfl: 0 .  budesonide-formoterol (SYMBICORT) 80-4.5 MCG/ACT inhaler, INHALE 2 PUFFS BY MOUTH INTO THE LUNGS TWICE DAILY, Disp: 1 Inhaler, Rfl: 0 .  EPINEPHRINE 0.3 mg/0.3 mL IJ SOAJ injection, INJECT 1 SYRINGE INTO THE MUSCLE AS NEEDED FOR ANAPHYLAXIS (Patient taking differently: Inject 0.3 mg into the muscle as needed for anaphylaxis. ), Disp: 2 each, Rfl: 1 .  famotidine (PEPCID) 20 MG tablet, One at bedtime (Patient taking differently: Take 20 mg by mouth at bedtime. One at bedtime ), Disp: 60 tablet, Rfl: 11 .  fluticasone (FLONASE) 50 MCG/ACT nasal spray, Place 2 sprays into both nostrils daily., Disp: , Rfl:  .   fluticasone furoate-vilanterol (BREO ELLIPTA) 100-25 MCG/INH AEPB, Inhale 1 puff into the lungs daily., Disp: 2 each, Rfl: 0 .  gabapentin (NEURONTIN) 100 MG capsule, Take 2 capsules (200 mg total) by mouth at bedtime., Disp: 180 capsule, Rfl: 3 .  hydrochlorothiazide (HYDRODIURIL) 25 MG tablet, TAKE ONE TABLET BY MOUTH ONCE DAILY, Disp: 30 tablet, Rfl: 4 .  ibuprofen (ADVIL) 200 MG tablet, Take 400 mg by mouth every 6 (six) hours as needed for moderate pain., Disp: , Rfl:  .  Multiple Vitamins-Minerals (ZINC PO), Take 1 tablet by mouth daily., Disp: , Rfl:  .  pantoprazole (PROTONIX) 40 MG tablet, Take 1 tablet (40 mg total) by mouth 2 (two) times daily before a meal. Take 30-60 min before first meal of the day, Disp: 60 tablet, Rfl: 11 .  Spacer/Aero-Holding Chambers DEVI, Use with HFA inhaler albuterol for asthma., Disp: 1 each, Rfl: 0 .  valsartan (DIOVAN) 320 MG tablet, TAKE 1/2 TABLET BY MOUTH ONCE DAILY, Disp: 30 tablet, Rfl: 2 .  VITAMIN D PO, Take 1 capsule by mouth daily., Disp: , Rfl:  .  budesonide-formoterol (SYMBICORT) 160-4.5 MCG/ACT inhaler, Inhale 2 puffs into the lungs every 12 (twelve) hours. (Patient not taking: Reported on 02/16/2020), Disp: 1 Inhaler, Rfl: 0  EXAM: BP Readings from Last 3 Encounters:  12/11/19 120/80  09/08/19 (!) 142/98  08/13/19 110/78    VITALS per patient if applicable:  GENERAL:  Lethargic   non toxic  nnl resp rate cognition intact  No cough during interview  HEENT: atraumatic, conjunttiva clear, no obvious abnormalities on inspection of external nose and ears NECK: normal movements of the head and neck LUNGS: on inspection no signs of respiratory distress, breathing rate appears normal, no obvious gross SOB, gasping or wheezing CV: no obvious cyanosis MS: moves all visible extremities without noticeable abnormality Lab Results  Component Value Date   WBC 5.2 12/11/2019   HGB 12.4 12/11/2019   HCT 38.9 12/11/2019   PLT 328 12/11/2019    GLUCOSE 90 12/11/2019   CHOL 195 05/19/2018   TRIG 41.0 05/19/2018   HDL 83.80 05/19/2018   LDLCALC 103 (H) 05/19/2018   ALT 14 12/11/2019   AST 21 12/11/2019   NA 139 12/11/2019   K 3.8 12/11/2019   CL 103 12/11/2019   CREATININE 1.06 (H) 12/11/2019   BUN 17 12/11/2019   CO2 28 12/11/2019  TSH 0.72 05/19/2018   MICROALBUR 0.7 05/30/2009    ASSESSMENT AND PLAN:  Discussed the following assessment and plan:    ICD-10-CM   1. COVID-19 virus infection  U07.1   2. Cough variant asthma  J45.991   3. Hypertension, unspecified type  I10    covid infection   prob early hydration issues   At risk  Disc hydration and breathing  Alarm sx and seek ed care if needed At this time decline aby infusion  Supportive care and inhaler use    Tylenol to see if helps myalgia and oral intake To ed if needed  Otherwise Fu virtual on April 16  or as needed  Counseled.   Expectant management and discussion of plan and treatment with opportunity to ask questions and all were answered. The patient agreed with the plan and demonstrated an understanding of the instructions.  30 minute revewi visit counsel and fu planning  Advised to call back or seek an in-person evaluation if worsening  or having  further concerns . Return for 2 days depending   virtual   to ED if alarm sx .   Shanon Ace, MD

## 2020-02-18 ENCOUNTER — Other Ambulatory Visit: Payer: Self-pay

## 2020-02-18 ENCOUNTER — Telehealth (INDEPENDENT_AMBULATORY_CARE_PROVIDER_SITE_OTHER): Payer: Self-pay | Admitting: Internal Medicine

## 2020-02-18 ENCOUNTER — Encounter: Payer: Self-pay | Admitting: Internal Medicine

## 2020-02-18 VITALS — BP 93/77 | HR 106 | Ht 60.0 in | Wt 146.0 lb

## 2020-02-18 DIAGNOSIS — U071 COVID-19: Secondary | ICD-10-CM

## 2020-02-18 DIAGNOSIS — M545 Low back pain, unspecified: Secondary | ICD-10-CM

## 2020-02-18 DIAGNOSIS — M79606 Pain in leg, unspecified: Secondary | ICD-10-CM

## 2020-02-18 MED ORDER — ONDANSETRON 4 MG PO TBDP: 4.0000 mg | ORAL_TABLET | Freq: Three times a day (TID) | ORAL | 0 refills | Status: DC | PRN

## 2020-02-18 NOTE — Progress Notes (Signed)
Virtual Visit via Video Note  I connected with@ on 02/18/20 at  1:30 PM EDT by a video enabled telemedicine application and verified that I am speaking with the correct person using two identifiers. Location patient: home Location provider:work  office Persons participating in the virtual visit: patient, provider  [place] national recommendations  regarding COVID 19 pandemic   video visit is advised over in office visit for this patient.  Patient aware  of the limitations of evaluation and management by telemedicine and  availability of in person appointments. and agreed to proceed.   HPI: [name] presents for video visit Fu covid 19  Felt well enough tot take shower today  goess to br etc  Able to take toast  Water  Has some vomiting in am  No sob  Falling   bp was low in high 90 s  Pulse 100 range  took bp med  recnetly  ROS: See pertinent positives and negatives per HPI. No fever some chills  lbp and at times  Pain anterior thight and shins that comes and goes no swelling or  Other leg pain  Daughter in [facility] negative  Tested and not sick  Past Medical History:  Diagnosis Date  . 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   . Allergy   . Anemia   . Asthma   . GERD (gastroesophageal reflux disease)   . Heart murmur   . Hypertension   . Positive TB test   . Shingles   . TOBACCO USE 03/22/2009   Qualifier: Diagnosis of  By: Regis Bill MD, [name]  Stopped Dec 12 2010      Past Surgical History:  Procedure Laterality Date  . ABDOMINAL HYSTERECTOMY    . BREAST SURGERY     cyst from left breast  . BUNIONECTOMY     both feet  . CHOLECYSTECTOMY    . CYSTECTOMY     left shoulder  . KNEE SURGERY Left   . PANENDOSCOPY    . TUBAL LIGATION    . uterine ablastion     and polyps removed  . Uterine polyps      Family History  Problem Relation Age of Onset  . Hypertension Mother   . Alzheimer's disease Mother   . Hypertension Father   . Prostate cancer Father    . HIV Brother   . Lupus Sister   . Colon cancer Paternal Grandmother   . Esophageal cancer Neg Hx   . Liver cancer Neg Hx   . Pancreatic cancer Neg Hx   . Rectal cancer Neg Hx   . Stomach cancer Neg Hx     Social History   Tobacco Use  . Smoking status: Former Smoker    Packs/day: 0.50    Years: 17.00    Pack years: 8.50    Types: Cigarettes    Quit date: 12/12/2010    Years since quitting: 9.1  . Smokeless tobacco: Never Used  Substance Use Topics  . Alcohol use: No  . Drug use: No      Current Outpatient Medications:  .  albuterol (PROVENTIL) (2.5 MG/3ML) 0.083% nebulizer solution, Take 3 mLs (2.5 mg total) by nebulization every 6 (six) hours as needed for wheezing or shortness of breath., Disp: 125 mL, Rfl: 12 .  albuterol (VENTOLIN HFA) 108 (90 Base) MCG/ACT inhaler, INHALE TWO PUFFS BY MOUTH EVERY 4 HOURS AS NEEDED ONLY IF YOU CANT CATCH YOUR BREATH (Patient taking differently: Inhale 2  puffs into the lungs every 4 (four) hours as needed for wheezing or shortness of breath. ), Disp: 18 g, Rfl: 0 .  budesonide-formoterol (SYMBICORT) 160-4.5 MCG/ACT inhaler, Inhale 2 puffs into the lungs every 12 (twelve) hours., Disp: 1 Inhaler, Rfl: 0 .  budesonide-formoterol (SYMBICORT) 80-4.5 MCG/ACT inhaler, INHALE 2 PUFFS BY MOUTH INTO THE LUNGS TWICE DAILY, Disp: 1 Inhaler, Rfl: 0 .  EPINEPHRINE 0.3 mg/0.3 mL IJ SOAJ injection, INJECT 1 SYRINGE INTO THE MUSCLE AS NEEDED FOR ANAPHYLAXIS (Patient taking differently: Inject 0.3 mg into the muscle as needed for anaphylaxis. ), Disp: 2 each, Rfl: 1 .  famotidine (PEPCID) 20 MG tablet, One at bedtime (Patient taking differently: Take 20 mg by mouth at bedtime. One at bedtime ), Disp: 60 tablet, Rfl: 11 .  fluticasone (FLONASE) 50 MCG/ACT nasal spray, Place 2 sprays into both nostrils daily., Disp: , Rfl:  .  fluticasone furoate-vilanterol (BREO ELLIPTA) 100-25 MCG/INH AEPB, Inhale 1 puff into the lungs daily., Disp: 2 each, Rfl: 0 .   gabapentin (NEURONTIN) 100 MG capsule, Take 2 capsules (200 mg total) by mouth at bedtime., Disp: 180 capsule, Rfl: 3 .  hydrochlorothiazide (HYDRODIURIL) 25 MG tablet, TAKE ONE TABLET BY MOUTH ONCE DAILY, Disp: 30 tablet, Rfl: 4 .  ibuprofen (ADVIL) 200 MG tablet, Take 400 mg by mouth every 6 (six) hours as needed for moderate pain., Disp: , Rfl:  .  Multiple Vitamins-Minerals (ZINC PO), Take 1 tablet by mouth daily., Disp: , Rfl:  .  pantoprazole (PROTONIX) 40 MG tablet, Take 1 tablet (40 mg total) by mouth 2 (two) times daily before a meal. Take 30-60 min before first meal of the day, Disp: 60 tablet, Rfl: 11 .  Spacer/Aero-Holding Chambers DEVI, Use with HFA inhaler albuterol for asthma., Disp: 1 each, Rfl: 0 .  valsartan (DIOVAN) 320 MG tablet, TAKE 1/2 TABLET BY MOUTH ONCE DAILY, Disp: 30 tablet, Rfl: 2 .  VITAMIN D PO, Take 1 capsule by mouth daily., Disp: , Rfl:  .  ondansetron (ZOFRAN-ODT) 4 MG disintegrating tablet, Take 1 tablet (4 mg total) by mouth every 8 (eight) hours as needed for nausea or vomiting., Disp: 20 tablet, Rfl: 0  EXAM: BP Readings from Last 3 Encounters:  02/18/20 93/77  12/11/19 120/80  09/08/19 (!) 142/98    VITALS per patient if applicable:  layin gin bed nl cognition non toxic  Improved since last visit  Affect looks tired but alert   GENERAL: alert, oriented, appears well and in no acute distress nl color and breathing   HEENT: atraumatic, conjunttiva clear, no obvious abnormalities on inspection of external nose and ears  NECK: normal movements of the head and neck  LUNGS: on inspection no signs of respiratory distress, breathing rate appears normal, no obvious gross SOB, gasping or wheezing  CV: no obvious cyanosis good color   PSYCH/NEURO: pleasant and cooperative, no obvious depression or anxiety, speech and thought processing grossly intact Lab Results  Component Value Date   WBC 5.2 12/11/2019   HGB 12.4 12/11/2019   HCT 38.9 12/11/2019   PLT  328 12/11/2019   GLUCOSE 90 12/11/2019   CHOL 195 05/19/2018   TRIG 41.0 05/19/2018   HDL 83.80 05/19/2018   LDLCALC 103 (H) 05/19/2018   ALT 14 12/11/2019   AST 21 12/11/2019   NA 139 12/11/2019   K 3.8 12/11/2019   CL 103 12/11/2019   CREATININE 1.06 (H) 12/11/2019   BUN 17 12/11/2019   CO2 28 12/11/2019  TSH 0.72 05/19/2018   MICROALBUR 0.7 05/30/2009    ASSESSMENT AND PLAN:  Discussed the following assessment and plan:    ICD-10-CM   1. COVID-19 virus infection  U07.1   2. Pain of anterior lower extremity, unspecified laterality  M79.606    thighs and shins comes and goes  no weakness or swelling  3. Midline low back pain without sciatica, unspecified chronicity  M54.5    not assoc with leg pain  not typical is mostly in bed no weakness    Do not take BP med until bp rises up to 130 range  If back pain leg pain  persistent or progressive or effecting ability to walk then seek ed care or such  Hydration disc  Malaise and fatigue disc and signs of dvt if develops  Counseled.   Expectant management and discussion of plan and treatment with opportunity to ask questions and all were answered. The patient agreed with the plan and demonstrated an understanding of the instructions.   Advised to call back or seek an in-person evaluation if worsening  or having  further concerns . Return for monday virtual .  Shanon Ace, MD

## 2020-02-20 NOTE — Progress Notes (Signed)
Virtual Visit via Video Note  I connected with@ on 02/21/20 at 10:30 AM EDT by a video enabled telemedicine application and verified that I am speaking with the correct person using two identifiers. Location patient: home Location provider:work office Persons participating in the virtual visit: patient, provider  [place] national recommendations  regarding COVID 19 pandemic   video visit is advised over in office visit for this patient.  Patient aware  of the limitations of evaluation and management by telemedicine and  availability of in person appointments. and agreed to proceed.   HPI: [name] presents for video visit  Day 9- 10 covid  Over weekend had one night of drenching sweats. No sob but feels full in upper chest trach area no stridor  No cp sob taking her inhalers.  No diarreha eating some nutiriotn   Cough spells  Usually white   phelgem and ocass yellow  No blood .  Over all still very tired but better than last week  Back pain still there legs feel weak and wobbly at timesa after up and walking but no fallin does feel like giing out no syncop.     ROS: See pertinent positives and negatives per HPI.  Past Medical History:  Diagnosis Date  . 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   . Allergy   . Anemia   . Asthma   . GERD (gastroesophageal reflux disease)   . Heart murmur   . Hypertension   . Positive TB test   . Shingles   . TOBACCO USE 03/22/2009   Qualifier: Diagnosis of  By: Regis Bill MD, [name]  Stopped Dec 12 2010      Past Surgical History:  Procedure Laterality Date  . ABDOMINAL HYSTERECTOMY    . BREAST SURGERY     cyst from left breast  . BUNIONECTOMY     both feet  . CHOLECYSTECTOMY    . CYSTECTOMY     left shoulder  . KNEE SURGERY Left   . PANENDOSCOPY    . TUBAL LIGATION    . uterine ablastion     and polyps removed  . Uterine polyps      Family History  Problem Relation Age of Onset  . Hypertension Mother   .  Alzheimer's disease Mother   . Hypertension Father   . Prostate cancer Father   . HIV Brother   . Lupus Sister   . Colon cancer Paternal Grandmother   . Esophageal cancer Neg Hx   . Liver cancer Neg Hx   . Pancreatic cancer Neg Hx   . Rectal cancer Neg Hx   . Stomach cancer Neg Hx     Social History   Tobacco Use  . Smoking status: Former Smoker    Packs/day: 0.50    Years: 17.00    Pack years: 8.50    Types: Cigarettes    Quit date: 12/12/2010    Years since quitting: 9.2  . Smokeless tobacco: Never Used  Substance Use Topics  . Alcohol use: No  . Drug use: No      Current Outpatient Medications:  .  albuterol (PROVENTIL) (2.5 MG/3ML) 0.083% nebulizer solution, Take 3 mLs (2.5 mg total) by nebulization every 6 (six) hours as needed for wheezing or shortness of breath., Disp: 125 mL, Rfl: 12 .  albuterol (VENTOLIN HFA) 108 (90 Base) MCG/ACT inhaler, INHALE TWO PUFFS BY MOUTH EVERY 4 HOURS AS NEEDED ONLY IF YOU CANT CATCH YOUR BREATH (  Patient taking differently: Inhale 2 puffs into the lungs every 4 (four) hours as needed for wheezing or shortness of breath. ), Disp: 18 g, Rfl: 0 .  budesonide-formoterol (SYMBICORT) 160-4.5 MCG/ACT inhaler, Inhale 2 puffs into the lungs every 12 (twelve) hours., Disp: 1 Inhaler, Rfl: 0 .  budesonide-formoterol (SYMBICORT) 80-4.5 MCG/ACT inhaler, INHALE 2 PUFFS BY MOUTH INTO THE LUNGS TWICE DAILY, Disp: 1 Inhaler, Rfl: 0 .  EPINEPHRINE 0.3 mg/0.3 mL IJ SOAJ injection, INJECT 1 SYRINGE INTO THE MUSCLE AS NEEDED FOR ANAPHYLAXIS (Patient taking differently: Inject 0.3 mg into the muscle as needed for anaphylaxis. ), Disp: 2 each, Rfl: 1 .  famotidine (PEPCID) 20 MG tablet, One at bedtime (Patient taking differently: Take 20 mg by mouth at bedtime. One at bedtime ), Disp: 60 tablet, Rfl: 11 .  fluticasone (FLONASE) 50 MCG/ACT nasal spray, Place 2 sprays into both nostrils daily., Disp: , Rfl:  .  fluticasone furoate-vilanterol (BREO ELLIPTA) 100-25  MCG/INH AEPB, Inhale 1 puff into the lungs daily., Disp: 2 each, Rfl: 0 .  gabapentin (NEURONTIN) 100 MG capsule, Take 2 capsules (200 mg total) by mouth at bedtime., Disp: 180 capsule, Rfl: 3 .  hydrochlorothiazide (HYDRODIURIL) 25 MG tablet, TAKE ONE TABLET BY MOUTH ONCE DAILY, Disp: 30 tablet, Rfl: 4 .  ibuprofen (ADVIL) 200 MG tablet, Take 400 mg by mouth every 6 (six) hours as needed for moderate pain., Disp: , Rfl:  .  Multiple Vitamins-Minerals (ZINC PO), Take 1 tablet by mouth daily., Disp: , Rfl:  .  ondansetron (ZOFRAN-ODT) 4 MG disintegrating tablet, Take 1 tablet (4 mg total) by mouth every 8 (eight) hours as needed for nausea or vomiting., Disp: 20 tablet, Rfl: 0 .  pantoprazole (PROTONIX) 40 MG tablet, Take 1 tablet (40 mg total) by mouth 2 (two) times daily before a meal. Take 30-60 min before first meal of the day, Disp: 60 tablet, Rfl: 11 .  Spacer/Aero-Holding Chambers DEVI, Use with HFA inhaler albuterol for asthma., Disp: 1 each, Rfl: 0 .  valsartan (DIOVAN) 320 MG tablet, TAKE 1/2 TABLET BY MOUTH ONCE DAILY, Disp: 30 tablet, Rfl: 2 .  VITAMIN D PO, Take 1 capsule by mouth daily., Disp: , Rfl:   EXAM: BP Readings from Last 3 Encounters:  02/21/20 113/80  02/18/20 93/77  12/11/19 120/80    VITALS per patient if applicable:  GENERAL: alert, oriented, appears non toxic  More alert a and in no acute distress had couhging fit  But no dyspnea obvious  Sounds bronchial.   HEENT: atraumatic, conjunttiva clear, no obvious abnormalities on inspection of external nose and ears NECK: normal movements of the head and neck LUNGS: on inspection no signs of respiratory distress, breathing rate appears normal, no obvious gross SOB, gasping or wheezing CV: no obvious cyanosis MS: moves all visible extremities without noticeable abnormality PSYCH/NEURO: pleasant and cooperative, no obvious depression or anxiety, speech and thought processing grossly intact   ASSESSMENT AND  PLAN:  Discussed the following assessment and plan:    ICD-10-CM   1. COVID-19 virus infection  U07.1   2. Cough variant asthma  J45.991    stable  3. Follow up  Z09   4. Medication management  Z79.899   5. Midline low back pain without sciatica, unspecified chronicity  M54.5    no progression to alarm sx   Moderate sx  At this time reviewed indications  for seeking  Emergent urgent care   In person and or x ray etc  Plan fu visit 3-4 days   But send a message if worse  Infected phlegm etc  Counseled.   Expectant management and discussion of plan and treatment with opportunity to ask questions and all were answered. The patient agreed with the plan and demonstrated an understanding of the instructions.   Advised to call back or seek an in-person evaluation if worsening  or having  further concerns . Return for thursday or friday.   Shanon Ace, MD

## 2020-02-20 NOTE — Progress Notes (Signed)
Opened in error

## 2020-02-21 ENCOUNTER — Other Ambulatory Visit: Payer: Self-pay

## 2020-02-21 ENCOUNTER — Encounter: Payer: Self-pay | Admitting: Internal Medicine

## 2020-02-21 ENCOUNTER — Telehealth (INDEPENDENT_AMBULATORY_CARE_PROVIDER_SITE_OTHER): Payer: Self-pay | Admitting: Internal Medicine

## 2020-02-21 VITALS — BP 113/80 | HR 83 | Ht 60.0 in

## 2020-02-21 DIAGNOSIS — Z79899 Other long term (current) drug therapy: Secondary | ICD-10-CM

## 2020-02-21 DIAGNOSIS — M545 Low back pain, unspecified: Secondary | ICD-10-CM

## 2020-02-21 DIAGNOSIS — J45991 Cough variant asthma: Secondary | ICD-10-CM

## 2020-02-21 DIAGNOSIS — Z09 Encounter for follow-up examination after completed treatment for conditions other than malignant neoplasm: Secondary | ICD-10-CM

## 2020-02-21 DIAGNOSIS — U071 COVID-19: Secondary | ICD-10-CM

## 2020-02-24 ENCOUNTER — Encounter: Payer: Self-pay | Admitting: Internal Medicine

## 2020-02-24 ENCOUNTER — Other Ambulatory Visit: Payer: Self-pay

## 2020-02-24 ENCOUNTER — Telehealth (INDEPENDENT_AMBULATORY_CARE_PROVIDER_SITE_OTHER): Payer: Self-pay | Admitting: Internal Medicine

## 2020-02-24 VITALS — Ht 60.0 in

## 2020-02-24 DIAGNOSIS — J45991 Cough variant asthma: Secondary | ICD-10-CM

## 2020-02-24 DIAGNOSIS — U071 COVID-19: Secondary | ICD-10-CM

## 2020-02-24 DIAGNOSIS — I10 Essential (primary) hypertension: Secondary | ICD-10-CM

## 2020-02-24 NOTE — Progress Notes (Signed)
Virtual Visit via Video Note  I connected with@ on 02/24/20 at 10:30 AM EDT by a video enabled telemedicine application and verified that I am speaking with the correct person using two identifiers. Location patient: home Location provider:work or home office Persons participating in the virtual visit: patient, provider  [place] national recommendations  regarding COVID 19 pandemic   video visit is advised over in office visit for this patient.  Patient aware  of the limitations of evaluation and management by telemedicine and  availability of in person appointments. and agreed to proceed.   HPI: [name] presents for video visit  Fu covid 19 infection  Since last chck she is doing myuch better  Was able to "eat 4 x " yesterday  And back anc leg aches are gone.  She has help the BP med cause bp was  In the 118 range  . No vomiting diarrhea   Breathing good cough spells  improving   ROS: See pertinent positives and negatives per HPI.  Past Medical History:  Diagnosis Date  . 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   . Allergy   . Anemia   . Asthma   . GERD (gastroesophageal reflux disease)   . Heart murmur   . Hypertension   . Positive TB test   . Shingles   . TOBACCO USE 03/22/2009   Qualifier: Diagnosis of  By: Regis Bill MD, [name]  Stopped Dec 12 2010      Past Surgical History:  Procedure Laterality Date  . ABDOMINAL HYSTERECTOMY    . BREAST SURGERY     cyst from left breast  . BUNIONECTOMY     both feet  . CHOLECYSTECTOMY    . CYSTECTOMY     left shoulder  . KNEE SURGERY Left   . PANENDOSCOPY    . TUBAL LIGATION    . uterine ablastion     and polyps removed  . Uterine polyps      Family History  Problem Relation Age of Onset  . Hypertension Mother   . Alzheimer's disease Mother   . Hypertension Father   . Prostate cancer Father   . HIV Brother   . Lupus Sister   . Colon cancer Paternal Grandmother   . Esophageal cancer Neg Hx   .  Liver cancer Neg Hx   . Pancreatic cancer Neg Hx   . Rectal cancer Neg Hx   . Stomach cancer Neg Hx     Social History   Tobacco Use  . Smoking status: Former Smoker    Packs/day: 0.50    Years: 17.00    Pack years: 8.50    Types: Cigarettes    Quit date: 12/12/2010    Years since quitting: 9.2  . Smokeless tobacco: Never Used  Substance Use Topics  . Alcohol use: No  . Drug use: No      Current Outpatient Medications:  .  albuterol (PROVENTIL) (2.5 MG/3ML) 0.083% nebulizer solution, Take 3 mLs (2.5 mg total) by nebulization every 6 (six) hours as needed for wheezing or shortness of breath., Disp: 125 mL, Rfl: 12 .  albuterol (VENTOLIN HFA) 108 (90 Base) MCG/ACT inhaler, INHALE TWO PUFFS BY MOUTH EVERY 4 HOURS AS NEEDED ONLY IF YOU CANT CATCH YOUR BREATH (Patient taking differently: Inhale 2 puffs into the lungs every 4 (four) hours as needed for wheezing or shortness of breath. ), Disp: 18 g, Rfl: 0 .  budesonide-formoterol (SYMBICORT) 160-4.5 MCG/ACT inhaler,  Inhale 2 puffs into the lungs every 12 (twelve) hours., Disp: 1 Inhaler, Rfl: 0 .  budesonide-formoterol (SYMBICORT) 80-4.5 MCG/ACT inhaler, INHALE 2 PUFFS BY MOUTH INTO THE LUNGS TWICE DAILY, Disp: 1 Inhaler, Rfl: 0 .  EPINEPHRINE 0.3 mg/0.3 mL IJ SOAJ injection, INJECT 1 SYRINGE INTO THE MUSCLE AS NEEDED FOR ANAPHYLAXIS (Patient taking differently: Inject 0.3 mg into the muscle as needed for anaphylaxis. ), Disp: 2 each, Rfl: 1 .  famotidine (PEPCID) 20 MG tablet, One at bedtime (Patient taking differently: Take 20 mg by mouth at bedtime. One at bedtime ), Disp: 60 tablet, Rfl: 11 .  fluticasone (FLONASE) 50 MCG/ACT nasal spray, Place 2 sprays into both nostrils daily., Disp: , Rfl:  .  fluticasone furoate-vilanterol (BREO ELLIPTA) 100-25 MCG/INH AEPB, Inhale 1 puff into the lungs daily., Disp: 2 each, Rfl: 0 .  gabapentin (NEURONTIN) 100 MG capsule, Take 2 capsules (200 mg total) by mouth at bedtime., Disp: 180 capsule, Rfl:  3 .  hydrochlorothiazide (HYDRODIURIL) 25 MG tablet, TAKE ONE TABLET BY MOUTH ONCE DAILY, Disp: 30 tablet, Rfl: 4 .  ibuprofen (ADVIL) 200 MG tablet, Take 400 mg by mouth every 6 (six) hours as needed for moderate pain., Disp: , Rfl:  .  Multiple Vitamins-Minerals (ZINC PO), Take 1 tablet by mouth daily., Disp: , Rfl:  .  ondansetron (ZOFRAN-ODT) 4 MG disintegrating tablet, Take 1 tablet (4 mg total) by mouth every 8 (eight) hours as needed for nausea or vomiting., Disp: 20 tablet, Rfl: 0 .  pantoprazole (PROTONIX) 40 MG tablet, Take 1 tablet (40 mg total) by mouth 2 (two) times daily before a meal. Take 30-60 min before first meal of the day, Disp: 60 tablet, Rfl: 11 .  Spacer/Aero-Holding Chambers DEVI, Use with HFA inhaler albuterol for asthma., Disp: 1 each, Rfl: 0 .  valsartan (DIOVAN) 320 MG tablet, TAKE 1/2 TABLET BY MOUTH ONCE DAILY, Disp: 30 tablet, Rfl: 2 .  VITAMIN D PO, Take 1 capsule by mouth daily., Disp: , Rfl:   EXAM: BP Readings from Last 3 Encounters:  02/21/20 113/80  02/18/20 93/77  12/11/19 120/80   Wt Readings from Last 3 Encounters:  02/18/20 146 lb (66.2 kg)  02/16/20 146 lb (66.2 kg)  12/11/19 148 lb (67.1 kg)     VITALS per patient if applicable:  GENERAL: alert, oriented, appears well and in no acute distress look muhc better an well  ( out of bed non toxic   HEENT: atraumatic, conjunttiva clear, no obvious abnormalities on inspection of external nose and ears  NECK: normal movements of the head and neck  LUNGS: on inspection no signs of respiratory distress, breathing rate appears normal, no obvious gross SOB, gasping or wheezing  CV: no obvious cyanosis  MS: moves all visible extremities without noticeable abnormality  PSYCH/NEURO: pleasant and cooperative, no obvious depression or anxiety, speech and thought processing grossly intact Lab Results  Component Value Date   WBC 5.2 12/11/2019   HGB 12.4 12/11/2019   HCT 38.9 12/11/2019   PLT 328  12/11/2019   GLUCOSE 90 12/11/2019   CHOL 195 05/19/2018   TRIG 41.0 05/19/2018   HDL 83.80 05/19/2018   LDLCALC 103 (H) 05/19/2018   ALT 14 12/11/2019   AST 21 12/11/2019   NA 139 12/11/2019   K 3.8 12/11/2019   CL 103 12/11/2019   CREATININE 1.06 (H) 12/11/2019   BUN 17 12/11/2019   CO2 28 12/11/2019   TSH 0.72 05/19/2018   MICROALBUR 0.7 05/30/2009  ASSESSMENT AND PLAN:  Discussed the following assessment and plan:    ICD-10-CM   1. COVID-19 virus infection  U07.1   2. Cough variant asthma  J45.991   3. Hypertension, unspecified type  I10    Much improved expect  Recovering  But check with [place] if any relapsing sx of concern  Expect ongoing  fatigue etc until better   Consider vaccine in 3+ months depending on the status    Can add ack bp med when bp rises  up Counseled.  Check as needed  Relapsing sx etc.   Expectant management and discussion of plan and treatment with opportunity to ask questions and all were answered. The patient agreed with the plan and demonstrated an understanding of the instructions.   Advised to call back or seek an in-person evaluation if worsening  or having  further concerns . Return if symptoms worsen or fail to improve as expected.    Shanon Ace, MD

## 2020-03-22 ENCOUNTER — Other Ambulatory Visit: Payer: Self-pay | Admitting: Internal Medicine

## 2020-03-23 NOTE — Telephone Encounter (Signed)
Last visit virtual 02/24/20  I can see in the past history that you filled this in 2013. Still on current med list and shows historical provider.  OK to fill?

## 2020-06-24 ENCOUNTER — Other Ambulatory Visit: Payer: Self-pay | Admitting: Gastroenterology

## 2020-08-04 ENCOUNTER — Other Ambulatory Visit: Payer: Self-pay | Admitting: Internal Medicine

## 2020-09-08 ENCOUNTER — Emergency Department (HOSPITAL_COMMUNITY)
Admission: EM | Admit: 2020-09-08 | Discharge: 2020-09-08 | Disposition: A | Discharge status: Home / Self Care | Payer: Self-pay | Attending: Emergency Medicine | Admitting: Emergency Medicine

## 2020-09-08 ENCOUNTER — Encounter (HOSPITAL_COMMUNITY): Payer: Self-pay

## 2020-09-08 ENCOUNTER — Other Ambulatory Visit: Payer: Self-pay

## 2020-09-08 ENCOUNTER — Emergency Department (HOSPITAL_COMMUNITY): Payer: Self-pay

## 2020-09-08 DIAGNOSIS — Z91010 Allergy to peanuts: Secondary | ICD-10-CM | POA: Insufficient documentation

## 2020-09-08 DIAGNOSIS — Z79899 Other long term (current) drug therapy: Secondary | ICD-10-CM | POA: Insufficient documentation

## 2020-09-08 DIAGNOSIS — Z7951 Long term (current) use of inhaled steroids: Secondary | ICD-10-CM | POA: Insufficient documentation

## 2020-09-08 DIAGNOSIS — I10 Essential (primary) hypertension: Secondary | ICD-10-CM | POA: Insufficient documentation

## 2020-09-08 DIAGNOSIS — R079 Chest pain, unspecified: Secondary | ICD-10-CM | POA: Insufficient documentation

## 2020-09-08 DIAGNOSIS — Z20822 Contact with and (suspected) exposure to covid-19: Secondary | ICD-10-CM | POA: Insufficient documentation

## 2020-09-08 DIAGNOSIS — Z87891 Personal history of nicotine dependence: Secondary | ICD-10-CM | POA: Insufficient documentation

## 2020-09-08 DIAGNOSIS — J45909 Unspecified asthma, uncomplicated: Secondary | ICD-10-CM | POA: Insufficient documentation

## 2020-09-08 LAB — BASIC METABOLIC PANEL
Anion gap: 10 (ref 5–15)
BUN: 13 mg/dL (ref 6–20)
CO2: 27 mmol/L (ref 22–32)
Calcium: 9 mg/dL (ref 8.9–10.3)
Chloride: 104 mmol/L (ref 98–111)
Creatinine, Ser: 1.01 mg/dL — ABNORMAL HIGH (ref 0.44–1.00)
GFR, Estimated: >60 mL/min (ref >60)
Glucose, Bld: 87 mg/dL (ref 70–99)
Potassium: 4.3 mmol/L (ref 3.5–5.1)
Sodium: 141 mmol/L (ref 135–145)

## 2020-09-08 LAB — CBC
HCT: 37.3 % (ref 36.0–46.0)
Hemoglobin: 11.9 g/dL — ABNORMAL LOW (ref 12.0–15.0)
MCH: 27.9 pg (ref 26.0–34.0)
MCHC: 31.9 g/dL (ref 30.0–36.0)
MCV: 87.4 fL (ref 80.0–100.0)
Platelets: 286 10*3/uL (ref 150–400)
RBC: 4.27 MIL/uL (ref 3.87–5.11)
RDW: 15.7 % — ABNORMAL HIGH (ref 11.5–15.5)
WBC: 4.7 10*3/uL (ref 4.0–10.5)
nRBC: 0 % (ref 0.0–0.2)

## 2020-09-08 LAB — RESPIRATORY PANEL BY RT PCR (FLU A&B, COVID)
Influenza A by PCR: NEGATIVE
Influenza B by PCR: NEGATIVE
SARS Coronavirus 2 by RT PCR: NEGATIVE

## 2020-09-08 LAB — D-DIMER, QUANTITATIVE: D-Dimer, Quant: 0.33 ug/mL-FEU (ref 0.00–0.50)

## 2020-09-08 LAB — TROPONIN I (HIGH SENSITIVITY)
Troponin I (High Sensitivity): <2 ng/L (ref <18)
Troponin I (High Sensitivity): 3 ng/L (ref <18)

## 2020-09-08 LAB — I-STAT BETA HCG BLOOD, ED (MC, WL, AP ONLY): I-stat hCG, quantitative: <5 m[IU]/mL (ref <5)

## 2020-09-08 MED ORDER — METHOCARBAMOL 500 MG PO TABS: 500.0000 mg | ORAL_TABLET | Freq: Three times a day (TID) | ORAL | 0 refills | Status: DC | PRN

## 2020-09-08 MED ORDER — METHOCARBAMOL 500 MG PO TABS
500.0000 mg | ORAL_TABLET | Freq: Once | ORAL | Status: AC
  Administered 2020-09-08: 500 mg via ORAL
  Filled 2020-09-08: qty 1

## 2020-09-08 MED ORDER — KETOROLAC TROMETHAMINE 30 MG/ML IJ SOLN
30.0000 mg | Freq: Once | INTRAMUSCULAR | Status: AC
  Administered 2020-09-08: 30 mg via INTRAVENOUS
  Filled 2020-09-08: qty 1

## 2020-09-08 MED FILL — METHOCARBAMOL 500 MG TABS: 500 | 3 days supply | Qty: 10 | Fill #0

## 2020-09-08 NOTE — ED Provider Notes (Signed)
[facility] DEPT Provider Note   CSN: [id number] Arrival date & time: 09/08/20  1312     History Chief Complaint  Patient presents with  . Chest Pain    Jordan R Pyka is a 54 y.o. female with a history of reflux on Protonix and Pepcid, heart murmur, hypertension, former smoker (quit 2012) presenting to emergency department with chest pain.  She reports gradual onset of left-sided chest pain 3 nights ago, after giving herself an albuterol nebulizer treatment for wheezing.  She describes a sensation as a bandlike tightness around her epigastrium and the left side of her chest, although sometimes radiates around the right side of the chest as well.  It went away completely Tuesday night, then returned for the past 2 days.  It is been persistent.  Is currently 9 out of 10 intensity.  She also describes feeling excessively fatigued and feeling she has low energy, with difficulty walking for the past 2 to 3 days.  Nothing makes the pain better or worse.  She denies any persistent cough, fevers, chills, shortness of breath.  She did not receive the COVID vaccines.  She reports that she had a similar episode of chest pain 2 years ago when she was told "my heart was enlarged," but it had improved.  Per medical chart review, last echo was 06/12/2018:  - Left ventricle: The cavity size was normal. There was moderate  concentric hypertrophy. Systolic function was normal. The  estimated ejection fraction was in the range of 60% to 65%. Wall  motion was normal; there were no regional wall motion  abnormalities. Doppler parameters are consistent with abnormal  left ventricular relaxation (grade 1 diastolic dysfunction). The  E/e&' ratio is between 8-15, suggesting indeterminate LV filling  pressure.  - Mitral valve: Mildly thickened leaflets . There was trivial  regurgitation.  - Left atrium: The atrium was normal in size.  - Inferior vena cava: The  vessel was normal in size. The  respirophasic diameter changes were in the normal range (>= 50%),  consistent with normal central venous pressure.   She has not seen her cardiologist since then.    Seen by Dr Shanon Ace in 2020 for chest pressure, treated for reflux at that time.  She reportedly had covid earlier this year tested positive at [facility] - noted by PCP's note on 02/16/20.  Today she did not take her BP medication this morning because "my pressure was low."  No hx of DVT or PE  HPI     Past Medical History:  Diagnosis Date  . 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   . Allergy   . Anemia   . Asthma   . GERD (gastroesophageal reflux disease)   . Heart murmur   . Hypertension   . Positive TB test   . Shingles   . TOBACCO USE 03/22/2009   Qualifier: Diagnosis of  By: Regis Bill MD, [name]  Stopped Dec 12 2010      Patient Active Problem List   Diagnosis Date Noted  . Right hip pain 08/13/2019  . Greater trochanteric bursitis of both hips 07/01/2019  . Headache 07/02/2013  . Cough variant asthma 09/29/2009  . Essential hypertension 05/30/2009  . GERD 05/12/2008  . HOT FLASHES 05/12/2008  . 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 ALLERGIC RHINITIS 08/07/2007    Past Surgical History:  Procedure Laterality Date  . ABDOMINAL HYSTERECTOMY    . BREAST SURGERY  cyst from left breast  . BUNIONECTOMY     both feet  . CHOLECYSTECTOMY    . CYSTECTOMY     left shoulder  . KNEE SURGERY Left   . PANENDOSCOPY    . TUBAL LIGATION    . uterine ablastion     and polyps removed  . Uterine polyps       OB History   No obstetric history on file.     Family History  Problem Relation Age of Onset  . Hypertension Mother   . Alzheimer's disease Mother   . Hypertension Father   . Prostate cancer Father   . HIV Brother   . Lupus Sister   . Colon cancer Paternal Grandmother   . Esophageal cancer Neg Hx   . Liver cancer Neg Hx   . Pancreatic cancer Neg Hx    . Rectal cancer Neg Hx   . Stomach cancer Neg Hx     Social History   Tobacco Use  . Smoking status: Former Smoker    Packs/day: 0.50    Years: 17.00    Pack years: 8.50    Types: Cigarettes    Quit date: 12/12/2010    Years since quitting: 9.7  . Smokeless tobacco: Never Used  Vaping Use  . Vaping Use: Never used  Substance Use Topics  . Alcohol use: No  . Drug use: No    Home Medications Prior to Admission medications   Medication Sig Start Date End Date Taking? Authorizing Provider  albuterol (PROVENTIL) (2.5 MG/3ML) 0.083% nebulizer solution Take 3 mLs (2.5 mg total) by nebulization every 6 (six) hours as needed for wheezing or shortness of breath. 04/27/19   [name], [name], PA-C  albuterol (VENTOLIN HFA) 108 (90 Base) MCG/ACT inhaler INHALE TWO PUFFS BY MOUTH EVERY 4 HOURS AS NEEDED ONLY IF YOU CANT CATCH YOUR BREATH Patient taking differently: Inhale 2 puffs into the lungs every 4 (four) hours as needed for wheezing or shortness of breath.  03/19/19   [name], MD  budesonide-formoterol (SYMBICORT) 160-4.5 MCG/ACT inhaler Inhale 2 puffs into the lungs every 12 (twelve) hours. 09/08/19   [name], MD  budesonide-formoterol (SYMBICORT) 80-4.5 MCG/ACT inhaler INHALE 2 PUFFS BY MOUTH INTO THE LUNGS TWICE DAILY 08/23/19   [name], MD  EPINEPHRINE 0.3 mg/0.3 mL IJ SOAJ injection INJECT 1 SYRINGE INTO THE MUSCLE AS NEEDED FOR ANAPHYLAXIS Patient taking differently: Inject 0.3 mg into the muscle as needed for anaphylaxis.  08/19/19   [name], [name], MD  famotidine (PEPCID) 20 MG tablet One at bedtime Patient taking differently: Take 20 mg by mouth at bedtime. One at bedtime  11/11/18   [name], MD  fluticasone [facility]) 50 MCG/ACT nasal spray USE 2 SPRAYS IN [facility] NOSTRIL DAILY 03/23/20   [name], [name], MD  fluticasone furoate-vilanterol (BREO ELLIPTA) 100-25 MCG/INH AEPB Inhale 1 puff into the lungs daily. 12/22/19   Martyn Ehrich, NP  gabapentin  (NEURONTIN) 100 MG capsule Take 2 capsules (200 mg total) by mouth at bedtime. 07/01/19   Lyndal Pulley, DO  hydrochlorothiazide (HYDRODIURIL) 25 MG tablet TAKE ONE TABLET BY MOUTH ONCE DAILY 03/23/20   [name], [name], MD  ibuprofen (ADVIL) 200 MG tablet Take 400 mg by mouth every 6 (six) hours as needed for moderate pain.    [provider]  Multiple Vitamins-Minerals (ZINC PO) Take 1 tablet by mouth daily.    [provider]  ondansetron (ZOFRAN-ODT) 4 MG disintegrating  tablet Take 1 tablet (4 mg total) by mouth every 8 (eight) hours as needed for nausea or vomiting. 02/18/20   [name], [name], MD  pantoprazole (PROTONIX) 40 MG tablet TAKE ONE TABLET BY MOUTH TWICE DAILY BEFORE MEALS. TAKE 30 TO 60 MINUTES BEFORE FIRST MEAL OF THE DAY 06/26/20   [name], MD  Spacer/Aero-Holding Dorise Bullion Use with [facility] inhaler albuterol for asthma. 02/19/19   [name], [name], MD  valsartan (DIOVAN) 320 MG tablet TAKE 1/2 TABLET BY MOUTH ONCE DAILY 02/09/20   [name], [name], MD  VITAMIN D PO Take 1 capsule by mouth daily.    [provider]    Allergies    Diovan hct [valsartan-hydrochlorothiazide]; Purified water [water, sterile]; Allegra [fexofenadine]; Chocolate; Cimetidine; Citric acid; Coly-mycin s; Peanut-containing drug products; Valsartan; and Amlodipine  Review of Systems   Review of Systems  Constitutional: Positive for fatigue. Negative for chills and fever.  Eyes: Negative for pain and visual disturbance.  Respiratory: Negative for cough and shortness of breath.   Cardiovascular: Positive for chest pain. Negative for palpitations.  Gastrointestinal: Negative for abdominal pain and vomiting.  Genitourinary: Negative for dysuria and hematuria.  Musculoskeletal: Negative for arthralgias and myalgias.  Skin: Negative for color change and rash.  Neurological: Negative for syncope, light-headedness and headaches.  Psychiatric/Behavioral: Negative for agitation and  confusion.  All other systems reviewed and are negative.   Physical Exam Updated Vital Signs BP (!) 165/100 (BP Location: Left Arm)   Pulse 67   Temp 97.9 F (36.6 C) (Oral)   Resp 18   SpO2 100%   Physical Exam Vitals and nursing note reviewed.  Constitutional:      General: She is not in acute distress.    Appearance: She is well-developed.  HENT:     Head: Normocephalic and atraumatic.  Eyes:     Conjunctiva/sclera: Conjunctivae normal.  Cardiovascular:     Rate and Rhythm: Normal rate and regular rhythm.     Heart sounds: No murmur heard.   Pulmonary:     Effort: Pulmonary effort is normal. No respiratory distress.     Breath sounds: Normal breath sounds.  Abdominal:     Palpations: Abdomen is soft.     Tenderness: There is no abdominal tenderness.  Musculoskeletal:     Cervical back: Neck supple.  Skin:    General: Skin is warm and dry.  Neurological:     General: No focal deficit present.     Mental Status: She is alert and oriented to person, place, and time.  Psychiatric:        Mood and Affect: Mood normal.        Behavior: Behavior normal.     ED Results / Procedures / Treatments   Labs (all labs ordered are listed, but only abnormal results are displayed) Labs Reviewed  BASIC METABOLIC PANEL  CBC  I-STAT BETA HCG BLOOD, ED ([facility], [facility], [facility] ONLY)  TROPONIN I (HIGH SENSITIVITY)    EKG None  Radiology No results found.  Procedures Procedures (including critical care time)  Medications Ordered in ED Medications - No data to display  ED Course  I have reviewed the triage vital signs and the nursing notes.  Pertinent labs & imaging results that were available during my care of the patient were reviewed by me and considered in my medical decision making (see chart for details).  This patient presents to the Emergency Department with complaint of chest pain. This involves an extensive  number of treatment options, and is a complaint that carries  with it a high risk of complications and morbidity.  The differential diagnosis includes ACS vs Pneumothorax vs PE vs Reflux/Gastritis vs MSK pain vs Pneumonia vs other.  She had Covid earlier this year, tested positive around April 2021 per PCP's note.  She has not been vaccinated since then.   I ordered imaging studies which included dg chest I independently visualized and interpreted imaging which showed no acute chest processes and the monitor tracing which showed NSR ECG per my interpretation showed NSR with no acute ischemic findings Previous records obtained and reviewed showing echo results from August 2019 . Pt signed out at 3 pm to Dr Alvino Chapel, pending labs and completion of workup.  Clinical Course as of Sep 09 1518  Fri Sep 08, 2020  1429 IMPRESSION: No active cardiopulmonary disease.   [MT]  1502 Signed out to Dr Alvino Chapel EDP, pending f/u labs and reassessment   [MT]    Clinical Course User Index [MT] Maguire Sime, Carola Rhine, MD   ED Diagnosis:  Chest pain  Rx / DC Orders ED Discharge Orders    None       Wyvonnia Dusky, MD 09/09/20 [phone]

## 2020-09-08 NOTE — ED Notes (Signed)
Delay on triage and EKG patient needed to go to the restroom.

## 2020-09-08 NOTE — Progress Notes (Signed)
TOC CM chart reviewed. Pt did not have insurance, but has a PCP. No dc needs identified. [place] [place] ED TOC CM [phone]

## 2020-09-08 NOTE — ED Triage Notes (Signed)
Pt presents with c/o chest pain since 11:30 this morning. Pt reports the pain is in the center of her chest and radiating around her left rib cage.

## 2020-09-14 NOTE — Progress Notes (Signed)
Chief Complaint  Patient presents with  . Hypotension    currently has shingles  . Medication Management    HPI: [name] 54 y.o. come in for  sda   Seen I ed 11 5  For  Ns chest pain that she was warned could be preshingles.  She then broke out in a rash 2 to 3 days ago and is very skin set of skin like shingles.  Has some blistering she has it covered up with the bandages to keep it from hurting.  From air hitting   She also wants to discuss blood pressure medication.  In the recent past she has had a extremely cold feeling with some shivering  neg covid x 2 testing when had cold  Feeling never developed fever per se or respiratory infection she has a history of positive Covid last November and in April. No significant asthma flare. Blood pressure tends to run low in the 112 100 and thought that her cold feeling was from the medication.  So she has stopped it intermittently at one point for a week blood pressure was 116/82 her cold felt a little better However in the emergency room and it increased. She is on valsartan 160 and HCTZ 25  denstist   Was 112/72   T 116/82   soon after stopping.  Cold feeling not as now.  No active bleeding fever new illnesses She is started a new job in the recent past closer to home doing well full-time.   ROS: See pertinent positives and negatives per HPI.  Past Medical History:  Diagnosis Date  . 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   . Allergy   . Anemia   . Asthma   . GERD (gastroesophageal reflux disease)   . Heart murmur   . Hypertension   . Positive TB test   . Shingles   . TOBACCO USE 03/22/2009   Qualifier: Diagnosis of  By: Regis Bill MD, [name]  Stopped Dec 12 2010      Family History  Problem Relation Age of Onset  . Hypertension Mother   . Alzheimer's disease Mother   . Hypertension Father   . Prostate cancer Father   . HIV Brother   . Lupus Sister   . Colon cancer Paternal Grandmother   .  Esophageal cancer Neg Hx   . Liver cancer Neg Hx   . Pancreatic cancer Neg Hx   . Rectal cancer Neg Hx   . Stomach cancer Neg Hx     Social History   Socioeconomic History  . Marital status: Divorced    Spouse name: Not on file  . Number of children: Not on file  . Years of education: Not on file  . Highest education level: Not on file  Occupational History  . Not on file  Tobacco Use  . Smoking status: Former Smoker    Packs/day: 0.50    Years: 17.00    Pack years: 8.50    Types: Cigarettes    Quit date: 12/12/2010    Years since quitting: 9.7  . Smokeless tobacco: Never Used  Vaping Use  . Vaping Use: Never used  Substance and Sexual Activity  . Alcohol use: No  . Drug use: No  . Sexual activity: Not on file  Other Topics Concern  . 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 Difficulty of Paying Living Expenses: Not on file  Food Insecurity:   . Worried About Charity fundraiser in the Last Year: Not on file  . Ran Out of Food in the Last Year: Not on file  Transportation Needs:   . Lack of Transportation (Medical): Not on file  . Lack of Transportation (Non-Medical): Not on file  Physical Activity:   . Days of Exercise per Week: Not on file  . Minutes of Exercise per Session: Not on file  Stress:   . Feeling of Stress : Not on file  Social Connections:   . Frequency of Communication with Friends and Family: Not on file  . Frequency of Social Gatherings with Friends and Family: Not on file  . Attends Religious Services: Not on file  . Active Member of Clubs or Organizations: Not on file  . Attends Archivist Meetings: Not on file  . Marital Status: Not on file    Outpatient Medications Prior to Visit  Medication Sig Dispense Refill  .  albuterol (PROVENTIL) (2.5 MG/3ML) 0.083% nebulizer solution Take 3 mLs (2.5 mg total) by nebulization every 6 (six) hours as needed for wheezing or shortness of breath. 125 mL 12  . albuterol (VENTOLIN HFA) 108 (90 Base) MCG/ACT inhaler INHALE TWO PUFFS BY MOUTH EVERY 4 HOURS AS NEEDED ONLY IF YOU CANT CATCH YOUR BREATH (Patient taking differently: Inhale 2 puffs into the lungs every 4 (four) hours as needed for wheezing or shortness of breath. ) 18 g 0  . budesonide-formoterol (SYMBICORT) 160-4.5 MCG/ACT inhaler Inhale 2 puffs into the lungs every 12 (twelve) hours. 1 Inhaler 0  . budesonide-formoterol (SYMBICORT) 80-4.5 MCG/ACT inhaler INHALE 2 PUFFS BY MOUTH INTO THE LUNGS TWICE DAILY 1 Inhaler 0  . EPINEPHRINE 0.3 mg/0.3 mL IJ SOAJ injection INJECT 1 SYRINGE INTO THE MUSCLE AS NEEDED FOR ANAPHYLAXIS (Patient taking differently: Inject 0.3 mg into the muscle as needed for anaphylaxis. ) 2 each 1  . famotidine (PEPCID) 20 MG tablet One at bedtime (Patient taking differently: Take 20 mg by mouth at bedtime. One at bedtime ) 60 tablet 11  . fluticasone (FLONASE) 50 MCG/ACT nasal spray USE 2 SPRAYS IN EACH NOSTRIL DAILY 48 g 12  . fluticasone furoate-vilanterol (BREO ELLIPTA) 100-25 MCG/INH AEPB Inhale 1 puff into the lungs daily. 2 each 0  . hydrochlorothiazide (HYDRODIURIL) 25 MG tablet TAKE ONE TABLET BY MOUTH ONCE DAILY 30 tablet 4  . ibuprofen (ADVIL) 200 MG tablet Take 400 mg by mouth every 6 (six) hours as needed for moderate pain.    . methocarbamol (ROBAXIN) 500 MG tablet Take 1 tablet (500 mg total) by mouth every 8 (eight) hours as needed for muscle spasms. 10 tablet 0  . Multiple Vitamins-Minerals (ZINC PO) Take 1 tablet by mouth daily.    . ondansetron (ZOFRAN-ODT) 4 MG disintegrating tablet Take 1 tablet (4 mg total) by mouth every 8 (eight) hours as needed for nausea or vomiting. 20 tablet 0  . pantoprazole (PROTONIX) 40 MG tablet TAKE ONE TABLET BY MOUTH TWICE DAILY BEFORE MEALS. TAKE  30 TO 60 MINUTES BEFORE FIRST MEAL OF THE DAY 60 tablet 0  . Spacer/Aero-Holding Dorise Bullion Use with HFA inhaler albuterol for asthma. 1 each 0  . valsartan (DIOVAN) 320 MG tablet TAKE 1/2 TABLET BY MOUTH ONCE DAILY 30  tablet 2  . VITAMIN D PO Take 1 capsule by mouth daily.    Marland Kitchen gabapentin (NEURONTIN) 100 MG capsule Take 2 capsules (200 mg total) by mouth at bedtime. (Patient not taking: Reported on 09/15/2020) 180 capsule 3   No facility-administered medications prior to visit.     EXAM:  BP (!) 158/90   Pulse 72   Temp 98.5 F (36.9 C) (Oral)   Ht 5' (1.524 m)   Wt 135 lb 9.6 oz (61.5 kg)   SpO2 99%   BMI 26.48 kg/m   Body mass index is 26.48 kg/m.  BP Readings from Last 3 Encounters:  09/15/20 (!) 158/90  09/08/20 (!) 143/92  02/21/20 113/80   Wt Readings from Last 3 Encounters:  09/15/20 135 lb 9.6 oz (61.5 kg)  09/08/20 132 lb (59.9 kg)  02/18/20 146 lb (66.2 kg)   Repeat bp 148/80 right   GENERAL: vitals reviewed and listed above, alert, oriented, appears well hydrated and in no acute distress HEENT: atraumatic, conjunctiva  clear, no obvious abnormalities on inspection of external nose and ears OP : masked NECK: no obvious masses on inspection palpation  LUNGS: clear to auscultation bilaterally, no wheezes, rales or rhonchi, good air movement CV: HRRR, no clubbing cyanosis or  peripheral edema nl cap refill  MS: moves all extremities without noticeable focal  Abnormality Skin  Left upper lateral chest with  Rash  Covered only lifted one patch  And was blotchy   No vesicle on upper part seh reports  vesibles on lower protion  PSYCH: pleasant and cooperative, no obvious depression or anxiety Lab Results  Component Value Date   WBC 4.7 09/08/2020   HGB 11.9 (L) 09/08/2020   HCT 37.3 09/08/2020   PLT 286 09/08/2020   GLUCOSE 87 09/08/2020   CHOL 195 05/19/2018   TRIG 41.0 05/19/2018   HDL 83.80 05/19/2018   LDLCALC 103 (H) 05/19/2018   ALT 14 12/11/2019    AST 21 12/11/2019   NA 141 09/08/2020   K 4.3 09/08/2020   CL 104 09/08/2020   CREATININE 1.01 (H) 09/08/2020   BUN 13 09/08/2020   CO2 27 09/08/2020   TSH 0.72 05/19/2018   MICROALBUR 0.7 05/30/2009   BP Readings from Last 3 Encounters:  09/15/20 (!) 158/90  09/08/20 (!) 143/92  02/21/20 113/80    ASSESSMENT AND PLAN:  Discussed the following assessment and plan:  Hypertension, unspecified type  Herpes zoster without complication - see text   Medication management  History of 2019 novel coronavirus disease (COVID-19)  Hypotension, unspecified hypotension type  cold feeling  - not below 100  110 range  for now  med adjustment and more data needed  A bit atypical in distribution  but scenario  is classic  For this( didn't  Take  all of the 4 patches off her skin)    bp control  Low could have caused cold     And hydration an issues at times   Prefer to get back on reg use and then check readings   Stay hydrated and if goes low then  We may decrease or adjust   Baseline dosing  Of meds  Lab reviewed from ed . consider shingles vaccine in future but can wait  sinde infection boost immunity somewhat   -Patient advised to return or notify health care team  if  new concerns arise.  Patient Instructions  I agree shingles    Begin valtrex as planned .  For  now stay on same dose of bp med  Next week   Take blood pressure readings twice a day for  5-7 days and then periodically . Marland KitchenSend in readings      Next week and we can go form there .   Stay hydrated .   If BP low we may adjust med downward instead of   Of going off and on med.      Shingles  Shingles, which is also known as herpes zoster, is an infection that causes a painful skin rash and fluid-filled blisters. It is caused by a virus. Shingles only develops in people who:  Have had chickenpox.  Have been given a medicine to protect against chickenpox (have been vaccinated). Shingles is rare in this group. What  are the causes? Shingles is caused by varicella-zoster virus (VZV). This is the same virus that causes chickenpox. After a person is exposed to VZV, the virus stays in the body in an inactive (dormant) state. Shingles develops if the virus is reactivated. This can happen many years after the first (initial) exposure to VZV. It is not known what causes this virus to be reactivated. What increases the risk? People who have had chickenpox or received the chickenpox vaccine are at risk for shingles. Shingles infection is more common in people who:  Are older than age 6.  Have a weakened disease-fighting system (immune system), such as people with: ? HIV. ? AIDS. ? Cancer.  Are taking medicines that weaken the immune system, such as transplant medicines.  Are experiencing a lot of stress. What are the signs or symptoms? Early symptoms of this condition include itching, tingling, and pain in an area on your skin. Pain may be described as burning, stabbing, or throbbing. A few days or weeks after early symptoms start, a painful red rash appears. The rash is usually on one side of the body and has a band-like or belt-like pattern. The rash eventually turns into fluid-filled blisters that break open, change into scabs, and dry up in about 2-3 weeks. At any time during the infection, you may also develop:  A fever.  Chills.  A headache.  An upset stomach. How is this diagnosed? This condition is diagnosed with a skin exam. Skin or fluid samples may be taken from the blisters before a diagnosis is made. These samples are examined under a microscope or sent to a lab for testing. How is this treated? The rash may last for several weeks. There is not a specific cure for this condition. Your health care provider will probably prescribe medicines to help you manage pain, recover more quickly, and avoid long-term problems. Medicines may include:  Antiviral drugs.  Anti-inflammatory drugs.  Pain  medicines.  Anti-itching medicines (antihistamines). If the area involved is on your face, you may be referred to a specialist, such as an eye doctor (ophthalmologist) or an ear, nose, and throat (ENT) doctor (otolaryngologist) to help you avoid eye problems, chronic pain, or disability. Follow these instructions at home: Medicines  Take over-the-counter and prescription medicines only as told by your health care provider.  Apply an anti-itch cream or numbing cream to the affected area as told by your health care provider. Relieving itching and discomfort   Apply cold, wet cloths (cold compresses) to the area of the rash or blisters as told by your health care provider.  Cool baths can be soothing. Try adding baking soda or dry oatmeal to the water to reduce itching. Do not  bathe in hot water. Blister and rash care  Keep your rash covered with a loose bandage (dressing). Wear loose-fitting clothing to help ease the pain of material rubbing against the rash.  Keep your rash and blisters clean by washing the area with mild soap and cool water as told by your health care provider.  Check your rash every day for signs of infection. Check for: ? More redness, swelling, or pain. ? Fluid or blood. ? Warmth. ? Pus or a bad smell.  Do not scratch your rash or pick at your blisters. To help avoid scratching: ? Keep your fingernails clean and cut short. ? Wear gloves or mittens while you sleep, if scratching is a problem. General instructions  Rest as told by your health care provider.  Keep all follow-up visits as told by your health care provider. This is important.  Wash your hands often with soap and water. If soap and water are not available, use hand sanitizer. Doing this lowers your chance of getting a bacterial skin infection.  Before your blisters change into scabs, your shingles infection can cause chickenpox in people who have never had it or have never been vaccinated against  it. To prevent this from happening, avoid contact with other people, especially: ? Babies. ? Pregnant women. ? Children who have eczema. ? Elderly people who have transplants. ? People who have chronic illnesses, such as cancer or AIDS. Contact a health care provider if:  Your pain is not relieved with prescribed medicines.  Your pain does not get better after the rash heals.  You have signs of infection in the rash area, such as: ? More redness, swelling, or pain around the rash. ? Fluid or blood coming from the rash. ? The rash area feeling warm to the touch. ? Pus or a bad smell coming from the rash. Get help right away if:  The rash is on your face or nose.  You have facial pain, pain around your eye area, or loss of feeling on one side of your face.  You have difficulty seeing.  You have ear pain or have ringing in your ear.  You have a loss of taste.  Your condition gets worse. Summary  Shingles, which is also known as herpes zoster, is an infection that causes a painful skin rash and fluid-filled blisters.  This condition is diagnosed with a skin exam. Skin or fluid samples may be taken from the blisters and examined before the diagnosis is made.  Keep your rash covered with a loose bandage (dressing). Wear loose-fitting clothing to help ease the pain of material rubbing against the rash.  Before your blisters change into scabs, your shingles infection can cause chickenpox in people who have never had it or have never been vaccinated against it. This information is not intended to replace advice given to you by your health care provider. Make sure you discuss any questions you have with your health care provider. Document Revised: 02/12/2019 Document Reviewed: 06/25/2017 Elsevier Patient Education  2020 Betances Daton Szilagyi M.D.

## 2020-09-15 ENCOUNTER — Encounter: Payer: Self-pay | Admitting: Internal Medicine

## 2020-09-15 ENCOUNTER — Ambulatory Visit (INDEPENDENT_AMBULATORY_CARE_PROVIDER_SITE_OTHER): Payer: Self-pay | Admitting: Internal Medicine

## 2020-09-15 ENCOUNTER — Other Ambulatory Visit: Payer: Self-pay

## 2020-09-15 VITALS — BP 158/90 | HR 72 | Temp 98.5°F | Ht 60.0 in | Wt 135.6 lb

## 2020-09-15 DIAGNOSIS — Z8616 Personal history of COVID-19: Secondary | ICD-10-CM

## 2020-09-15 DIAGNOSIS — Z79899 Other long term (current) drug therapy: Secondary | ICD-10-CM

## 2020-09-15 DIAGNOSIS — B029 Zoster without complications: Secondary | ICD-10-CM

## 2020-09-15 DIAGNOSIS — I10 Essential (primary) hypertension: Secondary | ICD-10-CM

## 2020-09-15 DIAGNOSIS — I959 Hypotension, unspecified: Secondary | ICD-10-CM

## 2020-09-15 MED ORDER — VALACYCLOVIR HCL 1 G PO TABS: 1000.0000 mg | ORAL_TABLET | Freq: Three times a day (TID) | ORAL | 0 refills | Status: DC

## 2020-09-15 NOTE — Patient Instructions (Addendum)
I agree shingles    Begin valtrex as planned .  For now stay on same dose of bp med  Next week   Take blood pressure readings twice a day for  5-7 days and then periodically . Marland KitchenSend in readings      Next week and we can go form there .   Stay hydrated .   If BP low we may adjust med downward instead of   Of going off and on med.      Shingles  Shingles, which is also known as herpes zoster, is an infection that causes a painful skin rash and fluid-filled blisters. It is caused by a virus. Shingles only develops in people who:  Have had chickenpox.  Have been given a medicine to protect against chickenpox (have been vaccinated). Shingles is rare in this group. What are the causes? Shingles is caused by varicella-zoster virus (VZV). This is the same virus that causes chickenpox. After a person is exposed to VZV, the virus stays in the body in an inactive (dormant) state. Shingles develops if the virus is reactivated. This can happen many years after the first (initial) exposure to VZV. It is not known what causes this virus to be reactivated. What increases the risk? People who have had chickenpox or received the chickenpox vaccine are at risk for shingles. Shingles infection is more common in people who:  Are older than age 18.  Have a weakened disease-fighting system (immune system), such as people with: ? HIV. ? AIDS. ? Cancer.  Are taking medicines that weaken the immune system, such as transplant medicines.  Are experiencing a lot of stress. What are the signs or symptoms? Early symptoms of this condition include itching, tingling, and pain in an area on your skin. Pain may be described as burning, stabbing, or throbbing. A few days or weeks after early symptoms start, a painful red rash appears. The rash is usually on one side of the body and has a band-like or belt-like pattern. The rash eventually turns into fluid-filled blisters that break open, change into scabs, and dry  up in about 2-3 weeks. At any time during the infection, you may also develop:  A fever.  Chills.  A headache.  An upset stomach. How is this diagnosed? This condition is diagnosed with a skin exam. Skin or fluid samples may be taken from the blisters before a diagnosis is made. These samples are examined under a microscope or sent to a lab for testing. How is this treated? The rash may last for several weeks. There is not a specific cure for this condition. Your health care provider will probably prescribe medicines to help you manage pain, recover more quickly, and avoid long-term problems. Medicines may include:  Antiviral drugs.  Anti-inflammatory drugs.  Pain medicines.  Anti-itching medicines (antihistamines). If the area involved is on your face, you may be referred to a specialist, such as an eye doctor (ophthalmologist) or an ear, nose, and throat (ENT) doctor (otolaryngologist) to help you avoid eye problems, chronic pain, or disability. Follow these instructions at home: Medicines  Take over-the-counter and prescription medicines only as told by your health care provider.  Apply an anti-itch cream or numbing cream to the affected area as told by your health care provider. Relieving itching and discomfort   Apply cold, wet cloths (cold compresses) to the area of the rash or blisters as told by your health care provider.  Cool baths can be soothing. Try adding baking  soda or dry oatmeal to the water to reduce itching. Do not bathe in hot water. Blister and rash care  Keep your rash covered with a loose bandage (dressing). Wear loose-fitting clothing to help ease the pain of material rubbing against the rash.  Keep your rash and blisters clean by washing the area with mild soap and cool water as told by your health care provider.  Check your rash every day for signs of infection. Check for: ? More redness, swelling, or pain. ? Fluid or blood. ? Warmth. ? Pus or a  bad smell.  Do not scratch your rash or pick at your blisters. To help avoid scratching: ? Keep your fingernails clean and cut short. ? Wear gloves or mittens while you sleep, if scratching is a problem. General instructions  Rest as told by your health care provider.  Keep all follow-up visits as told by your health care provider. This is important.  Wash your hands often with soap and water. If soap and water are not available, use hand sanitizer. Doing this lowers your chance of getting a bacterial skin infection.  Before your blisters change into scabs, your shingles infection can cause chickenpox in people who have never had it or have never been vaccinated against it. To prevent this from happening, avoid contact with other people, especially: ? Babies. ? Pregnant women. ? Children who have eczema. ? Elderly people who have transplants. ? People who have chronic illnesses, such as cancer or AIDS. Contact a health care provider if:  Your pain is not relieved with prescribed medicines.  Your pain does not get better after the rash heals.  You have signs of infection in the rash area, such as: ? More redness, swelling, or pain around the rash. ? Fluid or blood coming from the rash. ? The rash area feeling warm to the touch. ? Pus or a bad smell coming from the rash. Get help right away if:  The rash is on your face or nose.  You have facial pain, pain around your eye area, or loss of feeling on one side of your face.  You have difficulty seeing.  You have ear pain or have ringing in your ear.  You have a loss of taste.  Your condition gets worse. Summary  Shingles, which is also known as herpes zoster, is an infection that causes a painful skin rash and fluid-filled blisters.  This condition is diagnosed with a skin exam. Skin or fluid samples may be taken from the blisters and examined before the diagnosis is made.  Keep your rash covered with a loose bandage  (dressing). Wear loose-fitting clothing to help ease the pain of material rubbing against the rash.  Before your blisters change into scabs, your shingles infection can cause chickenpox in people who have never had it or have never been vaccinated against it. This information is not intended to replace advice given to you by your health care provider. Make sure you discuss any questions you have with your health care provider. Document Revised: 02/12/2019 Document Reviewed: 06/25/2017 Elsevier Patient Education  2020 [employer].

## 2020-11-30 ENCOUNTER — Other Ambulatory Visit: Payer: Self-pay | Admitting: Internal Medicine

## 2020-11-30 ENCOUNTER — Other Ambulatory Visit: Payer: Self-pay | Admitting: Gastroenterology

## 2020-12-12 NOTE — Telephone Encounter (Signed)
I do not have information to make  specific recommendations about the workplace accommodations and avoidance.  I can document that you have asthma allergies and history of allergic reactions based on previous history and your visits to the pulmonary specialist about asthmatic symptoms and their advice.  I think you should be evaluated by allergist to make specific recommendations for you.  And any advice about the workplace.  May we do a referral to allergy?

## 2021-01-09 ENCOUNTER — Ambulatory Visit (INDEPENDENT_AMBULATORY_CARE_PROVIDER_SITE_OTHER): Payer: Medicaid Other | Admitting: Internal Medicine

## 2021-01-09 ENCOUNTER — Encounter: Payer: Self-pay | Admitting: Internal Medicine

## 2021-01-09 ENCOUNTER — Other Ambulatory Visit: Payer: Self-pay

## 2021-01-09 VITALS — BP 128/84 | HR 62 | Temp 98.1°F | Ht 60.0 in | Wt 143.0 lb

## 2021-01-09 DIAGNOSIS — J45991 Cough variant asthma: Secondary | ICD-10-CM

## 2021-01-09 DIAGNOSIS — R519 Headache, unspecified: Secondary | ICD-10-CM

## 2021-01-09 DIAGNOSIS — H9201 Otalgia, right ear: Secondary | ICD-10-CM

## 2021-01-09 DIAGNOSIS — Z79899 Other long term (current) drug therapy: Secondary | ICD-10-CM

## 2021-01-09 DIAGNOSIS — Z8616 Personal history of COVID-19: Secondary | ICD-10-CM

## 2021-01-09 MED ORDER — OFLOXACIN 0.3 % OT SOLN: 10.0000 [drp] | Freq: Every day | OTIC | 0 refills | Status: DC

## 2021-01-09 MED ORDER — VALACYCLOVIR HCL 1 G PO TABS: 1000.0000 mg | ORAL_TABLET | Freq: Three times a day (TID) | ORAL | 0 refills | Status: DC

## 2021-01-09 MED ORDER — BUDESONIDE-FORMOTEROL FUMARATE 80-4.5 MCG/ACT IN AERO: INHALATION_SPRAY | RESPIRATORY_TRACT | 5 refills | Status: DC

## 2021-01-09 MED ORDER — ALBUTEROL SULFATE HFA 108 (90 BASE) MCG/ACT IN AERS
2.0000 | INHALATION_SPRAY | RESPIRATORY_TRACT | 2 refills | Status: DC | PRN
Start: 2021-01-09 — End: 2021-06-01

## 2021-01-09 NOTE — Patient Instructions (Signed)
Ear drops for  Redness of external ear canal.   Valtrex if needed for rash.   Like shingles .  Will  Work on the form for covid vaccine .  Because of your hs of allergy reactions  And ppast hx of documented covid 19  Infection x 2

## 2021-01-09 NOTE — Progress Notes (Signed)
Onset of symptoms last Thursday. Feels like the right inner ear is swollen. Sunday night her face started to hurt, right around the cheek bones.   History of allergies but no sinus issues.   Itchy throat with no soreness. Feels like she constantly has to clear her throat.

## 2021-01-09 NOTE — Progress Notes (Signed)
Chief Complaint  Patient presents with  . Ear Problem  . throat    Itchy throat    HPI: [name] 55 y.o. come in for new problem also refill inhalers previously given by Dr. Melvyn Novas and question if we would be able to fill out a form for immunization Covid exclusion because of her allergy reactions.  Problem #1 onset about 48 hours ago of right cheekbone tenderness and then right ear pain in the ear canal sensitivity to temperature change hot cold wind where she put a ear plug in to keep from it being sensitive no rash fever congestion or cough but she did have a very itchy throat at the onset.  No history of similar problem Problem #2 is on Symbicort did not do well on the powdered inhalers.  Is taking twice a day needs refill on albuterol does not use it very often but needs a refill.  Problem #3 requests medical documentation of exclusion from Covid vaccine that is needed to be able to get the work insurance. She is very concerned because of her multiple allergies and history of anaphylactic type symptoms that is taken her to the ER under other circumstances.  She also has had COVID-19 infection documented twice first November 2020 diagnosed [facility] test [facility] mild symptoms. Then again April 24 + rapid test [facility] where she was much sicker and see notes in record about her symptoms and recovery. Has seen allergist in the past positive a number of allergens on skin test I do not have the results, has an EpiPen has not seen an allergist in regard to Covid vaccine concerns.  Her last flu shot was a number of years ago and she was "sick for almost 2 weeks" had flulike symptoms with body aches low-grade fever and fatigue. ROS: See pertinent positives and negatives per HPI.  No current fever chest pain shortness of breath  Past Medical History:  Diagnosis Date  . 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   . Allergy   . Anemia   . Asthma   . GERD (gastroesophageal  reflux disease)   . Heart murmur   . Hypertension   . Positive TB test   . Shingles   . TOBACCO USE 03/22/2009   Qualifier: Diagnosis of  By: Regis Bill MD, [name]  Stopped Dec 12 2010      Family History  Problem Relation Age of Onset  . Hypertension Mother   . Alzheimer's disease Mother   . Hypertension Father   . Prostate cancer Father   . HIV Brother   . Lupus Sister   . Colon cancer Paternal Grandmother   . Esophageal cancer Neg Hx   . Liver cancer Neg Hx   . Pancreatic cancer Neg Hx   . Rectal cancer Neg Hx   . Stomach cancer Neg Hx     Social History   Socioeconomic History  . Marital status: Divorced    Spouse name: Not on file  . Number of children: Not on file  . Years of education: Not on file  . Highest education level: Not on file  Occupational History  . Not on file  Tobacco Use  . Smoking status: Former Smoker    Packs/day: 0.50    Years: 17.00    Pack years: 8.50    Types: Cigarettes    Quit date: 12/12/2010    Years since quitting: 10.0  . Smokeless tobacco: Never Used  Vaping Use  . Vaping Use: Never used  Substance and Sexual Activity  . Alcohol use: No  . Drug use: No  . Sexual activity: Not on file  Other Topics Concern  . 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 EPINEPHRINE 0.3 mg/0.3 mL IJ SOAJ injection INJECT 1 SYRINGE INTO THE MUSCLE AS NEEDED FOR ANAPHYLAXIS (Patient taking differently: Inject 0.3 mg into the muscle as needed for anaphylaxis.) 2 each 1  . famotidine (PEPCID) 20 MG tablet One  at bedtime (Patient taking differently: Take 20 mg by mouth at bedtime. One at bedtime) 60 tablet 11  . fluticasone (FLONASE) 50 MCG/ACT nasal spray USE 2 SPRAYS IN EACH NOSTRIL DAILY 48 g 12  . hydrochlorothiazide (HYDRODIURIL) 25 MG tablet TAKE ONE TABLET BY MOUTH ONCE DAILY 30 tablet 4  . ibuprofen (ADVIL) 200 MG tablet Take 400 mg by mouth every 6 (six) hours as needed for moderate pain.    . Multiple Vitamins-Minerals (ZINC PO) Take 1 tablet by mouth daily.    . valsartan (DIOVAN) 320 MG tablet TAKE 1/2 TABLET BY MOUTH ONCE DAILY 30 tablet 2  . VITAMIN D PO Take 1 capsule by mouth daily.    Marland Kitchen albuterol (VENTOLIN HFA) 108 (90 Base) MCG/ACT inhaler INHALE TWO PUFFS BY MOUTH EVERY 4 HOURS AS NEEDED ONLY IF YOU CANT CATCH YOUR BREATH (Patient taking differently: Inhale 2 puffs into the lungs every 4 (four) hours as needed for wheezing or shortness of breath.) 18 g 0  . budesonide-formoterol (SYMBICORT) 80-4.5 MCG/ACT inhaler INHALE 2 PUFFS BY MOUTH INTO THE LUNGS TWICE DAILY 1 Inhaler 0  . albuterol (PROVENTIL) (2.5 MG/3ML) 0.083% nebulizer solution Take 3 mLs (2.5 mg total) by nebulization every 6 (six) hours as needed for wheezing or shortness of breath. (Patient not taking: Reported on 01/09/2021) 125 mL 12  . budesonide-formoterol (SYMBICORT) 160-4.5 MCG/ACT inhaler Inhale 2 puffs into the lungs every 12 (twelve) hours. (Patient not taking: Reported on 01/09/2021) 1 Inhaler 0  . fluticasone furoate-vilanterol (BREO ELLIPTA) 100-25 MCG/INH AEPB Inhale 1 puff into the lungs daily. (Patient not taking: Reported on 01/09/2021) 2 each 0  . gabapentin (NEURONTIN) 100 MG capsule Take 2 capsules (200 mg total) by mouth at bedtime. (Patient not taking: No sig reported) 180 capsule 3  . methocarbamol (ROBAXIN) 500 MG tablet Take 1 tablet (500 mg total) by mouth every 8 (eight) hours as needed for muscle spasms. (Patient not taking: Reported on 01/09/2021) 10 tablet 0  . ondansetron (ZOFRAN-ODT) 4 MG disintegrating  tablet Take 1 tablet (4 mg total) by mouth every 8 (eight) hours as needed for nausea or vomiting. (Patient not taking: Reported on 01/09/2021) 20 tablet 0  . pantoprazole (PROTONIX) 40 MG tablet TAKE ONE TABLET BY MOUTH TWICE DAILY BEFORE MEALS. TAKE 30 TO 60 MINUTES BEFORE FIRST MEAL OF THE DAY (Patient not taking: Reported on 01/09/2021) 60 tablet 0  . Spacer/Aero-Holding Dorise Bullion Use with HFA inhaler albuterol for asthma. (Patient not taking:  Reported on 01/09/2021) 1 each 0  . valACYclovir (VALTREX) 1000 MG tablet Take 1 tablet (1,000 mg total) by mouth 3 (three) times daily. For shingles (Patient not taking: Reported on 01/09/2021) 21 tablet 0   No facility-administered medications prior to visit.     EXAM:  BP 128/84 (BP Location: Left Arm, Patient Position: Sitting, Cuff Size: Normal)   Pulse 62   Temp 98.1 F (36.7 C) (Oral)   Ht 5' (1.524 m)   Wt 143 lb (64.9 kg)   SpO2 98%   BMI 27.93 kg/m   Body mass index is 27.93 kg/m.  GENERAL: vitals reviewed and listed above, alert, oriented, appears well hydrated and in no acute distress HEENT: atraumatic, conjunctiva  clear, no obvious abnormalities on inspection of external nose and ears OP : no lesion edema or exudate  NECK: no obvious masses on inspection palpation  LUNGS: clear to auscultation bilaterally, no wheezes, rales or rhonchi, good air movement CV: HRRR, no clubbing cyanosis or  peripheral edema nl cap refill  MS: moves all extremities without noticeable focal  abnormality PSYCH: pleasant and cooperative, no obvious depression or anxiety Lab Results  Component Value Date   WBC 4.7 09/08/2020   HGB 11.9 (L) 09/08/2020   HCT 37.3 09/08/2020   PLT 286 09/08/2020   GLUCOSE 87 09/08/2020   CHOL 195 05/19/2018   TRIG 41.0 05/19/2018   HDL 83.80 05/19/2018   LDLCALC 103 (H) 05/19/2018   ALT 14 12/11/2019   AST 21 12/11/2019   NA 141 09/08/2020   K 4.3 09/08/2020   CL 104 09/08/2020   CREATININE 1.01 (H) 09/08/2020    BUN 13 09/08/2020   CO2 27 09/08/2020   TSH 0.72 05/19/2018   MICROALBUR 0.7 05/30/2009   BP Readings from Last 3 Encounters:  01/09/21 128/84  09/15/20 (!) 158/90  09/08/20 (!) 143/92    ASSESSMENT AND PLAN:  Discussed the following assessment and plan:  Ear pain, right  Right-sided face pain tenderness cheek bone  History of 2019 novel coronavirus disease (COVID-19) - Times 06 September 2019 April 2021  Medication management  Cough variant asthma Uncertain cause but could have external ear inflammation possibly early the oh look out for shingles or herpetic outbreak based on the type of pain and radiation.  TMs look okay Can use the eardrops add Valtrex if breaks out in a rash.  Would use earmuffs type cover instead of an ear plug that can irritate the canal. Refill her Symbicort and albuterol seems to be stable at this time Will Complete form  With her history of anaphylaxis with other medications and her past history of Covid infection recovered I think the risk of the vaccine may be higher than the risk of the disease.as she should have significant amount of immunity at this time.  State of the scientific knowledge may change over the next 6 to 12 months and can always reassess. -Patient advised to return or notify health care team  if  new concerns arise.  Patient Instructions  Ear drops for  Redness of external ear canal.   Valtrex if needed for rash.   Like shingles .  Will  Work on the form for covid vaccine .  Because of your hs of allergy reactions  And ppast hx of documented covid 19  Infection x 2    Maricruz Lucero K. Meldon Hanzlik M.D.

## 2021-01-15 ENCOUNTER — Telehealth: Payer: Self-pay | Admitting: Internal Medicine

## 2021-01-15 NOTE — Telephone Encounter (Signed)
Patient called to see if her form for covid vaccine is completed.  Please advise

## 2021-01-16 ENCOUNTER — Telehealth: Payer: Self-pay | Admitting: Internal Medicine

## 2021-01-16 NOTE — Telephone Encounter (Signed)
Form  Completed and signed

## 2021-01-16 NOTE — Telephone Encounter (Signed)
Noted  

## 2021-01-16 NOTE — Telephone Encounter (Signed)
Will be ready by end of day .

## 2021-01-16 NOTE — Telephone Encounter (Signed)
Pt call and stated she have a appt tomorrow and want to pick -up her form that she drop off on 01/09/21.

## 2021-01-17 ENCOUNTER — Encounter: Payer: Self-pay | Admitting: Internal Medicine

## 2021-01-17 ENCOUNTER — Other Ambulatory Visit: Payer: Self-pay

## 2021-01-17 ENCOUNTER — Ambulatory Visit (INDEPENDENT_AMBULATORY_CARE_PROVIDER_SITE_OTHER): Payer: Self-pay | Admitting: Internal Medicine

## 2021-01-17 VITALS — BP 130/86 | HR 66 | Temp 98.0°F | Ht 60.0 in | Wt 145.6 lb

## 2021-01-17 DIAGNOSIS — H9201 Otalgia, right ear: Secondary | ICD-10-CM

## 2021-01-17 DIAGNOSIS — R238 Other skin changes: Secondary | ICD-10-CM

## 2021-01-17 DIAGNOSIS — M792 Neuralgia and neuritis, unspecified: Secondary | ICD-10-CM

## 2021-01-17 DIAGNOSIS — R233 Spontaneous ecchymoses: Secondary | ICD-10-CM

## 2021-01-17 NOTE — Telephone Encounter (Signed)
Give to paitent at visit today

## 2021-01-17 NOTE — Progress Notes (Signed)
Chief Complaint  Patient presents with  . Bleeding/Bruising    Patient states she has a bruise on the medial aspect of her right leg, x2 days, Patient states that it is tender to touch.     HPI: [name] 55 y.o. come in for new problem she noticed that she had a bruise after getting out of the shower this week on the right medial shin area about 4 cm mildly tender no break in the skin does not remember trauma.  She also relates she has had 2 other bruises in the last month one on the arm and one on the other leg that have since resolved.  She denies trauma However she has been taking a low-dose aspirin daily since she had COVID.  No other bleeding. No fevers. Right ear pain is changed may be slightly better never broke out in a rash ear is sensitive to wind and temperature change so she uses her earmuffs at work and there is an area below her right ear that is still tender.  But no fever. Also here to pick up her form. ROS: See pertinent positives and negatives per HPI.  Past Medical History:  Diagnosis Date  . 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   . Allergy   . Anemia   . Asthma   . GERD (gastroesophageal reflux disease)   . Heart murmur   . Hypertension   . Positive TB test   . Shingles   . TOBACCO USE 03/22/2009   Qualifier: Diagnosis of  By: Regis Bill MD, [name]  Stopped Dec 12 2010      Family History  Problem Relation Age of Onset  . Hypertension Mother   . Alzheimer's disease Mother   . Hypertension Father   . Prostate cancer Father   . HIV Brother   . Lupus Sister   . Colon cancer Paternal Grandmother   . Esophageal cancer Neg Hx   . Liver cancer Neg Hx   . Pancreatic cancer Neg Hx   . Rectal cancer Neg Hx   . Stomach cancer Neg Hx     Social History   Socioeconomic History  . Marital status: Divorced    Spouse name: Not on file  . Number of children: Not on file  . Years of education: Not on file  . Highest education level: Not  on file  Occupational History  . Not on file  Tobacco Use  . Smoking status: Former Smoker    Packs/day: 0.50    Years: 17.00    Pack years: 8.50    Types: Cigarettes    Quit date: 12/12/2010    Years since quitting: 10.1  . Smokeless tobacco: Never Used  Vaping Use  . Vaping Use: Never used  Substance and Sexual Activity  . Alcohol use: No  . Drug use: No  . Sexual activity: Not on file  Other Topics Concern  . 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 albuterol (PROVENTIL) (2.5 MG/3ML) 0.083% nebulizer solution Take 3 mLs (2.5 mg total) by nebulization every 6 (six) hours as needed for wheezing or shortness of breath. 125 mL 12  . albuterol (VENTOLIN HFA) 108 (90 Base) MCG/ACT inhaler Inhale 2 puffs into the lungs every 4 (four) hours as needed for wheezing or shortness of breath. 1 each 2  . budesonide-formoterol (SYMBICORT) 80-4.5 MCG/ACT inhaler INHALE 2 PUFFS BY MOUTH INTO THE LUNGS TWICE DAILY 1 each 5  . EPINEPHRINE 0.3 mg/0.3 mL IJ SOAJ injection INJECT 1 SYRINGE INTO THE MUSCLE AS NEEDED FOR ANAPHYLAXIS (Patient taking differently: Inject 0.3 mg into the muscle as needed for anaphylaxis.) 2 each 1  . famotidine (PEPCID) 20 MG tablet One at bedtime (Patient taking differently: Take 20 mg by mouth at bedtime. One at bedtime) 60 tablet 11  . fluticasone (FLONASE) 50 MCG/ACT nasal spray USE 2 SPRAYS IN EACH NOSTRIL DAILY 48 g 12  . hydrochlorothiazide (HYDRODIURIL) 25 MG tablet TAKE ONE TABLET BY MOUTH ONCE DAILY 30 tablet 4  .  ibuprofen (ADVIL) 200 MG tablet Take 400 mg by mouth every 6 (six) hours as needed for moderate pain.    . methocarbamol (ROBAXIN) 500 MG tablet Take 1 tablet (500 mg total) by mouth every 8 (eight) hours as needed for muscle spasms. 10 tablet 0  . Multiple Vitamins-Minerals (ZINC PO) Take 1 tablet by mouth daily.    Marland Kitchen ofloxacin (FLOXIN) 0.3 % OTIC solution Place 10 drops into the right ear daily. 10 mL 0  . pantoprazole (PROTONIX) 40 MG tablet TAKE ONE TABLET BY MOUTH TWICE DAILY BEFORE MEALS. TAKE 30 TO 60 MINUTES BEFORE FIRST MEAL OF THE DAY 60 tablet 0  . valACYclovir (VALTREX) 1000 MG tablet Take 1 tablet (1,000 mg total) by mouth 3 (three) times daily. For shingles 21 tablet 0  . valsartan (DIOVAN) 320 MG tablet TAKE 1/2 TABLET BY MOUTH ONCE DAILY (Patient taking differently: 160 mg.) 30 tablet 2  . VITAMIN D PO Take 1 capsule by mouth daily.    . budesonide-formoterol (SYMBICORT) 160-4.5 MCG/ACT inhaler Inhale 2 puffs into the lungs every 12 (twelve) hours. (Patient not taking: Reported on 01/09/2021) 1 Inhaler 0  . Spacer/Aero-Holding Dorise Bullion Use with HFA inhaler albuterol for asthma. (Patient not taking: Reported on 01/09/2021) 1 each 0   No facility-administered medications prior to visit.     EXAM:  BP 130/86 (BP Location: Left Arm, Patient Position: Sitting, Cuff Size: Normal)   Pulse 66   Temp 98 F (36.7 C) (Oral)   Ht 5' (1.524 m)   Wt 145 lb 9.6 oz (66 kg)   SpO2 99%   BMI 28.44 kg/m   Body mass index is 28.44 kg/m.  GENERAL: vitals reviewed and listed above, alert, oriented, appears well hydrated and in no acute distress HEENT: atraumatic, conjunctiva  clear, no obvious abnormalities on inspection of external nose and ears has some tenderness to light touch in the right infra-auricular area no obvious local mass no rashes.  OP : Masked NECK: no obvious masses on inspection palpation  LUNGS: clear to auscultation bilaterally, no wheezes, rales or rhonchi, good air  movement CV: HRRR, no clubbing cyanosis or  peripheral edema nl cap refill abdomen soft without organomegaly guarding or rebound no adenopathy noted. Skin there is a 4 cm bruise right medial tibial area without mass-effect or break in the skin mildly tender rest of skin shows no other bruising or petechiae.  Normal skin color.  MS: moves all extremities without noticeable focal  abnormality PSYCH: pleasant and cooperative, no obvious depression or anxiety  BP Readings from Last 3 Encounters:  01/17/21 130/86  01/09/21 128/84  09/15/20 (!) 158/90    ASSESSMENT AND PLAN:  Discussed the following assessment and plan:  Easy bruising - Plan: CBC with Differential/Platelet, Protime-INR, Comprehensive metabolic panel, Sedimentation rate, C-reactive protein, C-reactive protein, Sedimentation rate, Comprehensive metabolic panel, Protime-INR, CBC with Differential/Platelet, APTT, APTT, CANCELED: APTT, CANCELED: APTT  Ear pain, right - Plan: CBC with Differential/Platelet, Protime-INR, Comprehensive metabolic panel, Sedimentation rate, C-reactive protein, C-reactive protein, Sedimentation rate, Comprehensive metabolic panel, Protime-INR, CBC with Differential/Platelet, APTT, APTT, CANCELED: APTT, CANCELED: APTT  Neuralgia - Plan: CBC with Differential/Platelet, Protime-INR, Comprehensive metabolic panel, Sedimentation rate, C-reactive protein, C-reactive protein, Sedimentation rate, Comprehensive metabolic panel, Protime-INR, CBC with Differential/Platelet, APTT, APTT, CANCELED: APTT, CANCELED: APTT Suspect the bruising is from her low-dose aspirin in addition the location of the bruises described or not alarming and the rest of her exam is reassuring. Still not sure what to make of the right ear pain but still acts like a neuralgia sensitivity but is changed not worse hopefully will resolve.  Lab work today Completed form given copy made for the record. She could consider stopping the aspirin I am not  sure it is of benefit for her at this time. -Patient advised to return or notify health care team  if  new concerns arise.  Patient Instructions  Suspecting that the bruising is triggered by the low-dose aspirin.  No evidence of abnormalities on your exam that would be worrisome today.  We will do screening lab work for reassurance If getting frequent bruising in the truncal area are very unusual places then get back with [place]. The ear pain still seems like a neuritis.  Checking inflammation markers when we do the blood test.     [name]. Jonetta Dagley M.D.

## 2021-01-17 NOTE — Patient Instructions (Addendum)
Suspecting that the bruising is triggered by the low-dose aspirin.  No evidence of abnormalities on your exam that would be worrisome today.  We will do screening lab work for reassurance If getting frequent bruising in the truncal area are very unusual places then get back with [place]. The ear pain still seems like a neuritis.  Checking inflammation markers when we do the blood test.

## 2021-01-18 LAB — CBC WITH DIFFERENTIAL/PLATELET
Basophils Absolute: 0.1 10*3/uL (ref 0.0–0.1)
Basophils Relative: 1.4 % (ref 0.0–3.0)
Eosinophils Absolute: 0.4 10*3/uL (ref 0.0–0.7)
Eosinophils Relative: 7 % — ABNORMAL HIGH (ref 0.0–5.0)
HCT: 36.8 % (ref 36.0–46.0)
Hemoglobin: 11.9 g/dL — ABNORMAL LOW (ref 12.0–15.0)
Lymphocytes Relative: 46 % (ref 12.0–46.0)
Lymphs Abs: 2.6 10*3/uL (ref 0.7–4.0)
MCHC: 32.3 g/dL (ref 30.0–36.0)
MCV: 84.3 fl (ref 78.0–100.0)
Monocytes Absolute: 0.4 10*3/uL (ref 0.1–1.0)
Monocytes Relative: 6.7 % (ref 3.0–12.0)
Neutro Abs: 2.2 10*3/uL (ref 1.4–7.7)
Neutrophils Relative %: 38.9 % — ABNORMAL LOW (ref 43.0–77.0)
Platelets: 312 10*3/uL (ref 150.0–400.0)
RBC: 4.37 Mil/uL (ref 3.87–5.11)
RDW: 16.1 % — ABNORMAL HIGH (ref 11.5–15.5)
WBC: 5.6 10*3/uL (ref 4.0–10.5)

## 2021-01-18 LAB — COMPREHENSIVE METABOLIC PANEL
ALT: 11 U/L (ref 0–35)
AST: 16 U/L (ref 0–37)
Albumin: 4 g/dL (ref 3.5–5.2)
Alkaline Phosphatase: 55 U/L (ref 39–117)
BUN: 16 mg/dL (ref 6–23)
CO2: 30 mEq/L (ref 19–32)
Calcium: 9.2 mg/dL (ref 8.4–10.5)
Chloride: 104 mEq/L (ref 96–112)
Creatinine, Ser: 1.02 mg/dL (ref 0.40–1.20)
GFR: 62.59 mL/min (ref >60.00)
Glucose, Bld: 78 mg/dL (ref 70–99)
Potassium: 3.8 mEq/L (ref 3.5–5.1)
Sodium: 141 mEq/L (ref 135–145)
Total Bilirubin: 0.3 mg/dL (ref 0.2–1.2)
Total Protein: 6.7 g/dL (ref 6.0–8.3)

## 2021-01-18 LAB — APTT: aPTT: 28 s (ref 23–32)

## 2021-01-18 LAB — C-REACTIVE PROTEIN: CRP: <1 mg/dL (ref 0.5–20.0)

## 2021-01-18 LAB — SEDIMENTATION RATE: Sed Rate: 4 mm/hr (ref 0–30)

## 2021-01-18 LAB — PROTIME-INR
INR: 1 ratio (ref 0.8–1.0)
Prothrombin Time: 10.7 s (ref 9.6–13.1)

## 2021-01-18 NOTE — Progress Notes (Signed)
Clotting studies and platelet count are normal .   Mild borderline anemia  stable   stop the aspirin  Inflammation markers are normal . So overall  reassuring blood work . And no evidecne of  serious blood disorder   Fu if  progressive  problems

## 2021-02-09 NOTE — Progress Notes (Signed)
Could try  taking iron  supplement if tolerating .   We can recheck  you blood count  and iron studies  at next  physical

## 2021-02-16 ENCOUNTER — Other Ambulatory Visit: Payer: Self-pay | Admitting: Gastroenterology

## 2021-02-16 ENCOUNTER — Other Ambulatory Visit: Payer: Self-pay | Admitting: Internal Medicine

## 2021-02-19 NOTE — Telephone Encounter (Signed)
So you would have to be more specific with more information . Please get more information   there are antibody studies to check for immunity which could be from natural infection or immunization. If you went to college in the state or school they may have a copy.

## 2021-03-16 ENCOUNTER — Telehealth: Payer: Self-pay | Admitting: Internal Medicine

## 2021-03-16 NOTE — Telephone Encounter (Signed)
Pt is calling in stating that her insurance will not honor her albuterol (PROVENTIL) 2.5 MG/3ML and would like to see if Dr. Regis Bill would let her insurance company know that the medication is medically necessary so that they [employer]) will pay for it at a 100%.  Pt does not want try (alternative medication) would like to keep what she has been getting.

## 2021-03-27 ENCOUNTER — Encounter (HOSPITAL_COMMUNITY): Payer: Self-pay

## 2021-03-27 ENCOUNTER — Other Ambulatory Visit: Payer: Self-pay

## 2021-03-27 ENCOUNTER — Ambulatory Visit (HOSPITAL_COMMUNITY)
Admission: EM | Admit: 2021-03-27 | Discharge: 2021-03-27 | Disposition: A | Discharge status: Home / Self Care | Payer: BC Managed Care – PPO | Attending: Family Medicine | Admitting: Family Medicine

## 2021-03-27 ENCOUNTER — Telehealth (INDEPENDENT_AMBULATORY_CARE_PROVIDER_SITE_OTHER): Payer: BC Managed Care – PPO | Admitting: Family Medicine

## 2021-03-27 DIAGNOSIS — U071 COVID-19: Secondary | ICD-10-CM | POA: Diagnosis not present

## 2021-03-27 DIAGNOSIS — B349 Viral infection, unspecified: Secondary | ICD-10-CM | POA: Diagnosis not present

## 2021-03-27 DIAGNOSIS — R6889 Other general symptoms and signs: Secondary | ICD-10-CM | POA: Diagnosis not present

## 2021-03-27 DIAGNOSIS — Z87891 Personal history of nicotine dependence: Secondary | ICD-10-CM | POA: Insufficient documentation

## 2021-03-27 DIAGNOSIS — J45909 Unspecified asthma, uncomplicated: Secondary | ICD-10-CM | POA: Diagnosis not present

## 2021-03-27 DIAGNOSIS — Z7951 Long term (current) use of inhaled steroids: Secondary | ICD-10-CM | POA: Insufficient documentation

## 2021-03-27 DIAGNOSIS — R509 Fever, unspecified: Secondary | ICD-10-CM | POA: Diagnosis not present

## 2021-03-27 DIAGNOSIS — Z79899 Other long term (current) drug therapy: Secondary | ICD-10-CM | POA: Insufficient documentation

## 2021-03-27 DIAGNOSIS — Z888 Allergy status to other drugs, medicaments and biological substances status: Secondary | ICD-10-CM | POA: Insufficient documentation

## 2021-03-27 MED ORDER — PROMETHAZINE-DM 6.25-15 MG/5ML PO SYRP: 5.0000 mL | ORAL_SOLUTION | Freq: Three times a day (TID) | ORAL | 0 refills | Status: DC | PRN

## 2021-03-27 MED ORDER — ONDANSETRON 4 MG PO TBDP
ORAL_TABLET | ORAL | Status: AC
  Filled 2021-03-27: qty 1

## 2021-03-27 MED ORDER — ONDANSETRON 4 MG PO TBDP
4.0000 mg | ORAL_TABLET | Freq: Once | ORAL | Status: AC
  Administered 2021-03-27: 4 mg via ORAL

## 2021-03-27 MED ORDER — ONDANSETRON 4 MG PO TBDP: 4.0000 mg | ORAL_TABLET | Freq: Three times a day (TID) | ORAL | 0 refills | Status: DC | PRN

## 2021-03-27 MED ORDER — ACETAMINOPHEN 325 MG PO TABS
650.0000 mg | ORAL_TABLET | Freq: Once | ORAL | Status: AC
  Administered 2021-03-27: 650 mg via ORAL

## 2021-03-27 MED ORDER — ACETAMINOPHEN 325 MG PO TABS
ORAL_TABLET | ORAL | Status: AC
  Filled 2021-03-27: qty 2

## 2021-03-27 NOTE — ED Triage Notes (Signed)
Pt presents with non productive cough, generalized body aches, chills, fever, vomiting, and headache X 2 days.

## 2021-03-27 NOTE — ED Provider Notes (Signed)
[place]    CSN: [id number] Arrival date & time: 03/27/21  1712      History   Chief Complaint Chief Complaint  Patient presents with  . Chills  . Generalized Body Aches  . Cough  . Emesis  . Headache  . Fever    HPI [name] is a 55 y.o. female.   HPI  Patient presents today with 2 days of nonproductive cough, generalized body aches, chills, fever, vomiting and headache. Patient is unaware of any known sick contacts.  She has had some associated fatigue.  She has a history of asthma and has noticed increased use of her albuterol inhaler and has since resumed use of her nebulizer treatments which has improved symptoms.  Febrile on arrival and given tylenol on arrival   Past Medical History:  Diagnosis Date  . 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   . Allergy   . Anemia   . Asthma   . GERD (gastroesophageal reflux disease)   . Heart murmur   . Hypertension   . Positive TB test   . Shingles   . TOBACCO USE 03/22/2009   Qualifier: Diagnosis of  By: Regis Bill MD, [name]  Stopped Dec 12 2010      Patient Active Problem List   Diagnosis Date Noted  . Right hip pain 08/13/2019  . Greater trochanteric bursitis of both hips 07/01/2019  . Headache 07/02/2013  . Cough variant asthma 09/29/2009  . Essential hypertension 05/30/2009  . GERD 05/12/2008  . HOT FLASHES 05/12/2008  . 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 ALLERGIC RHINITIS 08/07/2007    Past Surgical History:  Procedure Laterality Date  . ABDOMINAL HYSTERECTOMY    . BREAST SURGERY     cyst from left breast  . BUNIONECTOMY     both feet  . CHOLECYSTECTOMY    . CYSTECTOMY     left shoulder  . KNEE SURGERY Left   . PANENDOSCOPY    . TUBAL LIGATION    . uterine ablastion     and polyps removed  . Uterine polyps      OB History   No obstetric history on file.      Home Medications    Prior to Admission medications   Medication Sig Start Date End Date Taking?  Authorizing Provider  albuterol (PROVENTIL) (2.5 MG/3ML) 0.083% nebulizer solution Take 3 mLs (2.5 mg total) by nebulization every 6 (six) hours as needed for wheezing or shortness of breath. 04/27/19   [name], [name], PA-C  albuterol (VENTOLIN HFA) 108 (90 Base) MCG/ACT inhaler Inhale 2 puffs into the lungs every 4 (four) hours as needed for wheezing or shortness of breath. 01/09/21   [name], [name], MD  budesonide-formoterol (SYMBICORT) 80-4.5 MCG/ACT inhaler INHALE 2 PUFFS BY MOUTH INTO THE LUNGS TWICE DAILY 01/09/21   [name], [name], MD  EPINEPHRINE 0.3 mg/0.3 mL IJ SOAJ injection INJECT 1 SYRINGE INTO THE MUSCLE AS NEEDED FOR ANAPHYLAXIS Patient taking differently: Inject 0.3 mg into the muscle as needed for anaphylaxis. 08/19/19   [name], [name], MD  famotidine (PEPCID) 20 MG tablet One at bedtime Patient taking differently: Take 20 mg by mouth at bedtime. One at bedtime 11/11/18   [name], MD  fluticasone [facility]) 50 MCG/ACT nasal spray USE 2 SPRAYS IN [facility] NOSTRIL DAILY 03/23/20   [name], [name], MD  hydrochlorothiazide (HYDRODIURIL) 25 MG tablet TAKE ONE TABLET BY MOUTH ONCE DAILY 02/19/21   [name],  [name], MD  ibuprofen (ADVIL) 200 MG tablet Take 400 mg by mouth every 6 (six) hours as needed for moderate pain.    [provider]  methocarbamol (ROBAXIN) 500 MG tablet Take 1 tablet (500 mg total) by mouth every 8 (eight) hours as needed for muscle spasms. 09/08/20   [name], MD  Multiple Vitamins-Minerals (ZINC PO) Take 1 tablet by mouth daily.    [provider]  ofloxacin (FLOXIN) 0.3 % OTIC solution Place 10 drops into the right ear daily. 01/09/21   [name], [name], MD  pantoprazole (PROTONIX) 40 MG tablet TAKE ONE TABLET BY MOUTH TWICE DAILY BEFORE MEALS. TAKE 30 TO 60 MINUTES BEFORE FIRST MEAL OF THE DAY 06/26/20   [name], MD  valACYclovir (VALTREX) 1000 MG tablet Take 1 tablet (1,000 mg total) by mouth 3 (three) times daily. For shingles 01/09/21    [name], [name], MD  valsartan (DIOVAN) 320 MG tablet TAKE 1/2 TABLET BY MOUTH ONCE DAILY Patient taking differently: 160 mg. 11/30/20   [name], [name], MD  VITAMIN D PO Take 1 capsule by mouth daily.    [provider]    Family History Family History  Problem Relation Age of Onset  . Hypertension Mother   . Alzheimer's disease Mother   . Hypertension Father   . Prostate cancer Father   . HIV Brother   . Lupus Sister   . Colon cancer Paternal Grandmother   . Esophageal cancer Neg Hx   . Liver cancer Neg Hx   . Pancreatic cancer Neg Hx   . Rectal cancer Neg Hx   . Stomach cancer Neg Hx     Social History Social History   Tobacco Use  . Smoking status: Former Smoker    Packs/day: 0.50    Years: 17.00    Pack years: 8.50    Types: Cigarettes    Quit date: 12/12/2010    Years since quitting: 10.2  . Smokeless tobacco: Never Used  Vaping Use  . Vaping Use: Never used  Substance Use Topics  . Alcohol use: No  . Drug use: No     Allergies   Diovan hct [valsartan-hydrochlorothiazide]; Purified water [water, sterile]; Allegra [fexofenadine]; Chocolate; Cimetidine; Citric acid; Coly-mycin s; Peanut-containing drug products; Valsartan; and Amlodipine   Review of Systems Review of Systems Pertinent negatives listed in HPI   Physical Exam Triage Vital Signs ED Triage Vitals  Enc Vitals Group     BP 03/27/21 1741 (!) 132/92     Pulse Rate 03/27/21 1741 99     Resp 03/27/21 1741 20     Temp 03/27/21 1741 (!) 100.8 F (38.2 C)     Temp Source 03/27/21 1741 Oral     SpO2 03/27/21 1741 100 %     Weight --      Height --      Head Circumference --      Peak Flow --      Pain Score 03/27/21 1742 7     Pain Loc --      Pain Edu? --      Excl. in [place]? --    No data found.  Updated Vital Signs BP (!) 132/92 (BP Location: Left Arm)   Pulse 99   Temp (!) 100.8 F (38.2 C) (Oral)   Resp 20   SpO2 100%   Visual Acuity Right Eye Distance:   Left Eye  Distance:   Bilateral Distance:    Right Eye  Near:   Left Eye Near:    Bilateral Near:     Physical Exam General appearance: alert, Ill-appearing, no distress Head: Normocephalic, without obvious abnormality, atraumatic ENT: Ears normal, nares mucosal edema, congestion, oropharynx w/o exudate Respiratory: Respirations even , unlabored, coarse lung sound, no wheeze, rales, or rhonchi Heart: rate and rhythm normal. No gallop or murmurs noted on exam  Abdomen: BS +, no distention, no rebound tenderness, or no mass Extremities: No gross deformities Skin: Skin color, texture, turgor normal. No rashes seen  Psych: Appropriate mood and affect. Neurologic: Mental status: Alert, oriented to person, place, and time, thought content appropriate.   UC Treatments / Results  Labs (all labs ordered are listed, but only abnormal results are displayed) Labs Reviewed  SARS CORONAVIRUS 2 (TAT 6-24 HRS)    EKG   Radiology No results found.  Procedures Procedures (including critical care time)  Medications Ordered in UC Medications  acetaminophen (TYLENOL) tablet 650 mg (has no administration in time range)  ondansetron (ZOFRAN-ODT) disintegrating tablet 4 mg (has no administration in time range)    Initial Impression / Assessment and Plan / UC Course  I have reviewed the triage vital signs and the nursing notes.  Pertinent labs & imaging results that were available during my care of the patient were reviewed by me and considered in my medical decision making (see chart for details).    Patient presents today with fever and symptoms consistent with that of a viral illness.  COVID test pending. Encourage symptom management with Tylenol and ibuprofen for fever, prescribed Promethazine DM for cough along with Zofran for nausea.  Continue home asthma treatments.  ER precautions given if symptoms worsen Final Clinical Impressions(s) / UC Diagnoses   Final diagnoses:  Viral illness  Fever,  unspecified   Discharge Instructions   None    ED Prescriptions    Medication Sig Dispense Auth. Provider   ondansetron (ZOFRAN ODT) 4 MG disintegrating tablet Take 1 tablet (4 mg total) by mouth every 8 (eight) hours as needed for nausea or vomiting. 20 tablet Scot Jun, FNP   promethazine-dextromethorphan (PROMETHAZINE-DM) 6.25-15 MG/5ML syrup Take 5 mLs by mouth 3 (three) times daily as needed for cough. 118 mL Scot Jun, FNP     PDMP not reviewed this encounter.   Scot Jun, FNP 03/27/21 Vernelle Emerald

## 2021-03-27 NOTE — Progress Notes (Signed)
Virtual Visit via Video Note  I connected with [name]  on 03/27/21 at  4:20 PM EDT by a video enabled telemedicine application and verified that I am speaking with the correct person using two identifiers.  Location patient: home, [place] Location provider:work or home office Persons participating in the virtual visit: patient, provider  I discussed the limitations of evaluation and management by telemedicine and the availability of in person appointments. The patient expressed understanding and agreed to proceed.   HPI:  Acute telemedicine visit for flu like symptoms: -Onset: 7 days ago -Symptoms include: body aches, cough, feels tired, headaches, sore throat, vomiting, subjective fever, chills - worsening today with malaise and vomiting -did use her albuterol once yesterday -Denies: SOB, CP, inability eat/drink/get out of bed -no known sick contacts, sick contacts, travel -Pertinent past medical history:see below -Pertinent medication allergies: Allergies  Allergen Reactions  . Diovan Hct [Valsartan-Hydrochlorothiazide] Swelling    Had facial swelling and throat itching after 2 doses of the generic had done well on the brand medicine for a while.  See ED visit February 2014  . Purified Water [Water, Sterile] Anaphylaxis  . Allegra [Fexofenadine] Hives  . Chocolate     triggers Asthma  . Cimetidine Hives    REACTION: unspecified  . Citric Acid     REACTION: swelling, hives, itching  . Coly-Mycin S Other (See Comments)    REACTION: pt reports she passed out  . Peanut-Containing Drug Products Swelling  . Valsartan Swelling  . Amlodipine Other (See Comments)    At 5 mg dose; swelling   -COVID-19 vaccine status: not vaccinated  ROS: See pertinent positives and negatives per HPI.  Past Medical History:  Diagnosis Date  . 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   . Allergy   . Anemia   . Asthma   . GERD (gastroesophageal reflux disease)   . Heart murmur   .  Hypertension   . Positive TB test   . Shingles   . TOBACCO USE 03/22/2009   Qualifier: Diagnosis of  By: Regis Bill MD, [name]  Stopped Dec 12 2010      Past Surgical History:  Procedure Laterality Date  . ABDOMINAL HYSTERECTOMY    . BREAST SURGERY     cyst from left breast  . BUNIONECTOMY     both feet  . CHOLECYSTECTOMY    . CYSTECTOMY     left shoulder  . KNEE SURGERY Left   . PANENDOSCOPY    . TUBAL LIGATION    . uterine ablastion     and polyps removed  . Uterine polyps       Current Outpatient Medications:  .  albuterol (PROVENTIL) (2.5 MG/3ML) 0.083% nebulizer solution, Take 3 mLs (2.5 mg total) by nebulization every 6 (six) hours as needed for wheezing or shortness of breath., Disp: 125 mL, Rfl: 12 .  albuterol (VENTOLIN HFA) 108 (90 Base) MCG/ACT inhaler, Inhale 2 puffs into the lungs every 4 (four) hours as needed for wheezing or shortness of breath., Disp: 1 each, Rfl: 2 .  budesonide-formoterol (SYMBICORT) 80-4.5 MCG/ACT inhaler, INHALE 2 PUFFS BY MOUTH INTO THE LUNGS TWICE DAILY, Disp: 1 each, Rfl: 5 .  EPINEPHRINE 0.3 mg/0.3 mL IJ SOAJ injection, INJECT 1 SYRINGE INTO THE MUSCLE AS NEEDED FOR ANAPHYLAXIS (Patient taking differently: Inject 0.3 mg into the muscle as needed for anaphylaxis.), Disp: 2 each, Rfl: 1 .  famotidine (PEPCID) 20 MG tablet, One at bedtime (Patient taking differently: Take  20 mg by mouth at bedtime. One at bedtime), Disp: 60 tablet, Rfl: 11 .  fluticasone (FLONASE) 50 MCG/ACT nasal spray, USE 2 SPRAYS IN EACH NOSTRIL DAILY, Disp: 48 g, Rfl: 12 .  hydrochlorothiazide (HYDRODIURIL) 25 MG tablet, TAKE ONE TABLET BY MOUTH ONCE DAILY, Disp: 30 tablet, Rfl: 4 .  ibuprofen (ADVIL) 200 MG tablet, Take 400 mg by mouth every 6 (six) hours as needed for moderate pain., Disp: , Rfl:  .  methocarbamol (ROBAXIN) 500 MG tablet, Take 1 tablet (500 mg total) by mouth every 8 (eight) hours as needed for muscle spasms., Disp: 10 tablet, Rfl: 0 .  Multiple  Vitamins-Minerals (ZINC PO), Take 1 tablet by mouth daily., Disp: , Rfl:  .  ofloxacin (FLOXIN) 0.3 % OTIC solution, Place 10 drops into the right ear daily., Disp: 10 mL, Rfl: 0 .  pantoprazole (PROTONIX) 40 MG tablet, TAKE ONE TABLET BY MOUTH TWICE DAILY BEFORE MEALS. TAKE 30 TO 60 MINUTES BEFORE FIRST MEAL OF THE DAY, Disp: 60 tablet, Rfl: 0 .  valACYclovir (VALTREX) 1000 MG tablet, Take 1 tablet (1,000 mg total) by mouth 3 (three) times daily. For shingles, Disp: 21 tablet, Rfl: 0 .  valsartan (DIOVAN) 320 MG tablet, TAKE 1/2 TABLET BY MOUTH ONCE DAILY (Patient taking differently: 160 mg.), Disp: 30 tablet, Rfl: 2 .  VITAMIN D PO, Take 1 capsule by mouth daily., Disp: , Rfl:   EXAM:  VITALS per patient if applicable:  GENERAL: alert, oriented, appears well and in no acute distress  HEENT: atraumatic, conjunttiva clear, no obvious abnormalities on inspection of external nose and ears  NECK: normal movements of the head and neck  LUNGS: on inspection no signs of respiratory distress, breathing rate appears normal, no obvious gross SOB, gasping or wheezing  CV: no obvious cyanosis  MS: moves all visible extremities without noticeable abnormality  PSYCH/NEURO: pleasant and cooperative, no obvious depression or anxiety, speech and thought processing grossly intact  ASSESSMENT AND PLAN:  Discussed the following assessment and plan:  Flu-like symptoms  -we discussed possible serious and likely etiologies, options for evaluation and workup, limitations of telemedicine visit vs in person visit, treatment, treatment risks and precautions. Pt prefers to treat via telemedicine empirically rather than in person at this moment. Query COVID19, influenza vs other. Given the duration of symptoms, worsening and unvaccinated status, advised inperson evaluation at a higher level of care today. Discussed options since PCP office was unavailble for inperson care this evening.  She prefers to go to [facility]  via private vehicle, reports she has someone who can drive her and agrees to go today.    I discussed the assessment and treatment plan with the patient. The patient was provided an opportunity to ask questions and all were answered. The patient agreed with the plan and demonstrated an understanding of the instructions.     Lucretia Kern, DO

## 2021-03-27 NOTE — Patient Instructions (Addendum)
Please seek inperson care this evening as we discussed at an Urgent Care Clinic.     I hope you are feeling better soon!   It was nice to meet you today. I help [facility] out with telemedicine visits on Tuesdays and Thursdays and am available for visits on those days. If you have any concerns or questions following this visit please schedule a follow up visit with your Primary Care doctor or seek care at a local urgent care clinic to avoid delays in care.

## 2021-03-28 LAB — SARS CORONAVIRUS 2 (TAT 6-24 HRS): SARS Coronavirus 2: POSITIVE — AB

## 2021-03-29 ENCOUNTER — Telehealth: Payer: Self-pay

## 2021-03-29 NOTE — Telephone Encounter (Signed)
Called to discuss with patient about COVID-19 symptoms and the use of one of the available treatments for those with mild to moderate Covid symptoms and at a high risk of hospitalization.  Pt appears to qualify for outpatient treatment due to co-morbid conditions and/or a member of an at-risk group in accordance with the FDA Emergency Use Authorization.    Symptom onset: 03/25/21 Body aches,cough,fever Vaccinated: No Booster? No Immunocompromised? No Qualifiers: Asthma NIH Criteria: Tier 2  Declines further treatment.   [name]

## 2021-04-02 NOTE — Telephone Encounter (Signed)
Was out of office for 2 weeks   Ask you pharmacy insurance  If they will  cover  Generic albuterol for nebulizer as this is the same .  Otherwise   need to get  Pulmonary who  Had ordered also this medicaiton

## 2021-04-02 NOTE — Telephone Encounter (Signed)
I have not  Prescribed this medication for  Nebulizer( just the [facility])    last was  Dr Meda Coffee.  Will need to have visit with her or me    For  Fu

## 2021-04-03 NOTE — Telephone Encounter (Signed)
Patient informed of the message below. Patient wantd to let PCP know that she had tested positive for Covid for the 3rd time. Virtual visit scheduled for 06/02. Patient declined earlier visit with PCP.

## 2021-04-05 ENCOUNTER — Telehealth (INDEPENDENT_AMBULATORY_CARE_PROVIDER_SITE_OTHER): Payer: BC Managed Care – PPO | Admitting: Internal Medicine

## 2021-04-05 ENCOUNTER — Encounter: Payer: Self-pay | Admitting: Internal Medicine

## 2021-04-05 DIAGNOSIS — U071 COVID-19: Secondary | ICD-10-CM | POA: Diagnosis not present

## 2021-04-05 DIAGNOSIS — J45991 Cough variant asthma: Secondary | ICD-10-CM

## 2021-04-05 DIAGNOSIS — Z79899 Other long term (current) drug therapy: Secondary | ICD-10-CM

## 2021-04-05 DIAGNOSIS — Z8616 Personal history of COVID-19: Secondary | ICD-10-CM

## 2021-04-05 MED ORDER — PREDNISONE 20 MG PO TABS: 20.0000 mg | ORAL_TABLET | Freq: Two times a day (BID) | ORAL | 0 refills | Status: DC

## 2021-04-05 NOTE — Progress Notes (Signed)
Virtual Visit via Video Note  I connected with@ on 04/05/21 at 11:00 AM EDT by a video enabled telemedicine application and verified that I am speaking with the correct person using two identifiers. Location patient: home Location provider: home office Persons participating in the virtual visit: patient, provider  [place] national recommendations  regarding COVID 19 pandemic   video visit is advised over in office visit for this patient.  Patient aware  of the limitations of evaluation and management by telemedicine and  availability of in person appointments. and agreed to proceed.   HPI: [name] presents for video visit for repeated  Pos testing for covid 19 most recent 5 24 had the onset of symptoms May 23 or thereabouts had a video visit with Dr. Maudie Mercury saw urgent care and a positive COVID test.  Antigen.  Since that time she was written out of work because of her coughing low-grade fever and feeling badly.  [facility] who wrote her out of work until June 6.  She has continued coughing has been using albuterol with some help.  No fever for a few days cough is either nonproductive or minimal clear like cannot get up sometimes tight.  Is using her asthma regimen. She has been in a 6 weeks class for certification and is missed already 2 weeks of it and her advice is that she should drop out and restart her class work with a note for medical withdrawal. 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  She has not been immunized against COVID but documented positive COVID test with symptoms 2 other times November 2020 documented by [facility] PCR and April 2021 positive rapid antigen test.  She has tested negative in between for various times until this episode.    ROS: See pertinent positives and negatives per HPI.  Past Medical History:  Diagnosis Date  . 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   . Allergy   . Anemia   .  Asthma   . GERD (gastroesophageal reflux disease)   . Heart murmur   . Hypertension   . Positive TB test   . Shingles   . TOBACCO USE 03/22/2009   Qualifier: Diagnosis of  By: Regis Bill MD, [name]  Stopped Dec 12 2010      Past Surgical History:  Procedure Laterality Date  . ABDOMINAL HYSTERECTOMY    . BREAST SURGERY     cyst from left breast  . BUNIONECTOMY     both feet  . CHOLECYSTECTOMY    . CYSTECTOMY     left shoulder  . KNEE SURGERY Left   . PANENDOSCOPY    . TUBAL LIGATION    . uterine ablastion     and polyps removed  . Uterine polyps      Family History  Problem Relation Age of Onset  . Hypertension Mother   . Alzheimer's disease Mother   . Hypertension Father   . Prostate cancer Father   . HIV Brother   . Lupus Sister   . Colon cancer Paternal Grandmother   . Esophageal cancer Neg Hx   . Liver cancer Neg Hx   . Pancreatic cancer Neg Hx   . Rectal cancer Neg Hx   . Stomach cancer Neg Hx     Social History   Tobacco Use  . Smoking status: Former Smoker    Packs/day: 0.50    Years:  17.00    Pack years: 8.50    Types: Cigarettes    Quit date: 12/12/2010    Years since quitting: 10.3  . Smokeless tobacco: Never Used  Vaping Use  . Vaping Use: Never used  Substance Use Topics  . Alcohol use: No  . Drug use: No      Current Outpatient Medications:  .  albuterol (PROVENTIL) (2.5 MG/3ML) 0.083% nebulizer solution, Take 3 mLs (2.5 mg total) by nebulization every 6 (six) hours as needed for wheezing or shortness of breath., Disp: 125 mL, Rfl: 12 .  albuterol (VENTOLIN HFA) 108 (90 Base) MCG/ACT inhaler, Inhale 2 puffs into the lungs every 4 (four) hours as needed for wheezing or shortness of breath., Disp: 1 each, Rfl: 2 .  budesonide-formoterol (SYMBICORT) 80-4.5 MCG/ACT inhaler, INHALE 2 PUFFS BY MOUTH INTO THE LUNGS TWICE DAILY, Disp: 1 each, Rfl: 5 .  EPINEPHRINE 0.3 mg/0.3 mL IJ SOAJ injection, INJECT 1 SYRINGE INTO THE MUSCLE AS NEEDED FOR  ANAPHYLAXIS (Patient taking differently: Inject 0.3 mg into the muscle as needed for anaphylaxis.), Disp: 2 each, Rfl: 1 .  famotidine (PEPCID) 20 MG tablet, One at bedtime (Patient taking differently: Take 20 mg by mouth at bedtime. One at bedtime), Disp: 60 tablet, Rfl: 11 .  fluticasone (FLONASE) 50 MCG/ACT nasal spray, USE 2 SPRAYS IN EACH NOSTRIL DAILY, Disp: 48 g, Rfl: 12 .  hydrochlorothiazide (HYDRODIURIL) 25 MG tablet, TAKE ONE TABLET BY MOUTH ONCE DAILY, Disp: 30 tablet, Rfl: 4 .  ibuprofen (ADVIL) 200 MG tablet, Take 400 mg by mouth every 6 (six) hours as needed for moderate pain., Disp: , Rfl:  .  methocarbamol (ROBAXIN) 500 MG tablet, Take 1 tablet (500 mg total) by mouth every 8 (eight) hours as needed for muscle spasms., Disp: 10 tablet, Rfl: 0 .  Multiple Vitamins-Minerals (ZINC PO), Take 1 tablet by mouth daily., Disp: , Rfl:  .  ofloxacin (FLOXIN) 0.3 % OTIC solution, Place 10 drops into the right ear daily., Disp: 10 mL, Rfl: 0 .  ondansetron (ZOFRAN ODT) 4 MG disintegrating tablet, Take 1 tablet (4 mg total) by mouth every 8 (eight) hours as needed for nausea or vomiting., Disp: 20 tablet, Rfl: 0 .  pantoprazole (PROTONIX) 40 MG tablet, TAKE ONE TABLET BY MOUTH TWICE DAILY BEFORE MEALS. TAKE 30 TO 60 MINUTES BEFORE FIRST MEAL OF THE DAY, Disp: 60 tablet, Rfl: 0 .  predniSONE (DELTASONE) 20 MG tablet, Take 1 tablet (20 mg total) by mouth 2 (two) times daily with a meal. For asthma flare, Disp: 10 tablet, Rfl: 0 .  promethazine-dextromethorphan (PROMETHAZINE-DM) 6.25-15 MG/5ML syrup, Take 5 mLs by mouth 3 (three) times daily as needed for cough., Disp: 118 mL, Rfl: 0 .  valACYclovir (VALTREX) 1000 MG tablet, Take 1 tablet (1,000 mg total) by mouth 3 (three) times daily. For shingles, Disp: 21 tablet, Rfl: 0 .  valsartan (DIOVAN) 320 MG tablet, TAKE 1/2 TABLET BY MOUTH ONCE DAILY (Patient taking differently: 160 mg.), Disp: 30 tablet, Rfl: 2 .  VITAMIN D PO, Take 1 capsule by mouth  daily., Disp: , Rfl:   EXAM: BP Readings from Last 3 Encounters:  03/27/21 (!) 132/92  01/17/21 130/86  01/09/21 128/84    VITALS per patient if applicable:  GENERAL: alert, oriented, appears well and in no acute distress  HEENT: atraumatic, conjunttiva clear, no obvious abnormalities on inspection of external nose and ears is congested.  NECK: normal movements of the head and neck  LUNGS: on inspection  no signs of respiratory distress, breathing rate appears normal, no obvious gross SOB, gasping or wheezing has a recurrent bronchial cough but no respiratory distress  CV: no obvious cyanosis  MS: moves all visible extremities without noticeable abnormality  PSYCH/NEURO: pleasant and cooperative, no obvious depression or anxiety, speech and thought processing grossly intact Lab Results  Component Value Date   WBC 5.6 01/17/2021   HGB 11.9 (L) 01/17/2021   HCT 36.8 01/17/2021   PLT 312.0 01/17/2021   GLUCOSE 78 01/17/2021   CHOL 195 05/19/2018   TRIG 41.0 05/19/2018   HDL 83.80 05/19/2018   LDLCALC 103 (H) 05/19/2018   ALT 11 01/17/2021   AST 16 01/17/2021   NA 141 01/17/2021   K 3.8 01/17/2021   CL 104 01/17/2021   CREATININE 1.02 01/17/2021   BUN 16 01/17/2021   CO2 30 01/17/2021   TSH 0.72 05/19/2018   INR 1.0 01/17/2021   MICROALBUR 0.7 05/30/2009    ASSESSMENT AND PLAN:  Discussed the following assessment and plan:    ICD-10-CM   1. COVID-19 virus infection  U07.1    see text   2. Cough variant asthma  J45.991   3. History of 2019 novel coronavirus disease (COVID-19)  Z86.20 Sep 2019 and april 2021 documented w positive tests.   4. Medication management  Z79.899    Respiratory support no signs of secondary pneumonia may benefit from a burst of steroids because of her asthmatic condition at aggravating by COVID.  Sending 5 days of prednisone get [place] the information about medical withdrawal and will write a note to her MyChart for documentation. If not  feeling she can go back to work next week contact us also for reevaluation. Unusual to have 3 infections with COVID that are significantly symptomatic although she did not receive the vaccine.  The timing is different variants as last infection was a year ago this is most likely omicron 2.  Which appears to be increasing in the community.  Reviewed imitations of masking and for self protection in the future N95 type masking is optimum but not always practical.  Counseled.   Expectant management and discussion of plan and treatment with opportunity to ask questions and all were answered. The patient agreed with the plan and demonstrated an understanding of the instructions.   Advised to call back or seek an in-person evaluation if worsening  or having  further concerns . Return if symptoms worsen or fail to improve, for and as indicated .   Shanon Ace, MD

## 2021-04-06 ENCOUNTER — Telehealth: Payer: Self-pay | Admitting: Internal Medicine

## 2021-04-06 NOTE — Telephone Encounter (Signed)
Patient dropped off forms to be completed. Placed in Dr. Regis Bill folder. Please call [phone] when ready for pick-up.

## 2021-04-09 ENCOUNTER — Other Ambulatory Visit: Payer: Self-pay | Admitting: Internal Medicine

## 2021-04-09 DIAGNOSIS — Z1231 Encounter for screening mammogram for malignant neoplasm of breast: Secondary | ICD-10-CM

## 2021-04-09 NOTE — Telephone Encounter (Signed)
Forms have been placed in red folder for review.

## 2021-04-13 ENCOUNTER — Other Ambulatory Visit: Payer: Self-pay | Admitting: Internal Medicine

## 2021-04-15 NOTE — Telephone Encounter (Signed)
I AM OUT OF OFFICE  TO RETURN TOMORROW jUNE 13. Please  locate the form  and will do as soon as posible

## 2021-04-16 NOTE — Telephone Encounter (Signed)
Patient called following up on a form that she dropped off on 04/09/2021.  I advised the patient that Dr. Regis Bill was out of the office last week and her medical assistant will contact her when it is ready for pick.

## 2021-04-16 NOTE — Telephone Encounter (Signed)
Form have been placed in red folder.

## 2021-04-17 ENCOUNTER — Encounter: Payer: Self-pay | Admitting: Internal Medicine

## 2021-04-17 NOTE — Telephone Encounter (Signed)
Forms placed in front office for pickup 

## 2021-04-17 NOTE — Telephone Encounter (Signed)
Letter has been printed and signed.  To your desk.

## 2021-04-18 NOTE — Telephone Encounter (Signed)
Forms have been completed and pt has been informed. Forms have been placed in front office for pickup.

## 2021-04-18 NOTE — Telephone Encounter (Signed)
This is a repeat message letters already been done

## 2021-04-19 NOTE — Telephone Encounter (Signed)
Forms have been placed in front office for pickup. Pt has been notified.

## 2021-05-01 ENCOUNTER — Other Ambulatory Visit: Payer: Self-pay

## 2021-05-02 ENCOUNTER — Ambulatory Visit (INDEPENDENT_AMBULATORY_CARE_PROVIDER_SITE_OTHER): Payer: BC Managed Care – PPO | Admitting: Internal Medicine

## 2021-05-02 ENCOUNTER — Encounter: Payer: Self-pay | Admitting: Internal Medicine

## 2021-05-02 VITALS — BP 150/96 | HR 65 | Temp 98.4°F | Ht 60.0 in | Wt 145.0 lb

## 2021-05-02 DIAGNOSIS — L989 Disorder of the skin and subcutaneous tissue, unspecified: Secondary | ICD-10-CM | POA: Diagnosis not present

## 2021-05-02 DIAGNOSIS — M25412 Effusion, left shoulder: Secondary | ICD-10-CM | POA: Diagnosis not present

## 2021-05-02 DIAGNOSIS — M25512 Pain in left shoulder: Secondary | ICD-10-CM

## 2021-05-02 DIAGNOSIS — M542 Cervicalgia: Secondary | ICD-10-CM

## 2021-05-02 NOTE — Progress Notes (Signed)
Chief Complaint  Patient presents with   Mass    Patient  complains of bumps on right lower extremity and left shoulder, x1 week, Patient denies any known insect bite, Tender to touch with no redness    HPI:  [name] 55 y.o. come in for couple of new issues. 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 She is right-hand dominant.  There is a small skin rash on her left breast just noticed it no itching no mass  ROS: See pertinent positives and negatives per HPI. Recovered from covid , works Proofreader Past Medical History:  Diagnosis Date   Allergic reaction caused by a drug 01/25/2013   probably.    Allergy    Anemia    Asthma    GERD (gastroesophageal reflux disease)    Heart murmur    Hypertension    Positive TB test    Shingles    TOBACCO USE 03/22/2009   Qualifier: Diagnosis of  By: Regis Bill MD, [name]  Stopped Dec 12 2010      Family History  Problem Relation Age of Onset   Hypertension Mother    Alzheimer's disease Mother    Hypertension Father    Prostate cancer Father    HIV Brother    Lupus Sister    Colon cancer Paternal Grandmother    Esophageal cancer Neg Hx    Liver cancer Neg Hx    Pancreatic cancer Neg Hx    Rectal cancer Neg Hx    Stomach cancer Neg Hx     Social History   Socioeconomic History   Marital status: Divorced    Spouse name: Not on file   Number of children: Not on file   Years of education: Not on file   Highest education level: Not on file  Occupational History   Not on file  Tobacco Use   Smoking status:  Former    Packs/day: 0.50    Years: 17.00    Pack years: 8.50    Types: Cigarettes    Quit date: 12/12/2010    Years since quitting: 10.3   Smokeless tobacco: Never  Vaping Use   Vaping Use: Never used  Substance and Sexual Activity   Alcohol use: No   Drug use: No   Sexual activity: Not on file  Other Topics Concern   Not on file  Social History Narrative   Divorced    Tobacco since age 66 stopped feb 2012   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 125 mL 12   albuterol (VENTOLIN HFA) 108 (90 Base) MCG/ACT inhaler Inhale 2 puffs into the lungs every 4 (four) hours as needed for wheezing or shortness of breath. 1 each 2   budesonide-formoterol (SYMBICORT) 80-4.5 MCG/ACT inhaler INHALE 2 PUFFS BY MOUTH INTO THE LUNGS TWICE DAILY 1 each 5   EPINEPHRINE 0.3 mg/0.3 mL IJ SOAJ injection INJECT 1 SYRINGE INTO THE MUSCLE AS NEEDED FOR ANAPHYLAXIS (Patient taking differently: Inject 0.3 mg into the muscle as needed for anaphylaxis.) 2 each 1   famotidine (PEPCID) 20 MG tablet One at bedtime (Patient taking differently: Take 20 mg by mouth at bedtime. One at bedtime) 60 tablet 11   fluticasone (FLONASE) 50 MCG/ACT nasal spray USE 2 SPRAYS IN EACH NOSTRIL DAILY 48 g 2   hydrochlorothiazide (HYDRODIURIL) 25 MG tablet TAKE ONE TABLET BY MOUTH ONCE DAILY 30 tablet 4   ibuprofen (ADVIL) 200 MG tablet Take 400 mg by mouth every 6 (six) hours as needed for moderate pain.      methocarbamol (ROBAXIN) 500 MG tablet Take 1 tablet (500 mg total) by mouth every 8 (eight) hours as needed for muscle spasms. 10 tablet 0   Multiple Vitamins-Minerals (ZINC PO) Take 1 tablet by mouth daily.     ofloxacin (FLOXIN) 0.3 % OTIC solution Place 10 drops into the right ear daily. 10 mL 0   ondansetron (ZOFRAN ODT) 4 MG disintegrating tablet Take 1 tablet (4 mg total) by mouth every 8 (eight) hours as needed for nausea or vomiting. 20 tablet 0   pantoprazole (PROTONIX) 40 MG tablet TAKE ONE TABLET BY MOUTH TWICE DAILY BEFORE MEALS. TAKE 30 TO 60 MINUTES BEFORE FIRST MEAL OF THE DAY 60 tablet 0   predniSONE (DELTASONE) 20 MG tablet Take 1 tablet (20 mg total) by mouth 2 (two) times daily with a meal. For asthma flare 10 tablet 0   promethazine-dextromethorphan (PROMETHAZINE-DM) 6.25-15 MG/5ML syrup Take 5 mLs by mouth 3 (three) times daily as needed for cough. 118 mL 0   valACYclovir (VALTREX) 1000 MG tablet Take 1 tablet (1,000 mg total) by mouth 3 (three) times daily. For shingles 21 tablet 0   valsartan (DIOVAN) 320 MG tablet TAKE 1/2 TABLET BY MOUTH ONCE DAILY (Patient taking differently: 160 mg.) 30 tablet 2   VITAMIN D PO Take 1 capsule by mouth daily.     No facility-administered medications prior to visit.     EXAM:  BP (!) 150/96 (BP Location: Left Arm, Patient Position: Sitting, Cuff Size: Normal)   Pulse 65   Temp 98.4 F (36.9 C) (Oral)   Ht 5' (1.524 m)   Wt 145 lb (65.8 kg)   SpO2 96%   BMI 28.32 kg/m   Body mass index is 28.32 kg/m.  GENERAL: vitals reviewed and listed above, alert, oriented, appears well hydrated and in no acute distress HEENT: atraumatic, conjunctiva  clear, no obvious abnormalities on inspection of external nose and ears OP : Mast NECK: no obvious masses on inspection palpation   MS: moves all extremities left shoulder about a 2 cm cystic-like area at the top of the shoulder with an inferior old surgical site Dagar range of motion of  shoulder is normal but is having some tenderness and discomfort along the left trapezius neck and shoulder area. Skin two 2 mm dark red-brown purplish dots on the right shin area lower puncture no blisters streaking warmth.  Superficial. A 4 mm round superficial lesion  left breast nonpalpable has a rim no central clearing but discolored and skin markings. Examination of back looks like a faded area of discoloration that could have been a bruise but no alarming findings. PSYCH: pleasant and cooperative, no obvious depression or anxiety Lab Results  Component Value Date   WBC 5.6 01/17/2021   HGB 11.9 (L) 01/17/2021   HCT 36.8 01/17/2021   PLT 312.0 01/17/2021   GLUCOSE 78 01/17/2021   CHOL 195 05/19/2018   TRIG 41.0 05/19/2018   HDL 83.80 05/19/2018   LDLCALC 103 (H) 05/19/2018   ALT 11 01/17/2021   AST 16 01/17/2021   NA 141 01/17/2021   K 3.8 01/17/2021   CL 104 01/17/2021   CREATININE 1.02 01/17/2021   BUN 16 01/17/2021   CO2 30 01/17/2021   TSH 0.72 05/19/2018   INR 1.0 01/17/2021   MICROALBUR 0.7 05/30/2009   BP Readings from Last 3 Encounters:  05/02/21 (!) 150/96  03/27/21 (!) 132/92  01/17/21 130/86    ASSESSMENT AND PLAN:  Discussed the following assessment and plan:  Pain and swelling of left shoulder cystic mass  - Uncertain if skin or joint related. - Plan: Ambulatory referral to Sports Medicine  Skin lesion  Neck pain on left side - Plan: Ambulatory referral to Sports Medicine Skin bumps on the right lower extremity could just be insect bites or something 9 significant would watch over weeks if spreads let us know Area on the breast could be something like early granuloma annulare but does not look significant I doubt tinea not related to breast tissue.  Take a picture and will follow. Certain if the cyst is related to the joint or separate but has intermittent pain on that neck trapezius shoulder area Discussed options referral refer back to sports medicine  as they have point-of-care ultrasound and may be able delineate the cystic mass. -Patient advised to return or notify health care team  if  new concerns arise.  Patient Instructions  Not concerned about the leg bumps  may be there a while still could be  insect  bites.   Left breast  rash   follow  take baseline picture . Not breast related.   Plan sports medicine  consult about the  cyst and neck upper  back shoulder  pain .  [name]. Santiaga Butzin M.D.

## 2021-05-02 NOTE — Patient Instructions (Signed)
Not concerned about the leg bumps  may be there a while still could be  insect  bites.   Left breast  rash   follow  take baseline picture . Not breast related.   Plan sports medicine  consult about the  cyst and neck upper  back shoulder  pain .

## 2021-05-17 NOTE — Progress Notes (Signed)
Subjective:    I'm seeing this patient as a consultation for: Dr. Shanon Ace. Note will be routed back to referring provider/PCP.  CC: Left shoulder and neck pain  I, Molly Weber, LAT, ATC, am serving as scribe for Dr. [name].  HPI: Pt is a 55 y/o RHD female c/o L shoulder cyst/lump x several years that has recently increased in size and is now causing pain radiating into her L scap and L lateral neck.  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 Pt locates pain to   Neck pain: yes  Radiates: yes into her L scap, L lateral neck and L post-medial upper arm UE Numbness/tingling: No Aggravates: pressure to cyst area; picking up items w/ her L hnad Treatments tried: Nothing  R wrist: Pain x 1 week w/ no known MOI.  She locates her pain to her R radial-sided wrist that radiates to her R thumb.  Gripping and all R wrist AROM aggravate her symptoms.  She also has pain w/ R thumb opposition AROM and w/ writing/typing.  She has tried [employer] and she has been wearing an adhesive wrap.    Dx imaging: 08/19/18 C-spine CT  Past medical history, Surgical history, Family history, Social history, Allergies, and medications have been entered into the medical record, reviewed. Pos family hx lupus in sister. Hx prior lipoma removal left shoulder about 20 years ago.   Review of Systems: No new headache, visual changes, nausea, vomiting, diarrhea, constipation, dizziness, abdominal pain, skin rash, fevers, chills,, weight loss, swollen lymph nodes, body aches, joint swelling, muscle aches, chest pain, shortness of breath, mood changes, visual or auditory hallucinations.  Positive for fatigue and for night sweats  Objective:    Vitals:   05/18/21 1111  BP: (!) 158/100  Pulse: 65  SpO2: 98%   General: Well Developed, well nourished, and in no acute distress.  Neuro/Psych: Alert and oriented x3, extra-ocular muscles intact, able to move all 4  extremities, sensation grossly intact. Skin: Warm and dry, no rashes noted.  Respiratory: Not using accessory muscles, speaking in full sentences, trachea midline.  Cardiovascular: Pulses palpable, no extremity edema. Abdomen: Does not appear distended. MSK: C-spine normal. Nontender midline.  There is a normal cervical motion. Tender palpation left trapezius. Left shoulder normal appearing Small subcutaneous nodule left lateral shoulder nontender.  Minimally mobile. This is about 1 cm superior to mature scar lateral shoulder.  Left shoulder normal motion normal strength negative impingement testing.  Right wrist slight effusion. Decreased motion to extension flexion radial and ulnar deviation. Significant pain with ulnar deviation. Positive Finkelstein's test. Tender palpation at radial styloid.  Minimally tender dorsal wrist and at first [facility]. Pulses cap refill and sensation are intact distally.  Lab and Radiology Results  Diagnostic Limited MSK Ultrasound of: Left shoulder Subcutaneous mass consistent in appearance with surrounding subcutaneous fat measuring about 1 x 2 cm.  No increased vascular activity.  Appearance typical for lipoma however ultrasound is not capable of full diagnosis of lipoma. Biceps tendon normal-appearing Subscapularis tendon normal. Supraspinatus tendon normal-appearing Infraspinatus tendon normal. AC joint mild effusion. 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  Tendon is intact. First CMC DJD with mild effusion present. Mild dorsal wrist effusion present. Bony structures otherwise normal-appearing Impression: De Quervain's tenosynovitis.  First  CMC DJD.  Mild dorsal wrist effusion.  X-ray images left shoulder and right wrist obtained today personally and independently  interpreted  Left shoulder: No acute fractures.  No significant degenerative changes.  No aggressive appearing bony lesions.  Right wrist: No acute fractures.  First CMC and wrist DJD.  Await formal radiology review  Patient was fitted with a custom made fiberglass dorsal thumb spica spica splint   Impression and Recommendations:    Assessment and Plan: 55 y.o. 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  As noted above ultrasound is not sufficient to fully diagnose a lipoma however that is very likely the cause of the mass..  She does have a history of a prior lipoma excision very near the current probable lipoma.  As this is very likely a recurrence.  Discussed options.  Watchful waiting is certainly an option if its not bothering her too much and is not growing rapidly.  Next step if needed would be MRI to further characterize mass and for potential surgical excision planning.  Obtain x-ray now.  Left trapezius pain due to muscle dysfunction and spasm.  Plan for physical therapy/OT.  Right wrist pain multifactorial.  Patient has de Quervain's tenosynovitis, evidence of first [place] DJD, and a dorsal wrist effusion.  Over this occurs in the setting of fatigue and night sweats and a family history of lupus.  I am concerned that this may be more than just a simple orthopedic issue.  Plan for rheumatologic work-up below.  Additionally treat with fiberglass custom thumb spica splint and referral to hand PT/OT.  Additionally treat with short course of oral prednisone.  Recheck in 1 month.  PDMP not reviewed this encounter. Orders Placed This Encounter  Procedures   [place] LIMITED JOINT SPACE STRUCTURES UP LEFT(NO LINKED CHARGES)    Order Specific Question:   Reason for Exam (SYMPTOM  OR DIAGNOSIS REQUIRED)    Answer:   L shoulder pain    Order Specific Question:   Preferred imaging location?    Answer:   [place]   DG Wrist Complete Right    Standing Status:    Future    Standing Expiration Date:   05/18/2022    Order Specific Question:   Reason for Exam (SYMPTOM  OR DIAGNOSIS REQUIRED)    Answer:   eval wrist pain    Order Specific Question:   Is patient pregnant?    Answer:   No    Order Specific Question:   Preferred imaging location?    Answer:   [facility]   DG Shoulder Left    Standing Status:   Future    Standing Expiration Date:   05/18/2022    Order Specific Question:   Reason for Exam (SYMPTOM  OR DIAGNOSIS REQUIRED)    Answer:   eval shoulder mass    Order Specific Question:   Is patient pregnant?    Answer:   No    Order Specific Question:   Preferred imaging location?    Answer:   [facility]   Comprehensive metabolic panel    Standing Status:   Future    Standing Expiration Date:   05/18/2022   Sedimentation rate    Standing Status:   Future    Standing Expiration Date:   05/18/2022   LUPUS(12) PANEL    Standing Status:   Future    Standing Expiration Date:   05/18/2022   CK    Standing Status:  Future    Standing Expiration Date:   05/18/2022   Rheumatoid factor    Standing Status:   Future    Standing Expiration Date:   05/18/2022   C-reactive protein    Standing Status:   Future    Standing Expiration Date:   05/18/2022   CBC w/Diff    Standing Status:   Future    Standing Expiration Date:   05/18/2022   TSH   Ambulatory referral to Physical Therapy    Referral Priority:   Routine    Referral Type:   Physical Medicine    Referral Reason:   Specialty Services Required    Requested Specialty:   Physical Therapy    Number of Visits Requested:   1   Meds ordered this encounter  Medications   predniSONE (DELTASONE) 50 MG tablet    Sig: Take 1 tablet (50 mg total) by mouth daily.    Dispense:  5 tablet    Refill:  0    Discussed warning signs or symptoms. Please see discharge instructions. Patient expresses understanding.   The above documentation has been reviewed and is accurate and complete  [name], M.D.

## 2021-05-18 ENCOUNTER — Ambulatory Visit (INDEPENDENT_AMBULATORY_CARE_PROVIDER_SITE_OTHER): Payer: BC Managed Care – PPO

## 2021-05-18 ENCOUNTER — Other Ambulatory Visit (HOSPITAL_COMMUNITY): Payer: Self-pay

## 2021-05-18 ENCOUNTER — Ambulatory Visit (INDEPENDENT_AMBULATORY_CARE_PROVIDER_SITE_OTHER): Payer: BC Managed Care – PPO | Admitting: Family Medicine

## 2021-05-18 ENCOUNTER — Other Ambulatory Visit: Payer: Self-pay

## 2021-05-18 ENCOUNTER — Encounter: Payer: Self-pay | Admitting: Family Medicine

## 2021-05-18 ENCOUNTER — Ambulatory Visit: Payer: Self-pay

## 2021-05-18 VITALS — BP 158/100 | HR 65 | Ht 60.0 in | Wt 146.6 lb

## 2021-05-18 DIAGNOSIS — M7989 Other specified soft tissue disorders: Secondary | ICD-10-CM | POA: Diagnosis not present

## 2021-05-18 DIAGNOSIS — M25512 Pain in left shoulder: Secondary | ICD-10-CM

## 2021-05-18 DIAGNOSIS — R5383 Other fatigue: Secondary | ICD-10-CM

## 2021-05-18 DIAGNOSIS — M19031 Primary osteoarthritis, right wrist: Secondary | ICD-10-CM | POA: Diagnosis not present

## 2021-05-18 DIAGNOSIS — R61 Generalized hyperhidrosis: Secondary | ICD-10-CM | POA: Diagnosis not present

## 2021-05-18 DIAGNOSIS — M25531 Pain in right wrist: Secondary | ICD-10-CM

## 2021-05-18 DIAGNOSIS — R768 Other specified abnormal immunological findings in serum: Secondary | ICD-10-CM

## 2021-05-18 DIAGNOSIS — G8929 Other chronic pain: Secondary | ICD-10-CM

## 2021-05-18 LAB — COMPREHENSIVE METABOLIC PANEL
ALT: 9 U/L (ref 0–35)
AST: 15 U/L (ref 0–37)
Albumin: 4.3 g/dL (ref 3.5–5.2)
Alkaline Phosphatase: 54 U/L (ref 39–117)
BUN: 16 mg/dL (ref 6–23)
CO2: 28 mEq/L (ref 19–32)
Calcium: 9.3 mg/dL (ref 8.4–10.5)
Chloride: 104 mEq/L (ref 96–112)
Creatinine, Ser: 0.94 mg/dL (ref 0.40–1.20)
GFR: 68.87 mL/min (ref >60.00)
Glucose, Bld: 90 mg/dL (ref 70–99)
Potassium: 3.6 mEq/L (ref 3.5–5.1)
Sodium: 139 mEq/L (ref 135–145)
Total Bilirubin: 0.3 mg/dL (ref 0.2–1.2)
Total Protein: 7.2 g/dL (ref 6.0–8.3)

## 2021-05-18 LAB — CBC WITH DIFFERENTIAL/PLATELET
Basophils Absolute: 0 10*3/uL (ref 0.0–0.1)
Basophils Relative: 1 % (ref 0.0–3.0)
Eosinophils Absolute: 0.2 10*3/uL (ref 0.0–0.7)
Eosinophils Relative: 4.7 % (ref 0.0–5.0)
HCT: 38.1 % (ref 36.0–46.0)
Hemoglobin: 12.6 g/dL (ref 12.0–15.0)
Lymphocytes Relative: 41.7 % (ref 12.0–46.0)
Lymphs Abs: 2 10*3/uL (ref 0.7–4.0)
MCHC: 33 g/dL (ref 30.0–36.0)
MCV: 85 fl (ref 78.0–100.0)
Monocytes Absolute: 0.4 10*3/uL (ref 0.1–1.0)
Monocytes Relative: 7.4 % (ref 3.0–12.0)
Neutro Abs: 2.2 10*3/uL (ref 1.4–7.7)
Neutrophils Relative %: 45.2 % (ref 43.0–77.0)
Platelets: 324 10*3/uL (ref 150.0–400.0)
RBC: 4.48 Mil/uL (ref 3.87–5.11)
RDW: 16.2 % — ABNORMAL HIGH (ref 11.5–15.5)
WBC: 4.8 10*3/uL (ref 4.0–10.5)

## 2021-05-18 LAB — TSH: TSH: 0.97 u[IU]/mL (ref 0.35–5.50)

## 2021-05-18 LAB — C-REACTIVE PROTEIN: CRP: <1 mg/dL (ref 0.5–20.0)

## 2021-05-18 LAB — CK: Total CK: 111 U/L (ref 7–177)

## 2021-05-18 LAB — SEDIMENTATION RATE: Sed Rate: 8 mm/hr (ref 0–30)

## 2021-05-18 MED ORDER — PREDNISONE 50 MG PO TABS
50.0000 mg | ORAL_TABLET | Freq: Every day | ORAL | 0 refills | Status: DC
  Filled 2021-05-18: qty 5, 5d supply, fill #0

## 2021-05-18 NOTE — Telephone Encounter (Signed)
Could be menopausal  or other causes   Do a tmeperature log   Take  oral temp twice a day  for 10 - 14 days to make sure not having fever.  Record.   Then check back with me   can do visit  virtual or in person

## 2021-05-18 NOTE — Patient Instructions (Addendum)
Thank you for coming in today.   Please get an Xray today before you leave   Please get labs today before you leave   Take the prednsione for 5 days.   Use the splint as needed.   I've referred you to Physical Therapy.  Let us know if you don't hear from them in one week.   Recheck in 1 month.   Let me know sooner if this is not working.   The mass in the shoulder looks like a Lipoma to me.  OK to watch it if is not growing rapidly or bothering you too much. It if is causing a problem next step is MRI for surgery planning. Let me know.   Let me know if you need a special note for work or school for an accomodation etc. The more specific you can be which what you need the better.   Let me know if you have a problem or a question.  Mychart works well.   I will be away from the office the week or July 25th but my partner will be here for emergencies.

## 2021-05-21 NOTE — Progress Notes (Signed)
Labs look normal so far.  More labs are still pending

## 2021-05-22 LAB — LUPUS(12) PANEL
Anti Nuclear Antibody (ANA): POSITIVE — AB
C3 Complement: 116 mg/dL (ref 83–193)
C4 Complement: 26 mg/dL (ref 15–57)
ENA SM Ab Ser-aCnc: <1 AI
Rheumatoid fact SerPl-aCnc: <14 IU/mL (ref <14)
Ribosomal P Protein Ab: <1 AI
SM/RNP: <1 AI
SSA (Ro) (ENA) Antibody, IgG: <1 AI
SSB (La) (ENA) Antibody, IgG: <1 AI
Scleroderma (Scl-70) (ENA) Antibody, IgG: <1 AI
Thyroperoxidase Ab SerPl-aCnc: <1 IU/mL (ref <9)
ds DNA Ab: 1 IU/mL

## 2021-05-22 LAB — ANTI-NUCLEAR AB-TITER (ANA TITER): ANA Titer 1: 1:1280 {titer} — ABNORMAL HIGH

## 2021-05-22 NOTE — Addendum Note (Signed)
Addended by: Gregor Hams on: 05/22/2021 12:14 PM   Modules accepted: Orders

## 2021-05-22 NOTE — Progress Notes (Signed)
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

## 2021-05-22 NOTE — Progress Notes (Signed)
Left shoulder x-ray looks normal to radiology

## 2021-05-22 NOTE — Progress Notes (Signed)
Other labs are back.  The only lab that came back significantly positive was the ANA which is significantly positive.  However the specific labs for lupus are negative.  I do not know what this means.  But this lab is significantly positive enough that I do think it is worthwhile for you to have a consultation with a rheumatologist.  I have already placed a referral.  You should hear soon from [facility] rheumatology.  It is entirely possible this lab does not mean anything but it is worth a look.

## 2021-05-24 ENCOUNTER — Other Ambulatory Visit (HOSPITAL_COMMUNITY): Payer: Self-pay

## 2021-05-24 ENCOUNTER — Telehealth: Payer: Self-pay | Admitting: Physical Therapy

## 2021-05-24 NOTE — Telephone Encounter (Signed)
Called pt to relay her lab results and pt verbalizes understanding.  She asks if she can get something for the pain in her R wrist which can be prescribed to [facility].  She states that her wrist is bothering her so much that she has stopped school but con't to work and notes that this is significant in that she will be able to take medication more easily due to not having to do schoolwork once she gets off of work.  Please advise.

## 2021-05-25 ENCOUNTER — Other Ambulatory Visit (HOSPITAL_COMMUNITY): Payer: Self-pay

## 2021-05-25 MED ORDER — TRAMADOL HCL 50 MG PO TABS
50.0000 mg | ORAL_TABLET | Freq: Three times a day (TID) | ORAL | 0 refills | Status: DC | PRN
  Filled 2021-05-25: qty 15, 5d supply, fill #0

## 2021-05-25 NOTE — Telephone Encounter (Signed)
Tramadol prescribed.  Tramadol is an opiate pain medication that should help.

## 2021-05-25 NOTE — Addendum Note (Signed)
Addended by: Gregor Hams on: 05/25/2021 07:10 AM   Modules accepted: Orders

## 2021-05-29 ENCOUNTER — Encounter: Payer: Self-pay | Admitting: Family Medicine

## 2021-05-29 DIAGNOSIS — M2550 Pain in unspecified joint: Secondary | ICD-10-CM | POA: Diagnosis not present

## 2021-05-29 DIAGNOSIS — R768 Other specified abnormal immunological findings in serum: Secondary | ICD-10-CM | POA: Diagnosis not present

## 2021-05-29 DIAGNOSIS — M25512 Pain in left shoulder: Secondary | ICD-10-CM | POA: Diagnosis not present

## 2021-05-29 DIAGNOSIS — M1990 Unspecified osteoarthritis, unspecified site: Secondary | ICD-10-CM | POA: Diagnosis not present

## 2021-06-01 ENCOUNTER — Ambulatory Visit (INDEPENDENT_AMBULATORY_CARE_PROVIDER_SITE_OTHER): Payer: BC Managed Care – PPO | Admitting: Internal Medicine

## 2021-06-01 ENCOUNTER — Encounter: Payer: Self-pay | Admitting: Internal Medicine

## 2021-06-01 ENCOUNTER — Other Ambulatory Visit (HOSPITAL_COMMUNITY): Payer: Self-pay

## 2021-06-01 ENCOUNTER — Ambulatory Visit
Admission: RE | Admit: 2021-06-01 | Discharge: 2021-06-01 | Disposition: A | Discharge status: Home / Self Care | Payer: BC Managed Care – PPO | Source: Ambulatory Visit

## 2021-06-01 ENCOUNTER — Other Ambulatory Visit: Payer: Self-pay | Admitting: Internal Medicine

## 2021-06-01 ENCOUNTER — Other Ambulatory Visit: Payer: Self-pay

## 2021-06-01 DIAGNOSIS — Z1231 Encounter for screening mammogram for malignant neoplasm of breast: Secondary | ICD-10-CM | POA: Diagnosis not present

## 2021-06-01 DIAGNOSIS — J45991 Cough variant asthma: Secondary | ICD-10-CM | POA: Diagnosis not present

## 2021-06-01 MED ORDER — MOMETASONE FURO-FORMOTEROL FUM 100-5 MCG/ACT IN AERO
INHALATION_SPRAY | RESPIRATORY_TRACT | 11 refills | Status: DC
  Filled 2021-06-01 (×3): qty 13, 30d supply, fill #0

## 2021-06-01 MED ORDER — ALBUTEROL SULFATE (2.5 MG/3ML) 0.083% IN NEBU
2.5000 mg | INHALATION_SOLUTION | Freq: Four times a day (QID) | RESPIRATORY_TRACT | 12 refills | Status: DC | PRN
  Filled 2021-06-01: qty 90, 8d supply, fill #0
  Filled 2021-06-15: qty 180, 15d supply, fill #0

## 2021-06-01 MED ORDER — ALBUTEROL SULFATE HFA 108 (90 BASE) MCG/ACT IN AERS
2.0000 | INHALATION_SPRAY | RESPIRATORY_TRACT | 5 refills | Status: DC | PRN
  Filled 2021-06-01: qty 8.5, 16d supply, fill #0
  Filled 2021-06-15: qty 8.5, 25d supply, fill #0

## 2021-06-01 NOTE — Assessment & Plan Note (Addendum)
Onset 2010  - PFT's wnl 02/20/11  While on symbicort with improved symptom control > f/u pulm prn - FENO 02/12/2018  =   61 on symb 80 2bid with poor hfa - Spirometry 02/12/2018  Nl   - 02/12/2018    continue symb 80 2bid and add max gerd rx  - Allergy profile 02/12/18  >  Eos 0.4 /  IgE  50  RAST Pos grass > dog  - 03/19/2018 try to taper off gerd rx > flared 05/2018 so resumed - FENO 05/26/2018  =   25 on symb 80 2bid  -  11/11/2018  After extensive coaching inhaler device,  effectiveness =    90%  All goals of chronic asthma control met including optimal function and elimination of symptoms with minimal need for rescue therapy on symbicort 80 2bid which is basically the same as dulera 100 > try this first.  Contingencies discussed in full including contacting this office immediately if not controlling the symptoms using the rule of two's.     Issue is formulary restriction Advised:  formulary restrictions will be an ongoing challenge for the forseable future and I would be happy to pick an alternative if the pt will first  provide me a list of them -  pt  will need to return here for training for any new device that is required eg dpi vs hfa vs respimat.       Each maintenance medication was reviewed in detail including emphasizing most importantly the difference between maintenance and prns and under what circumstances the prns are to be triggered using an action plan format where appropriate.  Total time for H and P, chart review, counseling, reviewing hfa device(s) and generating customized AVS unique to this office visit / same day charting = 22 min

## 2021-06-01 NOTE — Progress Notes (Signed)
Subjective:    Patient ID: [name], female    DOB: 03-Aug-1966,  .   MRN: [id number]    Brief patient profile:  55  yobf  Quit smoking 2012 with onset of variable sob 2010 but nl pfts while on symbicort 02/20/11    History of Present Illness  January 08, 2011 ov cc worse dyspnea, wheezing no cough x 2 weeks no better p inalers, feels something stuck in ssn but swallowing ok. general chest tightness not better with saba.  Patient Instructions:  1) Work on inhaler technique: relax and blow all the way out then take a nice smooth deep breath back in, triggering the inhaler at same time you start breathing in  2) Symbicort 2 puffs first thing in am and 2 puffs again in pm about 12 hours later  3) Change nexium Take one 30-60 min before first meal of the day and add pepcid 20 mg one at bedtime  4) GERD (REFLUX)          02/12/2018  f/u ov/Briea Mcenery re: ? Flare of asthma  Not even 50% better p er rx x 2 in Feb 2019  Chief Complaint  Patient presents with   Pulmonary Consult    Self referral.  She c/o SOB x 3 months "all the time"- with or without any exertion. She states when she lies down she hears "crackling noise".  She also c/o non prod cough- usually worse at night and can be triggered by cleaning products. She has been using proair 3-4 x per day, but none in the past wk.   Dyspnea:  Variably sob with exertion / prior to Nov 04 2017 was fine with good aerobic capacity consistently fast walk up hills off maint rx x years on otc nexium only bedtime and variably bad breath/ hb on this rx  Cough: 30 m to an hour p hs/ not as common later or early am /worse with exp to clearing products Sleep: most nights ok SABA use:  Way over using now where wasn't using anything prior to Nov 04 2017 and not helping rec Plan A = Automatic = Symbicort 80 Take 2 puffs first thing in am and then another 2 puffs about 12 hours later.  Work on inhaler technique:  Plan B = Backup Only use your  albuterol(Proventil/Proair/Ventolin)  as a rescue medication Protonix Take 30- 60 min before your first and last meals of the day  GERD diet   03/19/2018  f/u ov/Derek Huneycutt re:  S/p asthma flare, back in control  Chief Complaint  Patient presents with   Follow-up    Breathing is much improved and cough has resolved. She has only had to use her albuterol inhaler once since her last visit.    Dyspnea:  Ok including steps  Cough: no Sleep: ok  SABA use:  Only x one  rec Please schedule a follow up visit in 6  months but call sooner if needed  Continue the reflux diet Try off protonix and take over the counter  pepcid 20 mg after bfast and supper x 1 week then just after bfast x one week then off pepcid and if can't taper off then let your Primary care doctor know for purposes of refilling the protonix or taking nexium as needed    05/26/2018 acute extended ov/Bryannah Boston re: cough   Chief Complaint  Patient presents with   Acute Visit    asthma flare, cough, SOB, had to call the ambulance to her  job for an asthma attack , ran out of symbicort which may have caused asthma flare, just started protonix again  tapered off the protonix and maintained on pepcid but started cough/ rattling breathing w/in a week of the change and doe x steps despite maint on symb 80 2bid so refilled ppi bid ac as of 05/18/18 and yet no improvement initially/ some better on day of ov until starts dry  coughing fits Had improved to not needing any albuterol prior to above flare  Since onset of flare having trouble lying flat so props up on 5-6 pillows with freq noct awakening only some better p saba.  rec Plan A = Automatic = Symbicort 80 Take 2 puffs first thing in am and then another 2 puffs about 12 hours later.  Continue protonix 40 mg Take 30- 60 min before your first and last meals of the day  Plan B = Backup Only use your albuterol as a rescue medication Take delsym two tsp every 12 hours and supplement if needed with   tramadol 50 mg up to 2 every 4 hours Once you have eliminated the cough for 3 straight days try reducing the tramadol first,  then the delsym as tolerated.   For drainage / throat tickle try take CHLORPHENIRAMINE  4 mg -  Please schedule a follow up office visit in 4 weeks, call sooner if needed with all medications /inhalers/ solutions in hand  >  /22/19  NP eval/rx med calendar     11/11/2018  f/u ov/Cherolyn Behrle re: cough flared off ppi  Chief Complaint  Patient presents with   Follow-up    Breathing is doing well. She rarely uses her albuterol inhaler.   Dyspnea:  Not limited by breathing from desired activities   Cough: none Sleeping: fine flat SABA use: very rare 02: none rec Change to Pantoprazole (protonix) 40 mg   Take  30-60 min before first meal of the day and Pepcid (famotidine)  20 mg one after supper x 2 weeks then stop protonix and take pepcid 20 mg after bfast and supper x 2 weeks then just after supper x 2 weeks then stop and call if cough or breathing get worse with taper     05/12/2019  f/u ov/Lezly Rumpf re: flare of cough vaiant asthma = cough chest heavy on Pepcid  40 mg one at bedtime  Chief Complaint  Patient presents with   Follow-up    Had episode of increased SOB approx 2 wks ago- went to [facility] and given neb to take home. She used this every night for approx 1 wk to help with heaviness in her chest.   Dyspnea:  Back to rapid walking up 4 miles per day p prednsione/ h2 bid  Cough: resolved p prednisone  Sleeping: on slt hob elevation SABA use: none now  02: none rec For flares of breathing / coughing > protonix 40 mg Take 30-60 min before first meal of the day until 100% better x 2 weeks then ok to take as needed Continue pepcid 40 mg after supper daily or an hour before bed which is easier Plan A = Automatic = Symbicort 80 Take 2 puffs first thing in am and then another 2 puffs about 12 hours later.  Plan B = Backup Only use your albuterol inhaler as a rescue  medication Plan C = Crisis - only use your albuterol nebulizer if you first try Plan B   09/08/2019  f/u ov/Vince Ainsley re: cough variant asthma Chief  Complaint  Patient presents with   Follow-up    Unable to afford symbicort. She called [employer] and they told her to send in a letter.   Dyspnea:  Slowed by  Back pain rad down leg on R  Cough: gone Sleeping: no resp symptoms with hob up on bed blocks SABA use: min need as long as on symb 80 2bid 02: no Rec Ok to use symbicort 160 one twice daily until you get a supply of the symbicort 80  Which is ok to adjust so you're taking up to 2 pffs every 12 hours  - in event of a flare 2 puffs every 12 hours for at least a week before you taper it.   06/01/2021  f/u ov/Michella Detjen re: cough variant asthma/ problems with accessing meds / already failed advair  Chief Complaint  Patient presents with   Follow-up    Patient is here for a follow up and wants to talk about her symbicort.    Dyspnea:  Not limited by breathing from desired activities   Cough: no Sleeping:  SABA use:  none 02: none  Covid status:   none / says  had 3  covid infections and declines vaccination   No obvious day to day or daytime variability or assoc excess/ purulent sputum or mucus plugs or hemoptysis or cp or chest tightness, subjective wheeze or overt sinus or hb symptoms.   Sleeping  without nocturnal  or early am exacerbation  of respiratory  c/o's or need for noct saba. Also denies any obvious fluctuation of symptoms with weather or environmental changes or other aggravating or alleviating factors except as outlined above   No unusual exposure hx or h/o childhood pna/ asthma or knowledge of premature birth.  Current Allergies, Complete Past Medical History, Past Surgical History, Family History, and Social History were reviewed in [employer] record.  ROS  The following are not active complaints unless bolded Hoarseness, sore throat, dysphagia,  dental problems, itching, sneezing,  nasal congestion or discharge of excess mucus or purulent secretions, ear ache,   fever, chills, sweats, unintended wt loss or wt gain, classically pleuritic or exertional cp,  orthopnea pnd or arm/hand swelling  or leg swelling, presyncope, palpitations, abdominal pain, anorexia, nausea, vomiting, diarrhea  or change in bowel habits or change in bladder habits, change in stools or change in urine, dysuria, hematuria,  rash, arthralgias, visual complaints, headache, numbness, weakness or ataxia or problems with walking or coordination,  change in mood or  memory.        Current Meds  Medication Sig   albuterol (PROVENTIL) (2.5 MG/3ML) 0.083% nebulizer solution Take 3 mLs (2.5 mg total) by nebulization every 6 (six) hours as needed for wheezing or shortness of breath.   albuterol (VENTOLIN HFA) 108 (90 Base) MCG/ACT inhaler Inhale 2 puffs into the lungs every 4 (four) hours as needed for wheezing or shortness of breath.   budesonide-formoterol (SYMBICORT) 80-4.5 MCG/ACT inhaler INHALE 2 PUFFS BY MOUTH INTO THE LUNGS TWICE DAILY   EPINEPHRINE 0.3 mg/0.3 mL IJ SOAJ injection INJECT 1 SYRINGE INTO THE MUSCLE AS NEEDED FOR ANAPHYLAXIS (Patient taking differently: Inject 0.3 mg into the muscle as needed for anaphylaxis.)   famotidine (PEPCID) 20 MG tablet One at bedtime (Patient taking differently: Take 20 mg by mouth at bedtime. One at bedtime)   fluticasone (FLONASE) 50 MCG/ACT nasal spray USE 2 SPRAYS IN EACH NOSTRIL DAILY   hydrochlorothiazide (HYDRODIURIL) 25 MG tablet TAKE ONE TABLET BY  MOUTH ONCE DAILY   ibuprofen (ADVIL) 200 MG tablet Take 400 mg by mouth every 6 (six) hours as needed for moderate pain.   methocarbamol (ROBAXIN) 500 MG tablet Take 1 tablet (500 mg total) by mouth every 8 (eight) hours as needed for muscle spasms.   Multiple Vitamins-Minerals (ZINC PO) Take 1 tablet by mouth daily.   ofloxacin (FLOXIN) 0.3 % OTIC solution Place 10 drops into the  right ear daily.   ondansetron (ZOFRAN ODT) 4 MG disintegrating tablet Take 1 tablet (4 mg total) by mouth every 8 (eight) hours as needed for nausea or vomiting.   pantoprazole (PROTONIX) 40 MG tablet TAKE ONE TABLET BY MOUTH TWICE DAILY BEFORE MEALS. TAKE 30 TO 60 MINUTES BEFORE FIRST MEAL OF THE DAY   predniSONE (DELTASONE) 50 MG tablet Take 1 tablet (50 mg total) by mouth daily.   traMADol (ULTRAM) 50 MG tablet Take 1 tablet by mouth every 8 hours as needed for severe pain.   valACYclovir (VALTREX) 1000 MG tablet Take 1 tablet (1,000 mg total) by mouth 3 (three) times daily. For shingles   valsartan (DIOVAN) 320 MG tablet TAKE 1/2 TABLET BY MOUTH ONCE DAILY (Patient taking differently: 160 mg.)   VITAMIN D PO Take 1 capsule by mouth daily.                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 Quit 12/12/10. Smoked for approx 10 yrs up to 1/4 ppd  Alcohol use-yes  [employer]               Objective:   Physical Exam     06/01/2021         145  09/08/2019        146   05/12/19 150 lb (68 kg)  04/27/19 132 lb (59.9 kg)  11/11/18 141 lb (64 kg)     Vital signs reviewed  06/01/2021  - Note at rest 02 sats  97% on RA   General appearance:    healthy amb bf nad     HEENT : pt wearing mask not removed for exam due to covid -19 concerns.    NECK :  without JVD/Nodes/TM/ nl carotid upstrokes bilaterally   LUNGS: no acc muscle use,  Nl contour chest which is clear to A and P bilaterally without cough on insp or exp maneuvers   CV:  RRR  no s3 or murmur or increase in P2, and no edema   ABD:  soft and nontender with nl inspiratory excursion  in the supine position. No bruits or organomegaly appreciated, bowel sounds nl  MS:  Nl gait/ ext warm without deformities, calf tenderness, cyanosis or clubbing No obvious joint restrictions   SKIN: warm and dry without lesions    NEURO:  alert, approp, nl sensorium with  no motor or cerebellar deficits apparent.       Assessment & Plan:

## 2021-06-01 NOTE — Patient Instructions (Addendum)
Plan A = Automatic = Always=    Dulera 100 Take 2 puffs first thing in am and then another 2 puffs about 12 hours later.     Plan B = Backup (to supplement plan A, not to replace it) Only use your albuterol inhaler (proair) as a rescue medication to be used if you can't catch your breath by resting or doing a relaxed purse lip breathing pattern.  - The less you use it, the better it will work when you need it. - Ok to use the inhaler up to 2 puffs  every 4 hours if you must but call for appointment if use goes up over your usual need - Don't leave home without it !!  (think of it like the spare tire for your car)   Plan C = Crisis (instead of Plan B but only if Plan B stops working) - only use your albuterol nebulizer if you first try Plan B and it fails to help > ok to use the nebulizer up to every 4 hours but if start needing it regularly call for immediate appointment  Please schedule a follow up visit in 12  months but call sooner if needed

## 2021-06-03 NOTE — Progress Notes (Signed)
Chief Complaint  Patient presents with   Annual Exam     HPI: Patient  [name]  55 y.o. comes in today for [place] visit and medication check She feels she is fully recovered from [place].  Of note she has had 3 episodes.  Most recently the whole family in [place] fully immunized got COVID as well as her. She is under treatment by pulmonary for cough variant asthma and her inhalers were changed for insurance reasons toDulera   and provenil change  She is under evaluation by rheumatologist because of tendinitis and other symptoms with a positive ANA. Had lab works done recently. Blood pressure no changes No current cough or active illness otherwise For the last month or so she is having night sweats that are somewhat drenching but no chills no fever no unintentional weight loss She has had a partial hysterectomy and did have some hot flashes in the past but she feels that this is different.  Health Maintenance  Topic Date Due   Pneumococcal Vaccine 1-74 Years old (1 - PCV) Never done   TETANUS/TDAP  Never done   Zoster Vaccines- Shingrix (1 of 2) Never done   PAP SMEAR-Modifier  Never done   MAMMOGRAM  Never done   COLONOSCOPY (Pts 45-42yr Insurance coverage will need to be confirmed)  02/26/2021   INFLUENZA VACCINE  06/04/2021   COVID-19 Vaccine (1) 06/20/2021 (Originally 01/14/1971)   Hepatitis C Screening  01/17/2022 (Originally 01/13/1984)   HIV Screening  Completed   HPV VACCINES  Aged Out   Health Maintenance Review LIFESTYLE:  Exercise:   Tobacco/ETS: no  since quite Alcohol:   no Sugar beverages: Sleep: 4.5   Drug use: no HH of  2  1 dog  Work: normally 40 - 45 hours   ROS:  GEN/ HEENT: No fever, significant weight changes sweats headaches vision problems hearing changes, CV/ PULM; No chest pain shortness of breath cough, syncope,edema  change in exercise tolerance. GI /GU: No adominal pain, vomiting, change in bowel habits. No blood in the  stool. No significant GU symptoms. SKIN/HEME: ,no acute skin rashes suspicious lesions or bleeding. No lymphadenopathy, nodules, masses.  NEURO/ PSYCH:  No neurologic signs such as weakness numbness. No depression anxiety. IMM/ Allergy: No unusual infections.  Allergy .   REST of 12 system review negative except as per HPI   Past Medical History:  Diagnosis Date   Allergic reaction caused by a drug 01/25/2013   probably.    Allergy    Anemia    Asthma    GERD (gastroesophageal reflux disease)    Heart murmur    Hypertension    Positive TB test    Shingles    TOBACCO USE 03/22/2009   Qualifier: Diagnosis of  By: PRegis BillMD, WStandley Brooking Stopped Dec 12 2010      Past Surgical History:  Procedure Laterality Date   ABDOMINAL HYSTERECTOMY     BREAST SURGERY     cyst from left breast   BUNIONECTOMY     both feet   CHOLECYSTECTOMY     CYSTECTOMY     left shoulder   KNEE SURGERY Left    PANENDOSCOPY     TUBAL LIGATION     uterine ablastion     and polyps removed   Uterine polyps      Family History  Problem Relation Age of Onset   Hypertension Mother    Alzheimer's disease Mother  Hypertension Father    Prostate cancer Father    HIV Brother    Lupus Sister    Colon cancer Paternal Grandmother    Esophageal cancer Neg Hx    Liver cancer Neg Hx    Pancreatic cancer Neg Hx    Rectal cancer Neg Hx    Stomach cancer Neg Hx     Social History   Socioeconomic History   Marital status: Divorced    Spouse name: Not on file   Number of children: Not on file   Years of education: Not on file   Highest education level: Not on file  Occupational History   Not on file  Tobacco Use   Smoking status: Former    Packs/day: 0.50    Years: 17.00    Pack years: 8.50    Types: Cigarettes    Quit date: 12/12/2010    Years since quitting: 10.4   Smokeless tobacco: Never  Vaping Use   Vaping Use: Never used  Substance and Sexual Activity   Alcohol use: No   Drug use: No    Sexual activity: Not on file  Other Topics Concern   Not on file  Social History Narrative   Divorced    Tobacco since age 74 stopped feb 2012   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 125 mL 12   albuterol (VENTOLIN HFA) 108 (90 Base) MCG/ACT inhaler Inhale 2 puffs into the lungs every 4 (four) hours as needed. 8.5 g 5   EPINEPHRINE 0.3 mg/0.3 mL IJ SOAJ injection INJECT 1 SYRINGE INTO THE MUSCLE AS NEEDED FOR ANAPHYLAXIS (Patient taking differently: Inject 0.3 mg into the muscle as needed for anaphylaxis.) 2 each 1   famotidine (PEPCID) 20 MG tablet One at bedtime (Patient taking differently: Take 20 mg by mouth at bedtime. One at bedtime) 60 tablet 11   fluticasone (FLONASE) 50 MCG/ACT nasal spray USE 2 SPRAYS IN EACH NOSTRIL DAILY 48 g 2   hydrochlorothiazide (HYDRODIURIL) 25 MG tablet TAKE ONE TABLET BY MOUTH ONCE DAILY 30 tablet 4   ibuprofen (ADVIL) 200 MG tablet Take 400 mg by mouth every 6 (six) hours as needed for moderate pain.     methocarbamol (ROBAXIN) 500 MG tablet Take 1 tablet (500 mg total) by mouth every 8 (eight) hours as needed for muscle spasms. 10 tablet 0   mometasone-formoterol (DULERA) 100-5 MCG/ACT AERO Take 2 puffs first thing in  the morning and then another 2 puffs about 12 hours later. 13 g 11   Multiple Vitamins-Minerals (ZINC PO) Take 1 tablet by mouth daily.     ofloxacin (FLOXIN) 0.3 % OTIC solution Place 10 drops into the right ear daily.  10 mL 0   ondansetron (ZOFRAN ODT) 4 MG disintegrating tablet Take 1 tablet (4 mg total) by mouth every 8 (eight) hours as needed for nausea or vomiting. 20 tablet 0   pantoprazole (PROTONIX) 40 MG tablet TAKE ONE TABLET BY  MOUTH TWICE DAILY BEFORE MEALS. TAKE 30 TO 60 MINUTES BEFORE FIRST MEAL OF THE DAY 60 tablet 0   predniSONE (DELTASONE) 50 MG tablet Take 1 tablet (50 mg total) by mouth daily. 5 tablet 0   traMADol (ULTRAM) 50 MG tablet Take 1 tablet by mouth every 8 hours as needed for severe pain. 15 tablet 0   valACYclovir (VALTREX) 1000 MG tablet Take 1 tablet (1,000 mg total) by mouth 3 (three) times daily. For shingles 21 tablet 0   valsartan (DIOVAN) 320 MG tablet TAKE 1/2 TABLET BY MOUTH ONCE DAILY (Patient taking differently: 160 mg.) 30 tablet 2   VITAMIN D PO Take 1 capsule by mouth daily.     No facility-administered medications prior to visit.     EXAM:  BP 124/86 (BP Location: Left Arm, Patient Position: Sitting, Cuff Size: Normal)   Pulse 81   Temp 98.1 F (36.7 C) (Oral)   Ht 5' (1.524 m)   Wt 141 lb 6.4 oz (64.1 kg)   SpO2 97%   BMI 27.62 kg/m   Body mass index is 27.62 kg/m. Wt Readings from Last 3 Encounters:  06/04/21 141 lb 6.4 oz (64.1 kg)  06/01/21 145 lb 3.2 oz (65.9 kg)  05/18/21 146 lb 9.6 oz (66.5 kg)    Physical Exam: Vital signs reviewed [id number] is a well-developed well-nourished alert cooperative    who appearsr stated age in no acute distress.  HEENT: normocephalic atraumatic , Eyes: PERRL EOM's full, conjunctiva clear, Nares: paten,t no deformity discharge or tenderness., Ears: no deformity EAC's clear TMs with normal landmarks. Mouth: masked NECK: supple without masses, thyromegaly or bruits.  Thyroid easily palpable CHEST/PULM:  Clear to auscultation and percussion breath sounds equal no wheeze , rales or rhonchi. No chest wall deformities or tenderness. Breast: normal by inspection . No dimpling, discharge, masses, tenderness or discharge  . CV: PMI is nondisplaced, S1 S2 no gallops, murmurs, rubs. Peripheral pulses are full without delay.No JVD .  ABDOMEN: Bowel sounds normal nontender  No guard or rebound, no hepato splenomegal no CVA tenderness.   Extremtities:  No clubbing cyanosis or edema, no acute joint swelling right wrist is in a support wrap NEURO:  Oriented x3, cranial nerves 3-12 appear to be intact, no obvious focal weakness,gait within normal limits no abnormal reflexes or asymmetrical SKIN: No acute rashes normal turgor, color, no bruising or petechiae. PSYCH: Oriented, good eye contact, no obvious depression anxiety, cognition and judgment appear normal. LN: no cervical axillary inguinal adenopathy  Lab Results  Component Value Date   WBC 4.8 05/18/2021   HGB 12.6 05/18/2021   HCT 38.1 05/18/2021   PLT 324.0 05/18/2021   GLUCOSE 90 05/18/2021   CHOL 195 05/19/2018   TRIG 41.0 05/19/2018   HDL 83.80 05/19/2018   LDLCALC 103 (H) 05/19/2018   ALT 9 05/18/2021   AST 15 05/18/2021   NA 139 05/18/2021   K 3.6 05/18/2021   CL 104 05/18/2021   CREATININE 0.94 05/18/2021   BUN 16 05/18/2021   CO2 28 05/18/2021   TSH 0.97 05/18/2021   INR 1.0 01/17/2021   MICROALBUR 0.7 05/30/2009    BP Readings from Last 3 Encounters:  06/04/21 124/86  06/01/21 126/80  05/18/21 (!) 158/100      ASSESSMENT AND PLAN:  Discussed the following assessment and plan:    ICD-10-CM   1. Visit for preventive health examination  Z00.00     2. Medication management  [phone]  3. Essential hypertension  I10     4. History of 2019 novel coronavirus disease (COVID-19)  Z86.16     5. Night sweats vs menopausal sx   R61    hx po ppd but no resp sx or unintentional weight loss    6. ANA positive  R76.8     Blood pressure control feels that she is past COVID and acute illness otherwise Will monitor the night symptoms last chest x-ray in the fall of last year was normal if persistent progressive new symptoms come back  for reevaluation Return for depending on how  doing or  12 months  .  Patient Care Team: Burnis Medin, MD as PCP - General Patient Instructions  Good to see  you today .  The night sweats could be from  menopause since no fever  ? Autoimmune  issues. No sign infection at this time.  Will follow  for now.   Bp is in control today.  Last colon was done 2019  and to repeat in 3-5 years.  Health Maintenance, Female Adopting a healthy lifestyle and getting preventive care are important in promoting health and wellness. Ask your health care provider about: The right schedule for you to have regular tests and exams. Things you can do on your own to prevent diseases and keep yourself healthy. What should I know about diet, weight, and exercise? Eat a healthy diet  Eat a diet that includes plenty of vegetables, fruits, low-fat dairy products, and lean protein. Do not eat a lot of foods that are high in solid fats, added sugars, or sodium.  Maintain a healthy weight Body mass index (BMI) is used to identify weight problems. It estimates body fat based on height and weight. Your health care provider can help determineyour BMI and help you achieve or maintain a healthy weight. Get regular exercise Get regular exercise. This is one of the most important things you can do for your health. Most adults should: Exercise for at least 150 minutes each week. The exercise should increase your heart rate and make you sweat (moderate-intensity exercise). Do strengthening exercises at least twice a week. This is in addition to the moderate-intensity exercise. Spend less time sitting. Even light physical activity can be beneficial. Watch cholesterol and blood lipids Have your blood tested for lipids and cholesterol at 54 years of age, then havethis test every 5 years. Have your cholesterol levels checked more often if: Your lipid or cholesterol levels are high. You are older than 54 years of age. You are  at high risk for heart disease. What should I know about cancer screening? Depending on your health history and family history, you may need to have cancer screening at various ages. This may include screening for: Breast cancer. Cervical cancer. Colorectal cancer. Skin cancer. Lung cancer. What should I know about heart disease, diabetes, and high blood pressure? Blood pressure and heart disease High blood pressure causes heart disease and increases the risk of stroke. This is more likely to develop in people who have high blood pressure readings, are of African descent, or are overweight. Have your blood pressure checked: Every 3-5 years if you are 32-10 years of age. Every year if you are 80 years old or older. Diabetes Have regular diabetes screenings. This checks your fasting blood sugar level. Have the screening done: Once every three years after age 26 if you are at a normal weight and have a low risk for diabetes. More often and  at a younger age if you are overweight or have a high risk for diabetes. What should I know about preventing infection? Hepatitis B If you have a higher risk for hepatitis B, you should be screened for this virus. Talk with your health care provider to find out if you are at risk forhepatitis B infection. Hepatitis C Testing is recommended for: Everyone born from [birth year] through 1964. Anyone with known risk factors for hepatitis C. Sexually transmitted infections (STIs) Get screened for STIs, including gonorrhea and chlamydia, if: You are sexually active and are younger than 54 years of age. You are older than 54 years of age and your health care provider tells you that you are at risk for this type of infection. Your sexual activity has changed since you were last screened, and you are at increased risk for chlamydia or gonorrhea. Ask your health care provider if you are at risk. Ask your health care provider about whether you are at high risk for HIV.  Your health care provider may recommend a prescription medicine to help prevent HIV infection. If you choose to take medicine to prevent HIV, you should first get tested for HIV. You should then be tested every 3 months for as long as you are taking the medicine. Pregnancy If you are about to stop having your period (premenopausal) and you may become pregnant, seek counseling before you get pregnant. Take 400 to 800 micrograms (mcg) of folic acid every day if you become pregnant. Ask for birth control (contraception) if you want to prevent pregnancy. Osteoporosis and menopause Osteoporosis is a disease in which the bones lose minerals and strength with aging. This can result in bone fractures. If you are 46 years old or older, or if you are at risk for osteoporosis and fractures, ask your health care provider if you should: Be screened for bone loss. Take a calcium or vitamin D supplement to lower your risk of fractures. Be given hormone replacement therapy (HRT) to treat symptoms of menopause. Follow these instructions at home: Lifestyle Do not use any products that contain nicotine or tobacco, such as cigarettes, e-cigarettes, and chewing tobacco. If you need help quitting, ask your health care provider. Do not use street drugs. Do not share needles. Ask your health care provider for help if you need support or information about quitting drugs. Alcohol use Do not drink alcohol if: Your health care provider tells you not to drink. You are pregnant, may be pregnant, or are planning to become pregnant. If you drink alcohol: Limit how much you use to 0-1 drink a day. Limit intake if you are breastfeeding. Be aware of how much alcohol is in your drink. In the [place] one drink equals one 12 oz bottle of beer (355 mL), one 5 oz glass of wine (148 mL), or one 1 oz glass of hard liquor (44 mL). General instructions Schedule regular health, dental, and eye exams. Stay current with your  vaccines. Tell your health care provider if: You often feel depressed. You have ever been abused or do not feel safe at home. Summary Adopting a healthy lifestyle and getting preventive care are important in promoting health and wellness. Follow your health care provider's instructions about healthy diet, exercising, and getting tested or screened for diseases. Follow your health care provider's instructions on monitoring your cholesterol and blood pressure. This information is not intended to replace advice given to you by your health care provider. Make sure you discuss any questions you have  with your healthcare provider. Document Revised: 10/14/2018 Document Reviewed: 10/14/2018 Elsevier Patient Education  2022 [place]. Contina Strain M.D.

## 2021-06-04 ENCOUNTER — Ambulatory Visit (INDEPENDENT_AMBULATORY_CARE_PROVIDER_SITE_OTHER): Payer: BC Managed Care – PPO | Admitting: Internal Medicine

## 2021-06-04 ENCOUNTER — Encounter: Payer: Self-pay | Admitting: Family Medicine

## 2021-06-04 ENCOUNTER — Encounter: Payer: Self-pay | Admitting: Internal Medicine

## 2021-06-04 ENCOUNTER — Other Ambulatory Visit (HOSPITAL_COMMUNITY): Payer: Self-pay

## 2021-06-04 ENCOUNTER — Other Ambulatory Visit: Payer: Self-pay

## 2021-06-04 VITALS — BP 124/86 | HR 81 | Temp 98.1°F | Ht 60.0 in | Wt 141.4 lb

## 2021-06-04 DIAGNOSIS — M25512 Pain in left shoulder: Secondary | ICD-10-CM | POA: Diagnosis not present

## 2021-06-04 DIAGNOSIS — R61 Generalized hyperhidrosis: Secondary | ICD-10-CM

## 2021-06-04 DIAGNOSIS — I10 Essential (primary) hypertension: Secondary | ICD-10-CM | POA: Diagnosis not present

## 2021-06-04 DIAGNOSIS — Z8616 Personal history of COVID-19: Secondary | ICD-10-CM | POA: Diagnosis not present

## 2021-06-04 DIAGNOSIS — Z79899 Other long term (current) drug therapy: Secondary | ICD-10-CM

## 2021-06-04 DIAGNOSIS — R2689 Other abnormalities of gait and mobility: Secondary | ICD-10-CM | POA: Diagnosis not present

## 2021-06-04 DIAGNOSIS — M25531 Pain in right wrist: Secondary | ICD-10-CM | POA: Diagnosis not present

## 2021-06-04 DIAGNOSIS — Z0000 Encounter for general adult medical examination without abnormal findings: Secondary | ICD-10-CM

## 2021-06-04 DIAGNOSIS — R768 Other specified abnormal immunological findings in serum: Secondary | ICD-10-CM

## 2021-06-04 NOTE — Patient Instructions (Addendum)
Good to see  you today .  The night sweats could be from  menopause since no fever  ? Autoimmune  issues. No sign infection at this time.  Will follow  for now.   Bp is in control today.  Last colon was done 2019  and to repeat in 3-5 years.  Health Maintenance, Female Adopting a healthy lifestyle and getting preventive care are important in promoting health and wellness. Ask your health care provider about: The right schedule for you to have regular tests and exams. Things you can do on your own to prevent diseases and keep yourself healthy. What should I know about diet, weight, and exercise? Eat a healthy diet  Eat a diet that includes plenty of vegetables, fruits, low-fat dairy products, and lean protein. Do not eat a lot of foods that are high in solid fats, added sugars, or sodium.  Maintain a healthy weight Body mass index (BMI) is used to identify weight problems. It estimates body fat based on height and weight. Your health care provider can help determineyour BMI and help you achieve or maintain a healthy weight. Get regular exercise Get regular exercise. This is one of the most important things you can do for your health. Most adults should: Exercise for at least 150 minutes each week. The exercise should increase your heart rate and make you sweat (moderate-intensity exercise). Do strengthening exercises at least twice a week. This is in addition to the moderate-intensity exercise. Spend less time sitting. Even light physical activity can be beneficial. Watch cholesterol and blood lipids Have your blood tested for lipids and cholesterol at 54 years of age, then havethis test every 5 years. Have your cholesterol levels checked more often if: Your lipid or cholesterol levels are high. You are older than 54 years of age. You are at high risk for heart disease. What should I know about cancer screening? Depending on your health history and family history, you may need to have  cancer screening at various ages. This may include screening for: Breast cancer. Cervical cancer. Colorectal cancer. Skin cancer. Lung cancer. What should I know about heart disease, diabetes, and high blood pressure? Blood pressure and heart disease High blood pressure causes heart disease and increases the risk of stroke. This is more likely to develop in people who have high blood pressure readings, are of African descent, or are overweight. Have your blood pressure checked: Every 3-5 years if you are 56-16 years of age. Every year if you are 1 years old or older. Diabetes Have regular diabetes screenings. This checks your fasting blood sugar level. Have the screening done: Once every three years after age 35 if you are at a normal weight and have a low risk for diabetes. More often and at a younger age if you are overweight or have a high risk for diabetes. What should I know about preventing infection? Hepatitis B If you have a higher risk for hepatitis B, you should be screened for this virus. Talk with your health care provider to find out if you are at risk forhepatitis B infection. Hepatitis C Testing is recommended for: Everyone born from [birth year] through 1964. Anyone with known risk factors for hepatitis C. Sexually transmitted infections (STIs) Get screened for STIs, including gonorrhea and chlamydia, if: You are sexually active and are younger than 54 years of age. You are older than 54 years of age and your health care provider tells you that you are at risk for this type  of infection. Your sexual activity has changed since you were last screened, and you are at increased risk for chlamydia or gonorrhea. Ask your health care provider if you are at risk. Ask your health care provider about whether you are at high risk for HIV. Your health care provider may recommend a prescription medicine to help prevent HIV infection. If you choose to take medicine to prevent HIV, you should  first get tested for HIV. You should then be tested every 3 months for as long as you are taking the medicine. Pregnancy If you are about to stop having your period (premenopausal) and you may become pregnant, seek counseling before you get pregnant. Take 400 to 800 micrograms (mcg) of folic acid every day if you become pregnant. Ask for birth control (contraception) if you want to prevent pregnancy. Osteoporosis and menopause Osteoporosis is a disease in which the bones lose minerals and strength with aging. This can result in bone fractures. If you are 22 years old or older, or if you are at risk for osteoporosis and fractures, ask your health care provider if you should: Be screened for bone loss. Take a calcium or vitamin D supplement to lower your risk of fractures. Be given hormone replacement therapy (HRT) to treat symptoms of menopause. Follow these instructions at home: Lifestyle Do not use any products that contain nicotine or tobacco, such as cigarettes, e-cigarettes, and chewing tobacco. If you need help quitting, ask your health care provider. Do not use street drugs. Do not share needles. Ask your health care provider for help if you need support or information about quitting drugs. Alcohol use Do not drink alcohol if: Your health care provider tells you not to drink. You are pregnant, may be pregnant, or are planning to become pregnant. If you drink alcohol: Limit how much you use to 0-1 drink a day. Limit intake if you are breastfeeding. Be aware of how much alcohol is in your drink. In the [place] one drink equals one 12 oz bottle of beer (355 mL), one 5 oz glass of wine (148 mL), or one 1 oz glass of hard liquor (44 mL). General instructions Schedule regular health, dental, and eye exams. Stay current with your vaccines. Tell your health care provider if: You often feel depressed. You have ever been abused or do not feel safe at home. Summary Adopting a healthy  lifestyle and getting preventive care are important in promoting health and wellness. Follow your health care provider's instructions about healthy diet, exercising, and getting tested or screened for diseases. Follow your health care provider's instructions on monitoring your cholesterol and blood pressure. This information is not intended to replace advice given to you by your health care provider. Make sure you discuss any questions you have with your healthcare provider. Document Revised: 10/14/2018 Document Reviewed: 10/14/2018 Elsevier Patient Education  2022 [employer].

## 2021-06-07 DIAGNOSIS — M2550 Pain in unspecified joint: Secondary | ICD-10-CM | POA: Diagnosis not present

## 2021-06-07 DIAGNOSIS — R768 Other specified abnormal immunological findings in serum: Secondary | ICD-10-CM | POA: Diagnosis not present

## 2021-06-07 DIAGNOSIS — M25512 Pain in left shoulder: Secondary | ICD-10-CM | POA: Diagnosis not present

## 2021-06-07 DIAGNOSIS — M654 Radial styloid tenosynovitis [de Quervain]: Secondary | ICD-10-CM | POA: Diagnosis not present

## 2021-06-08 DIAGNOSIS — R2689 Other abnormalities of gait and mobility: Secondary | ICD-10-CM | POA: Diagnosis not present

## 2021-06-08 DIAGNOSIS — M25512 Pain in left shoulder: Secondary | ICD-10-CM | POA: Diagnosis not present

## 2021-06-08 DIAGNOSIS — M25531 Pain in right wrist: Secondary | ICD-10-CM | POA: Diagnosis not present

## 2021-06-09 ENCOUNTER — Other Ambulatory Visit (HOSPITAL_COMMUNITY): Payer: Self-pay

## 2021-06-12 DIAGNOSIS — M25512 Pain in left shoulder: Secondary | ICD-10-CM | POA: Diagnosis not present

## 2021-06-12 DIAGNOSIS — M25531 Pain in right wrist: Secondary | ICD-10-CM | POA: Diagnosis not present

## 2021-06-12 DIAGNOSIS — R2689 Other abnormalities of gait and mobility: Secondary | ICD-10-CM | POA: Diagnosis not present

## 2021-06-14 NOTE — Progress Notes (Signed)
I, Wendy Poet, LAT, ATC, am serving as scribe for Dr. [name].  [name] is a 55 y.o. 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  She was last seen by Dr. Georgina Snell on 05/18/21.  She was referred to PT at  [facility] and was prescribed a short course of prednisone.  She was also referred to rheumatology.  Since her last visit, pt reports that she is in constant pain in her R wrist and is now having pain in her L wrist as well.  She has completed 3 PT sessions but overall feels no improvement w/ PT.  PT will slightly improve her pain during the visit but then flares up her pain following the visit.  Pt has been to see Rheumatology and no auto-immune cause was found.  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  Diagnostic imaging: L shoulder and R wrist XR- 05/18/21  Pertinent review of systems: No fevers or chills  Relevant historical information: Pretension   Exam:  BP 120/82 (BP Location: Left Arm, Patient Position: Sitting, Cuff Size: Normal)   Pulse 65   Ht 5' (1.524 m)   Wt 146 lb 9.6 oz (66.5 kg)   SpO2 99%   BMI 28.63 kg/m  General: Well Developed, well nourished, and in no acute distress.   MSK: Right hand and wrist slight swelling at wrist.  Tender palpation radial styloid.  Tender palpation base of thumb.  Pain with wrist motion.    Lab and Radiology Results EXAM: RIGHT WRIST - COMPLETE 3+ VIEW   COMPARISON:  None.   FINDINGS: Advanced degenerative changes are noted at the first [facility] joints. There is subluxation of the joint laterally. No acute osseous abnormalities are present.   IMPRESSION: Advanced degenerative changes at the first [facility] joint.     Electronically Signed   By: San Morelle M.D.   On: 05/21/2021 10:36 I, [name], personally (independently)  visualized and performed the interpretation of the images attached in this note.      Assessment and Plan: 55 y.o. female with right wrist pain multifactorial.  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  Ideally would be performing steroid injection today at first dorsal wrist compartment and first [place].  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  We discussed some options.  Plan for oral steroid Dosepak and and reschedule with me in a few weeks when she does not have to work in the evening.  At that time if still needed will plan to inject the first dorsal wrist compartment and the first [facility] at the same time.     PDMP not reviewed this encounter. No orders of the defined types were placed in this encounter.  Meds ordered this encounter  Medications   predniSONE (STERAPRED UNI-PAK 48 TAB) 10 MG (48) TBPK tablet    Sig: Take as directed package instructions for 12 days    Dispense:  48 tablet    Refill:  0     Discussed warning signs or symptoms. Please see discharge instructions. Patient expresses understanding.   The above documentation has been reviewed and is accurate and complete [name], M.D.

## 2021-06-15 ENCOUNTER — Encounter: Payer: Self-pay | Admitting: Family Medicine

## 2021-06-15 ENCOUNTER — Ambulatory Visit (INDEPENDENT_AMBULATORY_CARE_PROVIDER_SITE_OTHER): Payer: BC Managed Care – PPO | Admitting: Family Medicine

## 2021-06-15 ENCOUNTER — Other Ambulatory Visit: Payer: Self-pay

## 2021-06-15 ENCOUNTER — Other Ambulatory Visit (HOSPITAL_COMMUNITY): Payer: Self-pay

## 2021-06-15 ENCOUNTER — Ambulatory Visit: Payer: Self-pay

## 2021-06-15 VITALS — BP 120/82 | HR 65 | Ht 60.0 in | Wt 146.6 lb

## 2021-06-15 DIAGNOSIS — M25531 Pain in right wrist: Secondary | ICD-10-CM

## 2021-06-15 DIAGNOSIS — M654 Radial styloid tenosynovitis [de Quervain]: Secondary | ICD-10-CM

## 2021-06-15 DIAGNOSIS — M25532 Pain in left wrist: Secondary | ICD-10-CM

## 2021-06-15 MED ORDER — PREDNISONE 10 MG (48) PO TBPK
ORAL_TABLET | Freq: Every day | ORAL | 0 refills | Status: DC
  Filled 2021-06-15: qty 48, 12d supply, fill #0

## 2021-06-15 NOTE — Patient Instructions (Addendum)
Thank you for coming in today.   Continue PT.   Return for injection when you dont have to work that evening.   If injection does not help I will get you set up for wrist MRI for possible surgical evaluation and planning.

## 2021-06-26 NOTE — Telephone Encounter (Signed)
Please refer her to l low [facility], they will do visits virtual.

## 2021-07-17 DIAGNOSIS — J45991 Cough variant asthma: Secondary | ICD-10-CM

## 2021-07-17 MED ORDER — ALBUTEROL SULFATE HFA 108 (90 BASE) MCG/ACT IN AERS: 2.0000 | INHALATION_SPRAY | RESPIRATORY_TRACT | 3 refills | Status: DC | PRN

## 2021-07-17 MED ORDER — MOMETASONE FURO-FORMOTEROL FUM 100-5 MCG/ACT IN AERO: INHALATION_SPRAY | RESPIRATORY_TRACT | 3 refills | Status: DC

## 2021-07-18 ENCOUNTER — Other Ambulatory Visit: Payer: Self-pay

## 2021-07-18 MED ORDER — VALSARTAN 320 MG PO TABS: 160.0000 mg | ORAL_TABLET | Freq: Every day | ORAL | 2 refills | Status: DC

## 2021-07-18 MED ORDER — HYDROCHLOROTHIAZIDE 25 MG PO TABS: 25.0000 mg | ORAL_TABLET | Freq: Every day | ORAL | 4 refills | Status: DC

## 2021-07-24 ENCOUNTER — Ambulatory Visit (INDEPENDENT_AMBULATORY_CARE_PROVIDER_SITE_OTHER): Payer: BC Managed Care – PPO | Admitting: Family Medicine

## 2021-07-24 ENCOUNTER — Encounter: Payer: Self-pay | Admitting: Family Medicine

## 2021-07-24 ENCOUNTER — Other Ambulatory Visit: Payer: Self-pay

## 2021-07-24 ENCOUNTER — Ambulatory Visit: Payer: Self-pay

## 2021-07-24 VITALS — BP 148/98 | HR 61 | Ht 60.0 in | Wt 143.6 lb

## 2021-07-24 DIAGNOSIS — M25512 Pain in left shoulder: Secondary | ICD-10-CM

## 2021-07-24 DIAGNOSIS — G8929 Other chronic pain: Secondary | ICD-10-CM

## 2021-07-24 DIAGNOSIS — D1722 Benign lipomatous neoplasm of skin and subcutaneous tissue of left arm: Secondary | ICD-10-CM | POA: Diagnosis not present

## 2021-07-24 DIAGNOSIS — M79644 Pain in right finger(s): Secondary | ICD-10-CM | POA: Diagnosis not present

## 2021-07-24 NOTE — Telephone Encounter (Signed)
So documentation letter should really be done by allergist or pulmonary , treating your asthma.   And documentation that you can have hyperreactive airways and certain aerosols and chemicals will trigger your asthma. It may not be the citrus smell it may be the chemical itself.  So even if there is a change in solution you could still react. It would be best for pulmonary or allergy team to document or right confirmation of your condition.in order to make correct workplace accomodation.

## 2021-07-24 NOTE — Progress Notes (Signed)
I, [name], LAT, ATC, am serving as scribe for Dr. [name].  [name] is a 55 y.o. female who presents to [place] at [facility] today for L shoulder pain. Pt was previously seen by Dr. Georgina Snell on 06/15/21 for R wrist pain due to deQuervain's. She completed a prior course of PT for her R wrist and L shoulder.  Today, pt reports L shoulder pain flared up about 1.5-2 weeks ago.  She locates the pain to the cyst/lump area at the L ant shoulder from her last visit that radiates into her L axilla and into her L post shoulder.  Aggravating factors include L sidelying, lifting and L shoulder aBd AROM. She is using Biofreeze and IBU.  Additionally she notes swelling at the right thumb associate with pain.  X-rays obtained at the last visit showed first [facility] DJD.   Pertinent review of systems: No fevers or chills  Relevant historical information: Prior lipoma removal left lateral shoulder.   Exam:  BP (!) 148/98 (BP Location: Right Arm, Patient Position: Sitting, Cuff Size: Normal)   Pulse 61   Ht 5' (1.524 m)   Wt 143 lb 9.6 oz (65.1 kg)   SpO2 98%   BMI 28.04 kg/m  General: Well Developed, well nourished, and in no acute distress.   MSK: Left lateral shoulder palpable nodule nontender.  Normal shoulder motion pain with abduction and internal rotation.  Positive Hawkins and Neer's test positive empty can test.  Right thumb: Bossing and swelling first CMC.  Tender to palpation.    Lab and Radiology Results  EXAM: LEFT SHOULDER - 2+ VIEW   COMPARISON:  Left shoulder ultrasound 05/18/2021   FINDINGS: No soft tissue mass is evident plain films. Visualized hemithorax is clear.   IMPRESSION: Negative left shoulder radiographs.     Electronically Signed   By: San Morelle M.D.   On: 05/21/2021 10:39  EXAM: RIGHT WRIST - COMPLETE 3+ VIEW   COMPARISON:  None.   FINDINGS: Advanced degenerative changes are noted at the first [facility]  joints. There is subluxation of the joint laterally. No acute osseous abnormalities are present.   IMPRESSION: Advanced degenerative changes at the first [facility] joint.     Electronically Signed   By: San Morelle M.D.   On: 05/21/2021 10:36    I, [name], personally (independently) visualized and performed the interpretation of the images attached in this note.   Assessment and Plan: 55 y.o. female with left shoulder pain.  Multifactorial.  Patient has thought to be rotator cuff tendinopathy as well as scapular dysfunction.  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  At this point plan for an MRI to evaluate the lipoma.  Discussed with radiology we will proceed with a noncontrast MRI with extra T1 nonfat saturation studies to better evaluate the lipoma.  MRI should also help determine cause of shoulder pain and plan for potential injections or even surgery. Recheck after MRI.  Right thumb pain: Thought to be due to DJD first CMC.  Patient is a good candidate for steroid injection.  Unfortunately she has to work tonight and I think that it is likely she will have worsening pain tonight after the injection.  She does not have to work Friday evening and so she will schedule a return visit for Friday morning for dedicated thumb injection.   PDMP not reviewed this encounter. Orders Placed This Encounter  Procedures  [place] LIMITED JOINT SPACE STRUCTURES UP LEFT(NO LINKED CHARGES)    Order Specific Question:   Reason for Exam (SYMPTOM  OR DIAGNOSIS REQUIRED)    Answer:   L shoulder    Order Specific Question:   Preferred imaging location?    Answer:   [place]   MR SHOULDER LEFT WO CONTRAST    Indication: Rotator cuff tendinitis and lipoma. Per my conversation with radiology.  Please include T1 nonfat sat in 2 planes to evaluate the lipoma in addition to normal shoulder series.    Standing Status:   Future     Standing Expiration Date:   07/24/2022    Scheduling Instructions:     Indication: Rotator cuff tendinitis and lipoma.     Per my conversation with radiology.  Please include T1 nonfat sat in 2 planes to evaluate the lipoma in addition to normal shoulder series.    Order Specific Question:   What is the patient's sedation requirement?    Answer:   No Sedation    Order Specific Question:   Does the patient have a pacemaker or implanted devices?    Answer:   No    Order Specific Question:   Preferred imaging location?    Answer:   [facility] (table limit-550lbs)   No orders of the defined types were placed in this encounter.    Discussed warning signs or symptoms. Please see discharge instructions. Patient expresses understanding.   The above documentation has been reviewed and is accurate and complete [name], M.D.

## 2021-07-24 NOTE — Patient Instructions (Addendum)
Thank you for coming in today.   Plan for MRI shoulder to evaluate the lipoma and the shoulder pain.  Anticipate injection on the recheck visit.   Do the home exercses we discussed.   Please perform the exercise program that we have prepared for you and gone over in detail on a daily basis.  In addition to the handout you were provided you can access your program through: [email or web address]   Your unique program code is:   [email or web address]  Return following MRI.   Schedule the thumb injection Friday after work.

## 2021-07-25 ENCOUNTER — Telehealth: Payer: Self-pay | Admitting: Internal Medicine

## 2021-07-25 NOTE — Telephone Encounter (Signed)
Pt would like a call to discuss problems with her allergy. It is not triggering her asthma. she does not have an allergist. She wants to explain DR panosh what excaley what she is requesting

## 2021-07-27 ENCOUNTER — Other Ambulatory Visit: Payer: Self-pay

## 2021-07-27 ENCOUNTER — Ambulatory Visit: Payer: Self-pay

## 2021-07-27 ENCOUNTER — Ambulatory Visit (INDEPENDENT_AMBULATORY_CARE_PROVIDER_SITE_OTHER): Payer: BC Managed Care – PPO | Admitting: Family Medicine

## 2021-07-27 DIAGNOSIS — M79644 Pain in right finger(s): Secondary | ICD-10-CM

## 2021-07-27 NOTE — Progress Notes (Signed)
Cianni presents to clinic today for scheduled injection right thumb 1st CMC injection.   Procedure: Real-time Ultrasound Guided Injection of right thumb CMC Device: Philips Affiniti 50G Images permanently stored and available for review in PACS Verbal informed consent obtained.  Discussed risks and benefits of procedure. Warned about infection bleeding damage to structures skin hypopigmentation and fat atrophy among others. Patient expresses understanding and agreement Time-out conducted.   Noted no overlying erythema, induration, or other signs of local infection.   Skin prepped in a sterile fashion.   Local anesthesia: Topical Ethyl chloride.   With sterile technique and under real time ultrasound guidance: 0.5 mL of Depo-Medrol 80 mg/mL solution and 0.5 mL of lidocaine injected into first [place]. Fluid seen entering the joint capsule.   Completed without difficulty   Pain moderately  resolved suggesting accurate placement of the medication.   Advised to call if fevers/chills, erythema, induration, drainage, or persistent bleeding.   Images permanently stored and available for review in the ultrasound unit.  Impression: Technically successful ultrasound guided injection.   Returns following scheduled MRI shoulder in the near future.

## 2021-07-27 NOTE — Patient Instructions (Addendum)
Good to see you this morning.  You had a R thumb injection.  Call or go to the ER if you develop a large red swollen joint with extreme pain or oozing puss.   Follow-up after L shoulder MRI.  Schedule for 2-3 days after MRI.

## 2021-07-28 ENCOUNTER — Telehealth: Payer: Self-pay | Admitting: Family Medicine

## 2021-07-28 ENCOUNTER — Other Ambulatory Visit (HOSPITAL_COMMUNITY): Payer: Self-pay

## 2021-07-28 MED ORDER — TRAMADOL HCL 50 MG PO TABS
50.0000 mg | ORAL_TABLET | Freq: Three times a day (TID) | ORAL | 0 refills | Status: DC | PRN
  Filled 2021-07-28: qty 10, 4d supply, fill #0

## 2021-07-28 NOTE — Telephone Encounter (Signed)
[name] contacted me on 07/28/21 to let me know that her thumb hurts worse the day after a 1st CMC injection. I sent in tramadol. Suspect steroid flair.  Recheck if not better.

## 2021-08-01 NOTE — Telephone Encounter (Signed)
Has upcoming appointment

## 2021-08-02 ENCOUNTER — Encounter: Payer: Self-pay | Admitting: Internal Medicine

## 2021-08-02 ENCOUNTER — Telehealth (INDEPENDENT_AMBULATORY_CARE_PROVIDER_SITE_OTHER): Payer: BC Managed Care – PPO | Admitting: Internal Medicine

## 2021-08-02 VITALS — Ht 60.0 in | Wt 143.6 lb

## 2021-08-02 DIAGNOSIS — T7589XA Other specified effects of external causes, initial encounter: Secondary | ICD-10-CM | POA: Diagnosis not present

## 2021-08-02 DIAGNOSIS — T7840XA Allergy, unspecified, initial encounter: Secondary | ICD-10-CM

## 2021-08-02 DIAGNOSIS — Z872 Personal history of diseases of the skin and subcutaneous tissue: Secondary | ICD-10-CM

## 2021-08-02 NOTE — Progress Notes (Signed)
Virtual Visit via Video Note  I connected with [name] on 08/02/21 at  9:00 AM EDT by a video enabled telemedicine application and verified that I am speaking with the correct person using two identifiers. Location patient: home Location provider:work or home office Persons participating in the virtual visit: patient, provider  [place] national recommendations  regarding COVID 19 pandemic   video visit is advised over in office visit for this patient.  Patient aware  of the limitations of evaluation and management by telemedicine and  availability of in person appointments. and agreed to proceed.   HPI: [name] presents for video visit  see previous note .  Since February she has had 4-5 times where she has had 2 leave work after exposure to [employer] and decrease her citrus smell where she gets a reaction of itchy skin prickling on the scalp and sometimes hives similar to what she gets when she eats an orange.  She sometimes she will have to take Benadryl sometimes have to go home and take Benadryl to get away from the exposure and then feel better. She has seen allergist in the remote past and they wanted to do a food challenge and she was scared scared her feel for that and did not proceed.  However when she has oranges papaya certain types of citrus she gets the similar reaction.  She is able to take bananas.  This is been ongoing since February.  She is asking and for her supervisor note for medical reasons to consider alternative chemical to avoid these reactions. She is rarely getting reactions otherwise where it is an unknown trigger.    ROS: See pertinent positives and negatives per HPI.  Past Medical History:  Diagnosis Date   Allergic reaction caused by a drug 01/25/2013   probably.    Allergy    Anemia    Asthma    GERD (gastroesophageal reflux disease)    Heart murmur    Hypertension    Positive TB test    Shingles    TOBACCO USE 03/22/2009   Qualifier:  Diagnosis of  By: Regis Bill MD, [name]  Stopped Dec 12 2010      Past Surgical History:  Procedure Laterality Date   ABDOMINAL HYSTERECTOMY     BREAST SURGERY     cyst from left breast   BUNIONECTOMY     both feet   CHOLECYSTECTOMY     CYSTECTOMY     left shoulder   KNEE SURGERY Left    PANENDOSCOPY     TUBAL LIGATION     uterine ablastion     and polyps removed   Uterine polyps      Family History  Problem Relation Age of Onset   Hypertension Mother    Alzheimer's disease Mother    Hypertension Father    Prostate cancer Father    HIV Brother    Lupus Sister    Colon cancer Paternal Grandmother    Esophageal cancer Neg Hx    Liver cancer Neg Hx    Pancreatic cancer Neg Hx    Rectal cancer Neg Hx    Stomach cancer Neg Hx     Social History   Tobacco Use   Smoking status: Former    Packs/day: 0.50    Years: 17.00    Pack years: 8.50    Types: Cigarettes    Quit date: 12/12/2010    Years since quitting: 10.6   Smokeless tobacco: Never  Vaping Use   Vaping Use: Never used  Substance Use Topics   Alcohol use: No   Drug use: No      Current Outpatient Medications:    albuterol (PROVENTIL) (2.5 MG/3ML) 0.083% nebulizer solution, Inhale 3 mLs (2.5 mg total) by nebulization every 6 (six) hours as needed for wheezing or shortness of breath., Disp: 125 mL, Rfl: 12   albuterol (VENTOLIN HFA) 108 (90 Base) MCG/ACT inhaler, Inhale 2 puffs into the lungs every 4 (four) hours as needed., Disp: 24 g, Rfl: 3   EPINEPHRINE 0.3 mg/0.3 mL IJ SOAJ injection, INJECT 1 SYRINGE INTO THE MUSCLE AS NEEDED FOR ANAPHYLAXIS (Patient taking differently: Inject 0.3 mg into the muscle as needed for anaphylaxis.), Disp: 2 each, Rfl: 1   famotidine (PEPCID) 20 MG tablet, One at bedtime (Patient taking differently: Take 20 mg by mouth at bedtime. One at bedtime), Disp: 60 tablet, Rfl: 11   fluticasone (FLONASE) 50 MCG/ACT nasal spray, USE 2 SPRAYS IN EACH NOSTRIL DAILY, Disp: 48 g, Rfl: 2    hydrochlorothiazide (HYDRODIURIL) 25 MG tablet, Take 1 tablet (25 mg total) by mouth daily., Disp: 30 tablet, Rfl: 4   ibuprofen (ADVIL) 200 MG tablet, Take 400 mg by mouth every 6 (six) hours as needed for moderate pain., Disp: , Rfl:    methocarbamol (ROBAXIN) 500 MG tablet, Take 1 tablet (500 mg total) by mouth every 8 (eight) hours as needed for muscle spasms., Disp: 10 tablet, Rfl: 0   mometasone-formoterol (DULERA) 100-5 MCG/ACT AERO, Take 2 puffs first thing in  the morning and then another 2 puffs about 12 hours later., Disp: 39 g, Rfl: 3   ofloxacin (FLOXIN) 0.3 % OTIC solution, Place 10 drops into the right ear daily., Disp: 10 mL, Rfl: 0   ondansetron (ZOFRAN ODT) 4 MG disintegrating tablet, Take 1 tablet (4 mg total) by mouth every 8 (eight) hours as needed for nausea or vomiting., Disp: 20 tablet, Rfl: 0   pantoprazole (PROTONIX) 40 MG tablet, TAKE ONE TABLET BY MOUTH TWICE DAILY BEFORE MEALS. TAKE 30 TO 60 MINUTES BEFORE FIRST MEAL OF THE DAY, Disp: 60 tablet, Rfl: 0   predniSONE (STERAPRED UNI-PAK 48 TAB) 10 MG (48) TBPK tablet, Take as directed package instructions for 12 days, Disp: 48 tablet, Rfl: 0   traMADol (ULTRAM) 50 MG tablet, Take 1 tablet by mouth every 8 hours as needed for severe pain., Disp: 10 tablet, Rfl: 0   valACYclovir (VALTREX) 1000 MG tablet, Take 1 tablet (1,000 mg total) by mouth 3 (three) times daily. For shingles, Disp: 21 tablet, Rfl: 0   valsartan (DIOVAN) 320 MG tablet, Take 0.5 tablets (160 mg total) by mouth daily., Disp: 30 tablet, Rfl: 2   VITAMIN D PO, Take 1 capsule by mouth daily., Disp: , Rfl:   EXAM: BP Readings from Last 3 Encounters:  07/24/21 (!) 148/98  06/15/21 120/82  06/04/21 124/86    VITALS per patient if applicable:  GENERAL: alert, oriented, appears well and in no acute distress  HEENT: atraumatic, conjunttiva clear, no obvious abnormalities on inspection of external nose and ears  NECK: normal movements of the head and  neck  LUNGS: on inspection no signs of respiratory distress, breathing rate appears normal, no obvious gross SOB, gasping or wheezing  CV: no obvious cyanosis  MS: moves all visible extremities without noticeable abnormality  PSYCH/NEURO: pleasant and cooperative, no obvious depression or anxiety, speech and thought processing grossly intact Lab Results  Component Value Date  WBC 4.8 05/18/2021   HGB 12.6 05/18/2021   HCT 38.1 05/18/2021   PLT 324.0 05/18/2021   GLUCOSE 90 05/18/2021   CHOL 195 05/19/2018   TRIG 41.0 05/19/2018   HDL 83.80 05/19/2018   LDLCALC 103 (H) 05/19/2018   ALT 9 05/18/2021   AST 15 05/18/2021   NA 139 05/18/2021   K 3.6 05/18/2021   CL 104 05/18/2021   CREATININE 0.94 05/18/2021   BUN 16 05/18/2021   CO2 28 05/18/2021   TSH 0.97 05/18/2021   INR 1.0 01/17/2021   MICROALBUR 0.7 05/30/2009    ASSESSMENT AND PLAN:  Discussed the following assessment and plan:    ICD-10-CM   1. Allergic reaction, initial encounter  T78.40XA    work place exposure to [employer], seems cw hx and allergy rxto oranges some citris fruit reaction relieved by benadryl.,     2. H/O allergic urticaria  Z87.2     3. Environmental exposure  T75.89XA      Based on reaction history and repeated at exposure with reaction appears to be an allergic reaction with tingling hives and seems to be consistent with exposure to the [employer].  In addition because of her similar reaction when she eats an orange and some types of citrus agree avoidance is appropriate. She may want to go back to allergy even though they had recommended a in office test exposure under controlled environment. Will compose a letter documenting her allergic reaction with what appears to be this degreaser with citrus base.  She usually has Benadryl on hand. Counseled.   Expectant management and discussion of plan and treatment with opportunity to ask questions and all were answered.  The patient agreed with the plan and demonstrated an understanding of the instructions.   Advised to call back or seek an in-person evaluation if worsening  or having  further concerns . Return if symptoms worsen or fail to improve with avoidance and meds, for when planned.    Shanon Ace, MD

## 2021-08-03 ENCOUNTER — Telehealth: Payer: Self-pay

## 2021-08-03 NOTE — Telephone Encounter (Signed)
-----   Message from Burnis Medin, MD sent at 08/02/2021  1:42 PM EDT ----- Please print out copy of letter for her ( if it didn't print already) and she can pick it up. I sent a copy on My chart  can use stamped signature  thanks

## 2021-08-03 NOTE — Telephone Encounter (Signed)
Letter has been placed in front office for pickup

## 2021-08-08 ENCOUNTER — Telehealth: Payer: Self-pay | Admitting: Internal Medicine

## 2021-08-08 NOTE — Telephone Encounter (Signed)
Patient dropped off a form that she would like Dr. Regis Bill to complete.   Patient would like a call at [phone] once form is completed.  Form will be placed in folder.  Please advise.

## 2021-08-16 ENCOUNTER — Ambulatory Visit
Admission: RE | Admit: 2021-08-16 | Discharge: 2021-08-16 | Disposition: A | Discharge status: Home / Self Care | Payer: BC Managed Care – PPO | Source: Ambulatory Visit | Attending: Family Medicine | Admitting: Family Medicine

## 2021-08-16 ENCOUNTER — Other Ambulatory Visit: Payer: Self-pay

## 2021-08-16 DIAGNOSIS — S46012A Strain of muscle(s) and tendon(s) of the rotator cuff of left shoulder, initial encounter: Secondary | ICD-10-CM | POA: Diagnosis not present

## 2021-08-16 DIAGNOSIS — M19012 Primary osteoarthritis, left shoulder: Secondary | ICD-10-CM | POA: Diagnosis not present

## 2021-08-16 DIAGNOSIS — D1722 Benign lipomatous neoplasm of skin and subcutaneous tissue of left arm: Secondary | ICD-10-CM

## 2021-08-16 DIAGNOSIS — M7552 Bursitis of left shoulder: Secondary | ICD-10-CM | POA: Diagnosis not present

## 2021-08-16 DIAGNOSIS — D179 Benign lipomatous neoplasm, unspecified: Secondary | ICD-10-CM | POA: Diagnosis not present

## 2021-08-16 DIAGNOSIS — G8929 Other chronic pain: Secondary | ICD-10-CM

## 2021-08-20 NOTE — Progress Notes (Signed)
Left shoulder MRI: The mass that you have felt is a lipoma  However you also have some significant rotator cuff tendinitis with some partial tearing. You have medium arthritis of the small joint at the top of the shoulder (AC joint) You do have some mild arthritis changes in the main shoulder joint as well as some loose bodies and some bursitis present in the shoulder.  Recommend return to clinic to go over the results of full detail and discuss treatment plan and options

## 2021-08-20 NOTE — Telephone Encounter (Signed)
So I cannot print the copy of the form from the picture sent. Get a copy of the form that we can complete.

## 2021-08-22 NOTE — Telephone Encounter (Signed)
Form completed and signed

## 2021-08-22 NOTE — Progress Notes (Signed)
I, [name], LAT, ATC, am serving as scribe for Dr. [name].  [name] is a 55 y.o. female who presents to [place] [facility] today for f/u of chronic L shoulder pain and L shoulder MRI review.  She was last seen by Dr. Georgina Snell on 07/27/21 for a R thumb injection, procedure-only visit and prior to that on 07/24/21 for f/u of chronic L shoulder pain due to a cyst/lump located at her L ant shoulder.  She was referred for a L shoulder MRI to further evaluate the cyst/lump thought to be a lipoma.  She was also shown a HEP focusing on RC and periscapular strengthening.  Today, pt reports that her L shoulder feels worse than before.  She notes that she feels like the positioning during her MRI contributed to her current worsening pain.  Additionally at the last visit she had a right first [place] injection which helped however she still has thumb pain.  Diagnostic testing: L shoulder MRI- 08/16/21; L shoulder XR- 05/18/21  Pertinent review of systems: No fevers or chills  Relevant historical information: Hypertension   Exam:  BP 120/78 (BP Location: Right Arm, Patient Position: Sitting, Cuff Size: Normal)   Pulse 76   Ht 5' (1.524 m)   Wt 146 lb 6.4 oz (66.4 kg)   SpO2 97%   BMI 28.59 kg/m  General: Well Developed, well nourished, and in no acute distress.   MSK: Left shoulder normal-appearing pain with abduction intact strength.  Right hand thumb spica wrap today.    Lab and Radiology Results  EXAM: MRI OF THE LEFT SHOULDER WITHOUT CONTRAST   TECHNIQUE: Multiplanar, multisequence MR imaging of the shoulder was performed. No intravenous contrast was administered.   COMPARISON:  Radiograph 05/18/2021   FINDINGS: The patient's palpable abnormality is marked with vitamin-E capsules. This corresponds to a 2.5 x 1.5 cm fatty lesion consistent with a benign subcutaneous encapsulated lipoma. No worrisome MR imaging features.   Rotator cuff:  Significant supraspinatus tendinopathy/tendinosis with partial-thickness articular surface tearing along the anterior aspect in the critical zone region. Maximum laminar retraction of the articular fibers is 10 mm. There is also areas of bursal surface regularity but no full-thickness retracted tear is identified. The infraspinatus and subscapularis tendons are intact. Mild tendinopathy.   Muscles:  No significant findings.   Biceps long head:  Intact   Acromioclavicular Joint: Moderate degenerative changes with fluid in and around the joint. Type 2-3 acromion. No lateral downsloping subacromial spurring.   Glenohumeral Joint: Mild degenerative changes with degenerative chondrosis and early subchondral cystic change involving the glenoid. Small to moderate-sized joint effusion is also noted with possible small loose bodies or synovitis in the axillary recess. There is also rotator interval synovitis.   Labrum:  No definite labral tears.   Bones:  No acute bony findings.   Other: Mild subacromial/subdeltoid bursitis.   IMPRESSION: 1. The patient's palpable abnormality corresponds to a benign subcutaneous encapsulated lipoma. No worrisome MR imaging features. 2. Significant supraspinatus tendinopathy/tendinosis with partial-thickness articular surface tearing along the anterior aspect in the critical zone region. 3. Intact long head biceps tendon and glenoid labrum. 4. Moderate AC joint degenerative changes and type 2-3 acromion may contribute to bony impingement. 5. Mild glenohumeral joint degenerative changes with small to moderate-sized joint effusion and possible small loose bodies or synovitis in the axillary recess. 6. Rotator interval synovitis. 7. Mild subacromial/subdeltoid bursitis.     Electronically Signed   By: Mamie Nick.  Gallerani M.D.   On: 08/18/2021 10:41 I, [name], personally (independently) visualized and performed the interpretation of the images attached  in this note.     Assessment and Plan: 55 y.o. female with left shoulder pain and lipoma.  Lipoma is bothersome and patient is interested in removal if possible. Shoulder pain multifactorial secondary to subacromial impingement and bursitis and rotator cuff tendinopathy with small tear as well as AC DJD.  This is obviously reasonable to treat with surgical decompression.  I am not sure if the small rotator cuff tear could be treated with simple decompression or needs a repair.  I think it is worthwhile for her to have a consultation with orthopedic surgery.  I think she is probably a good candidate for subacromial decompression and distal clavicle excision which has a quicker recovery time.  At the same time I think it is possible to have the lipoma removed there by consolidating some of her issues.  Additionally she notes some continued right thumb pain which probably in the future will need to be addressed surgically.   PDMP not reviewed this encounter. Orders Placed This Encounter  Procedures   Ambulatory referral to Orthopedic Surgery    Referral Priority:   Routine    Referral Type:   Surgical    Referral Reason:   Specialty Services Required    Requested Specialty:   Orthopedic Surgery    Number of Visits Requested:   1   No orders of the defined types were placed in this encounter.    Discussed warning signs or symptoms. Please see discharge instructions. Patient expresses understanding.   The above documentation has been reviewed and is accurate and complete [name], M.D. Total encounter time 20 minutes including face-to-face time with the patient and, reviewing past medical record, and charting on the date of service.   Discussed treatment plan and options as well as reviewed MRI.

## 2021-08-23 ENCOUNTER — Encounter: Payer: Self-pay | Admitting: Family Medicine

## 2021-08-23 ENCOUNTER — Ambulatory Visit (INDEPENDENT_AMBULATORY_CARE_PROVIDER_SITE_OTHER): Payer: BC Managed Care – PPO | Admitting: Family Medicine

## 2021-08-23 ENCOUNTER — Other Ambulatory Visit: Payer: Self-pay

## 2021-08-23 VITALS — BP 120/78 | HR 76 | Ht 60.0 in | Wt 146.4 lb

## 2021-08-23 DIAGNOSIS — G8929 Other chronic pain: Secondary | ICD-10-CM

## 2021-08-23 DIAGNOSIS — M79644 Pain in right finger(s): Secondary | ICD-10-CM | POA: Diagnosis not present

## 2021-08-23 DIAGNOSIS — M25512 Pain in left shoulder: Secondary | ICD-10-CM | POA: Diagnosis not present

## 2021-08-23 DIAGNOSIS — D1722 Benign lipomatous neoplasm of skin and subcutaneous tissue of left arm: Secondary | ICD-10-CM | POA: Diagnosis not present

## 2021-08-23 NOTE — Patient Instructions (Addendum)
Good to see you today.  Follow-up w/ [facility] about your L shoulder.  Follow-up w/ me as needed.

## 2021-08-23 NOTE — Telephone Encounter (Signed)
Form placed in front office for pickup and pt is aware.

## 2021-09-05 ENCOUNTER — Other Ambulatory Visit (HOSPITAL_COMMUNITY): Payer: Self-pay

## 2021-09-05 ENCOUNTER — Ambulatory Visit: Payer: Self-pay

## 2021-09-05 ENCOUNTER — Encounter: Payer: Self-pay | Admitting: Orthopedic Surgery

## 2021-09-05 ENCOUNTER — Ambulatory Visit (INDEPENDENT_AMBULATORY_CARE_PROVIDER_SITE_OTHER): Payer: BC Managed Care – PPO | Admitting: Orthopedic Surgery

## 2021-09-05 ENCOUNTER — Other Ambulatory Visit: Payer: Self-pay

## 2021-09-05 DIAGNOSIS — M79602 Pain in left arm: Secondary | ICD-10-CM | POA: Diagnosis not present

## 2021-09-05 MED ORDER — CYCLOBENZAPRINE HCL 10 MG PO TABS
ORAL_TABLET | ORAL | 0 refills | Status: DC
  Filled 2021-09-05: qty 30, 30d supply, fill #0

## 2021-09-05 NOTE — Progress Notes (Signed)
Office Visit Note   Patient: [name]           Date of Birth: 22-Jun-1966           MRN: [id number] Visit Date: 09/05/2021 Requested by: Gregor Hams, MD [place],  [place] [id number] PCP: Burnis Medin, MD  Subjective: Chief Complaint  Patient presents with   Left Shoulder - Pain   Neck - Pain    HPI: [name] is a 55 year old patient with left shoulder pain.  She has had pain for 6 months.  Initially injured the shoulder about 30 years ago moving a patient from the shower to the wheelchair.  Patient describes having a cyst removed from the shoulder around the time of that injury 30 years ago.  Patient does report pain that wakes her from sleep at night.  Describes primarily axillary pain with some pain that radiates down below the elbow into the forearm.  Also reports some numbness and tingling with this.  Hurts her to move the shoulder.  She works in a [employer] which is physical work.  Hard for her to sleep on that left-hand side.  She describes no injections in her shoulder.  Reports primarily pain but no mechanical symptoms.  Nonsteroidals have not been helpful.  She had an MRI scan of the left shoulder 2 weeks ago which is reviewed.  It shows some debris in the axillary recess with synovitis within the rotator interval, supraspinatus tendinopathy with partial-thickness articular and bursal sided tearing but no full-thickness component, AC joint arthritis with mild effusion in the [facility] joint, lipoma in the lateral acromial region.  Patient has been taking Ultram with minimal relief.              ROS: All systems reviewed are negative as they relate to the chief complaint within the history of present illness.  Patient denies  fevers or chills.   Assessment & Plan: Visit Diagnoses:  1. Left arm pain     Plan: Impression is complicated clinical picture involving the left shoulder and possibly the neck.  She has some degenerative changes in the cervical spine with  radicular symptoms extending below the elbow.  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  I think she is heading for shoulder surgery.  Would like to get MRI scan of the cervical spine to rule out radiculopathy particularly with the degenerative changes noted in the cervical spine.  Flexeril to be taken at night.  Diagnostic AC joint injection offered today but Friday morning is a better time for her and it will be performed then.  We will also inject some cortisone at that time to see if we can get some sustained relief from the [facility] joint mediated pain on the superior aspect of the shoulder.  Could also consider at the time of her MRI review and intra-articular glenohumeral joint injection depending on how she does with the [facility] joint injection.  If AC joint arthritis and early frozen shoulder are her primary issues we may be able to take care of those without surgery; however, she has been dealing with this for many months.  Would like to get at least some positive response from an injection before we remove the distal clavicle.  Follow-up on Friday for that injection.  Follow-Up Instructions: No follow-ups on file.   Orders:  Orders Placed This Encounter  Procedures   XR Cervical Spine 2 or 3 views  MR Cervical Spine w/o contrast   Meds ordered this encounter  Medications   cyclobenzaprine (FLEXERIL) 10 MG tablet    Sig: Take 1 tablet by mouth at bedtime as needed.    Dispense:  30 tablet    Refill:  0      Procedures: No procedures performed   Clinical Data: No additional findings.  Objective: Vital Signs: There were no vitals taken for this visit.  Physical Exam:   Constitutional: Patient appears well-developed HEENT:  Head: Normocephalic Eyes:EOM are normal Neck: Normal range of motion Cardiovascular: Normal rate Pulmonary/chest: Effort normal Neurologic: Patient is alert Skin: Skin is warm Psychiatric: Patient has normal mood and  affect   Ortho Exam: Ortho exam demonstrates pretty reasonable cervical spine range of motion.  She has passive range of motion on the right of 70/110/180.  On the left her motion is 60/90/160.  She has tenderness to palpation left AC joint versus right.  Some pain with crossarm adduction.  No coarse grinding or crepitus with internal ex rotation of the left arm at 90 degrees of abduction.  No other masses lymphadenopathy or skin changes noted in that shoulder girdle region.  In general the shoulder is stiffer on the left than the right with internal rotation to about T5 on the right and only to about L5 or less on the left.  Mild paresthesias C6 distribution left versus right.  No muscle atrophy left versus right.  Specialty Comments:  No specialty comments available.  Imaging: XR Cervical Spine 2 or 3 views  Result Date: 09/05/2021 AP lateral cervical spine radiographs reviewed.  They show C5-6 C6-7 degenerative disc disease with no acute fracture or spondylolisthesis.  Mild facet arthritis present at this level as well.    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    Allergy    Anemia    Asthma    GERD (gastroesophageal reflux disease)    Heart murmur    Hypertension    Positive TB test    Shingles    TOBACCO USE 03/22/2009   Qualifier: Diagnosis of  By: Regis Bill MD, [name]  Stopped Dec 12 2010      Family History  Problem Relation Age of Onset   Hypertension Mother    Alzheimer's disease Mother    Hypertension Father    Prostate cancer Father    HIV Brother    Lupus Sister     Colon cancer Paternal Grandmother    Esophageal cancer Neg Hx    Liver cancer Neg Hx    Pancreatic cancer Neg Hx    Rectal cancer Neg Hx    Stomach cancer Neg Hx     Past Surgical History:  Procedure Laterality Date   ABDOMINAL HYSTERECTOMY     BREAST SURGERY     cyst from left breast   BUNIONECTOMY     both feet   CHOLECYSTECTOMY     CYSTECTOMY     left shoulder   KNEE SURGERY Left    PANENDOSCOPY     TUBAL LIGATION  uterine ablastion     and polyps removed   Uterine polyps     Social History   Occupational History   Not on file  Tobacco Use   Smoking status: Former    Packs/day: 0.50    Years: 17.00    Pack years: 8.50    Types: Cigarettes    Quit date: 12/12/2010    Years since quitting: 10.7   Smokeless tobacco: Never  Vaping Use   Vaping Use: Never used  Substance and Sexual Activity   Alcohol use: No   Drug use: No   Sexual activity: Not on file

## 2021-09-06 ENCOUNTER — Other Ambulatory Visit (HOSPITAL_COMMUNITY): Payer: Self-pay

## 2021-09-07 ENCOUNTER — Ambulatory Visit (INDEPENDENT_AMBULATORY_CARE_PROVIDER_SITE_OTHER): Payer: BC Managed Care – PPO | Admitting: Surgical

## 2021-09-07 ENCOUNTER — Other Ambulatory Visit: Payer: Self-pay

## 2021-09-07 ENCOUNTER — Encounter: Payer: Self-pay | Admitting: Surgical

## 2021-09-07 ENCOUNTER — Ambulatory Visit: Payer: Self-pay

## 2021-09-07 DIAGNOSIS — M79602 Pain in left arm: Secondary | ICD-10-CM

## 2021-09-07 DIAGNOSIS — M25512 Pain in left shoulder: Secondary | ICD-10-CM

## 2021-09-07 MED ORDER — METHYLPREDNISOLONE ACETATE 40 MG/ML IJ SUSP
13.3300 mg | INTRAMUSCULAR | Status: AC | PRN
  Administered 2021-09-07: 13.33 mg via INTRA_ARTICULAR

## 2021-09-07 MED ORDER — LIDOCAINE HCL 1 % IJ SOLN
3.0000 mL | INTRAMUSCULAR | Status: AC | PRN
  Administered 2021-09-07: 3 mL

## 2021-09-07 MED ORDER — BUPIVACAINE HCL 0.25 % IJ SOLN
0.6600 mL | INTRAMUSCULAR | Status: AC | PRN
  Administered 2021-09-07: .66 mL via INTRA_ARTICULAR

## 2021-09-07 NOTE — Progress Notes (Signed)
   Procedure Note  Patient: [name]             Date of Birth: 1966-04-03           MRN: [id number]             Visit Date: 09/07/2021  Procedures: Visit Diagnoses:  1. Left arm pain     Medium Joint Inj: L acromioclavicular on 09/07/2021 12:28 PM Indications: diagnostic evaluation and pain Details: 25 G 1.5 in needle, ultrasound-guided superior approach Medications: 3 mL lidocaine 1 %; 0.66 mL bupivacaine 0.25 %; 13.33 mg methylPREDNISolone acetate 40 MG/ML Outcome: tolerated well, no immediate complications Procedure, treatment alternatives, risks and benefits explained, specific risks discussed. Consent was given by the patient. Immediately prior to procedure a time out was called to verify the correct patient, procedure, equipment, support staff and site/side marked as required. Patient was prepped and draped in the usual sterile fashion.

## 2021-09-18 ENCOUNTER — Encounter: Payer: Self-pay | Admitting: Orthopedic Surgery

## 2021-09-23 ENCOUNTER — Ambulatory Visit
Admission: RE | Admit: 2021-09-23 | Discharge: 2021-09-23 | Disposition: A | Discharge status: Home / Self Care | Payer: BC Managed Care – PPO | Source: Ambulatory Visit | Attending: Orthopedic Surgery | Admitting: Orthopedic Surgery

## 2021-09-23 ENCOUNTER — Other Ambulatory Visit: Payer: Self-pay

## 2021-09-23 DIAGNOSIS — M5023 Other cervical disc displacement, cervicothoracic region: Secondary | ICD-10-CM | POA: Diagnosis not present

## 2021-09-23 DIAGNOSIS — M50221 Other cervical disc displacement at C4-C5 level: Secondary | ICD-10-CM | POA: Diagnosis not present

## 2021-09-23 DIAGNOSIS — M50223 Other cervical disc displacement at C6-C7 level: Secondary | ICD-10-CM | POA: Diagnosis not present

## 2021-09-23 DIAGNOSIS — M47812 Spondylosis without myelopathy or radiculopathy, cervical region: Secondary | ICD-10-CM | POA: Diagnosis not present

## 2021-09-23 DIAGNOSIS — M79602 Pain in left arm: Secondary | ICD-10-CM

## 2021-09-25 ENCOUNTER — Telehealth: Payer: Self-pay

## 2021-09-25 NOTE — Telephone Encounter (Signed)
Patient sent mychart message asking for results of her MRI scan. Please advise. Contact [phone]

## 2021-10-01 NOTE — Telephone Encounter (Signed)
She definitely has some issues with her neck in terms of degenerative changes and spurring which could be causing some of her arm pain.  If you could let her know that the neck step for her would be considering injection in her neck to see if that wrist believe some of her arm symptoms.  Would also need to know how she did with the [facility] joint injection in terms of immediate relief within the first 3 to 4 hours as well as any sustained relief after several days.  In general she has what looks to be some stiffness in her shoulder but no definite rotator cuff pathology.  I think her cervical spine could also be at play and if she wanted to try to get some relief from her neck before engaging in any type of shoulder surgical intervention I think it would be reasonable to try to have her see Dr. Ernestina Patches for some neck injections.  I did try to call her but could not reach her.

## 2021-10-02 ENCOUNTER — Telehealth: Payer: Self-pay | Admitting: Orthopedic Surgery

## 2021-10-02 NOTE — Telephone Encounter (Signed)
Pt returning phone call for Dr. Marlou Sa in regards to [facility] message she sent for mri results. The best call back number is [phone].

## 2021-10-02 NOTE — Telephone Encounter (Signed)
IC see other note for additional documentation.

## 2021-10-09 ENCOUNTER — Other Ambulatory Visit (HOSPITAL_COMMUNITY): Payer: Self-pay

## 2021-10-09 ENCOUNTER — Ambulatory Visit (INDEPENDENT_AMBULATORY_CARE_PROVIDER_SITE_OTHER): Payer: BC Managed Care – PPO | Admitting: Physical Medicine and Rehabilitation

## 2021-10-09 ENCOUNTER — Encounter: Payer: Self-pay | Admitting: Physical Medicine and Rehabilitation

## 2021-10-09 ENCOUNTER — Other Ambulatory Visit: Payer: Self-pay

## 2021-10-09 DIAGNOSIS — M7918 Myalgia, other site: Secondary | ICD-10-CM | POA: Diagnosis not present

## 2021-10-09 DIAGNOSIS — M5412 Radiculopathy, cervical region: Secondary | ICD-10-CM | POA: Diagnosis not present

## 2021-10-09 DIAGNOSIS — M542 Cervicalgia: Secondary | ICD-10-CM

## 2021-10-09 MED ORDER — DIAZEPAM 5 MG PO TABS
ORAL_TABLET | ORAL | 0 refills | Status: DC
Start: 2021-10-09 — End: 2021-11-28
  Filled 2021-10-09 – 2021-10-19 (×2): qty 2, 1d supply, fill #0

## 2021-10-09 NOTE — Progress Notes (Signed)
[name] - 55 y.o. female MRN [id number]  Date of birth: 01-19-1966  Office Visit Note: Visit Date: 10/09/2021 PCP: Burnis Medin, MD Referred by: Burnis Medin, MD  Subjective: Chief Complaint  Patient presents with   Neck - Pain   Lower Back - Pain   HPI: [name] is a 55 y.o. female who comes in today per the request of Dr. Alphonzo Severance for evaluation of midline neck pain radiating to left shoulder and down arm to thumb, 1st and 2nd digits. Patient reports pain has been ongoing for several months. She reports pain is exacerbated by movement and activity. Patient describes pain as constant tenderness, burning and tingling, currently rates as 10 out of 10. Patient reports some pain relief with use of heating pad, oral medications and topical pain creams. Patient did attend formal physical therapy at [facility] recently and reports some relief of pain with these treatments. Patient states she did complete several sessions of formal PT, however she had to stop going due to financial issues. Patient's recent cervical MRI exhibits multi-level facet arthrosis, multifactorial minimal relative spinal canal narrowing at C5-C6 and C6-C7, no high grade spinal canal stenosis noted. Patient states she works at [employer] and frequently lifts and carries heavy objects which she reports makes her pain significantly worse. Patient states she is not working 12 hour shifts 5 days a week for the holiday season.   Incidentally, patient also mentioned bilateral lower back pain radiating to buttocks for the last several months. Patient states lower back pain is exacerbated by movement and activity, describes as a soreness sensation, currently rates as 6 out of 10. Patient reports some relief of back pain with use of brace, rest and sitting. Patient also reports she recently started experiencing "crunching" sensation to lower back with bending and sitting. Patient states most severe  pain at this time is her neck but wanted to mention her back discomfort as well.   Patient denies focal weakness, numbness and tingling. Patient denies recent trauma or falls.   Review of Systems  Musculoskeletal:  Positive for back pain and neck pain.  Neurological:  Positive for tingling. Negative for focal weakness and weakness.  All other systems reviewed and are negative. Otherwise per HPI.  Assessment & Plan: Visit Diagnoses:    ICD-10-CM   1. Cervicalgia  M54.2 Ambulatory referral to Physical Medicine Rehab    2. Radiculopathy, cervical region  M54.12 Ambulatory referral to Physical Medicine Rehab    3. Myofascial pain syndrome  M79.18        Plan: Findings:  Chronic, worsening and severe midline neck pain radiating to left shoulder and down arm to thumb, 1st and 2nd digits. Patient continues to have excruciating pain despite good conservative therapies such as formal physical therapy, heating pad, oral medications and topical pain creams. Patient's clinical presentation and exam are consistent with C6 nerve pattern. We believe the next step is to perform diagnostic and hopefully therapeutic left C7-T1 interlaminar epidural steroid injection under fluoroscopic guidance. We did discuss epidural injection procedure in detail with patient today.    Meds & Orders:  Meds ordered this encounter  Medications   diazepam (VALIUM) 5 MG tablet    Sig: Take one tablet by mouth with food one hour prior to procedure. May repeat 30 minutes prior if needed.    Dispense:  2 tablet    Refill:  0    Order Specific Question:   Supervising  Provider    Answer:   Magnus Sinning [[id number]]     Orders Placed This Encounter  Procedures   Ambulatory referral to Physical Medicine Rehab     Follow-up: Return for left C7-T1 interlaminar epidural steroid injection.   Procedures: No procedures performed      Clinical History: MRI CERVICAL SPINE WITHOUT CONTRAST   TECHNIQUE: Multiplanar,  multisequence MR imaging of the cervical spine was performed. No intravenous contrast was administered.   COMPARISON:  Radiographs of the cervical spine 09/05/2021 (images available, report unavailable). Cervical spine 08/19/2018.   FINDINGS: Alignment: Straightening of the expected cervical lordosis. No significant spondylolisthesis.   Vertebrae: Vertebral body height is maintained. Mild multilevel degenerative endplate irregularity and degenerative endplate edema, greatest at C5-C6 and C6-C7. Otherwise, no significant marrow edema or focal suspicious osseous lesion is identified.   Cord: T2 hyperintensity within the central spinal cord (measuring less than 1 mm in width), likely reflecting slight prominence of the central canal.   Posterior Fossa, vertebral arteries, paraspinal tissues: No abnormality identified within included portions of the posterior fossa. Flow voids preserved within the imaged cervical vertebral arteries. No paraspinal soft tissue mass. Subcentimeter right thyroid lobe nodule not meeting consensus criteria for ultrasound follow-up based on size.   Disc levels:   Multilevel disc degeneration, greatest at C5-C6 and C6-C7 (mild to moderate at these levels).   C2-C3: Shallow disc bulge. No significant spinal canal or foraminal stenosis.   C3-C4: Shallow disc bulge. Mild uncovertebral hypertrophy (greater on the left). No significant spinal canal or foraminal stenosis.   C4-C5: Shallow disc bulge. Minimal facet arthrosis on the left. No significant spinal canal or foraminal stenosis.   C5-C6: Disc bulge with bilateral disc osteophyte ridge/uncinate hypertrophy. Mild facet arthrosis (predominantly on the left). Minimal relative spinal canal narrowing (without spinal cord mass effect). Bilateral neural foraminal narrowing (mild right, moderate left).   C6-C7: Disc bulge with right greater than left disc osteophyte ridge/uncinate hypertrophy. Minimal  relative spinal canal narrowing (without spinal cord mass effect). Moderate right neural foraminal narrowing.   C7-T1: Shallow disc bulge. Facet arthrosis (predominantly on the left). No significant spinal canal or foraminal stenosis.   IMPRESSION: Cervical spondylosis, as outlined with findings most notably as follows.   At C5-C6, there is mild-to-moderate disc degeneration with mild degenerative endplate edema. Multifactorial minimal relative spinal canal narrowing (without spinal cord mass effect). Bilateral neural foraminal narrowing (mild right, moderate left).   At C6-C7, there is mild-to-moderate disc degeneration with mild degenerative endplate edema. Multifactorial minimal relative spinal canal narrowing (without spinal cord mass effect). Moderate right neural foraminal narrowing.   No significant spinal canal or foraminal stenosis at the remaining levels.     Electronically Signed   By: Kellie Simmering D.O.   On: 09/24/2021 13:41   She reports that she quit smoking about 10 years ago. Her smoking use included cigarettes. She has a 8.50 pack-year smoking history. She has never used smokeless tobacco. No results for input(s): HGBA1C, LABURIC in the last 8760 hours.  Objective:  VS:  HT:    WT:   BMI:     BP:   HR: bpm  TEMP: ( )  RESP:  Physical Exam Vitals and nursing note reviewed.  HENT:     Head: Normocephalic and atraumatic.     Right Ear: External ear normal.     Left Ear: External ear normal.     Nose: Nose normal.     Mouth/Throat:     Mouth:  Mucous membranes are moist.  Eyes:     Extraocular Movements: Extraocular movements intact.  Cardiovascular:     Rate and Rhythm: Normal rate.     Pulses: Normal pulses.  Pulmonary:     Effort: Pulmonary effort is normal.  Abdominal:     General: Abdomen is flat. There is no distension.  Musculoskeletal:        General: Tenderness present.     Cervical back: Tenderness present.     Comments: Discomfort  noted with flexion, extension and side-to-side rotation. Patient has good strength in the upper extremities including 5 out of 5 strength in wrist extension, long finger flexion and APB. There is no atrophy of the hands intrinsically.  Sensation intact bilaterally. Dysesthesias noted to left C6 dermatome. Negative Hoffman's sign. Equivocally Spurling's sign.    Pt is slow to rise from seated position to standing. Concordant low back pain with facet loading, lumbar spine extension and rotation. Strong distal strength without clonus, no pain upon palpation of greater trochanters. Sensation intact bilaterally. Tenderness noted upon touch of left neck/shoulder region. Walks independently, gait steady.     Skin:    General: Skin is warm and dry.     Capillary Refill: Capillary refill takes less than 2 seconds.  Neurological:     General: No focal deficit present.     Mental Status: She is alert and oriented to person, place, and time.  Psychiatric:        Mood and Affect: Mood normal.    Ortho Exam  Imaging: No results found.  Past Medical/Family/Surgical/Social History: Medications & Allergies reviewed per EMR, new medications updated. 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    Allergy    Anemia    Asthma    GERD (gastroesophageal reflux disease)    Heart murmur    Hypertension    Positive TB test    Shingles    TOBACCO USE 03/22/2009   Qualifier: Diagnosis of  By: Regis Bill MD, [name]  Stopped Dec 12 2010     Family History  Problem Relation Age of Onset   Hypertension Mother    Alzheimer's  disease Mother    Hypertension Father    Prostate cancer Father    HIV Brother    Lupus Sister    Colon cancer Paternal Grandmother    Esophageal cancer Neg Hx    Liver cancer Neg Hx    Pancreatic cancer Neg Hx    Rectal cancer Neg Hx    Stomach cancer Neg Hx    Past Surgical History:  Procedure Laterality Date   ABDOMINAL HYSTERECTOMY     BREAST SURGERY     cyst from left breast   BUNIONECTOMY     both feet   CHOLECYSTECTOMY     CYSTECTOMY     left shoulder   KNEE SURGERY Left    PANENDOSCOPY     TUBAL LIGATION     uterine ablastion     and polyps removed   Uterine polyps     Social History   Occupational History  Not on file  Tobacco Use   Smoking status: Former    Packs/day: 0.50    Years: 17.00    Pack years: 8.50    Types: Cigarettes    Quit date: 12/12/2010    Years since quitting: 10.8   Smokeless tobacco: Never  Vaping Use   Vaping Use: Never used  Substance and Sexual Activity   Alcohol use: No   Drug use: No   Sexual activity: Not on file

## 2021-10-09 NOTE — Progress Notes (Signed)
Patient reports she is here today to discuss her neck pain and back pain. Neck has been bothersome for the last 6-8 months. Prior injury to her neck in 1988 but no problems until recent.  Reports radicular arm pain down both arms and into the back of her head. Reports N/T in bilateral upper extremities.  States she has history of bulging disc in her lumbar spine and lately has been very painful and reports her back feels "crunchy".

## 2021-10-11 ENCOUNTER — Telehealth: Payer: Self-pay | Admitting: Physical Medicine and Rehabilitation

## 2021-10-11 NOTE — Telephone Encounter (Signed)
Pt called stating she would like to see if she could get a sooner appt than her current 11/12/21 appt. She states if possible she'd like to get in before the end of the year. Pt would like a CB.   [phone]

## 2021-10-15 ENCOUNTER — Encounter: Payer: Self-pay | Admitting: Family Medicine

## 2021-10-17 ENCOUNTER — Emergency Department (HOSPITAL_COMMUNITY): Payer: BC Managed Care – PPO

## 2021-10-17 ENCOUNTER — Emergency Department (HOSPITAL_BASED_OUTPATIENT_CLINIC_OR_DEPARTMENT_OTHER)
Admission: EM | Admit: 2021-10-17 | Discharge: 2021-10-17 | Disposition: A | Discharge status: Home / Self Care | Payer: BC Managed Care – PPO | Source: Home / Self Care | Attending: Emergency Medicine | Admitting: Emergency Medicine

## 2021-10-17 ENCOUNTER — Encounter (HOSPITAL_BASED_OUTPATIENT_CLINIC_OR_DEPARTMENT_OTHER): Payer: Self-pay | Admitting: Emergency Medicine

## 2021-10-17 ENCOUNTER — Emergency Department (HOSPITAL_COMMUNITY)
Admission: EM | Admit: 2021-10-17 | Discharge: 2021-10-17 | Disposition: A | Discharge status: Home / Self Care | Payer: BC Managed Care – PPO | Attending: Emergency Medicine | Admitting: Emergency Medicine

## 2021-10-17 ENCOUNTER — Other Ambulatory Visit: Payer: Self-pay

## 2021-10-17 ENCOUNTER — Encounter (HOSPITAL_COMMUNITY): Payer: Self-pay

## 2021-10-17 DIAGNOSIS — Z20822 Contact with and (suspected) exposure to covid-19: Secondary | ICD-10-CM | POA: Diagnosis not present

## 2021-10-17 DIAGNOSIS — R0602 Shortness of breath: Secondary | ICD-10-CM | POA: Diagnosis not present

## 2021-10-17 DIAGNOSIS — Z87891 Personal history of nicotine dependence: Secondary | ICD-10-CM | POA: Insufficient documentation

## 2021-10-17 DIAGNOSIS — Z7952 Long term (current) use of systemic steroids: Secondary | ICD-10-CM | POA: Insufficient documentation

## 2021-10-17 DIAGNOSIS — Z91010 Allergy to peanuts: Secondary | ICD-10-CM | POA: Insufficient documentation

## 2021-10-17 DIAGNOSIS — Z03818 Encounter for observation for suspected exposure to other biological agents ruled out: Secondary | ICD-10-CM | POA: Diagnosis not present

## 2021-10-17 DIAGNOSIS — R059 Cough, unspecified: Secondary | ICD-10-CM | POA: Diagnosis not present

## 2021-10-17 DIAGNOSIS — J029 Acute pharyngitis, unspecified: Secondary | ICD-10-CM | POA: Insufficient documentation

## 2021-10-17 DIAGNOSIS — I10 Essential (primary) hypertension: Secondary | ICD-10-CM | POA: Insufficient documentation

## 2021-10-17 DIAGNOSIS — R079 Chest pain, unspecified: Secondary | ICD-10-CM

## 2021-10-17 DIAGNOSIS — J069 Acute upper respiratory infection, unspecified: Secondary | ICD-10-CM

## 2021-10-17 DIAGNOSIS — J45909 Unspecified asthma, uncomplicated: Secondary | ICD-10-CM | POA: Insufficient documentation

## 2021-10-17 DIAGNOSIS — Z5321 Procedure and treatment not carried out due to patient leaving prior to being seen by health care provider: Secondary | ICD-10-CM | POA: Insufficient documentation

## 2021-10-17 DIAGNOSIS — B9789 Other viral agents as the cause of diseases classified elsewhere: Secondary | ICD-10-CM | POA: Diagnosis not present

## 2021-10-17 DIAGNOSIS — R0789 Other chest pain: Secondary | ICD-10-CM | POA: Diagnosis not present

## 2021-10-17 DIAGNOSIS — Z28310 Unvaccinated for covid-19: Secondary | ICD-10-CM | POA: Insufficient documentation

## 2021-10-17 LAB — RESP PANEL BY RT-PCR (FLU A&B, COVID) ARPGX2
Influenza A by PCR: NEGATIVE
Influenza B by PCR: NEGATIVE
SARS Coronavirus 2 by RT PCR: NEGATIVE

## 2021-10-17 LAB — BASIC METABOLIC PANEL
Anion gap: 9 (ref 5–15)
BUN: 13 mg/dL (ref 6–20)
CO2: 27 mmol/L (ref 22–32)
Calcium: 9.1 mg/dL (ref 8.9–10.3)
Chloride: 103 mmol/L (ref 98–111)
Creatinine, Ser: 1.08 mg/dL — ABNORMAL HIGH (ref 0.44–1.00)
GFR, Estimated: >60 mL/min (ref >60)
Glucose, Bld: 117 mg/dL — ABNORMAL HIGH (ref 70–99)
Potassium: 3.2 mmol/L — ABNORMAL LOW (ref 3.5–5.1)
Sodium: 139 mmol/L (ref 135–145)

## 2021-10-17 LAB — CBC
HCT: 40.7 % (ref 36.0–46.0)
Hemoglobin: 12.9 g/dL (ref 12.0–15.0)
MCH: 27.7 pg (ref 26.0–34.0)
MCHC: 31.7 g/dL (ref 30.0–36.0)
MCV: 87.3 fL (ref 80.0–100.0)
Platelets: 311 10*3/uL (ref 150–400)
RBC: 4.66 MIL/uL (ref 3.87–5.11)
RDW: 15.5 % (ref 11.5–15.5)
WBC: 7.5 10*3/uL (ref 4.0–10.5)
nRBC: 0 % (ref 0.0–0.2)

## 2021-10-17 LAB — I-STAT BETA HCG BLOOD, ED (MC, WL, AP ONLY): I-stat hCG, quantitative: <5 m[IU]/mL (ref <5)

## 2021-10-17 LAB — TROPONIN I (HIGH SENSITIVITY)
Troponin I (High Sensitivity): 2 ng/L (ref <18)
Troponin I (High Sensitivity): 2 ng/L (ref <18)

## 2021-10-17 LAB — GROUP A STREP BY PCR: Group A Strep by PCR: NOT DETECTED

## 2021-10-17 MED ORDER — PREDNISONE 10 MG PO TABS
20.0000 mg | ORAL_TABLET | Freq: Every day | ORAL | 0 refills | Status: DC
  Filled 2021-10-17 – 2021-10-19 (×2): qty 20, 10d supply, fill #0

## 2021-10-17 MED ORDER — ONDANSETRON 4 MG PO TBDP
4.0000 mg | ORAL_TABLET | Freq: Once | ORAL | Status: AC
  Administered 2021-10-17: 21:00:00 4 mg via ORAL
  Filled 2021-10-17: qty 1

## 2021-10-17 MED ORDER — ALUM & MAG HYDROXIDE-SIMETH 200-200-20 MG/5ML PO SUSP
30.0000 mL | Freq: Once | ORAL | Status: DC
  Filled 2021-10-17: qty 30

## 2021-10-17 MED ORDER — LIDOCAINE VISCOUS HCL 2 % MT SOLN
15.0000 mL | Freq: Once | OROMUCOSAL | Status: DC
  Filled 2021-10-17: qty 15

## 2021-10-17 MED ORDER — ACETAMINOPHEN 325 MG PO TABS
650.0000 mg | ORAL_TABLET | Freq: Once | ORAL | Status: AC
  Administered 2021-10-17: 21:00:00 650 mg via ORAL
  Filled 2021-10-17: qty 2

## 2021-10-17 MED ORDER — IPRATROPIUM-ALBUTEROL 0.5-2.5 (3) MG/3ML IN SOLN
RESPIRATORY_TRACT | Status: AC
  Administered 2021-10-17: 21:00:00 3 mL via RESPIRATORY_TRACT
  Filled 2021-10-17: qty 3

## 2021-10-17 MED ORDER — IPRATROPIUM-ALBUTEROL 0.5-2.5 (3) MG/3ML IN SOLN
3.0000 mL | Freq: Once | RESPIRATORY_TRACT | Status: AC
  Administered 2021-10-17: 21:00:00 3 mL via RESPIRATORY_TRACT

## 2021-10-17 NOTE — ED Triage Notes (Signed)
Pt endorses cough and sore throat that began yesterday. She reports feeling chest pressure and SHOB today. Denies abdominal pain, N/V/D. Reports hx of asthma.

## 2021-10-17 NOTE — ED Triage Notes (Signed)
Sore throat, chest pain "like someone is sitting on it." Cough. States onset yesterday. Reports low grade fevers.

## 2021-10-17 NOTE — Discharge Instructions (Addendum)
Take prednisone for the next 5 days.  Take 40 mg daily, you can take 20 in the morning and 20 in the evening. Use the albuterol inhaler as needed for shortness of breath. Take Tylenol and Motrin for fever and body aches. Return to the ED if you are unable to swallow, feel worsening shortness of breath. Return to recommend if you are feeling better.

## 2021-10-17 NOTE — ED Provider Notes (Signed)
Emergency Medicine Provider Triage Evaluation Note  [name] , a 55 y.o. female  was evaluated in triage.  Pt complains of cold sxs.  Review of Systems  Positive: Sore throat, cough, chest pressure, sob, chills Negative: Fever, n/v/d  Physical Exam  BP (!) 156/100 (BP Location: Right Arm)    Pulse 79    Temp 98.2 F (36.8 C) (Oral)    Resp 16    SpO2 100%  Gen:   Awake, no distress   Resp:  Normal effort  MSK:   Moves extremities without difficulty  Other:  Throat exam unremarkable  Medical Decision Making  Medically screening exam initiated at 11:48 AM.  Appropriate orders placed.  [name] was informed that the remainder of the evaluation will be completed by another provider, this initial triage assessment does not replace that evaluation, and the importance of remaining in the ED until their evaluation is complete.  Cold sxs since yesterday, neg covid test at home.     Domenic Moras, PA-C 10/17/21 1153    [name], MD 10/18/21 [phone]

## 2021-10-17 NOTE — ED Notes (Signed)
Pt verbalizes understanding of discharge instructions. Opportunity for questioning and answers were provided. Pt discharged from ED to home.   ? ?

## 2021-10-17 NOTE — ED Provider Notes (Signed)
[facility] EMERGENCY DEPT Provider Note   CSN: [id number] Arrival date & time: 10/17/21  1900     History Chief Complaint  Patient presents with   Sore Throat    Granger is a 55 y.o. female.   Sore Throat Associated symptoms include chest pain and shortness of breath.   Patient with history of hypertension, previous tobacco use, quit 2012, GERD, asthma presents due to sore throat, chest pain and shortness of breath.  Yesterday she started feeling sore in the throat, pain is worse when she swallows.  Also had congestion, cough, body aches.  Coworkers have similar symptoms, patient is not flu or COVID vaccinated.  Takes medicine daily for asthma, has not needed the rescue inhaler in front some time.  Has not had today or yesterday.  Chest pain started this morning, its been constant.  The pain is central chest, feels like something is sitting on it.  Does not radiate elsewhere, worse when she coughs.  There is associated nausea but no vomiting, no radiation to the back or to the arm or jaw.  No previous history of MI or blood clots, takes medicine hypertension but does not have a history of diabetes, CAD, hyperlipidemia.  Has not had anything that has alleviated the pain.  Patient was initially seen at [facility], she was triaged but was not seen in the back and left due to wait times.  She comes here straight from there, previous labs and chest x-ray are available.  She was tested for flu and COVID and strep, all of which resulted negative.  Past Medical History:  Diagnosis Date   Allergic reaction caused by a drug 01/25/2013   probably.    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     Family History  Problem Relation Age of Onset   Hypertension Mother    Alzheimer's disease Mother    Hypertension Father    Prostate cancer Father    HIV Brother    Lupus Sister    Colon cancer Paternal Grandmother    Esophageal cancer Neg Hx    Liver cancer Neg Hx    Pancreatic cancer Neg Hx    Rectal cancer Neg Hx    Stomach cancer Neg Hx  Social History   Tobacco Use   Smoking status: Former    Packs/day: 0.50    Years: 17.00    Pack years: 8.50    Types: Cigarettes    Quit date: 12/12/2010    Years since quitting: 10.8   Smokeless tobacco: Never  Vaping Use   Vaping Use: Never used  Substance Use Topics   Alcohol use: No   Drug use: No    Home Medications Prior to Admission medications   Medication Sig Start Date End Date Taking? Authorizing Provider  albuterol (PROVENTIL) (2.5 MG/3ML) 0.083% nebulizer solution Inhale 3 mLs (2.5 mg total) by nebulization every 6 (six) hours as needed for wheezing or shortness of breath. 06/01/21   [name], MD  albuterol (VENTOLIN HFA) 108 (90 Base) MCG/ACT inhaler Inhale 2 puffs into the lungs every 4 (four) hours as needed. 07/17/21   [name], MD  cyclobenzaprine (FLEXERIL) 10 MG tablet Take 1 tablet by mouth at bedtime as needed. 09/05/21   Meredith Pel, MD  diazepam (VALIUM) 5  MG tablet Take one tablet by mouth with food one hour prior to procedure. May repeat 30 minutes prior if needed. 10/09/21   Lorine Bears, NP  EPINEPHRINE 0.3 mg/0.3 mL IJ SOAJ injection INJECT 1 SYRINGE INTO THE MUSCLE AS NEEDED FOR ANAPHYLAXIS Patient taking differently: Inject 0.3 mg into the muscle as needed for anaphylaxis. 08/19/19   [name], [name], MD  famotidine (PEPCID) 20 MG tablet One at bedtime Patient taking differently: Take 20 mg by mouth at bedtime. One at bedtime 11/11/18   [name], MD  fluticasone [facility]) 50 MCG/ACT nasal spray USE 2 SPRAYS IN [facility] NOSTRIL DAILY 04/13/21   [name], [name], MD  hydrochlorothiazide (HYDRODIURIL) 25 MG tablet Take 1 tablet (25 mg total) by mouth daily. 07/18/21   [name], [name], MD  ibuprofen (ADVIL) 200 MG tablet Take 400 mg by mouth every 6 (six) hours as needed for moderate pain.    [provider]  methocarbamol (ROBAXIN) 500 MG tablet Take 1 tablet (500 mg total) by mouth every 8 (eight) hours as needed for muscle spasms. 09/08/20   [name], MD  mometasone-formoterol [facility]) 100-5 MCG/ACT AERO Take 2 puffs first thing in  the morning and then another 2 puffs about 12 hours later. 07/17/21   [name], MD  ofloxacin (FLOXIN) 0.3 % OTIC solution Place 10 drops into the right ear daily. 01/09/21   [name], [name], MD  ondansetron (ZOFRAN ODT) 4 MG disintegrating tablet Take 1 tablet (4 mg total) by mouth every 8 (eight) hours as needed for nausea or vomiting. 03/27/21   Scot Jun, FNP  pantoprazole (PROTONIX) 40 MG tablet TAKE ONE TABLET BY MOUTH TWICE DAILY BEFORE MEALS. TAKE 30 TO 60 MINUTES BEFORE FIRST MEAL OF THE DAY 06/26/20   [name], MD  predniSONE (STERAPRED UNI-PAK 48 TAB) 10 MG (48) TBPK tablet Take as directed package instructions for 12 days 06/15/21   Gregor Hams, MD  traMADol (ULTRAM) 50 MG tablet Take 1 tablet by mouth every 8 hours as needed for severe pain. 07/28/21   Gregor Hams, MD   valACYclovir (VALTREX) 1000 MG tablet Take 1 tablet (1,000 mg total) by mouth 3 (three) times daily. For shingles 01/09/21   [name], [name], MD  valsartan (DIOVAN) 320 MG tablet Take 0.5 tablets (160 mg total) by mouth daily. 07/18/21   [name], [name], MD  VITAMIN D  PO Take 1 capsule by mouth daily.    [provider]    Allergies    Allegra [fexofenadine]; Cimetidine; Diovan hct [valsartan-hydrochlorothiazide]; Purified water [water, sterile]; Chocolate; Citric acid; Coly-mycin s; Peanut-containing drug products; Valsartan; and Amlodipine  Review of Systems   Review of Systems  Constitutional:  Positive for fever.  HENT:  Positive for congestion and sore throat. Negative for drooling and voice change.   Respiratory:  Positive for cough and shortness of breath.   Cardiovascular:  Positive for chest pain.  Gastrointestinal:  Positive for nausea. Negative for vomiting.  Musculoskeletal:  Positive for myalgias. Negative for back pain.  Neurological:  Negative for syncope.   Physical Exam Updated Vital Signs BP (!) 148/97    Pulse 71    Temp 98.8 F (37.1 C)    Resp 18    Ht 5' (1.524 m)    Wt 66.7 kg    SpO2 99%    BMI 28.71 kg/m   Physical Exam Vitals and nursing note reviewed. Exam conducted with a chaperone present.  Constitutional:      Appearance: Normal appearance.  HENT:     Head: Normocephalic and atraumatic.     Nose: Congestion present.     Mouth/Throat:     Pharynx: Uvula midline.     Tonsils: No tonsillar exudate or tonsillar abscesses.     Comments: Uvula is midline, no erythema.  Handling secretions appropriately, no sublingual or submandibular tenderness or swelling.  No trismus. Eyes:     General: No scleral icterus.       Right eye: No discharge.        Left eye: No discharge.     Extraocular Movements: Extraocular movements intact.     Pupils: Pupils are equal, round, and reactive to light.  Cardiovascular:     Rate and Rhythm: Normal rate and  regular rhythm.     Pulses: Normal pulses.     Heart sounds: Normal heart sounds. No murmur heard.   No friction rub. No gallop.     Comments: S1-S2, radial pulse 2+ equal bilaterally Pulmonary:     Effort: Pulmonary effort is normal. No respiratory distress.     Breath sounds: Normal breath sounds.     Comments: Lungs are clear to auscultation, no wheezing.  No tachypnea or accessory muscle use Abdominal:     General: Abdomen is flat. Bowel sounds are normal. There is no distension.     Palpations: Abdomen is soft.     Tenderness: There is no abdominal tenderness.  Musculoskeletal:     Cervical back: Normal range of motion.  Skin:    General: Skin is warm and dry.     Coloration: Skin is not jaundiced.  Neurological:     Mental Status: She is alert. Mental status is at baseline.     Coordination: Coordination normal.    ED Results / Procedures / Treatments   Labs (all labs ordered are listed, but only abnormal results are displayed) Labs Reviewed  TROPONIN I (HIGH SENSITIVITY)    EKG None  Radiology DG Chest 2 View  Result Date: 10/17/2021 CLINICAL DATA:  Chest pain with shortness of breath, cough and sore throat since yesterday. EXAM: CHEST - 2 VIEW COMPARISON:  Radiographs 09/08/2020.  CT 12/11/2019. FINDINGS: The heart size and mediastinal contours are stable. There is stable linear scarring in the lingula. The lungs are otherwise clear. No pleural effusion or pneumothorax. The bones appear unchanged. Telemetry leads overlie the chest. IMPRESSION:  Stable chest.  No evidence of active cardiopulmonary process. Electronically Signed   By: Richardean Sale M.D.   On: 10/17/2021 11:58    Procedures Procedures   Medications Ordered in ED Medications  ipratropium-albuterol (DUONEB) 0.5-2.5 (3) MG/3ML nebulizer solution (has no administration in time range)  alum & mag hydroxide-simeth (MAALOX/MYLANTA) 200-200-20 MG/5ML suspension 30 mL (has no administration in time range)     And  lidocaine (XYLOCAINE) 2 % viscous mouth solution 15 mL (has no administration in time range)  ondansetron (ZOFRAN-ODT) disintegrating tablet 4 mg (has no administration in time range)  acetaminophen (TYLENOL) tablet 650 mg (has no administration in time range)  ipratropium-albuterol (DUONEB) 0.5-2.5 (3) MG/3ML nebulizer solution 3 mL (3 mLs Nebulization Given 10/17/21 2056)    ED Course  I have reviewed the triage vital signs and the nursing notes.  Pertinent labs & imaging results that were available during my care of the patient were reviewed by me and considered in my medical decision making (see chart for details).    MDM Rules/Calculators/A&P                           Stable vitals, patient is nontoxic.  Previous labs and imaging from earlier today reviewed.  Patient tested negative for COVID, flu, strep throat.  Initial troponin was low at 2, will get a second troponin.  No gross electrolyte derangement on the BMP, patient was hypokalemic at 3.2.  Slight bump in creatinine at 1.08.  No leukocytosis, no anemia.  Radiograph ordered and negative for pneumonia, also no cardiomegaly or widening of the mediastinum.  EKG with LVH and right atrial enlargement, no ST elevations concerning for ACS.  Additionally no arrhythmia or palpitations noted.  Patient symptoms seem to be consistent with a viral URI.  We will check a second troponin to fully evaluate for ACS.  Patient vitals remained stable, she is not hypoxic or tachypneic.  Respiratory in the room, although patient does not have a presentation consistent with an asthma exacerbation they elected for DuoNeb which I think is reasonable given patient feels subjectively short of breath.  Patient reports improvement of her chest tightness with the DuoNeb treatment.  Negative delta troponin.  Do not think ACS.  Considered PE, given she has not been tachycardic or hypoxic I think that is less likely.  Suspect her symptoms are secondary to a  viral URI.  Discussed with patient, she is discharged in stable condition.  Final Clinical Impression(s) / ED Diagnoses Final diagnoses:  None    Rx / DC Orders ED Discharge Orders     None        Sherrill Raring, Hershal Coria 10/17/21 2208    Luna Fuse, MD 10/21/21 [id number]

## 2021-10-18 ENCOUNTER — Other Ambulatory Visit (HOSPITAL_COMMUNITY): Payer: Self-pay

## 2021-10-19 ENCOUNTER — Other Ambulatory Visit (HOSPITAL_COMMUNITY): Payer: Self-pay

## 2021-10-21 ENCOUNTER — Other Ambulatory Visit: Payer: Self-pay

## 2021-10-21 ENCOUNTER — Encounter (HOSPITAL_BASED_OUTPATIENT_CLINIC_OR_DEPARTMENT_OTHER): Payer: Self-pay

## 2021-10-21 ENCOUNTER — Emergency Department (HOSPITAL_BASED_OUTPATIENT_CLINIC_OR_DEPARTMENT_OTHER): Payer: BC Managed Care – PPO

## 2021-10-21 ENCOUNTER — Emergency Department (HOSPITAL_BASED_OUTPATIENT_CLINIC_OR_DEPARTMENT_OTHER)
Admission: EM | Admit: 2021-10-21 | Discharge: 2021-10-21 | Disposition: A | Discharge status: Home / Self Care | Payer: BC Managed Care – PPO | Attending: Emergency Medicine | Admitting: Emergency Medicine

## 2021-10-21 ENCOUNTER — Emergency Department (HOSPITAL_BASED_OUTPATIENT_CLINIC_OR_DEPARTMENT_OTHER)
Admission: EM | Admit: 2021-10-21 | Discharge: 2021-10-21 | Disposition: A | Discharge status: Home / Self Care | Payer: BC Managed Care – PPO | Source: Home / Self Care | Attending: Emergency Medicine | Admitting: Emergency Medicine

## 2021-10-21 DIAGNOSIS — Z91010 Allergy to peanuts: Secondary | ICD-10-CM | POA: Insufficient documentation

## 2021-10-21 DIAGNOSIS — B349 Viral infection, unspecified: Secondary | ICD-10-CM

## 2021-10-21 DIAGNOSIS — J45991 Cough variant asthma: Secondary | ICD-10-CM | POA: Diagnosis not present

## 2021-10-21 DIAGNOSIS — Z79899 Other long term (current) drug therapy: Secondary | ICD-10-CM | POA: Diagnosis not present

## 2021-10-21 DIAGNOSIS — R059 Cough, unspecified: Secondary | ICD-10-CM | POA: Diagnosis not present

## 2021-10-21 DIAGNOSIS — J45909 Unspecified asthma, uncomplicated: Secondary | ICD-10-CM | POA: Insufficient documentation

## 2021-10-21 DIAGNOSIS — Z87891 Personal history of nicotine dependence: Secondary | ICD-10-CM | POA: Insufficient documentation

## 2021-10-21 DIAGNOSIS — R0602 Shortness of breath: Secondary | ICD-10-CM | POA: Diagnosis not present

## 2021-10-21 DIAGNOSIS — I10 Essential (primary) hypertension: Secondary | ICD-10-CM | POA: Insufficient documentation

## 2021-10-21 DIAGNOSIS — J069 Acute upper respiratory infection, unspecified: Secondary | ICD-10-CM

## 2021-10-21 DIAGNOSIS — J9811 Atelectasis: Secondary | ICD-10-CM | POA: Diagnosis not present

## 2021-10-21 DIAGNOSIS — B9789 Other viral agents as the cause of diseases classified elsewhere: Secondary | ICD-10-CM | POA: Insufficient documentation

## 2021-10-21 DIAGNOSIS — Z20822 Contact with and (suspected) exposure to covid-19: Secondary | ICD-10-CM | POA: Insufficient documentation

## 2021-10-21 DIAGNOSIS — R051 Acute cough: Secondary | ICD-10-CM

## 2021-10-21 LAB — CBC WITH DIFFERENTIAL/PLATELET
Abs Immature Granulocytes: 0.02 10*3/uL (ref 0.00–0.07)
Basophils Absolute: 0 10*3/uL (ref 0.0–0.1)
Basophils Relative: 1 %
Eosinophils Absolute: 0 10*3/uL (ref 0.0–0.5)
Eosinophils Relative: 1 %
HCT: 36.5 % (ref 36.0–46.0)
Hemoglobin: 11.9 g/dL — ABNORMAL LOW (ref 12.0–15.0)
Immature Granulocytes: 0 %
Lymphocytes Relative: 19 %
Lymphs Abs: 1.1 10*3/uL (ref 0.7–4.0)
MCH: 27.9 pg (ref 26.0–34.0)
MCHC: 32.6 g/dL (ref 30.0–36.0)
MCV: 85.5 fL (ref 80.0–100.0)
Monocytes Absolute: 0.4 10*3/uL (ref 0.1–1.0)
Monocytes Relative: 6 %
Neutro Abs: 4.4 10*3/uL (ref 1.7–7.7)
Neutrophils Relative %: 73 %
Platelets: 303 10*3/uL (ref 150–400)
RBC: 4.27 MIL/uL (ref 3.87–5.11)
RDW: 15.1 % (ref 11.5–15.5)
WBC: 5.9 10*3/uL (ref 4.0–10.5)
nRBC: 0 % (ref 0.0–0.2)

## 2021-10-21 LAB — BASIC METABOLIC PANEL
Anion gap: 7 (ref 5–15)
BUN: 19 mg/dL (ref 6–20)
CO2: 29 mmol/L (ref 22–32)
Calcium: 9.3 mg/dL (ref 8.9–10.3)
Chloride: 101 mmol/L (ref 98–111)
Creatinine, Ser: 0.88 mg/dL (ref 0.44–1.00)
GFR, Estimated: >60 mL/min (ref >60)
Glucose, Bld: 121 mg/dL — ABNORMAL HIGH (ref 70–99)
Potassium: 3.8 mmol/L (ref 3.5–5.1)
Sodium: 137 mmol/L (ref 135–145)

## 2021-10-21 LAB — RESP PANEL BY RT-PCR (FLU A&B, COVID) ARPGX2
Influenza A by PCR: NEGATIVE
Influenza B by PCR: NEGATIVE
SARS Coronavirus 2 by RT PCR: NEGATIVE

## 2021-10-21 MED ORDER — IOHEXOL 350 MG/ML SOLN
100.0000 mL | Freq: Once | INTRAVENOUS | Status: AC | PRN
  Administered 2021-10-21: 20:00:00 100 mL via INTRAVENOUS

## 2021-10-21 MED ORDER — ACETAMINOPHEN 325 MG PO TABS
650.0000 mg | ORAL_TABLET | Freq: Once | ORAL | Status: AC
  Administered 2021-10-21: 14:00:00 650 mg via ORAL
  Filled 2021-10-21: qty 2

## 2021-10-21 NOTE — ED Provider Notes (Signed)
[facility] EMERGENCY DEPT Provider Note   CSN: [id number] Arrival date & time: 10/21/21  1326     History Chief Complaint  Patient presents with   Influenza    [name] is a 55 y.o. female.  HPI  55 year old female with medical history significant for hypertension, anemia, asthma, GERD, previously treated TB over a decade ago who presents to the emergency department with URI symptoms.  The patient states that she has had a sore throat, mildly productive cough, body aches for the past week.  She states that her cough is the worst part of her symptoms.  She was seen  for the same on 12/14 and had negative flu, COVID and strep testing at that time.  She denies any chest pain, fevers or chills. She had previously been seen in the ED on 12/14 and had a negative cardiac workup. Since DC, she endorses a persistent cough and shortness of breath, along with muscle aches and myalgias.   Past Medical History:  Diagnosis Date   Allergic reaction caused by a drug 01/25/2013   probably.    Allergy    Anemia    Asthma    GERD (gastroesophageal reflux disease)    Heart murmur    Hypertension    Positive TB test    Shingles    TOBACCO USE 03/22/2009   Qualifier: Diagnosis of  By: Regis Bill MD, [name]  Stopped Dec 12 2010      Patient Active Problem List   Diagnosis Date Noted   Right hip pain 08/13/2019   Greater trochanteric bursitis of both hips 07/01/2019   Headache 07/02/2013   Cough variant asthma 09/29/2009   Essential hypertension 05/30/2009   GERD 05/12/2008   HOT FLASHES 05/12/2008   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   ALLERGIC RHINITIS 08/07/2007    Past Surgical History:  Procedure Laterality Date   ABDOMINAL HYSTERECTOMY     BREAST SURGERY     cyst from left breast   BUNIONECTOMY     both feet   CHOLECYSTECTOMY     CYSTECTOMY     left shoulder   KNEE SURGERY Left    PANENDOSCOPY     TUBAL LIGATION     uterine ablastion     and polyps removed    Uterine polyps       OB History   No obstetric history on file.     Family History  Problem Relation Age of Onset   Hypertension Mother    Alzheimer's disease Mother    Hypertension Father    Prostate cancer Father    HIV Brother    Lupus Sister    Colon cancer Paternal Grandmother    Esophageal cancer Neg Hx    Liver cancer Neg Hx    Pancreatic cancer Neg Hx    Rectal cancer Neg Hx    Stomach cancer Neg Hx     Social History   Tobacco Use   Smoking status: Former    Packs/day: 0.50    Years: 17.00    Pack years: 8.50    Types: Cigarettes    Quit date: 12/12/2010    Years since quitting: 10.8   Smokeless tobacco: Never  Vaping Use   Vaping Use: Never used  Substance Use Topics   Alcohol use: No   Drug use: No    Home Medications Prior to Admission medications   Medication Sig Start Date End Date Taking? Authorizing Provider  albuterol (PROVENTIL) (2.5 MG/3ML) 0.083% nebulizer solution  Inhale 3 mLs (2.5 mg total) by nebulization every 6 (six) hours as needed for wheezing or shortness of breath. 06/01/21   [name], MD  albuterol (VENTOLIN HFA) 108 (90 Base) MCG/ACT inhaler Inhale 2 puffs into the lungs every 4 (four) hours as needed. 07/17/21   [name], MD  cyclobenzaprine (FLEXERIL) 10 MG tablet Take 1 tablet by mouth at bedtime as needed. 09/05/21   Meredith Pel, MD  diazepam (VALIUM) 5 MG tablet Take one tablet by mouth with food one hour prior to procedure. May repeat 30 minutes prior if needed. 10/09/21   Lorine Bears, NP  EPINEPHRINE 0.3 mg/0.3 mL IJ SOAJ injection INJECT 1 SYRINGE INTO THE MUSCLE AS NEEDED FOR ANAPHYLAXIS Patient taking differently: Inject 0.3 mg into the muscle as needed for anaphylaxis. 08/19/19   [name], [name], MD  famotidine (PEPCID) 20 MG tablet One at bedtime Patient taking differently: Take 20 mg by mouth at bedtime. One at bedtime 11/11/18   [name], MD  fluticasone [facility]) 50 MCG/ACT nasal spray USE 2  SPRAYS IN [facility] NOSTRIL DAILY 04/13/21   [name], [name], MD  hydrochlorothiazide (HYDRODIURIL) 25 MG tablet Take 1 tablet (25 mg total) by mouth daily. 07/18/21   [name], [name], MD  ibuprofen (ADVIL) 200 MG tablet Take 400 mg by mouth every 6 (six) hours as needed for moderate pain.    [provider]  methocarbamol (ROBAXIN) 500 MG tablet Take 1 tablet (500 mg total) by mouth every 8 (eight) hours as needed for muscle spasms. 09/08/20   [name], MD  mometasone-formoterol [facility]) 100-5 MCG/ACT AERO Take 2 puffs first thing in  the morning and then another 2 puffs about 12 hours later. 07/17/21   [name], MD  ofloxacin (FLOXIN) 0.3 % OTIC solution Place 10 drops into the right ear daily. 01/09/21   [name], [name], MD  ondansetron (ZOFRAN ODT) 4 MG disintegrating tablet Take 1 tablet (4 mg total) by mouth every 8 (eight) hours as needed for nausea or vomiting. 03/27/21   Scot Jun, FNP  pantoprazole (PROTONIX) 40 MG tablet TAKE ONE TABLET BY MOUTH TWICE DAILY BEFORE MEALS. TAKE 30 TO 60 MINUTES BEFORE FIRST MEAL OF THE DAY 06/26/20   [name], MD  predniSONE (DELTASONE) 10 MG tablet Take 2 tablets by mouth daily. 10/17/21   Sherrill Raring, PA-C  traMADol (ULTRAM) 50 MG tablet Take 1 tablet by mouth every 8 hours as needed for severe pain. 07/28/21   Gregor Hams, MD  valACYclovir (VALTREX) 1000 MG tablet Take 1 tablet (1,000 mg total) by mouth 3 (three) times daily. For shingles 01/09/21   [name], [name], MD  valsartan (DIOVAN) 320 MG tablet Take 0.5 tablets (160 mg total) by mouth daily. 07/18/21   [name], [name], MD  VITAMIN D PO Take 1 capsule by mouth daily.    [provider]    Allergies    Allegra [fexofenadine]; Cimetidine; Diovan hct [valsartan-hydrochlorothiazide]; Purified water [water, sterile]; Chocolate; Citric acid; Coly-mycin s; Peanut-containing drug products; Valsartan; and Amlodipine  Review of Systems   Review of Systems   Constitutional:  Negative for chills and fever.  HENT:  Negative for ear pain and sore throat.   Eyes:  Negative for pain and visual disturbance.  Respiratory:  Positive for cough and shortness of breath.   Cardiovascular:  Negative for chest pain and palpitations.  Gastrointestinal:  Negative for abdominal pain and vomiting.  Genitourinary:  Negative for dysuria and hematuria.  Musculoskeletal:  Negative for arthralgias and back pain.  Skin:  Negative for color change and rash.  Neurological:  Negative for seizures and syncope.  All other systems reviewed and are negative.  Physical Exam Updated Vital Signs BP 111/78 (BP Location: Right Arm)    Pulse 82    Temp 98.6 F (37 C) (Oral)    Resp 17    Ht 5' (1.524 m)    Wt 66.7 kg    SpO2 98%    BMI 28.71 kg/m   Physical Exam Vitals and nursing note reviewed.  Constitutional:      General: She is not in acute distress.    Appearance: She is well-developed. She is ill-appearing.  HENT:     Head: Normocephalic and atraumatic.     Mouth/Throat:     Mouth: Mucous membranes are moist.     Pharynx: No oropharyngeal exudate or posterior oropharyngeal erythema.  Eyes:     Conjunctiva/sclera: Conjunctivae normal.  Cardiovascular:     Rate and Rhythm: Normal rate and regular rhythm.  Pulmonary:     Effort: Pulmonary effort is normal. No respiratory distress.     Breath sounds: Normal breath sounds. No wheezing, rhonchi or rales.  Abdominal:     Palpations: Abdomen is soft.     Tenderness: There is no abdominal tenderness.  Musculoskeletal:        General: No swelling.     Cervical back: Neck supple.  Skin:    General: Skin is warm and dry.     Capillary Refill: Capillary refill takes less than 2 seconds.  Neurological:     Mental Status: She is alert.  Psychiatric:        Mood and Affect: Mood normal.    ED Results / Procedures / Treatments   Labs (all labs ordered are listed, but only abnormal results are displayed) Labs  Reviewed  RESP PANEL BY RT-PCR (FLU A&B, COVID) [id number]    EKG None  Radiology DG Chest Portable 1 View  Result Date: 10/21/2021 CLINICAL DATA:  Cough. EXAM: PORTABLE CHEST 1 VIEW COMPARISON:  October 17, 2021 FINDINGS: The thoracic aorta is more prominent the interval, likely due to portable technique given the short interval follow-up. Cardiomediastinal silhouette is otherwise unchanged. No pneumothorax. Scar atelectasis in the left base. No focal infiltrate. No suspicious nodule or mass. IMPRESSION: 1. Increased prominence of the aorta compared to October 17, 2021 is likely due to the portable technique given the short interval follow-up and the history of cough. AP and lateral chest x-ray could better evaluate if clinically warranted. 2. No focal infiltrate. Electronically Signed   By: Dorise Bullion III M.D.   On: 10/21/2021 14:48    Procedures Procedures   Medications Ordered in ED Medications  acetaminophen (TYLENOL) tablet 650 mg (650 mg Oral Given 10/21/21 1401)    ED Course  I have reviewed the triage vital signs and the nursing notes.  Pertinent labs & imaging results that were available during my care of the patient were reviewed by me and considered in my medical decision making (see chart for details).    MDM Rules/Calculators/A&P                          55 year old female with medical history significant for hypertension, anemia, asthma, GERD, previously treated TB over a decade ago who presents to the emergency department with URI symptoms.  The patient states that  she has had a sore throat, mildly productive cough, body aches for the past week.  She states that her cough is the worst part of her symptoms.  She was seen  for the same on 12/14 and had negative flu, COVID and strep testing at that time.  She denies any chest pain, fevers or chills. She had previously been seen in the ED on 12/14 and had a negative cardiac workup. Since DC, she endorses a persistent  cough and shortness of breath, along with muscle aches and myalgias.   On arrival, the patient was afebrile, not tachycardic or tachypneic, normotensive, saturating well on room air.  Physical exam significant for a mildly ill-appearing woman with a hoarse voice, in no respiratory distress, no wheezing rales or rhonchi noted with lungs clear to auscultation bilaterally.  She has no significant erythema or exudates.  She presents with several days of cough and congestion with symptoms consistent with likely viral illness.  On my exam, the patient is well-appearing and well-hydrated.  The patient's lungs are clear to auscultation bilaterally. Additionally, the patient has a soft/non-tender abdomen,  and no oropharyngeal exudates.  There are no signs of meningismus.  I see no signs of an acute bacterial infection.  The patient's presentation is most consistent with a viral upper respiratory infection.  I have a low suspicion for pneumonia as the patient's cough has been non-productive and the patient is neither tachypneic nor hypoxic on room air.  Additionally, the patient is CTAB.  Chest x-ray was performed given the duration of illness which revealed no evidence of focal consolidation to suggest pneumonia.  COVID-19 and influenza PCR testing was collected and resulted negative.  Feel that symptoms are most consistent with persistent viral URI.  Offered the patient Tessalon pearls for cough but the patient declined.  I discussed symptomatic management, including hydration, motrin, and tylenol. The patient felt safe being discharged from the ED.  They agreed to followup with the PCP if needed.  I provided ED return precautions.  On further chart review, the patient had been endorsing chest discomfort.  I called the patient to check up on her following discharge and she did state that she was having some chest discomfort associated with her cough and shortness of breath.  This does raise some concern and  suspicion for possible pulmonary embolism although patient was not tachycardic or tachypneic and saturating well on room air.  I discussed return to the emergency department as an option to evaluate for possible blood clot in her lungs via CTA PE imaging.  The patient endorsed understanding and will present back to the emergency department for further evaluation to include screening labs and CTA PE imaging.    Final Clinical Impression(s) / ED Diagnoses Final diagnoses:  Viral illness  Acute cough  Shortness of breath    Rx / DC Orders ED Discharge Orders     None        Regan Lemming, MD 10/21/21 1735

## 2021-10-21 NOTE — Discharge Instructions (Addendum)
You were evaluated in the Emergency Department and after careful evaluation, we did not find any emergent condition requiring admission or further testing in the hospital.  Your exam/testing today was overall reassuring.  Your COVID-19 and influenza testing resulted negative.  Your chest x-ray did not show evidence of a bacterial pneumonia.  Recommend continued Tylenol and ibuprofen and continued oral hydration.  Please return to the Emergency Department if you experience any worsening of your condition.  Thank you for allowing [place] to be a part of your care.

## 2021-10-21 NOTE — ED Triage Notes (Signed)
Pt was seen here earlier today with cough, body aches, sore throat. Pt was discharged, but then received a phone call asking her to return for a chest CT.

## 2021-10-21 NOTE — ED Triage Notes (Signed)
Pt c/o sore throat, cough, body aches. Pt denies worsening, but states she is not getting any body. Pt states the coughing is the worse part. Pt was seen for same on 12/13. Pt had negative flu, COVID, and strep tests at the time.

## 2021-10-21 NOTE — ED Provider Notes (Signed)
[place] EMERGENCY DEPT Provider Note   CSN: [id number] Arrival date & time: 10/21/21  1803     History Chief Complaint  Patient presents with   Follow-up    [name] is a 55 y.o. female with a past medical history of GERD, asthma and hypertension presenting by the request of previous ED provider to rule out a PE.  Patient was seen this morning as well as 4 days ago for URI symptoms.  On 12/14 she was worked up for ACS with no concerning findings.  Today her flu and COVID tests were negative.  She was discharged home but later contacted by the ED provider to clarify whether or not she had chest discomfort at this time.  She reported that she was having discomfort since discharge and he instructed her to return to the emergency department to rule out any PE.  No history of DVT/PE.  Past Medical History:  Diagnosis Date   Allergic reaction caused by a drug 01/25/2013   probably.    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     Family History  Problem  Relation Age of Onset   Hypertension Mother    Alzheimer's disease Mother    Hypertension Father    Prostate cancer Father    HIV Brother    Lupus Sister    Colon cancer Paternal Grandmother    Esophageal cancer Neg Hx    Liver cancer Neg Hx    Pancreatic cancer Neg Hx    Rectal cancer Neg Hx    Stomach cancer Neg Hx     Social History   Tobacco Use   Smoking status: Former    Packs/day: 0.50    Years: 17.00    Pack years: 8.50    Types: Cigarettes    Quit date: 12/12/2010    Years since quitting: 10.8   Smokeless tobacco: Never  Vaping Use   Vaping Use: Never used  Substance Use Topics   Alcohol use: No   Drug use: No    Home Medications Prior to Admission medications   Medication Sig Start Date End Date Taking? Authorizing Provider  albuterol (PROVENTIL) (2.5 MG/3ML) 0.083% nebulizer solution Inhale 3 mLs (2.5 mg total) by nebulization every 6 (six) hours as  needed for wheezing or shortness of breath. 06/01/21   [name], MD  albuterol (VENTOLIN HFA) 108 (90 Base) MCG/ACT inhaler Inhale 2 puffs into the lungs every 4 (four) hours as needed. 07/17/21   [name], MD  cyclobenzaprine (FLEXERIL) 10 MG tablet Take 1 tablet by mouth at bedtime as needed. 09/05/21   Meredith Pel, MD  diazepam (VALIUM) 5 MG tablet Take one tablet by mouth with food one hour prior to procedure. May repeat 30 minutes prior if needed. 10/09/21   Lorine Bears, NP  EPINEPHRINE 0.3 mg/0.3 mL IJ SOAJ injection INJECT 1 SYRINGE INTO THE MUSCLE AS NEEDED FOR ANAPHYLAXIS Patient taking differently: Inject 0.3 mg into the muscle as needed for anaphylaxis. 08/19/19   [name], [name], MD  famotidine (PEPCID) 20 MG tablet One at bedtime Patient taking differently: Take 20 mg by mouth at bedtime. One at bedtime 11/11/18   [name], MD  fluticasone [facility]) 50 MCG/ACT nasal spray USE 2 SPRAYS IN [facility] NOSTRIL DAILY 04/13/21   [name], [name], MD  hydrochlorothiazide (HYDRODIURIL) 25 MG  tablet Take 1 tablet (25 mg total) by mouth daily. 07/18/21   [name], [name], MD  ibuprofen (ADVIL) 200 MG tablet Take 400 mg by mouth every 6 (six) hours as needed for moderate pain.    [provider]  methocarbamol (ROBAXIN) 500 MG tablet Take 1 tablet (500 mg total) by mouth every 8 (eight) hours as needed for muscle spasms. 09/08/20   [name], MD  mometasone-formoterol [facility]) 100-5 MCG/ACT AERO Take 2 puffs first thing in  the morning and then another 2 puffs about 12 hours later. 07/17/21   [name], MD  ofloxacin (FLOXIN) 0.3 % OTIC solution Place 10 drops into the right ear daily. 01/09/21   [name], [name], MD  ondansetron (ZOFRAN ODT) 4 MG disintegrating tablet Take 1 tablet (4 mg total) by mouth every 8 (eight) hours as needed for nausea or vomiting. 03/27/21   Scot Jun, FNP  pantoprazole (PROTONIX) 40 MG tablet TAKE ONE TABLET BY MOUTH TWICE DAILY BEFORE MEALS. TAKE 30 TO 60 MINUTES BEFORE FIRST MEAL OF THE DAY 06/26/20   [name], MD  predniSONE (DELTASONE) 10 MG tablet Take 2 tablets by mouth daily. 10/17/21   Sherrill Raring, PA-C  traMADol (ULTRAM) 50 MG tablet Take 1 tablet by mouth every 8 hours as needed for severe pain. 07/28/21   Gregor Hams, MD  valACYclovir (VALTREX) 1000 MG tablet Take 1 tablet (1,000 mg total) by mouth 3 (three) times daily. For shingles 01/09/21   [name], [name], MD  valsartan (DIOVAN) 320 MG tablet Take 0.5 tablets (160 mg total) by mouth daily. 07/18/21   [name], [name], MD  VITAMIN D PO Take 1 capsule by mouth daily.    [provider]    Allergies    Allegra [fexofenadine]; Cimetidine; Diovan hct [valsartan-hydrochlorothiazide]; Purified water [water, sterile]; Chocolate; Citric acid; Coly-mycin s; Peanut-containing drug products; Valsartan; and Amlodipine  Review of Systems   Review of Systems  Constitutional:  Negative for chills and fever.  Respiratory:  Positive for cough and chest tightness.    Cardiovascular:  Negative for leg swelling.  All other systems reviewed and are negative.  Physical Exam Updated Vital Signs BP (!) 130/92    Pulse 70    Temp 98.1 F (36.7 C) (Oral)    Resp 20    SpO2 96%   Physical Exam Vitals and  nursing note reviewed.  Constitutional:      Appearance: Normal appearance.  HENT:     Head: Normocephalic and atraumatic.  Eyes:     General: No scleral icterus.    Conjunctiva/sclera: Conjunctivae normal.  Cardiovascular:     Rate and Rhythm: Normal rate and regular rhythm.  Pulmonary:     Effort: Pulmonary effort is normal. No respiratory distress.     Breath sounds: No wheezing or rales.  Skin:    Findings: No rash.  Neurological:     Mental Status: She is alert.  Psychiatric:        Mood and Affect: Mood normal.    ED Results / Procedures / Treatments   Labs (all labs ordered are listed, but only abnormal results are displayed) Labs Reviewed  CBC WITH DIFFERENTIAL/PLATELET - Abnormal; Notable for the following components:      Result Value   Hemoglobin 11.9 (*)    All other components within normal limits  BASIC METABOLIC PANEL    EKG None  Radiology CT Angio Chest PE W and/or Wo Contrast  Result Date: 10/21/2021 CLINICAL DATA:  Pulmonary embolism (PE) suspected, high prob EXAM: CT ANGIOGRAPHY CHEST WITH CONTRAST TECHNIQUE: Multidetector CT imaging of the chest was performed using the standard protocol during bolus administration of intravenous contrast. Multiplanar CT image reconstructions and MIPs were obtained to evaluate the vascular anatomy. CONTRAST:  181mL OMNIPAQUE IOHEXOL 350 MG/ML SOLN COMPARISON:  12/11/2019 FINDINGS: Cardiovascular: Adequate opacification of the pulmonary arterial tree. No intraluminal filling defect identified to suggest acute pulmonary embolism. Central pulmonary arteries are of normal caliber. No significant coronary artery calcification. Cardiac size within normal limits. No pericardial effusion. No  significant atherosclerotic calcification within the thoracic aorta. No aortic aneurysm. Mediastinum/Nodes: No enlarged mediastinal, hilar, or axillary lymph nodes. Thyroid gland, trachea, and esophagus demonstrate no significant findings. Lungs/Pleura: Mild bibasilar atelectasis. No superimposed confluent pulmonary infiltrate. No pneumothorax or pleural effusion. Central airways are widely patent. Upper Abdomen: No acute abnormality. Musculoskeletal: No chest wall abnormality. No acute or significant osseous findings. Review of the MIP images confirms the above findings. IMPRESSION: No pulmonary embolism.  No acute intrathoracic pathology identified. Electronically Signed   By: Fidela Salisbury M.D.   On: 10/21/2021 20:47   DG Chest Portable 1 View  Result Date: 10/21/2021 CLINICAL DATA:  Cough. EXAM: PORTABLE CHEST 1 VIEW COMPARISON:  October 17, 2021 FINDINGS: The thoracic aorta is more prominent the interval, likely due to portable technique given the short interval follow-up. Cardiomediastinal silhouette is otherwise unchanged. No pneumothorax. Scar atelectasis in the left base. No focal infiltrate. No suspicious nodule or mass. IMPRESSION: 1. Increased prominence of the aorta compared to October 17, 2021 is likely due to the portable technique given the short interval follow-up and the history of cough. AP and lateral chest x-ray could better evaluate if clinically warranted. 2. No focal infiltrate. Electronically Signed   By: Dorise Bullion III M.D.   On: 10/21/2021 14:48    Procedures Procedures   Medications Ordered in ED Medications  iohexol (OMNIPAQUE) 350 MG/ML injection 100 mL (100 mLs Intravenous Contrast Given 10/21/21 1951)    ED Course  I have reviewed the triage vital signs and the nursing notes.  Pertinent labs & imaging results that were available during my care of the patient were reviewed by me and considered in my medical decision making (see chart for details).    MDM  Rules/Calculators/A&P 55 year old presenting to the emergency department to rule out pulmonary embolus.  She reported that her symptoms have not changed since her visit earlier today however she continues to have discomfort and pain with inspiration.  She denied any history of DVT/PE.  Due to her 2 recent negative work-ups for URI as well as ACS, I believe it is reasonable to investigate further and rule out a patient is agreeable to this plan.  She has not had lab work today either so I have ordered basic labs.  Workup: Lab work unremarkable.  CT scan negative.  Stable for discharge at this time.  We discussed return precautions as well as proper follow-up with primary care provider.  Final Clinical Impression(s) / ED Diagnoses Final diagnoses:  Viral upper respiratory tract infection    Rx / DC Orders Results and diagnoses were explained to the patient. Return precautions discussed in full. Patient had no additional questions and expressed complete understanding.     Darliss Ridgel 10/21/21 2101    Truddie Hidden, MD 10/21/21 2256

## 2021-10-24 ENCOUNTER — Ambulatory Visit: Payer: BC Managed Care – PPO | Admitting: Orthopedic Surgery

## 2021-10-26 ENCOUNTER — Ambulatory Visit: Payer: BC Managed Care – PPO | Admitting: Physical Medicine and Rehabilitation

## 2021-10-31 ENCOUNTER — Ambulatory Visit (INDEPENDENT_AMBULATORY_CARE_PROVIDER_SITE_OTHER): Payer: BC Managed Care – PPO | Admitting: Internal Medicine

## 2021-10-31 ENCOUNTER — Encounter: Payer: Self-pay | Admitting: Internal Medicine

## 2021-10-31 VITALS — BP 150/90 | HR 74 | Temp 98.6°F | Ht 60.0 in | Wt 148.4 lb

## 2021-10-31 DIAGNOSIS — Z79899 Other long term (current) drug therapy: Secondary | ICD-10-CM

## 2021-10-31 DIAGNOSIS — R61 Generalized hyperhidrosis: Secondary | ICD-10-CM

## 2021-10-31 DIAGNOSIS — E876 Hypokalemia: Secondary | ICD-10-CM

## 2021-10-31 DIAGNOSIS — R768 Other specified abnormal immunological findings in serum: Secondary | ICD-10-CM

## 2021-10-31 DIAGNOSIS — J45901 Unspecified asthma with (acute) exacerbation: Secondary | ICD-10-CM

## 2021-10-31 DIAGNOSIS — I10 Essential (primary) hypertension: Secondary | ICD-10-CM

## 2021-10-31 LAB — CBC WITH DIFFERENTIAL/PLATELET
Basophils Absolute: 0 10*3/uL (ref 0.0–0.1)
Basophils Relative: 0.4 % (ref 0.0–3.0)
Eosinophils Absolute: 0.4 10*3/uL (ref 0.0–0.7)
Eosinophils Relative: 6.9 % — ABNORMAL HIGH (ref 0.0–5.0)
HCT: 39.3 % (ref 36.0–46.0)
Hemoglobin: 12.9 g/dL (ref 12.0–15.0)
Lymphocytes Relative: 42.1 % (ref 12.0–46.0)
Lymphs Abs: 2.3 10*3/uL (ref 0.7–4.0)
MCHC: 32.8 g/dL (ref 30.0–36.0)
MCV: 85.1 fl (ref 78.0–100.0)
Monocytes Absolute: 0.4 10*3/uL (ref 0.1–1.0)
Monocytes Relative: 6.9 % (ref 3.0–12.0)
Neutro Abs: 2.4 10*3/uL (ref 1.4–7.7)
Neutrophils Relative %: 43.7 % (ref 43.0–77.0)
Platelets: 338 10*3/uL (ref 150.0–400.0)
RBC: 4.62 Mil/uL (ref 3.87–5.11)
RDW: 15.6 % — ABNORMAL HIGH (ref 11.5–15.5)
WBC: 5.4 10*3/uL (ref 4.0–10.5)

## 2021-10-31 LAB — SEDIMENTATION RATE: Sed Rate: 12 mm/hr (ref 0–30)

## 2021-10-31 LAB — MAGNESIUM: Magnesium: 2 mg/dL (ref 1.5–2.5)

## 2021-10-31 LAB — C-REACTIVE PROTEIN: CRP: <1 mg/dL (ref 0.5–20.0)

## 2021-10-31 LAB — FOLLICLE STIMULATING HORMONE: FSH: 126.9 m[IU]/mL

## 2021-10-31 LAB — BASIC METABOLIC PANEL
BUN: 16 mg/dL (ref 6–23)
CO2: 30 mEq/L (ref 19–32)
Calcium: 9.6 mg/dL (ref 8.4–10.5)
Chloride: 100 mEq/L (ref 96–112)
Creatinine, Ser: 0.96 mg/dL (ref 0.40–1.20)
GFR: 66.94 mL/min (ref >60.00)
Glucose, Bld: 93 mg/dL (ref 70–99)
Potassium: 3.7 mEq/L (ref 3.5–5.1)
Sodium: 138 mEq/L (ref 135–145)

## 2021-10-31 MED ORDER — PREDNISONE 20 MG PO TABS: 20.0000 mg | ORAL_TABLET | Freq: Two times a day (BID) | ORAL | 0 refills | Status: DC

## 2021-10-31 NOTE — Patient Instructions (Addendum)
This sounds like a viral triggered    asthmatic  flare   Checking the low potassium follow up. Will review the record   if the sweats ongoing would  consider doing more evaluation.   The chest ct showed no  concerning  findings .  If needed we can add back some prednisone  but call if feeling need for breathing .  Plan fu depending

## 2021-10-31 NOTE — Progress Notes (Signed)
Chief Complaint  Patient presents with   Night Sweats    And hospital follow up    HPI: [name] 55 y.o. come in for  fu sx that took her to ED.      Had voice  prickiny and on dec 13 and then "had no strength"  coughing  a lot .  Almost like covid  and had negative covid tests.  Went to ED directed  and went to  [facility] after  waiting 6 hours in ed and had albuterol with help .  Was given also some prednisone had a CT angio to rule out blood clot.  Has been out  of work  for a few more days   out to Dec 21 and still was hard to get back  .  Feels that she is a lot better and using her albuterol as needed however just today Breathing  chest tight  from coming in side the office building felt tight but no acute.  Had  been getting better.  ROS: See pertinent positives and negatives per HPI.  Reports a month or so of drenching night sweats without other associated symptoms and this happen before this reaction.  No other associated symptoms weight loss.  Blood pressure has been good at home.  Past Medical History:  Diagnosis Date   Allergic reaction caused by a drug 01/25/2013   probably.    Allergy    Anemia    Asthma    GERD (gastroesophageal reflux disease)    Heart murmur    Hypertension    Positive TB test    Shingles    TOBACCO USE 03/22/2009   Qualifier: Diagnosis of  By: Regis Bill MD, [name]  Stopped Dec 12 2010      Family History  Problem Relation Age of Onset   Hypertension Mother    Alzheimer's disease Mother    Hypertension Father    Prostate cancer Father    HIV Brother    Lupus Sister    Colon cancer Paternal Grandmother    Esophageal cancer Neg Hx    Liver cancer Neg Hx    Pancreatic cancer Neg Hx    Rectal cancer Neg Hx    Stomach cancer Neg Hx     Social History   Socioeconomic History   Marital status: Divorced    Spouse name: Not on file   Number of children: Not on file   Years of education: Not on file   Highest education level: Not on  file  Occupational History   Not on file  Tobacco Use   Smoking status: Former    Packs/day: 0.50    Years: 17.00    Pack years: 8.50    Types: Cigarettes    Quit date: 12/12/2010    Years since quitting: 10.8   Smokeless tobacco: Never  Vaping Use   Vaping Use: Never used  Substance and Sexual Activity   Alcohol use: No   Drug use: No   Sexual activity: Not on file  Other Topics Concern   Not on file  Social History Narrative   Divorced    Tobacco since age 74 stopped feb 2012   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 125 mL 12   albuterol (VENTOLIN HFA) 108 (90 Base) MCG/ACT inhaler Inhale 2 puffs into the lungs every 4 (four) hours as needed. 24 g 3   cyclobenzaprine (FLEXERIL) 10 MG tablet Take 1 tablet by mouth at bedtime as needed. 30 tablet 0   diazepam (VALIUM) 5 MG tablet Take one tablet by mouth with food one hour prior to procedure. May repeat 30 minutes prior if needed. 2 tablet 0   EPINEPHRINE 0.3 mg/0.3 mL IJ SOAJ injection INJECT 1 SYRINGE INTO THE MUSCLE AS NEEDED FOR ANAPHYLAXIS (Patient taking differently: Inject 0.3 mg into the muscle as needed for anaphylaxis.) 2 each 1   famotidine (PEPCID) 20 MG tablet One at bedtime (Patient taking differently: Take 20 mg by mouth at bedtime. One at bedtime) 60 tablet 11   fluticasone (FLONASE) 50 MCG/ACT nasal spray USE 2 SPRAYS IN EACH NOSTRIL DAILY 48 g 2   hydrochlorothiazide (HYDRODIURIL) 25 MG tablet  Take 1 tablet (25 mg total) by mouth daily. 30 tablet 4   ibuprofen (ADVIL) 200 MG tablet Take 400 mg by mouth every 6 (six) hours as needed for moderate pain.     methocarbamol (ROBAXIN) 500 MG tablet Take 1 tablet (500 mg total) by mouth every 8 (eight) hours as needed for muscle spasms. 10 tablet 0   pantoprazole (PROTONIX) 40 MG tablet TAKE ONE TABLET BY MOUTH TWICE DAILY BEFORE MEALS. TAKE 30 TO 60 MINUTES BEFORE FIRST MEAL OF THE DAY 60 tablet 0   traMADol (ULTRAM) 50 MG tablet Take 1 tablet by mouth every 8 hours as needed for severe pain. 10 tablet 0   valsartan (DIOVAN) 320 MG tablet Take 0.5 tablets (160 mg total) by mouth daily. 30 tablet 2   VITAMIN D PO Take 1 capsule by mouth daily.     predniSONE (DELTASONE) 10 MG tablet Take 2 tablets by mouth daily. 20 tablet 0   mometasone-formoterol (DULERA) 100-5 MCG/ACT AERO Take 2 puffs first thing in  the morning and then another 2 puffs about 12 hours later. (Patient not taking: Reported on 10/31/2021) 39 g 3   ofloxacin (FLOXIN) 0.3 % OTIC solution Place 10 drops into the right ear daily. (Patient not taking: Reported on 10/31/2021) 10 mL 0   ondansetron (ZOFRAN ODT) 4 MG disintegrating tablet Take 1 tablet (4 mg total) by mouth every 8 (eight) hours as needed for nausea or vomiting. (Patient not taking: Reported on 10/31/2021) 20 tablet 0   valACYclovir (VALTREX) 1000 MG tablet Take 1 tablet (1,000 mg total) by mouth 3 (three) times daily. For shingles (Patient not taking: Reported on 10/31/2021) 21 tablet 0   No facility-administered medications prior to visit.     EXAM:  BP (!) 150/90 (BP Location: Left Arm)    Pulse 74    Temp 98.6 F (37 C) (Oral)    Ht 5' (1.524 m)    Wt 148 lb 6.4 oz (67.3 kg)    SpO2 98%    BMI 28.98 kg/m   Body mass index is 28.98 kg/m.  GENERAL: vitals reviewed and listed above, alert, oriented, appears well hydrated and in no acute distress HEENT: atraumatic, conjunctiva  clear, no obvious abnormalities  on  inspection of external nose and ears OP : no lesion edema or exudate  NECK: no obvious masses on inspection palpation  LUNGS: clear to auscultation bilaterally, no wheezes, rales or rhonchi, good air movement CV: HRRR, no clubbing cyanosis or  peripheral edema nl cap refill  MS: moves all extremities without noticeable focal  abnormality PSYCH: pleasant and cooperative, no obvious depression or anxiety Lab Results  Component Value Date   WBC 5.4 10/31/2021   HGB 12.9 10/31/2021   HCT 39.3 10/31/2021   PLT 338.0 10/31/2021   GLUCOSE 93 10/31/2021   CHOL 195 05/19/2018   TRIG 41.0 05/19/2018   HDL 83.80 05/19/2018   LDLCALC 103 (H) 05/19/2018   ALT 9 05/18/2021   AST 15 05/18/2021   NA 138 10/31/2021   K 3.7 10/31/2021   CL 100 10/31/2021   CREATININE 0.96 10/31/2021   BUN 16 10/31/2021   CO2 30 10/31/2021   TSH 0.97 05/18/2021   INR 1.0 01/17/2021   MICROALBUR 0.7 05/30/2009   BP Readings from Last 3 Encounters:  10/31/21 (!) 150/90  10/21/21 127/82  10/21/21 111/78    ASSESSMENT AND PLAN:  Discussed the following assessment and plan:  Exacerbation of asthma, unspecified asthma severity, unspecified whether persistent - Plan: Basic metabolic panel, Magnesium, CBC with Differential/Platelet, C-reactive protein, Sedimentation rate, Follicle Stimulating Hormone, Basic metabolic panel, Magnesium, CBC with Differential/Platelet, C-reactive protein, Sedimentation rate, Follicle Stimulating Hormone  Hypokalemia - Plan: Basic metabolic panel, Magnesium, CBC with Differential/Platelet, C-reactive protein, Sedimentation rate, Follicle Stimulating Hormone, Basic metabolic panel, Magnesium, CBC with Differential/Platelet, C-reactive protein, Sedimentation rate, Follicle Stimulating Hormone  Night sweats - Plan: Basic metabolic panel, Magnesium, CBC with Differential/Platelet, C-reactive protein, Sedimentation rate, Follicle Stimulating Hormone, Basic metabolic panel, Magnesium, CBC  with Differential/Platelet, C-reactive protein, Sedimentation rate, Follicle Stimulating Hormone  ANA positive  Essential hypertension  Medication management  -Patient advised to return or notify health care team  if  new concerns arise. Record review from ED labs show some potassium of 3.2 would follow that up based on her symptom complex prescription for a prednisone burst to give only if needed with relapsing symptoms. She will stay on her controller inhalers and as needed monitor blood pressure to make sure indeed is in range.  She did have her second COVID infection June 2 perhaps makes her more susceptible so some other symptoms but her chest discomfort was most likely pulmonary in cause.  No evidence of serious finding on her chest CT if sweats continue and progress consider other evaluation she has had a rheumatologic work-up in past for positive ANA  high  titer  cytoplasmic reticular  but ena ana panel neg   Has a history of a positive PPD but chest CT is unremarkable.  Uncertain why she is having the having night sweats.  To follow. Patient Instructions  This sounds like a viral triggered    asthmatic  flare   Checking the low potassium follow up. Will review the record   if the sweats ongoing would  consider doing more evaluation.   The chest ct showed no  concerning  findings .  If needed we can add back some prednisone  but call if feeling need for breathing .  Plan fu depending   [name]. Rashidah Belleville M.D.

## 2021-11-06 ENCOUNTER — Encounter (HOSPITAL_BASED_OUTPATIENT_CLINIC_OR_DEPARTMENT_OTHER): Payer: Self-pay | Admitting: Emergency Medicine

## 2021-11-06 ENCOUNTER — Emergency Department (HOSPITAL_BASED_OUTPATIENT_CLINIC_OR_DEPARTMENT_OTHER): Payer: BC Managed Care – PPO

## 2021-11-06 ENCOUNTER — Emergency Department (HOSPITAL_BASED_OUTPATIENT_CLINIC_OR_DEPARTMENT_OTHER)
Admission: EM | Admit: 2021-11-06 | Discharge: 2021-11-06 | Disposition: A | Discharge status: Home / Self Care | Payer: BC Managed Care – PPO | Attending: Emergency Medicine | Admitting: Emergency Medicine

## 2021-11-06 ENCOUNTER — Other Ambulatory Visit: Payer: Self-pay

## 2021-11-06 DIAGNOSIS — R072 Precordial pain: Secondary | ICD-10-CM | POA: Diagnosis not present

## 2021-11-06 DIAGNOSIS — R0602 Shortness of breath: Secondary | ICD-10-CM | POA: Diagnosis not present

## 2021-11-06 DIAGNOSIS — Z20822 Contact with and (suspected) exposure to covid-19: Secondary | ICD-10-CM | POA: Diagnosis not present

## 2021-11-06 DIAGNOSIS — I10 Essential (primary) hypertension: Secondary | ICD-10-CM | POA: Insufficient documentation

## 2021-11-06 DIAGNOSIS — Z91010 Allergy to peanuts: Secondary | ICD-10-CM | POA: Diagnosis not present

## 2021-11-06 DIAGNOSIS — Z7951 Long term (current) use of inhaled steroids: Secondary | ICD-10-CM | POA: Diagnosis not present

## 2021-11-06 DIAGNOSIS — Z79899 Other long term (current) drug therapy: Secondary | ICD-10-CM | POA: Insufficient documentation

## 2021-11-06 DIAGNOSIS — R0600 Dyspnea, unspecified: Secondary | ICD-10-CM | POA: Diagnosis not present

## 2021-11-06 LAB — CBC
HCT: 39.9 % (ref 36.0–46.0)
Hemoglobin: 12.8 g/dL (ref 12.0–15.0)
MCH: 27.4 pg (ref 26.0–34.0)
MCHC: 32.1 g/dL (ref 30.0–36.0)
MCV: 85.4 fL (ref 80.0–100.0)
Platelets: 397 10*3/uL (ref 150–400)
RBC: 4.67 MIL/uL (ref 3.87–5.11)
RDW: 15.4 % (ref 11.5–15.5)
WBC: 5.9 10*3/uL (ref 4.0–10.5)
nRBC: 0 % (ref 0.0–0.2)

## 2021-11-06 LAB — RESP PANEL BY RT-PCR (FLU A&B, COVID) ARPGX2
Influenza A by PCR: NEGATIVE
Influenza B by PCR: NEGATIVE
SARS Coronavirus 2 by RT PCR: NEGATIVE

## 2021-11-06 LAB — TROPONIN I (HIGH SENSITIVITY)
Troponin I (High Sensitivity): <2 ng/L (ref <18)
Troponin I (High Sensitivity): <2 ng/L (ref <18)

## 2021-11-06 LAB — BASIC METABOLIC PANEL
Anion gap: 8 (ref 5–15)
BUN: 16 mg/dL (ref 6–20)
CO2: 28 mmol/L (ref 22–32)
Calcium: 9.6 mg/dL (ref 8.9–10.3)
Chloride: 103 mmol/L (ref 98–111)
Creatinine, Ser: 0.95 mg/dL (ref 0.44–1.00)
GFR, Estimated: >60 mL/min (ref >60)
Glucose, Bld: 107 mg/dL — ABNORMAL HIGH (ref 70–99)
Potassium: 3.2 mmol/L — ABNORMAL LOW (ref 3.5–5.1)
Sodium: 139 mmol/L (ref 135–145)

## 2021-11-06 MED ORDER — ASPIRIN 81 MG PO CHEW
324.0000 mg | CHEWABLE_TABLET | Freq: Once | ORAL | Status: DC
  Filled 2021-11-06: qty 4

## 2021-11-06 MED ORDER — ALBUTEROL SULFATE HFA 108 (90 BASE) MCG/ACT IN AERS
2.0000 | INHALATION_SPRAY | RESPIRATORY_TRACT | Status: DC | PRN
  Administered 2021-11-06: 2 via RESPIRATORY_TRACT
  Filled 2021-11-06: qty 6.7

## 2021-11-06 MED ORDER — ASPIRIN 325 MG PO TABS
325.0000 mg | ORAL_TABLET | Freq: Every day | ORAL | Status: DC
  Administered 2021-11-06: 325 mg via ORAL
  Filled 2021-11-06: qty 1

## 2021-11-06 MED ORDER — NITROGLYCERIN 0.4 MG SL SUBL
0.4000 mg | SUBLINGUAL_TABLET | SUBLINGUAL | Status: AC | PRN
  Administered 2021-11-06 (×3): 0.4 mg via SUBLINGUAL
  Filled 2021-11-06: qty 1

## 2021-11-06 MED ORDER — ACETAMINOPHEN 325 MG PO TABS
650.0000 mg | ORAL_TABLET | Freq: Once | ORAL | Status: AC
Start: 2021-11-06 — End: 2021-11-06
  Administered 2021-11-06: 650 mg via ORAL
  Filled 2021-11-06: qty 2

## 2021-11-06 NOTE — Discharge Instructions (Signed)

## 2021-11-06 NOTE — ED Provider Notes (Signed)
[facility] DEPT Provider Note   CSN: [id number] Arrival date & time: 11/06/21  0156     History  Chief Complaint  Patient presents with   Shortness of Breath    [name] is a 56 y.o. female.   Shortness of Breath Associated symptoms: chest pain   Associated symptoms: no diaphoresis, no fever and no vomiting    HPI: A 56 year old patient with a history of hypertension presents for evaluation of chest pain. Initial onset of pain was approximately 1-3 hours ago. The patient's chest pain is described as heaviness/pressure/tightness and is not worse with exertion. The patient's chest pain is middle- or left-sided, is not well-localized, is not sharp and does not radiate to the arms/jaw/neck. The patient does not complain of nausea and denies diaphoresis. The patient has no history of stroke, has no history of peripheral artery disease, has not smoked in the past 90 days, denies any history of treated diabetes, has no relevant family history of coronary artery disease (first degree relative at less than age 33), has no history of hypercholesterolemia and does not have an elevated BMI (>=30).  Patient presents with chest pain and shortness of breath.  Patient presents for her fifth ER visit in 6 months.  Patient reports she has been off work for several weeks due to a viral illness.  She just returned to work last night and after 45 minutes of standing around she started having chest pain and shortness of breath.  She reports it feels like something is sitting on her chest No fevers or vomiting.  No significant pleuritic pain.  No diaphoresis.  She does report dyspnea on exertion No previous history of CAD/VTE Home Medications Prior to Admission medications   Medication Sig Start Date End Date Taking? Authorizing Provider  albuterol (PROVENTIL) (2.5 MG/3ML) 0.083% nebulizer solution Inhale 3 mLs (2.5 mg total) by nebulization every 6 (six) hours as needed for  wheezing or shortness of breath. 06/01/21   [name], MD  albuterol (VENTOLIN HFA) 108 (90 Base) MCG/ACT inhaler Inhale 2 puffs into the lungs every 4 (four) hours as needed. 07/17/21   [name], MD  cyclobenzaprine (FLEXERIL) 10 MG tablet Take 1 tablet by mouth at bedtime as needed. 09/05/21   Meredith Pel, MD  diazepam (VALIUM) 5 MG tablet Take one tablet by mouth with food one hour prior to procedure. May repeat 30 minutes prior if needed. 10/09/21   Lorine Bears, NP  EPINEPHRINE 0.3 mg/0.3 mL IJ SOAJ injection INJECT 1 SYRINGE INTO THE MUSCLE AS NEEDED FOR ANAPHYLAXIS Patient taking differently: Inject 0.3 mg into the muscle as needed for anaphylaxis. 08/19/19   [name], [name], MD  famotidine (PEPCID) 20 MG tablet One at bedtime Patient taking differently: Take 20 mg by mouth at bedtime. One at bedtime 11/11/18   [name], MD  fluticasone [facility]) 50 MCG/ACT nasal spray USE 2 SPRAYS IN [facility] NOSTRIL DAILY 04/13/21   [name], [name], MD  hydrochlorothiazide (HYDRODIURIL) 25 MG tablet Take 1 tablet (25 mg total) by mouth daily. 07/18/21   [name], [name], MD  ibuprofen (ADVIL) 200 MG tablet Take 400 mg by mouth every 6 (six) hours as needed for moderate pain.    [provider]  methocarbamol (ROBAXIN) 500 MG tablet Take 1 tablet (500 mg total) by mouth every 8 (eight) hours as needed for muscle spasms. 09/08/20   [name], MD  mometasone-formoterol [facility]) 100-5 MCG/ACT AERO Take 2  puffs first thing in  the morning and then another 2 puffs about 12 hours later. Patient not taking: Reported on 10/31/2021 07/17/21   [name], MD  ofloxacin (FLOXIN) 0.3 % OTIC solution Place 10 drops into the right ear daily. Patient not taking: Reported on 10/31/2021 01/09/21   [name], [name], MD  ondansetron (ZOFRAN ODT) 4 MG disintegrating tablet Take 1 tablet (4 mg total) by mouth every 8 (eight) hours as needed for nausea or vomiting. Patient not taking: Reported  on 10/31/2021 03/27/21   Scot Jun, FNP  pantoprazole (PROTONIX) 40 MG tablet TAKE ONE TABLET BY MOUTH TWICE DAILY BEFORE MEALS. TAKE 30 TO 60 MINUTES BEFORE FIRST MEAL OF THE DAY 06/26/20   [name], MD  predniSONE (DELTASONE) 20 MG tablet Take 1 tablet (20 mg total) by mouth 2 (two) times daily with a meal. If needed for asthma flare 10/31/21   [name], [name], MD  traMADol (ULTRAM) 50 MG tablet Take 1 tablet by mouth every 8 hours as needed for severe pain. 07/28/21   Gregor Hams, MD  valACYclovir (VALTREX) 1000 MG tablet Take 1 tablet (1,000 mg total) by mouth 3 (three) times daily. For shingles Patient not taking: Reported on 10/31/2021 01/09/21   Burnis Medin, MD  valsartan (DIOVAN) 320 MG tablet Take 0.5 tablets (160 mg total) by mouth daily. 07/18/21   [name], [name], MD  VITAMIN D PO Take 1 capsule by mouth daily.    [provider]      Allergies    Allegra [fexofenadine]; Cimetidine; Diovan hct [valsartan-hydrochlorothiazide]; Purified water [water, sterile]; Chocolate; Citric acid; Coly-mycin s; Peanut-containing drug products; Valsartan; and Amlodipine    Review of Systems   Review of Systems  Constitutional:  Negative for diaphoresis and fever.  Respiratory:  Positive for shortness of breath.        Denies hemoptysis  Cardiovascular:  Positive for chest pain. Negative for leg swelling.  Gastrointestinal:  Negative for vomiting.  All other systems reviewed and are negative.  Physical Exam Updated Vital Signs BP (!) 173/108 (BP Location: Left Arm)    Pulse 77    Temp 98.5 F (36.9 C) (Oral)    Resp (!) 28    Ht 1.524 m (5')    Wt 66.7 kg    SpO2 100%    BMI 28.71 kg/m  Physical Exam CONSTITUTIONAL: Well developed/well nourished, patient speaks in a very low tone HEAD: Normocephalic/atraumatic EYES: EOMI/PERRL ENMT: Mucous membranes moist NECK: supple no meningeal signs, no JVD SPINE/BACK:entire spine nontender CV: S1/S2 noted, no  murmurs/rubs/gallops noted LUNGS: Lungs are clear to auscultation bilaterally, no apparent distress ABDOMEN: soft, nontender, no rebound or guarding, bowel sounds noted throughout abdomen GU:no cva tenderness NEURO: Pt is awake/alert/appropriate, moves all extremitiesx4.  No facial droop.   EXTREMITIES: pulses normal/equal, full ROM, no lower extremity edema or tenderness SKIN: warm, color normal PSYCH: no abnormalities of mood noted, alert and oriented to situation  ED Results / Procedures / Treatments   Labs (all labs ordered are listed, but only abnormal results are displayed) Labs Reviewed  BASIC METABOLIC PANEL - Abnormal; Notable for the following components:      Result Value   Potassium 3.2 (*)    Glucose, Bld 107 (*)    All other components within normal limits  RESP PANEL BY RT-PCR (FLU A&B, COVID) ARPGX2  CBC  TROPONIN I (HIGH SENSITIVITY)  TROPONIN I (HIGH SENSITIVITY)    EKG EKG Interpretation  Date/Time:  Tuesday November 06 2021 02:06:57 EST Ventricular Rate:  72 PR Interval:  144 QRS Duration: 78 QT Interval:  414 QTC Calculation: 453 R Axis:   -19 Text Interpretation: Normal sinus rhythm Possible Left atrial enlargement Minimal voltage criteria for LVH, may be normal variant ( R in aVL ) Nonspecific T wave abnormality Abnormal ECG When compared with ECG of 17-Oct-2021 19:43, Nonspecific T wave abnormality, worse in Anterior leads Confirmed by Ripley Fraise [phone]) on 11/06/2021 2:52:26 AM  Radiology DG Chest Portable 1 View  Result Date: 11/06/2021 CLINICAL DATA:  Dyspnea EXAM: PORTABLE CHEST 1 VIEW COMPARISON:  10/21/2021 FINDINGS: Lung volumes are slightly small, but are symmetric and are stable since prior examination. The lungs are clear save for minimal left basilar scarring. No pneumothorax or pleural effusion. Cardiac size within normal limits. The thoracic aorta is tortuous, unchanged. No acute bone abnormality. IMPRESSION: No active disease.  Electronically Signed   By: Fidela Salisbury M.D.   On: 11/06/2021 03:14    Procedures Procedures    Medications Ordered in ED Medications  albuterol (VENTOLIN HFA) 108 (90 Base) MCG/ACT inhaler 2 puff (2 puffs Inhalation Given 11/06/21 0218)  aspirin tablet 325 mg (325 mg Oral Given 11/06/21 0348)  nitroGLYCERIN (NITROSTAT) SL tablet 0.4 mg (0.4 mg Sublingual Given 11/06/21 0414)  acetaminophen (TYLENOL) tablet 650 mg (650 mg Oral Given 11/06/21 0422)    ED Course/ Medical Decision Making/ A&P Clinical Course as of 11/06/21 [place] Nov 06, 2021  0417 Patient with minimal improvement with nitroglycerin [DW]    Clinical Course User Index [DW] Ripley Fraise, MD   HEAR Score: 3                       Medical Decision Making  This patient presents to the ED for concern of chest pain, this involves an extensive number of treatment options, and is a complaint that carries with it a high risk of complications and morbidity.  The differential diagnosis includes acute coronary syndrome, PE, aortic dissection, pneumonia, pericarditis, pneumothorax  Comorbidities that complicate the patient evaluation: Patients presentation is complicated by their history of hypertension   Additional history obtained: Additional history obtained from family Records reviewed  PCP notes reviewed from Dr. Regis Bill  Lab Tests: I Ordered, and personally interpreted labs.  The pertinent results include: No acute findings  Imaging Studies ordered: I ordered imaging studies including X-ray chest   I independently visualized and interpreted imaging which showed no acute findings I agree with the radiologist interpretation  Cardiac Monitoring: The patient was maintained on a cardiac monitor.  I personally viewed and interpreted the cardiac monitor which showed an underlying rhythm of:  sinus rhythm  Medicines ordered and prescription drug management: I ordered medication including nitroglycerin for  pain Reevaluation of the patient after these medicines showed that the patient    stayed the same  Test Considered: Patient is low risk / negative by heart score of 3, therefore do not feel that admission is indicated.   After the interventions noted above, I reevaluated the patient and found that they have :improved  Complexity of problems addressed: Patients presentation is most consistent with  acute presentation with potential threat to life or bodily function  Disposition: After consideration of the diagnostic results and the patients response to treatment,  I feel that the patent would benefit from discharge   .   Patient with recent prolonged viral illnesses now presenting with chest pain  while at work.  This occurred soon after arriving at work and she was just standing around.  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  She had no immediate relief with nitroglycerin, but as she was monitored in the ER her symptoms improved.  Patient was able to ambulate without difficulty.  She had a brief drop in her pulse ox, but otherwise maintained an appropriate pulse oximeter. Her heart score is 3 and both troponins are negative No acute EKG changes At this point, feel patient is appropriate for discharge home. She has been given referral to cardiology. Low suspicion for ACS/PE/dissection or other acute cardiopulmonary emergency at this time. Discussed strict return precautions with patient       Final Clinical Impression(s) / ED Diagnoses Final diagnoses:  Precordial pain    Rx / DC Orders ED Discharge Orders     None         Ripley Fraise, MD 11/06/21 [phone]

## 2021-11-06 NOTE — ED Notes (Signed)
Patient verbalizes understanding of discharge instructions. Opportunity for questioning and answers were provided. Patient discharged from ED.  °

## 2021-11-06 NOTE — ED Triage Notes (Signed)
Pt via pov from home with sob since 1145 pm. She states she is asthmatic and has used her inhaler 3 times with no relief. Pt using accessory muscles to breathe. RT at triage.

## 2021-11-06 NOTE — ED Notes (Signed)
Patient ambulated in hallway with no distress noted. Lowest SpO2 92%. HR 88-94.

## 2021-11-06 NOTE — ED Notes (Signed)
Following administration of albuterol inhaler, pt's daughter came into hallway stating that patient "felt like she was choking". RT to bedside. SpO2 97% on room air. All vitals WDL. No stridor noted upon ascultation. Triage RN called to bedside. Pt placed in treatment room at this time.

## 2021-11-08 ENCOUNTER — Ambulatory Visit: Payer: BC Managed Care – PPO | Admitting: Physical Medicine and Rehabilitation

## 2021-11-11 NOTE — Progress Notes (Signed)
Labs show menopausal status .

## 2021-11-11 NOTE — Progress Notes (Signed)
Basically normal labs

## 2021-11-12 ENCOUNTER — Ambulatory Visit: Payer: BC Managed Care – PPO | Admitting: Physical Medicine and Rehabilitation

## 2021-11-13 ENCOUNTER — Telehealth: Payer: Self-pay | Admitting: Internal Medicine

## 2021-11-13 NOTE — Telephone Encounter (Signed)
Patient returned call from [place], I let her know that she was unavailable right now and would give a call back later. Patient verbalized understanding.    Good callback number is [phone]     Please advise

## 2021-11-14 NOTE — Telephone Encounter (Signed)
Pt notified of result note from 12/28 labs & would like to know what next steps are. Upon review of notes from [place] on 12/28: Will review the record  if the sweats ongoing would  consider doing more evaluation. Advised pt I will send her ask to PCP.  Pt also states she will need FMLA paperwork completed for her 3wk absence from work. States she will complete her portion & drop off paperwork sometime between today & Friday. Pt advised that paperwork should be completed 7-10 business days after receipt. Pt verb understanding.

## 2021-11-15 NOTE — Telephone Encounter (Signed)
Needs fu visit  ok to do virtual  if fmla needed.  Need form and visit    It is possible that  the flushes could still be is related to menopause.   Advise she keep cardiology appt  although  no alarming findings   is reassuring.

## 2021-11-16 ENCOUNTER — Telehealth: Payer: Self-pay

## 2021-11-16 ENCOUNTER — Ambulatory Visit: Payer: BC Managed Care – PPO | Admitting: Orthopedic Surgery

## 2021-11-16 NOTE — Telephone Encounter (Signed)
--  Caller states she got hot at work and started throwing up last night almost 12 hour ago threw up x 2 still feels nauseatd no pain has drunk some water . urinated about 30 min ago  11/16/2021 11:40:52 [place], RN, [place]

## 2021-11-19 ENCOUNTER — Telehealth: Payer: Self-pay | Admitting: Internal Medicine

## 2021-11-19 NOTE — Telephone Encounter (Signed)
Patient brought in paperwork that she needs Dr.[name] to complete. Paperwork would be placed in the folder.  Patient could be contacted at [phone] when paperwork is complete.  Please advise.

## 2021-11-28 ENCOUNTER — Encounter: Payer: Self-pay | Admitting: Cardiology

## 2021-11-28 ENCOUNTER — Other Ambulatory Visit: Payer: Self-pay

## 2021-11-28 ENCOUNTER — Ambulatory Visit (INDEPENDENT_AMBULATORY_CARE_PROVIDER_SITE_OTHER): Payer: BC Managed Care – PPO | Admitting: Cardiology

## 2021-11-28 ENCOUNTER — Telehealth: Payer: Self-pay

## 2021-11-28 DIAGNOSIS — I10 Essential (primary) hypertension: Secondary | ICD-10-CM

## 2021-11-28 DIAGNOSIS — R079 Chest pain, unspecified: Secondary | ICD-10-CM

## 2021-11-28 NOTE — Telephone Encounter (Signed)
Patient is scheduled for virtual visit 1/26

## 2021-11-28 NOTE — Telephone Encounter (Signed)
Spoke with daugther of patient and asked if a copy of FMLA paperwork could be dropped off to the office before appt

## 2021-11-28 NOTE — Assessment & Plan Note (Signed)
Overall well controlled.  No changes made today.  Continue with hydrochlorothiazide 25 mg for medical management.

## 2021-11-28 NOTE — Telephone Encounter (Signed)
Patient is scheduled for virtual visit 11/29/20

## 2021-11-28 NOTE — Progress Notes (Signed)
Cardiology Office Note:    Date:  11/28/2021   ID:  [name], DOB Jun 02, 1966, MRN [id number]  PCP:  Burnis Medin, MD   [facility] Providers Cardiologist:  None     Referring MD: Ripley Fraise, MD    History of Present Illness:    [name] is a 56 y.o. female here for the follow-up of chest pain.  Previously evaluated in 2019.  She was also seen recently in the emergency room as well note reviewed.  Troponins were normal.  Kidney function was normal.  Hemoglobin normal.  She states that she has some continuous discomfort in the left upper chest where she had her shingles.  No rashes currently present.  Overall she is doing well.  Her chest discomfort previously started with what felt like an asthma attack and then felt like pressure someone standing on her.  Anxiety may have been playing a role.  Happened at work.  Past Medical History:  Diagnosis Date   Allergic reaction caused by a drug 01/25/2013   probably.    Allergy    Anemia    Asthma    GERD (gastroesophageal reflux disease)    Heart murmur    Hypertension    Positive TB test    Shingles    TOBACCO USE 03/22/2009   Qualifier: Diagnosis of  By: Regis Bill MD, [name]  Stopped Dec 12 2010      Past Surgical History:  Procedure Laterality Date   ABDOMINAL HYSTERECTOMY     BREAST SURGERY     cyst from left breast   BUNIONECTOMY     both feet   CHOLECYSTECTOMY     CYSTECTOMY     left shoulder   KNEE SURGERY Left    PANENDOSCOPY     TONSILLECTOMY     TUBAL LIGATION     uterine ablastion     and polyps removed   Uterine polyps      Current Medications: Current Meds  Medication Sig   albuterol (PROVENTIL) (2.5 MG/3ML) 0.083% nebulizer solution Inhale 3 mLs (2.5 mg total) by nebulization every 6 (six) hours as needed for wheezing or shortness of breath.   albuterol (VENTOLIN HFA) 108 (90 Base) MCG/ACT inhaler Inhale 2 puffs into the lungs every 4 (four) hours as needed.   cyclobenzaprine  (FLEXERIL) 10 MG tablet Take 1 tablet by mouth at bedtime as needed.   EPINEPHRINE 0.3 mg/0.3 mL IJ SOAJ injection INJECT 1 SYRINGE INTO THE MUSCLE AS NEEDED FOR ANAPHYLAXIS   famotidine (PEPCID) 20 MG tablet One at bedtime (Patient taking differently: One at bedtime)   fluticasone (FLONASE) 50 MCG/ACT nasal spray USE 2 SPRAYS IN EACH NOSTRIL DAILY   hydrochlorothiazide (HYDRODIURIL) 25 MG tablet Take 1 tablet (25 mg total) by mouth daily.   ibuprofen (ADVIL) 200 MG tablet Take 400 mg by mouth every 6 (six) hours as needed for moderate pain.   mometasone-formoterol (DULERA) 100-5 MCG/ACT AERO Take 2 puffs first thing in  the morning and then another 2 puffs about 12 hours later.   pantoprazole (PROTONIX) 40 MG tablet TAKE ONE TABLET BY MOUTH TWICE DAILY BEFORE MEALS. TAKE 30 TO 60 MINUTES BEFORE FIRST MEAL OF THE DAY   traMADol (ULTRAM) 50 MG tablet Take 1 tablet by mouth every 8 hours as needed for severe pain.   valsartan (DIOVAN) 320 MG tablet Take 0.5 tablets (160 mg total) by mouth daily.   VITAMIN D PO Take 1 capsule by mouth daily.  Allergies:   Allegra [fexofenadine]; Cimetidine; Diovan hct [valsartan-hydrochlorothiazide]; Purified water [water, sterile]; Chocolate; Citric acid; Coly-mycin s; Peanut-containing drug products; Valsartan; and Amlodipine   Social History   Socioeconomic History   Marital status: Divorced    Spouse name: Not on file   Number of children: Not on file   Years of education: Not on file   Highest education level: Not on file  Occupational History   Not on file  Tobacco Use   Smoking status: Former    Packs/day: 0.50    Years: 17.00    Pack years: 8.50    Types: Cigarettes    Quit date: 12/12/2010    Years since quitting: 10.9   Smokeless tobacco: Never  Vaping Use   Vaping Use: Never used  Substance and Sexual Activity   Alcohol use: No   Drug use: No   Sexual activity: Not on file  Other Topics Concern   Not on file  Social History  Narrative   Divorced    Tobacco since age 24 stopped feb 2012   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 There is no history of Esophageal cancer, Liver cancer, Pancreatic cancer, Rectal cancer, or Stomach cancer.  ROS:   Please see the history of present illness.     All other systems reviewed and are negative.  EKGs/Labs/Other Studies Reviewed:    The following studies were reviewed today: Echo 2019 shows normal function EF 65% with moderate LVH.  Chest x-ray showed borderline cardiomegaly  Coronary CT scan 09/16/2018: IMPRESSION: 1. Coronary artery calcium score 0 Agatston units, suggesting low risk for future cardiac events.   2.  No significant coronary disease.  High anterior take-off of RCA.    EKG:   Prior EKG showed sinus bradycardia 52 with no other abnormalities.  Prior note from ER reviewed. Recent Labs: 05/18/2021: ALT 9; TSH 0.97 10/31/2021: Magnesium 2.0 11/06/2021: BUN 16; Creatinine, Ser 0.95; Hemoglobin 12.8; Platelets 397; Potassium 3.2; Sodium 139  Recent Lipid Panel    Component Value Date/Time   CHOL 195 05/19/2018 1129   TRIG 41.0 05/19/2018 1129   HDL 83.80 05/19/2018 1129   CHOLHDL 2 05/19/2018 1129   VLDL 8.2 05/19/2018 1129   LDLCALC 103 (H) 05/19/2018 1129     Risk Assessment/Calculations:              Physical Exam:    VS:  BP 120/90 (BP Location: Left Arm, Patient Position: Sitting, Cuff Size: Normal)    Ht 5'  (1.524 m)    Wt 149 lb (67.6 kg)    SpO2 95%    BMI 29.10 kg/m     Wt Readings from Last 3 Encounters:  11/28/21 149 lb (67.6 kg)  11/06/21 147 lb (66.7 kg)  10/31/21 148 lb 6.4 oz (67.3 kg)     GEN:  Well nourished, well developed in no acute distress HEENT: Normal NECK: No JVD; No carotid bruits LYMPHATICS: No lymphadenopathy CARDIAC:  RRR, no murmurs, no rubs, gallops, left upper chest slight tenderness to palpation RESPIRATORY:  Clear to auscultation without rales, wheezing or rhonchi  ABDOMEN: Soft, non-tender, non-distended MUSCULOSKELETAL:  No edema; No deformity  SKIN: Warm and dry NEUROLOGIC:  Alert and oriented x 3 PSYCHIATRIC:  Normal affect   ASSESSMENT:    1. Chest pain of uncertain etiology   2. Essential hypertension    PLAN:    In order of problems listed above:  Chest pain of uncertain etiology Thankfully, prior cardiac CT in 2019 showed no evidence of coronary artery disease, calcium score was 0, this was extremely low risk.  Troponins were normal.  EKG unremarkable.  Some chest wall tenderness where she had her shingles.  Could be a degree of postherpetic neuralgia.  Continue with current treatment strategy.  Could also be musculoskeletal/anxiety type discomfort.  Deep breathing, stretching may help in the future.  Obviously if symptoms worsen or become more worrisome as time continues, we can always consider repeat of her CT scan to evaluate her coronary arteries.  Essential hypertension Overall well controlled.  No changes made today.  Continue with hydrochlorothiazide 25 mg for medical management.         Medication Adjustments/Labs and Tests Ordered: Current medicines are reviewed at length with the patient today.  Concerns regarding medicines are outlined above.  Orders Placed This Encounter  Procedures   EKG 12-Lead   No orders of the defined types were placed in this encounter.   Patient Instructions  Medication Instructions:  Your  physician recommends that you continue on your current medications as directed. Please refer to the Current Medication list given to you today.  *If you need a refill on your cardiac medications before your next appointment, please call your pharmacy*   Lab Work: None today  If you have labs (blood work) drawn today and your tests are completely normal, you will receive your results only by: [place] (if you have MyChart) OR A paper copy in the mail If you have any lab test that is abnormal or we need to change your treatment, we will call you to review the results.   Testing/Procedures: None today   Follow-Up: At [facility], you and your health needs are our priority.  As part of our continuing mission to provide you with exceptional heart care, we have created designated Provider Care Teams.  These Care Teams include your primary Cardiologist (physician) and Advanced Practice Providers (APPs -  Physician Assistants and Nurse Practitioners) who all work together to provide you with the care you need, when you need it.  We recommend signing up for the patient portal called "MyChart".  Sign up information is provided on this After Visit Summary.  MyChart is used to connect with patients for Virtual Visits (Telemedicine).  Patients are able to view lab/test results, encounter notes, upcoming appointments, etc.  Non-urgent messages can be sent to your provider as well.   To learn more about what you can do with MyChart, go to [email or web address].    Your next appointment:   1 year(s)  The format for your next appointment:   In Person  Provider:   Candee Furbish MD      Signed, Candee Furbish, MD  11/28/2021 4:52 PM    [place]

## 2021-11-28 NOTE — Assessment & Plan Note (Addendum)
Thankfully, prior cardiac CT in 2019 showed no evidence of coronary artery disease, calcium score was 0, this was extremely low risk.  Troponins were normal.  EKG unremarkable.  Some chest wall tenderness where she had her shingles.  Could be a degree of postherpetic neuralgia.  Continue with current treatment strategy.  Could also be musculoskeletal/anxiety type discomfort.  Deep breathing, stretching may help in the future.  Obviously if symptoms worsen or become more worrisome as time continues, we can always consider repeat of her CT scan to evaluate her coronary arteries.

## 2021-11-28 NOTE — Patient Instructions (Signed)
Medication Instructions:  Your physician recommends that you continue on your current medications as directed. Please refer to the Current Medication list given to you today.  *If you need a refill on your cardiac medications before your next appointment, please call your pharmacy*   Lab Work: None today  If you have labs (blood work) drawn today and your tests are completely normal, you will receive your results only by: [place] (if you have MyChart) OR A paper copy in the mail If you have any lab test that is abnormal or we need to change your treatment, we will call you to review the results.   Testing/Procedures: None today   Follow-Up: At [facility], you and your health needs are our priority.  As part of our continuing mission to provide you with exceptional heart care, we have created designated Provider Care Teams.  These Care Teams include your primary Cardiologist (physician) and Advanced Practice Providers (APPs -  Physician Assistants and Nurse Practitioners) who all work together to provide you with the care you need, when you need it.  We recommend signing up for the patient portal called "MyChart".  Sign up information is provided on this After Visit Summary.  MyChart is used to connect with patients for Virtual Visits (Telemedicine).  Patients are able to view lab/test results, encounter notes, upcoming appointments, etc.  Non-urgent messages can be sent to your provider as well.   To learn more about what you can do with MyChart, go to [email or web address].    Your next appointment:   1 year(s)  The format for your next appointment:   In Person  Provider:   Candee Furbish MD

## 2021-11-29 ENCOUNTER — Telehealth (INDEPENDENT_AMBULATORY_CARE_PROVIDER_SITE_OTHER): Payer: BC Managed Care – PPO | Admitting: Internal Medicine

## 2021-11-29 ENCOUNTER — Encounter: Payer: Self-pay | Admitting: Internal Medicine

## 2021-11-29 DIAGNOSIS — E876 Hypokalemia: Secondary | ICD-10-CM | POA: Diagnosis not present

## 2021-11-29 DIAGNOSIS — Z79899 Other long term (current) drug therapy: Secondary | ICD-10-CM | POA: Diagnosis not present

## 2021-11-29 DIAGNOSIS — I10 Essential (primary) hypertension: Secondary | ICD-10-CM

## 2021-11-29 DIAGNOSIS — Z8616 Personal history of COVID-19: Secondary | ICD-10-CM

## 2021-11-29 DIAGNOSIS — J45909 Unspecified asthma, uncomplicated: Secondary | ICD-10-CM

## 2021-11-29 DIAGNOSIS — R0989 Other specified symptoms and signs involving the circulatory and respiratory systems: Secondary | ICD-10-CM

## 2021-11-29 NOTE — Progress Notes (Signed)
Virtual Visit via Video Note  I connected with [name] on 11/29/21 at  8:30 AM EST by a video enabled telemedicine application and verified that I am speaking with the correct person using two identifiers. Location patient: home Location provider:home office Persons participating in the virtual visit: patient, provider  [place] national recommendations  regarding COVID 19 pandemic   video visit is advised over in office visit for this patient.  Patient aware  of the limitations of evaluation and management by telemedicine and  availability of in person appointments. and agreed to proceed.   HPI: [name] presents for video visit to review status FMLA and disability form follow-up of multiple symptoms over the last month in the absence from work.  Patient had a number of emergency room urgent care visits over the last month regarding viral type symptoms shortness of breath asthma flares and then chest pain.  She had her cardiology visit yesterday and fortunately was not felt to have cardiac disease or cause of her symptoms. She had not been on her Nexium but had continued her asthma medicines and that seems to have helped. 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  Thinks that has helped.  She continues on daily Dulera the valsartan HCTZ and as needed albuterol that she has not needed for a while she has been off prednisone for at least a few weeks. She will be seeing Dr. Melvyn Novas in July pulmonologist and yearly check with cardiology.    Last labs in the emergency room showed a potassium of 3.2 since that time has not had leg cramps or other changes consistent with low potassium.  And it has not been rechecked yet He is no longer taking a muscle relaxant Robaxin. ROS: See pertinent positives and negatives per HPI.  Past Medical History:  Diagnosis  Date   Allergic reaction caused by a drug 01/25/2013   probably.    Allergy    Anemia    Asthma    GERD (gastroesophageal reflux disease)    Heart murmur    Hypertension    Positive TB test    Shingles    TOBACCO USE 03/22/2009   Qualifier: Diagnosis of  By: Regis Bill MD, [name]  Stopped Dec 12 2010      Past Surgical History:  Procedure Laterality Date   ABDOMINAL HYSTERECTOMY     BREAST SURGERY     cyst from left breast   BUNIONECTOMY     both feet   CHOLECYSTECTOMY     CYSTECTOMY     left shoulder   KNEE SURGERY Left    PANENDOSCOPY     TONSILLECTOMY     TUBAL LIGATION     uterine ablastion     and polyps removed   Uterine polyps      Family History  Problem Relation Age of Onset   Hypertension Mother    Alzheimer's disease Mother    Hypertension Father    Prostate cancer Father    HIV Brother    Lupus Sister    Colon cancer Paternal Grandmother    Esophageal cancer Neg Hx    Liver cancer Neg Hx    Pancreatic cancer Neg Hx    Rectal cancer Neg Hx    Stomach cancer Neg Hx     Social History   Tobacco Use   Smoking  status: Former    Packs/day: 0.50    Years: 17.00    Pack years: 8.50    Types: Cigarettes    Quit date: 12/12/2010    Years since quitting: 10.9   Smokeless tobacco: Never  Vaping Use   Vaping Use: Never used  Substance Use Topics   Alcohol use: No   Drug use: No      Current Outpatient Medications:    albuterol (PROVENTIL) (2.5 MG/3ML) 0.083% nebulizer solution, Inhale 3 mLs (2.5 mg total) by nebulization every 6 (six) hours as needed for wheezing or shortness of breath., Disp: 125 mL, Rfl: 12   albuterol (VENTOLIN HFA) 108 (90 Base) MCG/ACT inhaler, Inhale 2 puffs into the lungs every 4 (four) hours as needed., Disp: 24 g, Rfl: 3   cyclobenzaprine (FLEXERIL) 10 MG tablet, Take 1 tablet by mouth at bedtime as needed., Disp: 30 tablet, Rfl: 0   EPINEPHRINE 0.3 mg/0.3 mL IJ SOAJ injection, INJECT 1 SYRINGE INTO THE MUSCLE AS NEEDED FOR  ANAPHYLAXIS, Disp: 2 each, Rfl: 1   famotidine (PEPCID) 20 MG tablet, One at bedtime (Patient taking differently: One at bedtime), Disp: 60 tablet, Rfl: 11   fluticasone (FLONASE) 50 MCG/ACT nasal spray, USE 2 SPRAYS IN EACH NOSTRIL DAILY, Disp: 48 g, Rfl: 2   hydrochlorothiazide (HYDRODIURIL) 25 MG tablet, Take 1 tablet (25 mg total) by mouth daily., Disp: 30 tablet, Rfl: 4   ibuprofen (ADVIL) 200 MG tablet, Take 400 mg by mouth every 6 (six) hours as needed for moderate pain., Disp: , Rfl:    mometasone-formoterol (DULERA) 100-5 MCG/ACT AERO, Take 2 puffs first thing in  the morning and then another 2 puffs about 12 hours later., Disp: 39 g, Rfl: 3   pantoprazole (PROTONIX) 40 MG tablet, TAKE ONE TABLET BY MOUTH TWICE DAILY BEFORE MEALS. TAKE 30 TO 60 MINUTES BEFORE FIRST MEAL OF THE DAY, Disp: 60 tablet, Rfl: 0   traMADol (ULTRAM) 50 MG tablet, Take 1 tablet by mouth every 8 hours as needed for severe pain., Disp: 10 tablet, Rfl: 0   valsartan (DIOVAN) 320 MG tablet, Take 0.5 tablets (160 mg total) by mouth daily., Disp: 30 tablet, Rfl: 2   VITAMIN D PO, Take 1 capsule by mouth daily., Disp: , Rfl:   EXAM: BP Readings from Last 3 Encounters:  11/28/21 120/90  11/06/21 129/79  10/31/21 (!) 150/90    VITALS per patient if applicable:  GENERAL: alert, oriented, appears well and in no acute distress  HEENT: atraumatic, conjunttiva clear, no obvious abnormalities on inspection of external nose and ears  NECK: normal movements of the head and neck  LUNGS: on inspection no signs of respiratory distress, breathing rate appears normal, no obvious gross SOB, gasping or wheezing  CV: no obvious cyanosis  MS: moves all visible extremities without noticeable abnormality  PSYCH/NEURO: pleasant and cooperative, no obvious depression or anxiety, speech and thought processing grossly intact Lab Results  Component Value Date   WBC 5.9 11/06/2021   HGB 12.8 11/06/2021   HCT 39.9 11/06/2021    PLT 397 11/06/2021   GLUCOSE 107 (H) 11/06/2021   CHOL 195 05/19/2018   TRIG 41.0 05/19/2018   HDL 83.80 05/19/2018   LDLCALC 103 (H) 05/19/2018   ALT 9 05/18/2021   AST 15 05/18/2021   NA 139 11/06/2021   K 3.2 (L) 11/06/2021   CL 103 11/06/2021   CREATININE 0.95 11/06/2021   BUN 16 11/06/2021   CO2 28 11/06/2021   TSH 0.97  05/18/2021   INR 1.0 01/17/2021   MICROALBUR 0.7 05/30/2009   Record review head CT angio 10/21/2021 and cardiac CTA in the past 2019. 5 urgent care or ED visits in the last month from December to January until back to work. She did miss work on January 13 to 14 for a vomiting illness since resolved and related. ASSESSMENT AND PLAN:  Discussed the following assessment and plan:    ICD-10-CM   1. Asthma, unspecified asthma severity, unspecified whether complicated, unspecified whether persistent  J45.909     2. Hypokalemia  V37.4 Basic metabolic panel    Magnesium    3. Essential hypertension  M27 Basic metabolic panel    Magnesium    4. Medication management  M78.675 Basic metabolic panel    Magnesium    5. History of 2019 novel coronavirus disease (COVID-19)  Z86.16     6. Respiratory symptoms  R09.89    record review :viral triggeredasthma and chest sx poss gerd  . not  cardiac  and now better accomodation at work with cleaner and recovered .      Improved stable back to work but at risk for episodic flares. I believe that her chest pressure was most likely related to her asthmatic plus reflux perhaps chest wall. He is relieved is not felt to be cardiac. Hypokalemia in the emergency room earlier this month no history of leg cramps at this point needs FU  Plan recheck BMP with magnesium nonfasting okay at her convenience.  Increase potassium rich foods   Then go from there. After results .when due   Will complete short-term disability and FMLA form. ( 7 pages) ( Out of work Dec 12 22 to Nov 05 2021 .  Counseled.   Expectant management and  discussion of plan and treatment with opportunity to ask questions and all were answered. The patient agreed with the plan and demonstrated an understanding of the instructions.   Advised to call back or seek an in-person evaluation if worsening  or having  further concerns  in interim. Return for depending on results, as indicated.    Shanon Ace, MD

## 2021-12-06 ENCOUNTER — Other Ambulatory Visit (INDEPENDENT_AMBULATORY_CARE_PROVIDER_SITE_OTHER): Payer: BC Managed Care – PPO

## 2021-12-06 DIAGNOSIS — E876 Hypokalemia: Secondary | ICD-10-CM

## 2021-12-06 DIAGNOSIS — Z79899 Other long term (current) drug therapy: Secondary | ICD-10-CM | POA: Diagnosis not present

## 2021-12-06 DIAGNOSIS — I10 Essential (primary) hypertension: Secondary | ICD-10-CM | POA: Diagnosis not present

## 2021-12-06 LAB — BASIC METABOLIC PANEL
BUN: 11 mg/dL (ref 6–23)
CO2: 33 mEq/L — ABNORMAL HIGH (ref 19–32)
Calcium: 9.6 mg/dL (ref 8.4–10.5)
Chloride: 99 mEq/L (ref 96–112)
Creatinine, Ser: 1.19 mg/dL (ref 0.40–1.20)
GFR: 51.7 mL/min — ABNORMAL LOW (ref >60.00)
Glucose, Bld: 102 mg/dL — ABNORMAL HIGH (ref 70–99)
Potassium: 3.2 mEq/L — ABNORMAL LOW (ref 3.5–5.1)
Sodium: 139 mEq/L (ref 135–145)

## 2021-12-06 LAB — MAGNESIUM: Magnesium: 2.2 mg/dL (ref 1.5–2.5)

## 2021-12-13 ENCOUNTER — Encounter: Payer: Self-pay | Admitting: Internal Medicine

## 2021-12-13 ENCOUNTER — Other Ambulatory Visit (HOSPITAL_COMMUNITY): Payer: Self-pay

## 2021-12-24 ENCOUNTER — Telehealth: Payer: Self-pay | Admitting: Internal Medicine

## 2021-12-24 NOTE — Telephone Encounter (Signed)
Pt will have [place] to refax FMLA paperwork again

## 2021-12-25 NOTE — Telephone Encounter (Signed)
Patient stated that insurance company is requesting Dr.[name] email.  Please advise.

## 2021-12-25 NOTE — Telephone Encounter (Signed)
I spoke with the pt and informed her that we cannot receive forms via email and forms will need to be mailed, dropped off or faxed to our office. Pt verbalized understanding. Pt informed me that information in FMLA must state that the pt suffers 3-4 episodes every 6 months for 2-3 days. Pt stated that we not specify the number of days she suffers from episodes as these may vary.

## 2021-12-25 NOTE — Telephone Encounter (Signed)
Noted  

## 2022-01-04 NOTE — Telephone Encounter (Addendum)
The form I received is a disability form    physician c statement  and not a fmla form   but they are asking a return to work  with restrictions  or accommodations . Marland Kitchen  Form doesn't make any sense for what is being asked .  ?She is back to work . Already . ?Just needs ability to be out if gets a significant respiratory allergy flare.   ? ? ? ? ?I filled out a similar form   previously Not sure what is the difference .  ? ?Please get  copy of previous completed form .  And confirm her first date back to work .  ?Thanks   ?

## 2022-01-28 ENCOUNTER — Encounter: Payer: Self-pay | Admitting: Family Medicine

## 2022-01-28 ENCOUNTER — Ambulatory Visit (INDEPENDENT_AMBULATORY_CARE_PROVIDER_SITE_OTHER): Payer: BC Managed Care – PPO | Admitting: Family Medicine

## 2022-01-28 VITALS — BP 150/90 | HR 69 | Temp 98.0°F | Ht 60.0 in | Wt 141.7 lb

## 2022-01-28 DIAGNOSIS — J029 Acute pharyngitis, unspecified: Secondary | ICD-10-CM

## 2022-01-28 DIAGNOSIS — J069 Acute upper respiratory infection, unspecified: Secondary | ICD-10-CM | POA: Diagnosis not present

## 2022-01-28 LAB — POCT RAPID STREP A (OFFICE): Rapid Strep A Screen: NEGATIVE

## 2022-01-28 LAB — POC COVID19 BINAXNOW: SARS Coronavirus 2 Ag: NEGATIVE

## 2022-01-28 MED ORDER — HYDROCODONE BIT-HOMATROP MBR 5-1.5 MG/5ML PO SOLN: 5.0000 mL | Freq: Four times a day (QID) | ORAL | 0 refills | Status: DC | PRN

## 2022-01-28 NOTE — Progress Notes (Signed)
? ?Established Patient Office Visit ? ?Subjective:  ?Patient ID: [name], female    DOB: May 05, 1966  Age: 56 y.o. MRN: [id number] ? ?CC:  ?Chief Complaint  ?Patient presents with  ? Sore Throat  ?  X2 days, Tried Dayquill with little relief   ? Nasal Congestion  ?  X2 days   ? Ear Fullness  ?  X2 days   ? ? ?HPI ?[name] presents for upper respiratory symptoms and cough.  Onset Saturday.  No fever.  She had some left-sided sore throat symptoms and intermittent sneezing.  Cough started last night.  Mostly nonproductive.  Also some right earache.  Denies any nausea, vomiting, or diarrhea.  No known sick contacts.  Increased body aches.  Took ibuprofen with some improvement. ? ?Past Medical History:  ?Diagnosis Date  ? Allergic reaction caused by a drug 01/25/2013  ? probably.   ? Allergy   ? Anemia   ? Asthma   ? GERD (gastroesophageal reflux disease)   ? Heart murmur   ? Hypertension   ? Positive TB test   ? Shingles   ? TOBACCO USE 03/22/2009  ? Qualifier: Diagnosis of  By: Regis Bill MD, [name]  Stopped Dec 12 2010    ? ? ?Past Surgical History:  ?Procedure Laterality Date  ? ABDOMINAL HYSTERECTOMY    ? BREAST SURGERY    ? cyst from left breast  ? BUNIONECTOMY    ? both feet  ? CHOLECYSTECTOMY    ? CYSTECTOMY    ? left shoulder  ? KNEE SURGERY Left   ? PANENDOSCOPY    ? TONSILLECTOMY    ? TUBAL LIGATION    ? uterine ablastion    ? and polyps removed  ? Uterine polyps    ? ? ?Family History  ?Problem Relation Age of Onset  ? Hypertension Mother   ? Alzheimer's disease Mother   ? Hypertension Father   ? Prostate cancer Father   ? HIV Brother   ? Lupus Sister   ? Colon cancer Paternal Grandmother   ? Esophageal cancer Neg Hx   ? Liver cancer Neg Hx   ? Pancreatic cancer Neg Hx   ? Rectal cancer Neg Hx   ? Stomach cancer Neg Hx   ? ? ?Social History  ? ?Socioeconomic History  ? Marital status: Divorced  ?  Spouse name: Not on file  ? Number of children: Not on file  ? Years of education: Not on file  ? Highest  education level: Not on file  ?Occupational History  ? Not on file  ?Tobacco Use  ? Smoking status: Former  ?  Packs/day: 0.50  ?  Years: 17.00  ?  Pack years: 8.50  ?  Types: Cigarettes  ?  Quit date: 12/12/2010  ?  Years since quitting: 11.1  ? Smokeless tobacco: Never  ?Vaping Use  ? Vaping Use: Never used  ?Substance and Sexual Activity  ? Alcohol use: No  ? Drug use: No  ? Sexual activity: Not on file  ?Other Topics Concern  ? Not on file  ?Social History Narrative  ? Divorced   ? Tobacco since age 52 stopped feb 2012  ? 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 Now works [employer]   ? ?Social Determinants of Health  ? ?Financial Resource Strain: Not on file  ?Food Insecurity: Not on file  ?Transportation Needs: Not on file  ?Physical Activity: Not on file  ?  Stress: Not on file  ?Social Connections: Not on file  ?Intimate Partner Violence: Not on file  ? ? ?Outpatient Medications Prior to Visit  ?Medication Sig Dispense Refill  ? albuterol (PROVENTIL) (2.5 MG/3ML) 0.083% nebulizer solution Inhale 3 mLs (2.5 mg total) by nebulization every 6 (six) hours as needed for wheezing or shortness of breath. 125 mL 12  ? albuterol (VENTOLIN HFA) 108 (90 Base) MCG/ACT inhaler Inhale 2 puffs into the lungs every 4 (four) hours as needed. 24 g 3  ? cyclobenzaprine (FLEXERIL) 10 MG tablet Take 1 tablet by mouth at bedtime as needed. 30 tablet 0  ? EPINEPHRINE 0.3 mg/0.3 mL IJ SOAJ injection INJECT 1 SYRINGE INTO THE MUSCLE AS NEEDED FOR ANAPHYLAXIS 2 each 1  ? famotidine (PEPCID) 20 MG tablet One at bedtime (Patient taking differently: One at bedtime) 60 tablet 11  ? fluticasone (FLONASE) 50 MCG/ACT nasal spray USE 2 SPRAYS IN EACH NOSTRIL DAILY 48 g 2  ? hydrochlorothiazide (HYDRODIURIL) 25 MG tablet Take 1 tablet (25 mg total) by mouth daily. 30 tablet 4  ? ibuprofen (ADVIL) 200 MG tablet Take 400 mg by mouth every 6 (six) hours as needed for moderate pain.    ? mometasone-formoterol (DULERA)  100-5 MCG/ACT AERO Take 2 puffs first thing in  the morning and then another 2 puffs about 12 hours later. 39 g 3  ? pantoprazole (PROTONIX) 40 MG tablet TAKE ONE TABLET BY MOUTH TWICE DAILY BEFORE MEALS. TAKE 30 TO 60 MINUTES BEFORE FIRST MEAL OF THE DAY 60 tablet 0  ? traMADol (ULTRAM) 50 MG tablet Take 1 tablet by mouth every 8 hours as needed for severe pain. 10 tablet 0  ? valsartan (DIOVAN) 320 MG tablet Take 0.5 tablets (160 mg total) by mouth daily. 30 tablet 2  ? VITAMIN D PO Take 1 capsule by mouth daily.    ? ?No facility-administered medications prior to visit.  ? ? ?Allergies  ?Allergen Reactions  ? Allegra [Fexofenadine] Hives  ? Cimetidine Hives  ?  REACTION: unspecified  ? Diovan Hct [Valsartan-Hydrochlorothiazide] Swelling  ?  Had facial swelling and throat itching after 2 doses of the generic had done well on the brand medicine for a while.  See ED visit February 2014  ? Purified Water [Water, Sterile] Anaphylaxis  ? Chocolate Other (See Comments)  ?  triggers Asthma  ? Citric Acid Other (See Comments)  ?  REACTION: swelling, hives, itching  ? Coly-Mycin S Other (See Comments)  ?  REACTION: pt reports she passed out  ? Peanut-Containing Drug Products Swelling  ? Valsartan Swelling  ? Amlodipine Other (See Comments)  ?  At 5 mg dose; swelling  ? ? ?ROS ?Review of Systems  ?Constitutional:  Positive for chills and fatigue.  ?HENT:  Positive for congestion and sore throat.   ?Respiratory:  Positive for cough.   ?Gastrointestinal:  Negative for diarrhea, nausea and vomiting.  ? ?  ?Objective:  ?  ?Physical Exam ?Vitals reviewed.  ?Constitutional:   ?   Appearance: She is well-developed.  ?HENT:  ?   Right Ear: Tympanic membrane normal.  ?   Left Ear: Tympanic membrane normal.  ?   Mouth/Throat:  ?   Mouth: Mucous membranes are moist.  ?   Pharynx: Oropharynx is clear. No oropharyngeal exudate.  ?Cardiovascular:  ?   Rate and Rhythm: Normal rate and regular rhythm.  ?Pulmonary:  ?   Effort: Pulmonary  effort is normal.  ?   Breath sounds:  No wheezing or rales.  ?Neurological:  ?   Mental Status: She is alert.  ? ? ?BP (!) 150/90 (BP Location: Left Arm, Patient Position: Sitting, Cuff Size: Normal)   Pulse 69   Temp 98 ?F (36.7 ?C) (Oral)   Ht 5' (1.524 m)   Wt 141 lb 11.2 oz (64.3 kg)   SpO2 97%   BMI 27.67 kg/m?  ?Wt Readings from Last 3 Encounters:  ?01/28/22 141 lb 11.2 oz (64.3 kg)  ?11/28/21 149 lb (67.6 kg)  ?11/06/21 147 lb (66.7 kg)  ? ? ? ?Health Maintenance Due  ?Topic Date Due  ? Hepatitis C Screening  Never done  ? TETANUS/TDAP  Never done  ? INFLUENZA VACCINE  06/04/2021  ? ? ?There are no preventive care reminders to display for this patient. ? ?Lab Results  ?Component Value Date  ? TSH 0.97 05/18/2021  ? ?Lab Results  ?Component Value Date  ? WBC 5.9 11/06/2021  ? HGB 12.8 11/06/2021  ? HCT 39.9 11/06/2021  ? MCV 85.4 11/06/2021  ? PLT 397 11/06/2021  ? ?Lab Results  ?Component Value Date  ? NA 139 12/06/2021  ? K 3.2 (L) 12/06/2021  ? CO2 33 (H) 12/06/2021  ? GLUCOSE 102 (H) 12/06/2021  ? BUN 11 12/06/2021  ? CREATININE 1.19 12/06/2021  ? BILITOT 0.3 05/18/2021  ? ALKPHOS 54 05/18/2021  ? AST 15 05/18/2021  ? ALT 9 05/18/2021  ? PROT 7.2 05/18/2021  ? ALBUMIN 4.3 05/18/2021  ? CALCIUM 9.6 12/06/2021  ? ANIONGAP 8 11/06/2021  ? GFR 51.70 (L) 12/06/2021  ? ?Lab Results  ?Component Value Date  ? CHOL 195 05/19/2018  ? ?Lab Results  ?Component Value Date  ? HDL 83.80 05/19/2018  ? ?Lab Results  ?Component Value Date  ? LDLCALC 103 (H) 05/19/2018  ? ?Lab Results  ?Component Value Date  ? TRIG 41.0 05/19/2018  ? ?Lab Results  ?Component Value Date  ? CHOLHDL 2 05/19/2018  ? ?No results found for: HGBA1C ? ?  ?Assessment & Plan:  ? ?Probable viral URI with cough.  Rapid strep negative.  COVID-negative.  Treat symptomatically.  Hycodan cough syrup 1 teaspoon nightly for severe cough.  She states she has taken Tessalon in the past without much success.  No relief with over-the-counter cough  medications.  Continue ibuprofen as needed. ? ?Work note provided from 01/27/2022 through the 30th ? ? ?Meds ordered this encounter  ?Medications  ? HYDROcodone bit-homatropine (HYCODAN) 5-1.5 MG/5ML syrup  ?  Sig: Take

## 2022-01-30 ENCOUNTER — Ambulatory Visit (INDEPENDENT_AMBULATORY_CARE_PROVIDER_SITE_OTHER): Payer: BC Managed Care – PPO | Admitting: Family Medicine

## 2022-01-30 ENCOUNTER — Encounter: Payer: Self-pay | Admitting: Family Medicine

## 2022-01-30 VITALS — BP 148/90 | HR 77 | Temp 97.9°F | Ht 60.0 in | Wt 150.0 lb

## 2022-01-30 DIAGNOSIS — R059 Cough, unspecified: Secondary | ICD-10-CM

## 2022-01-30 MED ORDER — PREDNISONE 20 MG PO TABS: ORAL_TABLET | ORAL | 0 refills | Status: DC

## 2022-01-30 MED ORDER — AZITHROMYCIN 250 MG PO TABS: ORAL_TABLET | ORAL | 0 refills | Status: AC

## 2022-01-30 NOTE — Progress Notes (Signed)
? ?Established Patient Office Visit ? ?Subjective:  ?Patient ID: [name], female    DOB: 21-May-1966  Age: 56 y.o. MRN: [id number] ? ?CC: No chief complaint on file. ? ? ?HPI ?[name] presents for severe cough.  Refer to previous note for details.  COVID testing was negative.  Cough is mostly dry.  She does have history of asthma and feels like she may be wheezing some at night.  We did not note any significant wheezing on exam.  She has both MDI albuterol and nebulizer and using these with some temporary relief.  No fever.  No hemoptysis.  No chills.  She is also asking for extension of work note.  She was supposed to go back tomorrow. ? ?Past Medical History:  ?Diagnosis Date  ? Allergic reaction caused by a drug 01/25/2013  ? probably.   ? Allergy   ? Anemia   ? Asthma   ? GERD (gastroesophageal reflux disease)   ? Heart murmur   ? Hypertension   ? Positive TB test   ? Shingles   ? TOBACCO USE 03/22/2009  ? Qualifier: Diagnosis of  By: Regis Bill MD, [name]  Stopped Dec 12 2010    ? ? ?Past Surgical History:  ?Procedure Laterality Date  ? ABDOMINAL HYSTERECTOMY    ? BREAST SURGERY    ? cyst from left breast  ? BUNIONECTOMY    ? both feet  ? CHOLECYSTECTOMY    ? CYSTECTOMY    ? left shoulder  ? KNEE SURGERY Left   ? PANENDOSCOPY    ? TONSILLECTOMY    ? TUBAL LIGATION    ? uterine ablastion    ? and polyps removed  ? Uterine polyps    ? ? ?Family History  ?Problem Relation Age of Onset  ? Hypertension Mother   ? Alzheimer's disease Mother   ? Hypertension Father   ? Prostate cancer Father   ? HIV Brother   ? Lupus Sister   ? Colon cancer Paternal Grandmother   ? Esophageal cancer Neg Hx   ? Liver cancer Neg Hx   ? Pancreatic cancer Neg Hx   ? Rectal cancer Neg Hx   ? Stomach cancer Neg Hx   ? ? ?Social History  ? ?Socioeconomic History  ? Marital status: Divorced  ?  Spouse name: Not on file  ? Number of children: Not on file  ? Years of education: Not on file  ? Highest education level: Not on file   ?Occupational History  ? Not on file  ?Tobacco Use  ? Smoking status: Former  ?  Packs/day: 0.50  ?  Years: 17.00  ?  Pack years: 8.50  ?  Types: Cigarettes  ?  Quit date: 12/12/2010  ?  Years since quitting: 11.1  ? Smokeless tobacco: Never  ?Vaping Use  ? Vaping Use: Never used  ?Substance and Sexual Activity  ? Alcohol use: No  ? Drug use: No  ? Sexual activity: Not on file  ?Other Topics Concern  ? Not on file  ?Social History Narrative  ? Divorced   ? Tobacco since age 67 stopped feb 2012  ? 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 Now works [employer]   ? ?Social Determinants of Health  ? ?Financial Resource Strain: Not on file  ?Food Insecurity: Not on file  ?Transportation Needs: Not on file  ?Physical Activity: Not on file  ?Stress: Not on file  ?Social  Connections: Not on file  ?Intimate Partner Violence: Not on file  ? ? ?Outpatient Medications Prior to Visit  ?Medication Sig Dispense Refill  ? albuterol (PROVENTIL) (2.5 MG/3ML) 0.083% nebulizer solution Inhale 3 mLs (2.5 mg total) by nebulization every 6 (six) hours as needed for wheezing or shortness of breath. 125 mL 12  ? albuterol (VENTOLIN HFA) 108 (90 Base) MCG/ACT inhaler Inhale 2 puffs into the lungs every 4 (four) hours as needed. 24 g 3  ? cyclobenzaprine (FLEXERIL) 10 MG tablet Take 1 tablet by mouth at bedtime as needed. 30 tablet 0  ? EPINEPHRINE 0.3 mg/0.3 mL IJ SOAJ injection INJECT 1 SYRINGE INTO THE MUSCLE AS NEEDED FOR ANAPHYLAXIS 2 each 1  ? famotidine (PEPCID) 20 MG tablet One at bedtime (Patient taking differently: One at bedtime) 60 tablet 11  ? fluticasone (FLONASE) 50 MCG/ACT nasal spray USE 2 SPRAYS IN EACH NOSTRIL DAILY 48 g 2  ? hydrochlorothiazide (HYDRODIURIL) 25 MG tablet Take 1 tablet (25 mg total) by mouth daily. 30 tablet 4  ? HYDROcodone bit-homatropine (HYCODAN) 5-1.5 MG/5ML syrup Take 5 mLs by mouth every 6 (six) hours as needed for cough. 120 mL 0  ? ibuprofen (ADVIL) 200 MG tablet Take 400  mg by mouth every 6 (six) hours as needed for moderate pain.    ? mometasone-formoterol (DULERA) 100-5 MCG/ACT AERO Take 2 puffs first thing in  the morning and then another 2 puffs about 12 hours later. 39 g 3  ? pantoprazole (PROTONIX) 40 MG tablet TAKE ONE TABLET BY MOUTH TWICE DAILY BEFORE MEALS. TAKE 30 TO 60 MINUTES BEFORE FIRST MEAL OF THE DAY 60 tablet 0  ? traMADol (ULTRAM) 50 MG tablet Take 1 tablet by mouth every 8 hours as needed for severe pain. 10 tablet 0  ? valsartan (DIOVAN) 320 MG tablet Take 0.5 tablets (160 mg total) by mouth daily. 30 tablet 2  ? VITAMIN D PO Take 1 capsule by mouth daily.    ? ?No facility-administered medications prior to visit.  ? ? ?Allergies  ?Allergen Reactions  ? Allegra [Fexofenadine] Hives  ? Cimetidine Hives  ?  REACTION: unspecified  ? Diovan Hct [Valsartan-Hydrochlorothiazide] Swelling  ?  Had facial swelling and throat itching after 2 doses of the generic had done well on the brand medicine for a while.  See ED visit February 2014  ? Purified Water [Water, Sterile] Anaphylaxis  ? Chocolate Other (See Comments)  ?  triggers Asthma  ? Citric Acid Other (See Comments)  ?  REACTION: swelling, hives, itching  ? Coly-Mycin S Other (See Comments)  ?  REACTION: pt reports she passed out  ? Peanut-Containing Drug Products Swelling  ? Valsartan Swelling  ? Amlodipine Other (See Comments)  ?  At 5 mg dose; swelling  ? ? ?ROS ?Review of Systems  ?Constitutional:  Negative for chills and fever.  ?Respiratory:  Positive for cough and wheezing.   ? ?  ?Objective:  ?  ?Physical Exam ?Vitals reviewed.  ?Constitutional:   ?   Comments: Coughing frequently during exam but in no respiratory distress.  ?Cardiovascular:  ?   Rate and Rhythm: Normal rate and regular rhythm.  ?Pulmonary:  ?   Effort: Pulmonary effort is normal.  ?   Breath sounds: Normal breath sounds. No wheezing or rales.  ?Neurological:  ?   Mental Status: She is alert.  ? ? ?BP (!) 148/90 (BP Location: Left Arm,  Patient Position: Sitting, Cuff Size: Normal)   Pulse 77  Temp 97.9 ?F (36.6 ?C) (Oral)   Ht 5' (1.524 m)   Wt 150 lb (68 kg)   SpO2 97%   BMI 29.29 kg/m?  ?Wt Readings from Last 3 Encounters:  ?01/30/22 150 lb (68 kg)  ?01/28/22 141 lb 11.2 oz (64.3 kg)  ?11/28/21 149 lb (67.6 kg)  ? ? ? ?Health Maintenance Due  ?Topic Date Due  ? Hepatitis C Screening  Never done  ? TETANUS/TDAP  Never done  ? INFLUENZA VACCINE  06/04/2021  ? ? ?There are no preventive care reminders to display for this patient. ? ?Lab Results  ?Component Value Date  ? TSH 0.97 05/18/2021  ? ?Lab Results  ?Component Value Date  ? WBC 5.9 11/06/2021  ? HGB 12.8 11/06/2021  ? HCT 39.9 11/06/2021  ? MCV 85.4 11/06/2021  ? PLT 397 11/06/2021  ? ?Lab Results  ?Component Value Date  ? NA 139 12/06/2021  ? K 3.2 (L) 12/06/2021  ? CO2 33 (H) 12/06/2021  ? GLUCOSE 102 (H) 12/06/2021  ? BUN 11 12/06/2021  ? CREATININE 1.19 12/06/2021  ? BILITOT 0.3 05/18/2021  ? ALKPHOS 54 05/18/2021  ? AST 15 05/18/2021  ? ALT 9 05/18/2021  ? PROT 7.2 05/18/2021  ? ALBUMIN 4.3 05/18/2021  ? CALCIUM 9.6 12/06/2021  ? ANIONGAP 8 11/06/2021  ? GFR 51.70 (L) 12/06/2021  ? ?Lab Results  ?Component Value Date  ? CHOL 195 05/19/2018  ? ?Lab Results  ?Component Value Date  ? HDL 83.80 05/19/2018  ? ?Lab Results  ?Component Value Date  ? LDLCALC 103 (H) 05/19/2018  ? ?Lab Results  ?Component Value Date  ? TRIG 41.0 05/19/2018  ? ?Lab Results  ?Component Value Date  ? CHOLHDL 2 05/19/2018  ? ?No results found for: HGBA1C ? ?  ?Assessment & Plan:  ? ?Persistent cough.  Suspect recent viral infection.  She does have history of asthma but no wheezing noted on exam at this time.  May still have reactive airway component.  She feels like her wheezing is worse at night. ? ?-Add prednisone 20 mg 2 tablets daily for 6 days ?-Continue albuterol MDI as needed ?-Add Zithromax for possible atypical coverage for 5 days ?-Work extension given through April 3 ?-Follow-up for any persistent  or worsening symptoms ? ?Meds ordered this encounter  ?Medications  ? predniSONE (DELTASONE) 20 MG tablet  ?  Sig: Take two tablets by mouth once daily for 6 days  ?  Dispense:  12 tablet  ?  Refill:  0  ? azithromycin

## 2022-02-01 ENCOUNTER — Telehealth: Payer: Self-pay

## 2022-02-01 NOTE — Telephone Encounter (Signed)
My nurse spoke with her today and apparently she is feeling better.  She will finish out the Zithromax and the prednisone and be in touch early next week if not continuing to improve. ?

## 2022-02-01 NOTE — Telephone Encounter (Signed)
--  Caller states she was seen on Monday and again ?yesterday and Dx with a virus, Rx given for with ?Prednisone and Azithromycin yesterday, last night ?started coughing up green mucus, has some SOB, has ?a neb machine and used it yesterday, has some SOB ?now, has chest tightness, weakness, fatigue, is drinking ?fluids and voiding, ? ?01/31/2022 1:10:04 PM See PCP within 24 Hours Scott, RN, Malachy Mood ? ?Comments ?User: Burna Sis, RN Date/Time Eilene Ghazi Time): 01/31/2022 1:11:15 PM ?Transferred to office and instructed to let them know she has spoken with triage nurse and will need to be seen in ?next 24 hrs, if unable to get appt go to [facility] ? ?Referrals ?REFERRED TO PCP OFFICE ?

## 2022-02-01 NOTE — Telephone Encounter (Signed)
I spoke with the pt and she stated she is feeling better on Azithromycin and wanted to cancel appointment for Monday. Pt stated she would call into the office if she is not feeling better by next week ?

## 2022-02-04 ENCOUNTER — Encounter: Payer: Self-pay | Admitting: Internal Medicine

## 2022-02-04 ENCOUNTER — Ambulatory Visit: Payer: Medicaid Other | Admitting: Family Medicine

## 2022-02-06 ENCOUNTER — Encounter: Payer: Self-pay | Admitting: Family Medicine

## 2022-02-06 ENCOUNTER — Ambulatory Visit (INDEPENDENT_AMBULATORY_CARE_PROVIDER_SITE_OTHER): Payer: BC Managed Care – PPO

## 2022-02-06 ENCOUNTER — Ambulatory Visit (INDEPENDENT_AMBULATORY_CARE_PROVIDER_SITE_OTHER): Payer: BC Managed Care – PPO | Admitting: Family Medicine

## 2022-02-06 VITALS — BP 130/82 | HR 73 | Temp 98.1°F | Ht 60.0 in | Wt 148.4 lb

## 2022-02-06 DIAGNOSIS — R059 Cough, unspecified: Secondary | ICD-10-CM | POA: Diagnosis not present

## 2022-02-06 MED ORDER — HYDROCODONE BIT-HOMATROP MBR 5-1.5 MG/5ML PO SOLN: 5.0000 mL | Freq: Four times a day (QID) | ORAL | 0 refills | Status: DC | PRN

## 2022-02-06 MED ORDER — AMOXICILLIN-POT CLAVULANATE 875-125 MG PO TABS: 1.0000 | ORAL_TABLET | Freq: Two times a day (BID) | ORAL | 0 refills | Status: DC

## 2022-02-06 NOTE — Progress Notes (Signed)
? ?Established Patient Office Visit ? ?Subjective:  ?Patient ID: [name], female    DOB: [birth year]  Age: 56 y.o. MRN: [id number] ? ?CC:  ?Chief Complaint  ?Patient presents with  ? Follow-up  ? Nausea  ? ? ?HPI ?[name] presents for relapse of increased respiratory symptoms.  Seen a couple times recently as per previous notes from March.  She had fairly severe cough and we sent in cough suppressant (Hycodan).  She felt like she was improving some and then couple days ago her cough got worse again especially at night.  Question of low-grade fever yesterday but none today.  She had some occasional nausea without vomiting.  No diarrhea.  Recently treated with prednisone and Zithromax.  No major dyspnea.  No pleuritic pain.  No hemoptysis.  Former smoker.  Quit around 2012. ? ?Past Medical History:  ?Diagnosis Date  ? Allergic reaction caused by a drug 01/25/2013  ? probably.   ? Allergy   ? Anemia   ? Asthma   ? GERD (gastroesophageal reflux disease)   ? Heart murmur   ? Hypertension   ? Positive TB test   ? Shingles   ? TOBACCO USE 03/22/2009  ? Qualifier: Diagnosis of  By: Regis Bill MD, [name]  Stopped Dec 12 2010    ? ? ?Past Surgical History:  ?Procedure Laterality Date  ? ABDOMINAL HYSTERECTOMY    ? BREAST SURGERY    ? cyst from left breast  ? BUNIONECTOMY    ? both feet  ? CHOLECYSTECTOMY    ? CYSTECTOMY    ? left shoulder  ? KNEE SURGERY Left   ? PANENDOSCOPY    ? TONSILLECTOMY    ? TUBAL LIGATION    ? uterine ablastion    ? and polyps removed  ? Uterine polyps    ? ? ?Family History  ?Problem Relation Age of Onset  ? Hypertension Mother   ? Alzheimer's disease Mother   ? Hypertension Father   ? Prostate cancer Father   ? HIV Brother   ? Lupus Sister   ? Colon cancer Paternal Grandmother   ? Esophageal cancer Neg Hx   ? Liver cancer Neg Hx   ? Pancreatic cancer Neg Hx   ? Rectal cancer Neg Hx   ? Stomach cancer Neg Hx   ? ? ?Social History  ? ?Socioeconomic History  ? Marital status: Divorced  ?   Spouse name: Not on file  ? Number of children: Not on file  ? Years of education: Not on file  ? Highest education level: Not on file  ?Occupational History  ? Not on file  ?Tobacco Use  ? Smoking status: Former  ?  Packs/day: 0.50  ?  Years: 17.00  ?  Pack years: 8.50  ?  Types: Cigarettes  ?  Quit date: 12/12/2010  ?  Years since quitting: 11.1  ? Smokeless tobacco: Never  ?Vaping Use  ? Vaping Use: Never used  ?Substance and Sexual Activity  ? Alcohol use: No  ? Drug use: No  ? Sexual activity: Not on file  ?Other Topics Concern  ? Not on file  ?Social History Narrative  ? Divorced   ? Tobacco since age 46 stopped feb 2012  ? 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 Now works [employer]   ? ?Social Determinants of Health  ? ?Financial Resource Strain: Not on file  ?Food Insecurity: Not on file  ?Transportation  Needs: Not on file  ?Physical Activity: Not on file  ?Stress: Not on file  ?Social Connections: Not on file  ?Intimate Partner Violence: Not on file  ? ? ?Outpatient Medications Prior to Visit  ?Medication Sig Dispense Refill  ? albuterol (PROVENTIL) (2.5 MG/3ML) 0.083% nebulizer solution Inhale 3 mLs (2.5 mg total) by nebulization every 6 (six) hours as needed for wheezing or shortness of breath. 125 mL 12  ? albuterol (VENTOLIN HFA) 108 (90 Base) MCG/ACT inhaler Inhale 2 puffs into the lungs every 4 (four) hours as needed. 24 g 3  ? cyclobenzaprine (FLEXERIL) 10 MG tablet Take 1 tablet by mouth at bedtime as needed. 30 tablet 0  ? EPINEPHRINE 0.3 mg/0.3 mL IJ SOAJ injection INJECT 1 SYRINGE INTO THE MUSCLE AS NEEDED FOR ANAPHYLAXIS 2 each 1  ? famotidine (PEPCID) 20 MG tablet One at bedtime (Patient taking differently: One at bedtime) 60 tablet 11  ? fluticasone (FLONASE) 50 MCG/ACT nasal spray USE 2 SPRAYS IN EACH NOSTRIL DAILY 48 g 2  ? hydrochlorothiazide (HYDRODIURIL) 25 MG tablet Take 1 tablet (25 mg total) by mouth daily. 30 tablet 4  ? ibuprofen (ADVIL) 200 MG tablet  Take 400 mg by mouth every 6 (six) hours as needed for moderate pain.    ? mometasone-formoterol (DULERA) 100-5 MCG/ACT AERO Take 2 puffs first thing in  the morning and then another 2 puffs about 12 hours later. 39 g 3  ? pantoprazole (PROTONIX) 40 MG tablet TAKE ONE TABLET BY MOUTH TWICE DAILY BEFORE MEALS. TAKE 30 TO 60 MINUTES BEFORE FIRST MEAL OF THE DAY 60 tablet 0  ? predniSONE (DELTASONE) 20 MG tablet Take two tablets by mouth once daily for 6 days 12 tablet 0  ? traMADol (ULTRAM) 50 MG tablet Take 1 tablet by mouth every 8 hours as needed for severe pain. 10 tablet 0  ? valsartan (DIOVAN) 320 MG tablet Take 0.5 tablets (160 mg total) by mouth daily. 30 tablet 2  ? VITAMIN D PO Take 1 capsule by mouth daily.    ? HYDROcodone bit-homatropine (HYCODAN) 5-1.5 MG/5ML syrup Take 5 mLs by mouth every 6 (six) hours as needed for cough. 120 mL 0  ? ?No facility-administered medications prior to visit.  ? ? ?Allergies  ?Allergen Reactions  ? Allegra [Fexofenadine] Hives  ? Cimetidine Hives  ?  REACTION: unspecified  ? Diovan Hct [Valsartan-Hydrochlorothiazide] Swelling  ?  Had facial swelling and throat itching after 2 doses of the generic had done well on the brand medicine for a while.  See ED visit February 2014  ? Purified Water [Water, Sterile] Anaphylaxis  ? Chocolate Other (See Comments)  ?  triggers Asthma  ? Citric Acid Other (See Comments)  ?  REACTION: swelling, hives, itching  ? Coly-Mycin S Other (See Comments)  ?  REACTION: pt reports she passed out  ? Peanut-Containing Drug Products Swelling  ? Valsartan Swelling  ? Amlodipine Other (See Comments)  ?  At 5 mg dose; swelling  ? ? ?ROS ?Review of Systems  ?Constitutional:  Positive for fatigue. Negative for chills and fever.  ?Respiratory:  Positive for cough. Negative for shortness of breath.   ?Cardiovascular:  Negative for chest pain.  ?Gastrointestinal:  Positive for nausea. Negative for diarrhea and vomiting.  ? ?  ?Objective:  ?  ?Physical  Exam ?Vitals reviewed.  ?Constitutional:   ?   Appearance: Normal appearance.  ?Cardiovascular:  ?   Rate and Rhythm: Normal rate and regular rhythm.  ?  Pulmonary:  ?   Effort: Pulmonary effort is normal.  ?   Breath sounds: No wheezing or rales.  ?   Comments: Slightly diminished breath sounds right base compared with left ?Neurological:  ?   Mental Status: She is alert.  ? ? ?BP 130/82 (BP Location: Left Arm, Patient Position: Sitting, Cuff Size: Normal)   Pulse 73   Temp 98.1 ?F (36.7 ?C) (Oral)   Ht 5' (1.524 m)   Wt 148 lb 6.4 oz (67.3 kg)   SpO2 98%   BMI 28.98 kg/m?  ?Wt Readings from Last 3 Encounters:  ?02/06/22 148 lb 6.4 oz (67.3 kg)  ?01/30/22 150 lb (68 kg)  ?01/28/22 141 lb 11.2 oz (64.3 kg)  ? ? ? ?Health Maintenance Due  ?Topic Date Due  ? Hepatitis C Screening  Never done  ? TETANUS/TDAP  Never done  ? ? ?There are no preventive care reminders to display for this patient. ? ?Lab Results  ?Component Value Date  ? TSH 0.97 05/18/2021  ? ?Lab Results  ?Component Value Date  ? WBC 5.9 11/06/2021  ? HGB 12.8 11/06/2021  ? HCT 39.9 11/06/2021  ? MCV 85.4 11/06/2021  ? PLT 397 11/06/2021  ? ?Lab Results  ?Component Value Date  ? NA 139 12/06/2021  ? K 3.2 (L) 12/06/2021  ? CO2 33 (H) 12/06/2021  ? GLUCOSE 102 (H) 12/06/2021  ? BUN 11 12/06/2021  ? CREATININE 1.19 12/06/2021  ? BILITOT 0.3 05/18/2021  ? ALKPHOS 54 05/18/2021  ? AST 15 05/18/2021  ? ALT 9 05/18/2021  ? PROT 7.2 05/18/2021  ? ALBUMIN 4.3 05/18/2021  ? CALCIUM 9.6 12/06/2021  ? ANIONGAP 8 11/06/2021  ? GFR 51.70 (L) 12/06/2021  ? ?Lab Results  ?Component Value Date  ? CHOL 195 05/19/2018  ? ?Lab Results  ?Component Value Date  ? HDL 83.80 05/19/2018  ? ?Lab Results  ?Component Value Date  ? LDLCALC 103 (H) 05/19/2018  ? ?Lab Results  ?Component Value Date  ? TRIG 41.0 05/19/2018  ? ?Lab Results  ?Component Value Date  ? CHOLHDL 2 05/19/2018  ? ?No results found for: HGBA1C ? ?  ?Assessment & Plan:  ? ?Problem List Items Addressed This  Visit   ?None ?Visit Diagnoses   ? ? Cough, unspecified type    -  Primary  ? Relevant Orders  ? DG Chest 2 View  ? ?  ?Recent onset of respiratory illness.  She was improving and then seemed to relapse couple days ago with

## 2022-02-06 NOTE — Patient Instructions (Signed)
Follow up for any increased shortness of breath or increased fever.  ?

## 2022-02-07 MED ORDER — VALSARTAN 320 MG PO TABS
160.0000 mg | ORAL_TABLET | Freq: Every day | ORAL | 1 refills | Status: DC
Start: 2022-02-07 — End: 2023-02-28

## 2022-02-07 MED ORDER — HYDROCHLOROTHIAZIDE 25 MG PO TABS: 25.0000 mg | ORAL_TABLET | Freq: Every day | ORAL | 1 refills | Status: DC

## 2022-02-11 ENCOUNTER — Telehealth (INDEPENDENT_AMBULATORY_CARE_PROVIDER_SITE_OTHER): Payer: BC Managed Care – PPO | Admitting: Family Medicine

## 2022-02-11 DIAGNOSIS — K30 Functional dyspepsia: Secondary | ICD-10-CM | POA: Diagnosis not present

## 2022-02-11 DIAGNOSIS — R197 Diarrhea, unspecified: Secondary | ICD-10-CM | POA: Diagnosis not present

## 2022-02-11 DIAGNOSIS — R110 Nausea: Secondary | ICD-10-CM | POA: Diagnosis not present

## 2022-02-11 MED ORDER — ONDANSETRON HCL 4 MG PO TABS: 4.0000 mg | ORAL_TABLET | Freq: Three times a day (TID) | ORAL | 0 refills | Status: DC | PRN

## 2022-02-11 NOTE — Patient Instructions (Addendum)
? ? ? ?--------------------------------------------------------------------------------------------------------------------------- ? ? ? ?  WORK SLIP: ? ?Patient [name],  06-11-1966, was seen for a medical visit today, 02/11/22 . Please excuse from work for a COVID/flu like illness. Advise negative covid test and resolution of dairrhea and feeling better for 24 hours before return to work.  ? ?Sincerely: ?E-signature: Dr. Colin Benton, DO ?[facility] ?Ph: [phone] ? ? ?------------------------------------------------------------------------------------------------------------------------------ ? ? ?-I sent the medication(s) we discussed to your pharmacy: ?Meds ordered this encounter  ?Medications  ? ondansetron (ZOFRAN) 4 MG tablet  ?  Sig: Take 1 tablet (4 mg total) by mouth every 8 (eight) hours as needed for nausea or vomiting.  ?  Dispense:  20 tablet  ?  Refill:  0  ? ? ? ?I hope you are feeling better soon! ? ?Seek in person care promptly if your symptoms worsen, new concerns arise or you are not improving with treatment. ? ?It was nice to meet you today. I help [facility] out with telemedicine visits on Tuesdays and Thursdays usually; I'll be available for telemedicine visits Monday, Thursday and Friday this week. Otherwise, if you have any concerns or questions following this visit please schedule a follow up visit with your Primary Care office or seek care at a local urgent care clinic to avoid delays in care ? ?

## 2022-02-11 NOTE — Progress Notes (Signed)
Virtual Visit via Video Note ? ?I connected with [name] ? on 02/11/22 at  1:00 PM EDT by a video enabled telemedicine application and verified that I am speaking with the correct person using two identifiers. ? Location patient: [facility] ?Location provider:work or home office ?Persons participating in the virtual visit: patient, provider ? ?I discussed the limitations and requested verbal permission for telemedicine visit. The patient expressed understanding and agreed to proceed. ? ? ?HPI: ? ?Acute telemedicine visit for nausea: ?-about 2 week ago got a resp virus - she reports she saw Dr. Elease Hashimoto for this a few times ?-cough/respiratory issues doing better, occ cough only now ?-reports has xray on Monday which per report: "Stable exam. No active cardiopulmonary disease." ?-was on azithromycin and then amoxicillin which she finished yesterday ?-but had multiple episodes of watery diarrhea 3 days ago, diarrhea now resolved, but still feels nauseous and stomach feels upset ?-Symptoms include: currently nausea, upset stomach, acid reflux ?-she is on nexium for this ?-Denies:fever, melena, hematochezia, vomiting, inability to tolerate oral intake  ?-Pertinent past medical history: see below ?-Pertinent medication allergies: ?Allergies  ?Allergen Reactions  ? Allegra [Fexofenadine] Hives  ? Cimetidine Hives  ?  REACTION: unspecified  ? Diovan Hct [Valsartan-Hydrochlorothiazide] Swelling  ?  Had facial swelling and throat itching after 2 doses of the generic had done well on the brand medicine for a while.  See ED visit February 2014  ? Purified Water [Water, Sterile] Anaphylaxis  ? Chocolate Other (See Comments)  ?  triggers Asthma  ? Citric Acid Other (See Comments)  ?  REACTION: swelling, hives, itching  ? Coly-Mycin S Other (See Comments)  ?  REACTION: pt reports she passed out  ? Peanut-Containing Drug Products Swelling  ? Valsartan Swelling  ? Amlodipine Other (See Comments)  ?  At 5 mg dose; swelling  ?-COVID-19 vaccine  status:  ?Immunization History  ?Administered Date(s) Administered  ? Influenza Split 08/04/2013  ? Influenza Whole 08/04/2009  ? Influenza,inj,Quad PF,6+ Mos 08/28/2018  ? ? ? ?ROS: See pertinent positives and negatives per HPI. ? ?Past Medical History:  ?Diagnosis Date  ? Allergic reaction caused by a drug 01/25/2013  ? probably.   ? Allergy   ? Anemia   ? Asthma   ? GERD (gastroesophageal reflux disease)   ? Heart murmur   ? Hypertension   ? Positive TB test   ? Shingles   ? TOBACCO USE 03/22/2009  ? Qualifier: Diagnosis of  By: Regis Bill MD, [name]  Stopped Dec 12 2010    ? ? ?Past Surgical History:  ?Procedure Laterality Date  ? ABDOMINAL HYSTERECTOMY    ? BREAST SURGERY    ? cyst from left breast  ? BUNIONECTOMY    ? both feet  ? CHOLECYSTECTOMY    ? CYSTECTOMY    ? left shoulder  ? KNEE SURGERY Left   ? PANENDOSCOPY    ? TONSILLECTOMY    ? TUBAL LIGATION    ? uterine ablastion    ? and polyps removed  ? Uterine polyps    ? ? ? ?Current Outpatient Medications:  ?  ondansetron (ZOFRAN) 4 MG tablet, Take 1 tablet (4 mg total) by mouth every 8 (eight) hours as needed for nausea or vomiting., Disp: 20 tablet, Rfl: 0 ?  albuterol (PROVENTIL) (2.5 MG/3ML) 0.083% nebulizer solution, Inhale 3 mLs (2.5 mg total) by nebulization every 6 (six) hours as needed for wheezing or shortness of breath., Disp: 125 mL, Rfl: 12 ?  albuterol (VENTOLIN HFA) 108 (90 Base) MCG/ACT inhaler, Inhale 2 puffs into the lungs every 4 (four) hours as needed., Disp: 24 g, Rfl: 3 ?  amoxicillin-clavulanate (AUGMENTIN) 875-125 MG tablet, Take 1 tablet by mouth 2 (two) times daily., Disp: 14 tablet, Rfl: 0 ?  cyclobenzaprine (FLEXERIL) 10 MG tablet, Take 1 tablet by mouth at bedtime as needed., Disp: 30 tablet, Rfl: 0 ?  EPINEPHRINE 0.3 mg/0.3 mL IJ SOAJ injection, INJECT 1 SYRINGE INTO THE MUSCLE AS NEEDED FOR ANAPHYLAXIS, Disp: 2 each, Rfl: 1 ?  famotidine (PEPCID) 20 MG tablet, One at bedtime (Patient taking differently: One at bedtime), Disp: 60  tablet, Rfl: 11 ?  fluticasone (FLONASE) 50 MCG/ACT nasal spray, USE 2 SPRAYS IN EACH NOSTRIL DAILY, Disp: 48 g, Rfl: 2 ?  hydrochlorothiazide (HYDRODIURIL) 25 MG tablet, Take 1 tablet (25 mg total) by mouth daily., Disp: 90 tablet, Rfl: 1 ?  HYDROcodone bit-homatropine (HYCODAN) 5-1.5 MG/5ML syrup, Take 5 mLs by mouth every 6 (six) hours as needed for cough., Disp: 120 mL, Rfl: 0 ?  ibuprofen (ADVIL) 200 MG tablet, Take 400 mg by mouth every 6 (six) hours as needed for moderate pain., Disp: , Rfl:  ?  mometasone-formoterol (DULERA) 100-5 MCG/ACT AERO, Take 2 puffs first thing in  the morning and then another 2 puffs about 12 hours later., Disp: 39 g, Rfl: 3 ?  pantoprazole (PROTONIX) 40 MG tablet, TAKE ONE TABLET BY MOUTH TWICE DAILY BEFORE MEALS. TAKE 30 TO 60 MINUTES BEFORE FIRST MEAL OF THE DAY, Disp: 60 tablet, Rfl: 0 ?  predniSONE (DELTASONE) 20 MG tablet, Take two tablets by mouth once daily for 6 days, Disp: 12 tablet, Rfl: 0 ?  traMADol (ULTRAM) 50 MG tablet, Take 1 tablet by mouth every 8 hours as needed for severe pain., Disp: 10 tablet, Rfl: 0 ?  valsartan (DIOVAN) 320 MG tablet, Take 0.5 tablets (160 mg total) by mouth daily., Disp: 45 tablet, Rfl: 1 ?  VITAMIN D PO, Take 1 capsule by mouth daily., Disp: , Rfl:  ? ?EXAM: ? ?VITALS per patient if applicable: ? ?GENERAL: alert, oriented, appears well and in no acute distress ? ?HEENT: atraumatic, conjunttiva clear, no obvious abnormalities on inspection of external nose and ears ? ?NECK: normal movements of the head and neck ? ?LUNGS: on inspection no signs of respiratory distress, breathing rate appears normal, no obvious gross SOB, gasping or wheezing ? ?CV: no obvious cyanosis ? ?MS: moves all visible extremities without noticeable abnormality ? ?PSYCH/NEURO: pleasant and cooperative, no obvious depression or anxiety, speech and thought processing grossly intact ? ?ASSESSMENT AND PLAN: ? ?Discussed the following assessment and plan: ? ?Diarrhea,  unspecified type ? ?Nausea ? ?Upset stomach ? ?-we discussed possible serious and likely etiologies, options for evaluation and workup, limitations of telemedicine visit vs in person visit, treatment, treatment risks and precautions. Pt is agreeable to treatment via telemedicine at this moment. Query side effects to recent abx, gastroenteritis (as this is currently going around in the community) vs other. Since diarrhea resolved and somewhat improved opted to try no dairy, zofran for nausea, can use imodium if any further diarrhea. Also, advised covid testing and prompt inperson follow up if worsening on not improving over the next 1-2 days.  ?Work/School slipped offered: provided in patient instructions   ?Advised to seek prompt  in person care if worsening, new symptoms arise, or if is not improving with treatment as expected per our conversation of expected course. Discussed options for follow  up care.  ?  ?I discussed the assessment and treatment plan with the patient. The patient was provided an opportunity to ask questions and all were answered. The patient agreed with the plan and demonstrated an understanding of the instructions. ?  ? ? ?Lucretia Kern, DO  ? ?

## 2022-02-14 ENCOUNTER — Encounter (HOSPITAL_BASED_OUTPATIENT_CLINIC_OR_DEPARTMENT_OTHER): Payer: Self-pay

## 2022-02-14 ENCOUNTER — Emergency Department (HOSPITAL_BASED_OUTPATIENT_CLINIC_OR_DEPARTMENT_OTHER): Payer: BC Managed Care – PPO

## 2022-02-14 ENCOUNTER — Other Ambulatory Visit: Payer: Self-pay

## 2022-02-14 ENCOUNTER — Emergency Department (HOSPITAL_BASED_OUTPATIENT_CLINIC_OR_DEPARTMENT_OTHER)
Admission: EM | Admit: 2022-02-14 | Discharge: 2022-02-14 | Disposition: A | Discharge status: Home / Self Care | Payer: BC Managed Care – PPO | Attending: Emergency Medicine | Admitting: Emergency Medicine

## 2022-02-14 DIAGNOSIS — R1013 Epigastric pain: Secondary | ICD-10-CM | POA: Insufficient documentation

## 2022-02-14 DIAGNOSIS — K2970 Gastritis, unspecified, without bleeding: Secondary | ICD-10-CM | POA: Diagnosis not present

## 2022-02-14 DIAGNOSIS — Z79899 Other long term (current) drug therapy: Secondary | ICD-10-CM | POA: Diagnosis not present

## 2022-02-14 DIAGNOSIS — Z91010 Allergy to peanuts: Secondary | ICD-10-CM | POA: Diagnosis not present

## 2022-02-14 DIAGNOSIS — R1110 Vomiting, unspecified: Secondary | ICD-10-CM | POA: Diagnosis not present

## 2022-02-14 DIAGNOSIS — R109 Unspecified abdominal pain: Secondary | ICD-10-CM | POA: Diagnosis not present

## 2022-02-14 DIAGNOSIS — N281 Cyst of kidney, acquired: Secondary | ICD-10-CM | POA: Diagnosis not present

## 2022-02-14 LAB — COMPREHENSIVE METABOLIC PANEL
ALT: 11 U/L (ref 0–44)
AST: 15 U/L (ref 15–41)
Albumin: 4.4 g/dL (ref 3.5–5.0)
Alkaline Phosphatase: 52 U/L (ref 38–126)
Anion gap: 7 (ref 5–15)
BUN: 15 mg/dL (ref 6–20)
CO2: 30 mmol/L (ref 22–32)
Calcium: 9.6 mg/dL (ref 8.9–10.3)
Chloride: 104 mmol/L (ref 98–111)
Creatinine, Ser: 0.93 mg/dL (ref 0.44–1.00)
GFR, Estimated: >60 mL/min (ref >60)
Glucose, Bld: 97 mg/dL (ref 70–99)
Potassium: 3.3 mmol/L — ABNORMAL LOW (ref 3.5–5.1)
Sodium: 141 mmol/L (ref 135–145)
Total Bilirubin: 0.4 mg/dL (ref 0.3–1.2)
Total Protein: 7.3 g/dL (ref 6.5–8.1)

## 2022-02-14 LAB — URINALYSIS, ROUTINE W REFLEX MICROSCOPIC
Bilirubin Urine: NEGATIVE
Glucose, UA: NEGATIVE mg/dL
Hgb urine dipstick: NEGATIVE
Ketones, ur: NEGATIVE mg/dL
Nitrite: NEGATIVE
Protein, ur: NEGATIVE mg/dL
Specific Gravity, Urine: 1.025 (ref 1.005–1.030)
pH: 7 (ref 5.0–8.0)

## 2022-02-14 LAB — CBC
HCT: 39.7 % (ref 36.0–46.0)
Hemoglobin: 12.8 g/dL (ref 12.0–15.0)
MCH: 27 pg (ref 26.0–34.0)
MCHC: 32.2 g/dL (ref 30.0–36.0)
MCV: 83.8 fL (ref 80.0–100.0)
Platelets: 339 10*3/uL (ref 150–400)
RBC: 4.74 MIL/uL (ref 3.87–5.11)
RDW: 14.7 % (ref 11.5–15.5)
WBC: 4.8 10*3/uL (ref 4.0–10.5)
nRBC: 0 % (ref 0.0–0.2)

## 2022-02-14 LAB — LIPASE, BLOOD: Lipase: 17 U/L (ref 11–51)

## 2022-02-14 MED ORDER — METOCLOPRAMIDE HCL 5 MG/ML IJ SOLN
10.0000 mg | Freq: Once | INTRAMUSCULAR | Status: DC
Start: 2022-02-14 — End: 2022-02-15
  Filled 2022-02-14: qty 2

## 2022-02-14 MED ORDER — SODIUM CHLORIDE 0.9 % IV BOLUS
1000.0000 mL | Freq: Once | INTRAVENOUS | Status: AC
Start: 2022-02-14 — End: 2022-02-14
  Administered 2022-02-14: 1000 mL via INTRAVENOUS

## 2022-02-14 MED ORDER — IOHEXOL 300 MG/ML  SOLN
100.0000 mL | Freq: Once | INTRAMUSCULAR | Status: AC | PRN
  Administered 2022-02-14: 75 mL via INTRAVENOUS

## 2022-02-14 MED ORDER — HYDROCODONE-ACETAMINOPHEN 5-325 MG PO TABS: 1.0000 | ORAL_TABLET | Freq: Four times a day (QID) | ORAL | 0 refills | Status: DC | PRN

## 2022-02-14 MED ORDER — DIPHENHYDRAMINE HCL 50 MG/ML IJ SOLN
12.5000 mg | Freq: Once | INTRAMUSCULAR | Status: DC
Start: 2022-02-14 — End: 2022-02-15
  Filled 2022-02-14: qty 1

## 2022-02-14 MED ORDER — SUCRALFATE 1 G PO TABS: 1.0000 g | ORAL_TABLET | Freq: Three times a day (TID) | ORAL | 0 refills | Status: DC

## 2022-02-14 MED ORDER — MORPHINE SULFATE (PF) 4 MG/ML IV SOLN
4.0000 mg | Freq: Once | INTRAVENOUS | Status: AC
  Administered 2022-02-14: 4 mg via INTRAVENOUS
  Filled 2022-02-14: qty 1

## 2022-02-14 NOTE — Discharge Instructions (Signed)
You likely have gastritis. ? ?Please continue taking your Nexium ? ?I have prescribed Carafate to help with your gastritis ? ?Stay hydrated. ? ?You can take Vicodin if you have severe pain ? ? ?Please call your GI doctor for endoscopy ? ? ?Return to ER if you have severe abdominal pain, vomiting, fever ?

## 2022-02-14 NOTE — ED Notes (Signed)
Pt stated she is currently unable to provide urine sample. Instructed pt to press call button when she is able to.  ?

## 2022-02-14 NOTE — ED Triage Notes (Signed)
Pt reports abdomen pain x3 weeks. States it feels like a "boulder" in her stomach. Reports nausea. PCP gave her zofran but has had little relief. Middle abdomen pain. Repots fatigue.  ? ?Pain worse after she eats something. Pt report she has had gallbladder surgery before.  ?

## 2022-02-14 NOTE — ED Provider Notes (Signed)
?[facility] ?Provider Note ? ? ?CSN: [id number] ?Arrival date & time: 02/14/22  1553 ? ?  ? ?History ? ?Chief Complaint  ?Patient presents with  ? Abdominal Pain  ? ? ?[name] is a 56 y.o. female here with abdominal pain.  Patient has some abdominal pain for the last week or so.  Patient states that she has some loose stools as well.  Patient recently finished a course of Z-Pak for possible atypical pneumonia and finished a course of amoxicillin as well.  Patient states that her abdomen has become more distended and she has been vomiting despite taking Zofran.  Had previous cholecystectomy as well ? ?The history is provided by the patient.  ? ?  ? ?Home Medications ?Prior to Admission medications   ?Medication Sig Start Date End Date Taking? Authorizing Provider  ?albuterol (PROVENTIL) (2.5 MG/3ML) 0.083% nebulizer solution Inhale 3 mLs (2.5 mg total) by nebulization every 6 (six) hours as needed for wheezing or shortness of breath. 06/01/21   [name], MD  ?albuterol (VENTOLIN HFA) 108 (90 Base) MCG/ACT inhaler Inhale 2 puffs into the lungs every 4 (four) hours as needed. 07/17/21   [name], MD  ?amoxicillin-clavulanate (AUGMENTIN) 875-125 MG tablet Take 1 tablet by mouth 2 (two) times daily. 02/06/22   Burchette, Alinda Sierras, MD  ?cyclobenzaprine (FLEXERIL) 10 MG tablet Take 1 tablet by mouth at bedtime as needed. 09/05/21   Meredith Pel, MD  ?EPINEPHRINE 0.3 mg/0.3 mL IJ SOAJ injection INJECT 1 SYRINGE INTO THE MUSCLE AS NEEDED FOR ANAPHYLAXIS 08/19/19   [name], [name], MD  ?famotidine (PEPCID) 20 MG tablet One at bedtime ?Patient taking differently: One at bedtime 11/11/18   [name], MD  ?fluticasone [facility]) 50 MCG/ACT nasal spray USE 2 SPRAYS IN [facility] NOSTRIL DAILY 04/13/21   [name], [name], MD  ?hydrochlorothiazide (HYDRODIURIL) 25 MG tablet Take 1 tablet (25 mg total) by mouth daily. 02/07/22   [name], [name], MD  ?HYDROcodone bit-homatropine (HYCODAN)  5-1.5 MG/5ML syrup Take 5 mLs by mouth every 6 (six) hours as needed for cough. 02/06/22   Burchette, Alinda Sierras, MD  ?ibuprofen (ADVIL) 200 MG tablet Take 400 mg by mouth every 6 (six) hours as needed for moderate pain.    [provider]  ?mometasone-formoterol (DULERA) 100-5 MCG/ACT AERO Take 2 puffs first thing in  the morning and then another 2 puffs about 12 hours later. 07/17/21   [name], MD  ?ondansetron (ZOFRAN) 4 MG tablet Take 1 tablet (4 mg total) by mouth every 8 (eight) hours as needed for nausea or vomiting. 02/11/22   Lucretia Kern, DO  ?pantoprazole (PROTONIX) 40 MG tablet TAKE ONE TABLET BY MOUTH TWICE DAILY BEFORE MEALS. TAKE 30 TO 60 MINUTES BEFORE FIRST MEAL OF THE DAY 06/26/20   [name], MD  ?predniSONE (DELTASONE) 20 MG tablet Take two tablets by mouth once daily for 6 days 01/30/22   Eulas Post, MD  ?traMADol (ULTRAM) 50 MG tablet Take 1 tablet by mouth every 8 hours as needed for severe pain. 07/28/21   Gregor Hams, MD  ?valsartan (DIOVAN) 320 MG tablet Take 0.5 tablets (160 mg total) by mouth daily. 02/07/22   [name], [name], MD  ?VITAMIN D PO Take 1 capsule by mouth daily.    [provider]  ?   ? ?Allergies    ?Allegra [fexofenadine]; Cimetidine; Diovan hct [valsartan-hydrochlorothiazide]; Purified water [water, sterile]; Chocolate; Citric acid;  Coly-mycin s; Peanut-containing drug products; Valsartan; and Amlodipine   ? ?Review of Systems   ?Review of Systems  ?Gastrointestinal:  Positive for abdominal pain and vomiting.  ?All other systems reviewed and are negative. ? ?Physical Exam ?Updated Vital Signs ?BP (!) 159/109   Pulse 60   Temp 98.4 ?F (36.9 ?C) (Oral)   Resp (!) 22   SpO2 98%  ?Physical Exam ?Vitals and nursing note reviewed.  ?Constitutional:   ?   Comments: Uncomfortable  ?HENT:  ?   Head: Normocephalic.  ?   Mouth/Throat:  ?   Pharynx: Oropharynx is clear.  ?Eyes:  ?   Extraocular Movements: Extraocular movements intact.  ?   Pupils:  Pupils are equal, round, and reactive to light.  ?Cardiovascular:  ?   Rate and Rhythm: Normal rate and regular rhythm.  ?   Heart sounds: Normal heart sounds.  ?Pulmonary:  ?   Effort: Pulmonary effort is normal.  ?   Breath sounds: Normal breath sounds.  ?Abdominal:  ?   Comments: Mild diffuse tenderness worse in the epigastric area  ?Skin: ?   General: Skin is warm.  ?Neurological:  ?   General: No focal deficit present.  ?   Mental Status: She is alert and oriented to person, place, and time.  ?Psychiatric:     ?   Behavior: Behavior normal.  ? ? ?ED Results / Procedures / Treatments   ?Labs ?(all labs ordered are listed, but only abnormal results are displayed) ?Labs Reviewed  ?COMPREHENSIVE METABOLIC PANEL - Abnormal; Notable for the following components:  ?    Result Value  ? Potassium 3.3 (*)   ? All other components within normal limits  ?URINALYSIS, ROUTINE W REFLEX MICROSCOPIC - Abnormal; Notable for the following components:  ? Leukocytes,Ua LARGE (*)   ? Non Squamous Epithelial 0-5 (*)   ? All other components within normal limits  ?LIPASE, BLOOD  ?CBC  ? ? ?EKG ?None ? ?Radiology ?CT ABDOMEN PELVIS W CONTRAST ? ?Result Date: 02/14/2022 ?CLINICAL DATA:  Abdominal pain for 3 weeks. EXAM: CT ABDOMEN AND PELVIS WITH CONTRAST TECHNIQUE: Multidetector CT imaging of the abdomen and pelvis was performed using the standard protocol following bolus administration of intravenous contrast. RADIATION DOSE REDUCTION: This exam was performed according to the departmental dose-optimization program which includes automated exposure control, adjustment of the mA and/or kV according to patient size and/or use of iterative reconstruction technique. CONTRAST:  33m OMNIPAQUE IOHEXOL 300 MG/ML  SOLN COMPARISON:  None. FINDINGS: Lower chest: No acute abnormality. Right basilar dependent atelectasis. Hepatobiliary: No focal liver abnormality is seen. Status post cholecystectomy. No biliary dilatation. Pancreas: Unremarkable.  No pancreatic ductal dilatation or surrounding inflammatory changes. Spleen: Normal in size without focal abnormality. Adrenals/Urinary Tract: Adrenal glands are unremarkable. Simple cyst in the upper pole of the left kidney measuring up to 2.2 cm. No follow-up is recommended. No evidence of nephrolithiasis or hydronephrosis. Bladder is unremarkable. Stomach/Bowel: Stomach is within normal limits. Appendix appears normal. No evidence of bowel wall thickening, distention, or inflammatory changes. Vascular/Lymphatic: No significant vascular findings are present. No enlarged abdominal or pelvic lymph nodes. Reproductive: Status post hysterectomy. No adnexal masses. Other: No abdominal wall hernia or abnormality. No abdominopelvic ascites. Musculoskeletal: No acute or significant osseous findings. IMPRESSION: 1.  Normal appendix.  No evidence of colitis or diverticulitis. 2. Stomach is underdistended with mild wall thickening, which may be secondary to gastritis or underdistention. 3.  No evidence of nephrolithiasis or hydronephrosis. 4.  Status post cholecystectomy and hysterectomy. Electronically Signed   By: Keane Police D.O.   On: 02/14/2022 18:50   ? ?Procedures ?Procedures  ? ? ?Medications Ordered in ED ?Medications  ?metoCLOPramide (REGLAN) injection 10 mg (10 mg Intravenous Patient Refused/Not Given 02/14/22 1759)  ?diphenhydrAMINE (BENADRYL) injection 12.5 mg (12.5 mg Intravenous Patient Refused/Not Given 02/14/22 1759)  ?sodium chloride 0.9 % bolus 1,000 mL (0 mLs Intravenous Stopped 02/14/22 1857)  ?morphine (PF) 4 MG/ML injection 4 mg (4 mg Intravenous Given 02/14/22 1801)  ?iohexol (OMNIPAQUE) 300 MG/ML solution 100 mL (75 mLs Intravenous Contrast Given 02/14/22 1819)  ? ? ?ED Course/ Medical Decision Making/ A&P ?  ?                        ?Medical Decision Making ?Mayelin R Fiala is a 56 y.o. female here presenting with vomiting and abdominal pain.  Patient had previous cholecystectomy. Consider viral  gastroenteritis versus SBO.  We will get CBC and CMP and lipase and CT abdomen pelvis and UA ? ?7:50 PM ?UA showed some leuks but no obvious bacteria.  Given that she has diarrhea, I think this is likely contamina

## 2022-02-26 ENCOUNTER — Encounter: Payer: Self-pay | Admitting: Physician Assistant

## 2022-02-26 ENCOUNTER — Ambulatory Visit (INDEPENDENT_AMBULATORY_CARE_PROVIDER_SITE_OTHER): Payer: BC Managed Care – PPO | Admitting: Physician Assistant

## 2022-02-26 ENCOUNTER — Other Ambulatory Visit: Payer: Self-pay | Admitting: Physician Assistant

## 2022-02-26 VITALS — BP 122/84 | HR 62 | Ht 60.0 in | Wt 148.6 lb

## 2022-02-26 DIAGNOSIS — R1013 Epigastric pain: Secondary | ICD-10-CM | POA: Diagnosis not present

## 2022-02-26 DIAGNOSIS — R1084 Generalized abdominal pain: Secondary | ICD-10-CM | POA: Diagnosis not present

## 2022-02-26 DIAGNOSIS — R110 Nausea: Secondary | ICD-10-CM | POA: Diagnosis not present

## 2022-02-26 MED ORDER — SUCRALFATE 1 G PO TABS: 1.0000 g | ORAL_TABLET | Freq: Three times a day (TID) | ORAL | 2 refills | Status: DC

## 2022-02-26 MED ORDER — ESOMEPRAZOLE MAGNESIUM 40 MG PO CPDR: 40.0000 mg | DELAYED_RELEASE_CAPSULE | Freq: Two times a day (BID) | ORAL | 5 refills | Status: DC

## 2022-02-26 MED ORDER — GI COCKTAIL ~~LOC~~: ORAL | 2 refills | Status: DC

## 2022-02-26 NOTE — Progress Notes (Signed)
? ?Chief Complaint: Follow-up ER visit for abdominal pain and vomiting ? ?HPI: ?   [name] is a 56 year old African-American female, known to Dr. Fuller Plan, with past medical history as listed below including reflux, who presents to clinic today for follow-up after being seen in the ER for abdominal pain and vomiting. ?   02/26/2018 colonoscopy with one 6 mm polyp in the transverse colon, one 10 mm polyp in the sigmoid colon and 2 small angiodysplastic lesions in the cecum as well as internal hemorrhoids.  Pathology showed tubular adenoma and repeat was recommended in 3 years. ?   06/22/2019 EGD with empiric dilation, small hiatal hernia.  Otherwise normal.   ?   02/14/2022 patient seen in the ER for abdominal pain that had been over the past week with some loose stools.  Apparently had recently finished a course of Z-Pak for possible atypical pneumonia and finished a course of Amoxicillin as well.  Describes some vomiting.  At that time labs with a potassium of 3.3 and urinalysis with a large amount of leukocytes, normal lipase and CBC.  CT the abdomen pelvis with contrast showed no evidence of colitis or diverticulitis, the stomach was under distended with mild wall thickening which may be secondary to gastritis or underdistention, status postcholecystectomy and hysterectomy.  That time they are considering viral gastroenteritis.  Patient improved after Reglan.  Carafate was added to her PPI. ?   Today, the patient tells me that she has continued with abdominal pain and nausea.  Describes that it was a 9/10 when she went to the ER and it has maybe decreased slightly to a 7/10 with the addition of Carafate 1 g which she finds time for about twice a day.  She continues her Nexium 40 mg once daily.  She does tell me that it feels like "a rock in my stomach", anytime she eats anything it makes it feel worse about 20 to 60 minutes after eating.  Some things such as boiled eggs, coffee or soda make it extremely worse to  the point where she is bent over in excruciating pain.  Associated symptoms include nausea and only one episode of vomiting.  Does tell me her bowel habits have gone back to completely normal. ?   Denies fever, chills, weight loss, blood in her stool or symptoms that awaken her from sleep. ? ?Past Medical History:  ?Diagnosis Date  ? Allergic reaction caused by a drug 01/25/2013  ? probably.   ? Allergy   ? Anemia   ? Asthma   ? GERD (gastroesophageal reflux disease)   ? Heart murmur   ? Hypertension   ? Positive TB test   ? Shingles   ? TOBACCO USE 03/22/2009  ? Qualifier: Diagnosis of  By: Regis Bill MD, [name]  Stopped Dec 12 2010    ? ? ?Past Surgical History:  ?Procedure Laterality Date  ? ABDOMINAL HYSTERECTOMY    ? BREAST SURGERY    ? cyst from left breast  ? BUNIONECTOMY    ? both feet  ? CHOLECYSTECTOMY    ? CYSTECTOMY    ? left shoulder  ? KNEE SURGERY Left   ? PANENDOSCOPY    ? TONSILLECTOMY    ? TUBAL LIGATION    ? uterine ablastion    ? and polyps removed  ? Uterine polyps    ? ? ?Current Outpatient Medications  ?Medication Sig Dispense Refill  ? albuterol (PROVENTIL) (2.5 MG/3ML) 0.083% nebulizer solution Inhale 3 mLs (2.5  mg total) by nebulization every 6 (six) hours as needed for wheezing or shortness of breath. 125 mL 12  ? albuterol (VENTOLIN HFA) 108 (90 Base) MCG/ACT inhaler Inhale 2 puffs into the lungs every 4 (four) hours as needed. 24 g 3  ? cyclobenzaprine (FLEXERIL) 10 MG tablet Take 1 tablet by mouth at bedtime as needed. 30 tablet 0  ? EPINEPHRINE 0.3 mg/0.3 mL IJ SOAJ injection INJECT 1 SYRINGE INTO THE MUSCLE AS NEEDED FOR ANAPHYLAXIS 2 each 1  ? fluticasone (FLONASE) 50 MCG/ACT nasal spray USE 2 SPRAYS IN EACH NOSTRIL DAILY 48 g 2  ? hydrochlorothiazide (HYDRODIURIL) 25 MG tablet Take 1 tablet (25 mg total) by mouth daily. 90 tablet 1  ? HYDROcodone bit-homatropine (HYCODAN) 5-1.5 MG/5ML syrup Take 5 mLs by mouth every 6 (six) hours as needed for cough. 120 mL 0  ? HYDROcodone-acetaminophen  (NORCO/VICODIN) 5-325 MG tablet Take 1 tablet by mouth every 6 (six) hours as needed. 8 tablet 0  ? ibuprofen (ADVIL) 200 MG tablet Take 400 mg by mouth every 6 (six) hours as needed for moderate pain.    ? mometasone-formoterol (DULERA) 100-5 MCG/ACT AERO Take 2 puffs first thing in  the morning and then another 2 puffs about 12 hours later. 39 g 3  ? pantoprazole (PROTONIX) 40 MG tablet TAKE ONE TABLET BY MOUTH TWICE DAILY BEFORE MEALS. TAKE 30 TO 60 MINUTES BEFORE FIRST MEAL OF THE DAY 60 tablet 0  ? sucralfate (CARAFATE) 1 g tablet Take 1 tablet (1 g total) by mouth 4 (four) times daily -  with meals and at bedtime. 60 tablet 0  ? traMADol (ULTRAM) 50 MG tablet Take 1 tablet by mouth every 8 hours as needed for severe pain. 10 tablet 0  ? valsartan (DIOVAN) 320 MG tablet Take 0.5 tablets (160 mg total) by mouth daily. 45 tablet 1  ? VITAMIN D PO Take 1 capsule by mouth daily.    ? ?No current facility-administered medications for this visit.  ? ? ?Allergies as of 02/26/2022 - Review Complete 02/26/2022  ?Allergen Reaction Noted  ? Allegra [fexofenadine] Hives   ? Cimetidine Hives 11/14/2006  ? Diovan hct [valsartan-hydrochlorothiazide] Swelling 12/25/2012  ? Purified water [water, sterile] Anaphylaxis 12/22/2012  ? Chocolate Other (See Comments) 12/22/2012  ? Citric acid Other (See Comments)   ? Coly-mycin s Other (See Comments) 11/14/2006  ? Peanut-containing drug products Swelling 12/22/2012  ? Valsartan Swelling 02/11/2020  ? Amlodipine Other (See Comments) 03/03/2013  ? ? ?Family History  ?Problem Relation Age of Onset  ? Hypertension Mother   ? Alzheimer's disease Mother   ? Hypertension Father   ? Prostate cancer Father   ? HIV Brother   ? Lupus Sister   ? Colon cancer Paternal Grandmother   ? Esophageal cancer Neg Hx   ? Liver cancer Neg Hx   ? Pancreatic cancer Neg Hx   ? Rectal cancer Neg Hx   ? Stomach cancer Neg Hx   ? ? ?Social History  ? ?Socioeconomic History  ? Marital status: Divorced  ?  Spouse  name: Not on file  ? Number of children: Not on file  ? Years of education: Not on file  ? Highest education level: Not on file  ?Occupational History  ? Not on file  ?Tobacco Use  ? Smoking status: Former  ?  Packs/day: 0.50  ?  Years: 17.00  ?  Pack years: 8.50  ?  Types: Cigarettes  ?  Quit date: 12/12/2010  ?  Years since quitting: 11.2  ? Smokeless tobacco: Never  ?Vaping Use  ? Vaping Use: Never used  ?Substance and Sexual Activity  ? Alcohol use: No  ? Drug use: No  ? Sexual activity: Not on file  ?Other Topics Concern  ? Not on file  ?Social History Narrative  ? Divorced   ? Tobacco since age 66 stopped feb 2012  ? 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 Now works [employer]   ? ?Social Determinants of Health  ? ?Financial Resource Strain: Not on file  ?Food Insecurity: Not on file  ?Transportation Needs: Not on file  ?Physical Activity: Not on file  ?Stress: Not on file  ?Social Connections: Not on file  ?Intimate Partner Violence: Not on file  ? ? ?Review of Systems:    ?Constitutional: No weight loss, fever or chills ?Cardiovascular: No chest pain ?Respiratory: No SOB  ?Gastrointestinal: See HPI and otherwise negative ? ? Physical Exam:  ?Vital signs: ?BP 122/84   Pulse 62   Ht 5' (1.524 m)   Wt 148 lb 9.6 oz (67.4 kg)   SpO2 98%   BMI 29.02 kg/m?   ? ?Constitutional:   Pleasant AA female appears to be in NAD, Well developed, Well nourished, alert and cooperative ?Respiratory: Respirations even and unlabored. Lungs clear to auscultation bilaterally.   No wheezes, crackles, or rhonchi.  ?Cardiovascular: Normal S1, S2. No MRG. Regular rate and rhythm. No peripheral edema, cyanosis or pallor.  ?Gastrointestinal:  Soft, nondistended, moderate/marked epigastric and really generalized abdominal TTP with involuntary guarding. Normal bowel sounds. No appreciable masses or hepatomegaly. ?Rectal:  Not performed.  ?Psychiatric: Demonstrates good judgement and reason without abnormal  affect or behaviors. ? ?RELEVANT LABS AND IMAGING: ?CBC ?   ?Component Value Date/Time  ? WBC 4.8 02/14/2022 1626  ? RBC 4.74 02/14/2022 1626  ? HGB 12.8 02/14/2022 1626  ? HCT 39.7 02/14/2022 1626  ? PLT

## 2022-02-26 NOTE — Addendum Note (Signed)
Addended by: Cardell Peach I on: 02/26/2022 03:58 PM ? ? Modules accepted: Orders ? ?

## 2022-02-26 NOTE — Patient Instructions (Signed)
You have been scheduled for a EGD. Please follow the written instructions given to you at your visit today. ?If you use inhalers (even only as needed), please bring them with you on the day of your procedure. ? ?We have refilled the Carafate. ?We sent in a prescription for GI Cocktail, use 5-10 mL every 4-6 hours as needed. ?We sent in a new prescription for Nexium 40 MG, take 1 twice a day. Insurance may not cover this, you can purchase over the counter. ? ?Thank you for trusting me with your gastrointestinal care!   ? ?Ellouise Newer, PA ? ? ? ? ?BMI: ? ?If you are age 22 or older, your body mass index should be between 23-30. Your Body mass index is 29.02 kg/m?Marland Kitchen If this is out of the aforementioned range listed, please consider follow up with your Primary Care Provider. ? ?If you are age 45 or younger, your body mass index should be between 19-25. Your Body mass index is 29.02 kg/m?Marland Kitchen If this is out of the aformentioned range listed, please consider follow up with your Primary Care Provider.  ? ?MY CHART: ? ?The [facility] providers would like to encourage you to use [facility] to communicate with providers for non-urgent requests or questions.  Due to long hold times on the telephone, sending your provider a message by [facility] may be a faster and more efficient way to get a response.  Please allow 48 business hours for a response.  Please remember that this is for non-urgent requests.  ? ?

## 2022-02-28 ENCOUNTER — Telehealth: Payer: Self-pay | Admitting: Physician Assistant

## 2022-02-28 MED ORDER — AMBULATORY NON FORMULARY MEDICATION: 1 refills | Status: DC

## 2022-02-28 NOTE — Telephone Encounter (Signed)
Script corrected and faxed to pharmacy. ?

## 2022-02-28 NOTE — Telephone Encounter (Signed)
Please advise if I ordered the correct one. ?

## 2022-02-28 NOTE — Telephone Encounter (Signed)
Patient called, states the pharmacy needs clarification on the GI cocktail sent. Please advise.  ?

## 2022-03-05 ENCOUNTER — Encounter: Payer: Self-pay | Admitting: Internal Medicine

## 2022-03-05 ENCOUNTER — Ambulatory Visit (INDEPENDENT_AMBULATORY_CARE_PROVIDER_SITE_OTHER): Payer: BC Managed Care – PPO | Admitting: Internal Medicine

## 2022-03-05 ENCOUNTER — Ambulatory Visit (INDEPENDENT_AMBULATORY_CARE_PROVIDER_SITE_OTHER): Payer: BC Managed Care – PPO

## 2022-03-05 VITALS — BP 124/78 | HR 75 | Temp 98.8°F | Ht 60.0 in | Wt 146.6 lb

## 2022-03-05 DIAGNOSIS — R9389 Abnormal findings on diagnostic imaging of other specified body structures: Secondary | ICD-10-CM

## 2022-03-05 DIAGNOSIS — R1013 Epigastric pain: Secondary | ICD-10-CM

## 2022-03-05 DIAGNOSIS — J9811 Atelectasis: Secondary | ICD-10-CM | POA: Diagnosis not present

## 2022-03-05 DIAGNOSIS — R059 Cough, unspecified: Secondary | ICD-10-CM | POA: Diagnosis not present

## 2022-03-05 NOTE — Patient Instructions (Signed)
Good to see you today .  ? ?Will work on forms. ? ?X ray  to check the lungs . ? ?Proceed with   Gi evaluation .  Still seems like side effect from the antibiotics or illness but agree  with evaluation since not getting better. ? ? ? ? ?

## 2022-03-05 NOTE — Progress Notes (Signed)
? ?Chief Complaint  ?Patient presents with  ? Follow-up  ? ? ?HPI: ?[name] 56 y.o. come in for fu of visits   relating to recent   abd pain  and visits  ? ?List of visits  ?3/27 sore throat  Dr B ?3/29 cough  Dr Levell July pred ?4/5 Cough resp  infection 4 dr B augmentin  cough med  ?4/10 Diarrhea  4 10 d ?4/13 in ed for gastritis   potassium still low 3.2 ?4/25 Gi [facility]   see abd pain upper and planned   egd  plaed on carafate and in nexium  and due for colon  ? ?Has appt dr Fuller Plan 5 8 endoscopy ? ?Basically she had a respiratory infection that only required a few nebulizer treatments but with significant that required antibiotics x2 and 3 days after the last antibiotic developed severe upper mid left abdominal pain diarrhea for 2 days vomited 1 time and since that time has had persistent mid to upper and left abdominal pain that is worse about 20 to 30 minutes after eating.  Sometimes it is severe.  She has had to leave work for that.  She went back to work from her respiratory infection and was absent 3 24-4 18 on Wednesday, April 26 she left about 3 AM because of abdominal pain and was out on the 27th also.  Her work schedule is usually Sunday through Thursday 11 PM to 7 AM. ? ?The GI cocktail does help for about 1/2-hour or so it includes lidocaine she is on Carafate and double dose Nexium. ?If she eats any kind of food it causes the pain although she is not able to tolerate water has not tried Gatorade.  She describes the pain after eating is painful dancing monkeys in her abdomen. ? ?Asthma :  routine fu dr Melvyn Novas  august 4  ?ROS: See pertinent positives and negatives per HPI.  No fever.  She reviewed the CT scan concerned about some finding in the right lower lung area reported as atelectasis on the finding. ? ?Past Medical History:  ?Diagnosis Date  ? Allergic reaction caused by a drug 01/25/2013  ? probably.   ? Allergy   ? Anemia   ? Asthma   ? GERD (gastroesophageal reflux disease)   ? Heart  murmur   ? Hypertension   ? Positive TB test   ? Shingles   ? TOBACCO USE 03/22/2009  ? Qualifier: Diagnosis of  By: Regis Bill MD, [name]  Stopped Dec 12 2010    ? ? ?Family History  ?Problem Relation Age of Onset  ? Hypertension Mother   ? Alzheimer's disease Mother   ? Hypertension Father   ? Prostate cancer Father   ? HIV Brother   ? Lupus Sister   ? Colon cancer Paternal Grandmother   ? Esophageal cancer Neg Hx   ? Liver cancer Neg Hx   ? Pancreatic cancer Neg Hx   ? Rectal cancer Neg Hx   ? Stomach cancer Neg Hx   ? ? ?Social History  ? ?Socioeconomic History  ? Marital status: Divorced  ?  Spouse name: Not on file  ? Number of children: Not on file  ? Years of education: Not on file  ? Highest education level: Not on file  ?Occupational History  ? Not on file  ?Tobacco Use  ? Smoking status: Former  ?  Packs/day: 0.50  ?  Years: 17.00  ?  Pack  years: 8.50  ?  Types: Cigarettes  ?  Quit date: 12/12/2010  ?  Years since quitting: 11.2  ? Smokeless tobacco: Never  ?Vaping Use  ? Vaping Use: Never used  ?Substance and Sexual Activity  ? Alcohol use: No  ? Drug use: No  ? Sexual activity: Not on file  ?Other Topics Concern  ? Not on file  ?Social History Narrative  ? Divorced   ? Tobacco since age 11 stopped feb 2012  ? 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 Now works [employer]   ? ?Social Determinants of Health  ? ?Financial Resource Strain: Not on file  ?Food Insecurity: Not on file  ?Transportation Needs: Not on file  ?Physical Activity: Not on file  ?Stress: Not on file  ?Social Connections: Not on file  ? ? ?Outpatient Medications Prior to Visit  ?Medication Sig Dispense Refill  ? albuterol (PROVENTIL) (2.5 MG/3ML) 0.083% nebulizer solution Inhale 3 mLs (2.5 mg total) by nebulization every 6 (six) hours as needed for wheezing or shortness of breath. 125 mL 12  ? albuterol (VENTOLIN HFA) 108 (90 Base) MCG/ACT inhaler Inhale 2 puffs into the lungs every 4 (four) hours as needed. 24 g 3   ? Alum & Mag Hydroxide-Simeth (GI COCKTAIL) SUSP suspension Shake well. Take 5-10 mL every 4-6 hours as needed. 30 mL 2  ? AMBULATORY NON FORMULARY MEDICATION Medication Name: GI Cocktail - 90 ml Lidocaine 2%:90 ml Dicyclomine '10mg'$ /102m:270 ml Maalox - Take 5-10 ml every 4-6 hours as needed. 450 mL 1  ? cyclobenzaprine (FLEXERIL) 10 MG tablet Take 1 tablet by mouth at bedtime as needed. 30 tablet 0  ? EPINEPHRINE 0.3 mg/0.3 mL IJ SOAJ injection INJECT 1 SYRINGE INTO THE MUSCLE AS NEEDED FOR ANAPHYLAXIS 2 each 1  ? fluticasone (FLONASE) 50 MCG/ACT nasal spray USE 2 SPRAYS IN EACH NOSTRIL DAILY 48 g 2  ? hydrochlorothiazide (HYDRODIURIL) 25 MG tablet Take 1 tablet (25 mg total) by mouth daily. 90 tablet 1  ? HYDROcodone bit-homatropine (HYCODAN) 5-1.5 MG/5ML syrup Take 5 mLs by mouth every 6 (six) hours as needed for cough. 120 mL 0  ? HYDROcodone-acetaminophen (NORCO/VICODIN) 5-325 MG tablet Take 1 tablet by mouth every 6 (six) hours as needed. 8 tablet 0  ? ibuprofen (ADVIL) 200 MG tablet Take 400 mg by mouth every 6 (six) hours as needed for moderate pain.    ? mometasone-formoterol (DULERA) 100-5 MCG/ACT AERO Take 2 puffs first thing in  the morning and then another 2 puffs about 12 hours later. 39 g 3  ? omeprazole (PRILOSEC) 40 MG capsule Take 1 capsule (40 mg total) by mouth 2 (two) times daily before a meal. 60 capsule 5  ? sucralfate (CARAFATE) 1 g tablet Take 1 tablet (1 g total) by mouth 4 (four) times daily -  with meals and at bedtime. 180 tablet 2  ? valsartan (DIOVAN) 320 MG tablet Take 0.5 tablets (160 mg total) by mouth daily. 45 tablet 1  ? VITAMIN D PO Take 1 capsule by mouth daily.    ? ?No facility-administered medications prior to visit.  ? ? ? ?EXAM: ? ?BP 124/78 (BP Location: Left Arm, Patient Position: Sitting, Cuff Size: Normal)   Pulse 75   Temp 98.8 ?F (37.1 ?C) (Oral)   Ht 5' (1.524 m)   Wt 146 lb 9.6 oz (66.5 kg)   SpO2 95%   BMI 28.63 kg/m?  ? ?Body mass index is 28.63  kg/m?.Marland Kitchen? ?  GENERAL: vitals reviewed and listed above, alert, oriented, appears well hydrated and in no acute distress ?HEENT: atraumatic, conjunctiva  clear, no obvious abnormalities on inspection of external nose and ears OP : Masked ?NECK: no obvious masses on inspection palpation  ?LUNGS: clear to auscultation bilaterally, no wheezes, rales or rhonchi, good air movement ?CV: HRRR, no clubbing cyanosis or  peripheral edema nl cap refill  ?Abdomen bowel sounds are present she is very tender middle epigastrium and left upper quadrant without obvious masses.  Abdomen is not tympanitic uncomfortable.  No fluid waves ?MS: moves all extremities without noticeable focal  abnormality ?PSYCH: pleasant and cooperative, no obvious depression or anxiety ?Lab Results  ?Component Value Date  ? WBC 4.8 02/14/2022  ? HGB 12.8 02/14/2022  ? HCT 39.7 02/14/2022  ? PLT 339 02/14/2022  ? GLUCOSE 97 02/14/2022  ? CHOL 195 05/19/2018  ? TRIG 41.0 05/19/2018  ? HDL 83.80 05/19/2018  ? LDLCALC 103 (H) 05/19/2018  ? ALT 11 02/14/2022  ? AST 15 02/14/2022  ? NA 141 02/14/2022  ? K 3.3 (L) 02/14/2022  ? CL 104 02/14/2022  ? CREATININE 0.93 02/14/2022  ? BUN 15 02/14/2022  ? CO2 30 02/14/2022  ? TSH 0.97 05/18/2021  ? INR 1.0 01/17/2021  ? MICROALBUR 0.7 05/30/2009  ? ?BP Readings from Last 3 Encounters:  ?03/05/22 124/78  ?02/26/22 122/84  ?02/14/22 (!) 155/96  ? ? ?ASSESSMENT AND PLAN: ? ?Discussed the following assessment and plan: ? ?Epigastric abdominal pain post prandial - aggravated  post eating solids   water ok  ongoing wo improvement ? ?Abnormal finding on CT scan - atelecteisi on ct scan  patient concern  beause of resp history  - Plan: DG Chest 2 View ?Symptoms appear to be gone after 2 courses of antibiotics the second 1 Augmentin.  However is not mitigating with normal measures and time.  She has not had this problem before no history of ulcers.  His postprandial sounds like spasm no evidence of pancreatitis but does radiate to  the back.  Reassured there is no signs of obstruction so far and to proceed with her endoscopy. ?The atelectasis on the CT scan may be insignificant we will go ahead and get a plain chest x-ray today to be reassured. ?

## 2022-03-05 NOTE — Progress Notes (Signed)
So x-ray does not show anything new but does show the area on the right could be atelectasis or pneumonia but based on your history I do not think you have pneumonia that is active.  It is more likely the atelectasis as a cause no other significant abnormality. There is some old scarring on the left.  Not take any different action at this time

## 2022-03-06 ENCOUNTER — Telehealth: Payer: Self-pay

## 2022-03-06 NOTE — Telephone Encounter (Signed)
-----   Message from Eulas Post, MD sent at 03/05/2022  9:08 PM EDT ----- ?Form completed.   ?----- Message ----- ?From: Burnis Medin, MD ?Sent: 03/05/2022   3:54 PM EDT ?To: Eulas Post, MD ? ?Juluis Rainier  there is a form from her work sent to you   but given to me for absences related to your visits with her  I wrote  dates in the note to document.  ? ?

## 2022-03-06 NOTE — Telephone Encounter (Signed)
I spoke with the pt and informed her that forms have been completed and she requested to pickup forms from our office. Forms placed in front office for pickup.  ?

## 2022-03-11 ENCOUNTER — Encounter: Payer: Self-pay | Admitting: Gastroenterology

## 2022-03-11 ENCOUNTER — Ambulatory Visit (AMBULATORY_SURGERY_CENTER): Payer: BC Managed Care – PPO | Admitting: Gastroenterology

## 2022-03-11 VITALS — BP 142/87 | HR 66 | Temp 97.3°F | Resp 18 | Ht 60.0 in | Wt 148.0 lb

## 2022-03-11 DIAGNOSIS — K449 Diaphragmatic hernia without obstruction or gangrene: Secondary | ICD-10-CM

## 2022-03-11 DIAGNOSIS — K2970 Gastritis, unspecified, without bleeding: Secondary | ICD-10-CM

## 2022-03-11 DIAGNOSIS — K222 Esophageal obstruction: Secondary | ICD-10-CM

## 2022-03-11 DIAGNOSIS — R933 Abnormal findings on diagnostic imaging of other parts of digestive tract: Secondary | ICD-10-CM

## 2022-03-11 DIAGNOSIS — R1013 Epigastric pain: Secondary | ICD-10-CM

## 2022-03-11 DIAGNOSIS — K219 Gastro-esophageal reflux disease without esophagitis: Secondary | ICD-10-CM | POA: Diagnosis not present

## 2022-03-11 DIAGNOSIS — K2950 Unspecified chronic gastritis without bleeding: Secondary | ICD-10-CM | POA: Diagnosis not present

## 2022-03-11 DIAGNOSIS — K319 Disease of stomach and duodenum, unspecified: Secondary | ICD-10-CM

## 2022-03-11 MED ORDER — DICYCLOMINE HCL 10 MG PO CAPS: 10.0000 mg | ORAL_CAPSULE | Freq: Three times a day (TID) | ORAL | 12 refills | Status: DC

## 2022-03-11 MED ORDER — SODIUM CHLORIDE 0.9 % IV SOLN: 500.0000 mL | Freq: Once | INTRAVENOUS | Status: DC

## 2022-03-11 NOTE — Progress Notes (Signed)
See 02/26/2022 H&P, no changes.  ?

## 2022-03-11 NOTE — Progress Notes (Signed)
Report to PACU, RN, vss, BBS= Clear.  

## 2022-03-11 NOTE — Patient Instructions (Signed)
Await pathology results ? ?Hand out on Hiatal hernia and anti reflux measures. ? ?Return to GI clinic in 6 weeks for follow up appointment,to be scheduled.  ? ?YOU HAD AN ENDOSCOPIC PROCEDURE TODAY AT [facility]:   Refer to the procedure report that was given to you for any specific questions about what was found during the examination.  If the procedure report does not answer your questions, please call your gastroenterologist to clarify.  If you requested that your care partner not be given the details of your procedure findings, then the procedure report has been included in a sealed envelope for you to review at your convenience later. ? ?YOU SHOULD EXPECT: Some feelings of bloating in the abdomen. Passage of more gas than usual.  Walking can help get rid of the air that was put into your GI tract during the procedure and reduce the bloating. If you had a lower endoscopy (such as a colonoscopy or flexible sigmoidoscopy) you may notice spotting of blood in your stool or on the toilet paper. If you underwent a bowel prep for your procedure, you may not have a normal bowel movement for a few days. ? ?Please Note:  You might notice some irritation and congestion in your nose or some drainage.  This is from the oxygen used during your procedure.  There is no need for concern and it should clear up in a day or so. ? ?SYMPTOMS TO REPORT IMMEDIATELY: ? ?Following upper endoscopy (EGD) ? Vomiting of blood or coffee ground material ? New chest pain or pain under the shoulder blades ? Painful or persistently difficult swallowing ? New shortness of breath ? Fever of 100?F or higher ? Black, tarry-looking stools ? ?For urgent or emergent issues, a gastroenterologist can be reached at any hour by calling [phone]. ?Do not use MyChart messaging for urgent concerns.  ? ? ?DIET:  We do recommend a small meal at first, but then you may proceed to your regular diet.  Drink plenty of fluids but you should  avoid alcoholic beverages for 24 hours. ? ?ACTIVITY:  You should plan to take it easy for the rest of today and you should NOT DRIVE or use heavy machinery until tomorrow (because of the sedation medicines used during the test).   ? ?FOLLOW UP: ?Our staff will call the number listed on your records 48-72 hours following your procedure to check on you and address any questions or concerns that you may have regarding the information given to you following your procedure. If we do not reach you, we will leave a message.  We will attempt to reach you two times.  During this call, we will ask if you have developed any symptoms of COVID 19. If you develop any symptoms (ie: fever, flu-like symptoms, shortness of breath, cough etc.) before then, please call [phone].  If you test positive for Covid 19 in the 2 weeks post procedure, please call and report this information to [place].   ? ?If any biopsies were taken you will be contacted by phone or by letter within the next 1-3 weeks.  Please call us at [phone] if you have not heard about the biopsies in 3 weeks.  ? ? ?SIGNATURES/CONFIDENTIALITY: ?You and/or your care partner have signed paperwork which will be entered into your electronic medical record.  These signatures attest to the fact that that the information above on your After Visit Summary has been reviewed and is understood.  Full  responsibility of the confidentiality of this discharge information lies with you and/or your care-partner. ? ?

## 2022-03-11 NOTE — Op Note (Signed)
[place] ?Patient Name: [name] ?Procedure Date: 03/11/2022 7:56 AM ?MRN: [id number] ?Endoscopist: [name] , MD ?Age: 56 ?Referring MD:  ?Date of Birth: [birth year] ?Gender: Female ?Account #: [id number] ?Procedure:                Upper GI endoscopy ?Indications:              Epigastric abdominal pain, Abnormal CT of the GI  ?                          tract (stomach) ?Medicines:                Monitored Anesthesia Care ?Procedure:                Pre-Anesthesia Assessment: ?                          - Prior to the procedure, a History and Physical  ?                          was performed, and patient medications and  ?                          allergies were reviewed. The patient's tolerance of  ?                          previous anesthesia was also reviewed. The risks  ?                          and benefits of the procedure and the sedation  ?                          options and risks were discussed with the patient.  ?                          All questions were answered, and informed consent  ?                          was obtained. Prior Anticoagulants: The patient has  ?                          taken no previous anticoagulant or antiplatelet  ?                          agents. ASA Grade Assessment: II - A patient with  ?                          mild systemic disease. After reviewing the risks  ?                          and benefits, the patient was deemed in  ?                          satisfactory condition to undergo the procedure. ?  After obtaining informed consent, the endoscope was  ?                          passed under direct vision. Throughout the  ?                          procedure, the patient's blood pressure, pulse, and  ?                          oxygen saturations were monitored continuously. The  ?                          GIF [id number] #[id number] was introduced through the  ?                          mouth, and advanced to the second part of  duodenum.  ?                          The upper GI endoscopy was accomplished without  ?                          difficulty. The patient tolerated the procedure  ?                          well. ?Scope In: ?Scope Out: ?Findings:                 One benign-appearing, intrinsic mild stenosis was  ?                          found at the gastroesophageal junction. This  ?                          stenosis measured 1.5 cm (inner diameter) x less  ?                          than one cm (in length). The stenosis was traversed. ?                          The exam of the esophagus was otherwise normal. ?                          A small hiatal hernia was present. ?                          Patchy mildly erythematous mucosa without bleeding  ?                          was found in the gastric body and in the gastric  ?                          antrum. Biopsies were taken with a cold forceps for  ?  histology. ?                          The exam of the stomach was otherwise normal. ?                          The duodenal bulb and second portion of the  ?                          duodenum were normal. ?Complications:            No immediate complications. ?Estimated Blood Loss:     Estimated blood loss was minimal. ?Impression:               - Benign-appearing esophageal stenosis. ?                          - Small hiatal hernia. ?                          - Erythematous mucosa in the gastric body and  ?                          antrum. Biopsied. ?                          - Normal duodenal bulb and second portion of the  ?                          duodenum. ?Recommendation:           - Patient has a contact number available for  ?                          emergencies. The signs and symptoms of potential  ?                          delayed complications were discussed with the  ?                          patient. Return to normal activities tomorrow.  ?                          Written discharge  instructions were provided to the  ?                          patient. ?                          - Continue present medications. ?                          - Dicyclomine 10 mg po ac & hs, 1 year of refills. ?                          - Await pathology results. ?                          -  Return to GI office in 6 weeks. ?                          - Resume regular diet today, then advance as  ?                          tolerated. ?                          - Follow antireflux measures. ?[name], MD ?03/11/2022 8:13:34 AM ?This report has been signed electronically. ?

## 2022-03-13 ENCOUNTER — Telehealth: Payer: Self-pay | Admitting: *Deleted

## 2022-03-13 NOTE — Telephone Encounter (Signed)
   Follow up Call-     03/11/2022    7:26 AM 06/22/2019    8:54 AM  Call back number  Post procedure Call Back phone  # [phone]  Permission to leave phone message Yes Yes     Patient questions:  Do you have a fever, pain , or abdominal swelling? No. Pain Score  8 *  Have you tolerated food without any problems? No.  Have you been able to return to your normal activities? Yes.    Do you have any questions about your discharge instructions: Diet   No. Medications  No. Follow up visit  No.  Do you have questions or concerns about your Care? Yes.    Pt states "I'm still having the pain that I was before the procedure. It's a 8 3/4/ 10. It feels like I'm eating glass. I feel like the medication is not working."  I advised pt to give it some time, that it may take a few days before the medication starts working.    Actions: * If pain score is 4 or above: Physician/ provider Notified : Lucio Edward, MD.

## 2022-03-15 ENCOUNTER — Telehealth: Payer: Self-pay

## 2022-03-15 NOTE — Telephone Encounter (Signed)
?  Follow up Call- ? ? ?  03/11/2022  ?  7:26 AM 06/22/2019  ?  8:54 AM  ?Call back number  ?Post procedure Call Back phone  # [phone]  ?Permission to leave phone message Yes Yes  ?  ? ?Patient questions: ? ?Do you have a fever, pain , or abdominal swelling? No. ?Pain Score  0 * ? ?Have you tolerated food without any problems? Yes.   ? ?Have you been able to return to your normal activities? Yes.   ? ?Do you have any questions about your discharge instructions: ?Diet   No. ?Medications  No. ?Follow up visit  No. ? ?Do you have questions or concerns about your Care? No. ? ?Actions: ?* If pain score is 4 or above: ?No action needed, pain <4. ? ? ?

## 2022-03-18 ENCOUNTER — Telehealth: Payer: Self-pay | Admitting: Gastroenterology

## 2022-03-18 NOTE — Telephone Encounter (Signed)
Spoke with patient regarding MD recommendations. She has been scheduled to see Janett Billow, [place] tomorrow 5/16 at 1:30 pm. ?

## 2022-03-18 NOTE — Telephone Encounter (Signed)
Please schedule an APP appt soon. Trial of FDgard 2 po tid before meals ?

## 2022-03-18 NOTE — Telephone Encounter (Signed)
Patient called in with complaints of intermittent, sharp, mid/upper abdominal pain (8/10 pain scale) & bloating. She says it seems to be worse at night or after meals. She ate breakfast this morning & states "it feels like I've swallowed glass." She is able to hold down fluids/food. She has been taking nexium, bentyl, and GI cocktail as prescribed with no relief. Patient was last seen for EGD on 03/11/22, and has follow up scheduled for 04/23/22 with Dr. Fuller Plan. Will route to MD for further recommendations.  ?

## 2022-03-18 NOTE — Telephone Encounter (Signed)
Patient called in follow-up to recent procedure.  Even with the increase in dicyclomine, she is still in a lot of pain.  She has a follow-up with Dr. Fuller Plan on 6/20, but she feels like she needs to be seen sooner rather than later.  Please call patient and advise. Thank you. ?

## 2022-03-19 ENCOUNTER — Encounter: Payer: Self-pay | Admitting: Nurse Practitioner

## 2022-03-19 ENCOUNTER — Encounter: Payer: Self-pay | Admitting: Gastroenterology

## 2022-03-19 ENCOUNTER — Ambulatory Visit (INDEPENDENT_AMBULATORY_CARE_PROVIDER_SITE_OTHER): Payer: BC Managed Care – PPO | Admitting: Nurse Practitioner

## 2022-03-19 VITALS — BP 142/70 | HR 62 | Ht 61.0 in | Wt 144.0 lb

## 2022-03-19 DIAGNOSIS — R1010 Upper abdominal pain, unspecified: Secondary | ICD-10-CM | POA: Diagnosis not present

## 2022-03-19 NOTE — Progress Notes (Signed)
? ? ?Assessment  ? ?Patient Profile:  ?[name] is a 56 y.o. female known to Dr. Fuller Plan with a past medical history of GERD, adenomatous colon polyps, HTN, asthma. Additional medical history as listed in [place] . ? ?Several week history of mid upper abdominal pain, nearly constant but exacerbated by meals. Etiology unclear ?Evaluation has been unrevealing including including labs, CT scan and EGD. Pain has not responded to high dose PPI, Carafate, GI cocktail or QID bentyl. Her weight is stable.  ? ? ?Plan  ? ?Referral to [facility] for a second opinion of persistent abdominal pain ?Will provide a work excuse for today through Friday 5/19  ? ? ?HPI  ? ?Chief Complaint : persistent abdominal pain  ? ?[name] was seen by Ellouise Newer, PA on 02/26/22 after being seen in ED for nausea, vomiting and loose stool. Felt to have gastroenteritis. CT scan was unrevealing. CBC, CMET and lipase were normal. She was having persistent epigastric pain and nausea when she was seen by Anderson Malta in clinic.   We continued her on Carafate, increased Nexium to BID and recommended Gi cocktail as needed. It was noted that she was overdue for surveillance colonoscopy but patient didn't feel she could complete bowel prep. She was scheduled for EGD to evaluate the nausea and upper abdominal pain. EGD essentially unrevealing. She was given Bentyl and asked to follow up in 6 weeks. She was scheduled to be seen in June but called yesterday with persistent abdominal pain ? ? ?Still having the same mid upper abdominal pain as prior to EGD. No nausea / vomiting but has a bad taste in mouth. The pain is stabbing, nearly constant. The pain is worse with eating, no matter what or how little she eats.  The pain wakes her up at night. She finds it difficult to work because the pain is so bad. She is taking Bentyl QID. She started FD Guard yesterday. Taking GI cocktail TID. Taking Nexium BID. No improvement in abdominal pain  with any of these medications.  ? ?Her bowel movement have been normal until this am when she had a bout of diarrhea.  ? ? ?Previous GI Evaluation  ? ?03/11/22 EGD ?- Benign-appearing esophageal stenosis. ?- Small hiatal hernia. ?- Erythematous mucosa in the gastric body and antrum. Biopsied. ?- Normal duodenal bulb and second portion of the duode ? ?Surgical [P], gastric antrum and gastric body biopsy ?- MILD CHRONIC GASTRITIS WITH REACTIVE CHANGES ?- NO INTESTINAL METAPLASIA IDENTIFIED ?- H.pylori stain negative ? ?Imaging  ? ?Labs:  ? ?  Latest Ref Rng & Units 02/14/2022  ?  4:26 PM 11/06/2021  ?  2:45 AM 10/31/2021  ? 11:11 AM  ?CBC  ?WBC 4.0 - 10.5 K/uL 4.8   5.9   5.4    ?Hemoglobin 12.0 - 15.0 g/dL 12.8   12.8   12.9    ?Hematocrit 36.0 - 46.0 % 39.7   39.9   39.3    ?Platelets 150 - 400 K/uL 339   397   338.0    ? ? ? ?  Latest Ref Rng & Units 02/14/2022  ?  4:26 PM 05/18/2021  ? 12:02 PM 01/17/2021  ?  3:03 PM  ?Hepatic Function  ?Total Protein 6.5 - 8.1 g/dL 7.3   7.2   6.7    ?Albumin 3.5 - 5.0 g/dL 4.4   4.3   4.0    ?AST 15 - 41 U/L 15   15  16    ?ALT 0 - 44 U/L _0 ?Alk Phosphatase 38 - 126 U/L 52   54   55    ?Total Bilirubin 0.3 - 1.2 mg/dL 0.4   0.3   0.3    ? ? ?Past Medical History:  ?Diagnosis Date  ? Allergic reaction caused by a drug 01/25/2013  ? probably.   ? Allergy   ? Anemia   ? Arthritis   ? Asthma   ? GERD (gastroesophageal reflux disease)   ? Heart murmur   ? Hypertension   ? Positive TB test   ? Shingles   ? TOBACCO USE 03/22/2009  ? Qualifier: Diagnosis of  By: Regis Bill MD, [name]  Stopped Dec 12 2010    ? ? ?Past Surgical History:  ?Procedure Laterality Date  ? ABDOMINAL HYSTERECTOMY    ? BREAST SURGERY    ? cyst from left breast  ? BUNIONECTOMY    ? both feet  ? CHOLECYSTECTOMY    ? CYSTECTOMY    ? left shoulder  ? KNEE SURGERY Left   ? PANENDOSCOPY    ? TONSILLECTOMY    ? TUBAL LIGATION    ? uterine ablastion    ? and polyps removed  ? Uterine polyps    ? ? ?Current Medications,  Allergies, Family History and Social History were reviewed in [employer] record. ?  ?  ?Current Outpatient Medications  ?Medication Sig Dispense Refill  ? albuterol (PROVENTIL) (2.5 MG/3ML) 0.083% nebulizer solution Inhale 3 mLs (2.5 mg total) by nebulization every 6 (six) hours as needed for wheezing or shortness of breath. 125 mL 12  ? albuterol (VENTOLIN HFA) 108 (90 Base) MCG/ACT inhaler Inhale 2 puffs into the lungs every 4 (four) hours as needed. 24 g 3  ? Alum & Mag Hydroxide-Simeth (GI COCKTAIL) SUSP suspension Shake well. Take 5-10 mL every 4-6 hours as needed. 30 mL 2  ? AMBULATORY NON FORMULARY MEDICATION Medication Name: GI Cocktail - 90 ml Lidocaine 2%:90 ml Dicyclomine 82m/5ml:270 ml Maalox - Take 5-10 ml every 4-6 hours as needed. 450 mL 1  ? cyclobenzaprine (FLEXERIL) 10 MG tablet Take 1 tablet by mouth at bedtime as needed. 30 tablet 0  ? dicyclomine (BENTYL) 10 MG capsule Take 1 capsule (10 mg total) by mouth 4 (four) times daily -  before meals and at bedtime. 120 capsule 12  ? EPINEPHRINE 0.3 mg/0.3 mL IJ SOAJ injection INJECT 1 SYRINGE INTO THE MUSCLE AS NEEDED FOR ANAPHYLAXIS 2 each 1  ? fluticasone (FLONASE) 50 MCG/ACT nasal spray USE 2 SPRAYS IN EACH NOSTRIL DAILY 48 g 2  ? hydrochlorothiazide (HYDRODIURIL) 25 MG tablet Take 1 tablet (25 mg total) by mouth daily. 90 tablet 1  ? HYDROcodone bit-homatropine (HYCODAN) 5-1.5 MG/5ML syrup Take 5 mLs by mouth every 6 (six) hours as needed for cough. 120 mL 0  ? HYDROcodone-acetaminophen (NORCO/VICODIN) 5-325 MG tablet Take 1 tablet by mouth every 6 (six) hours as needed. 8 tablet 0  ? ibuprofen (ADVIL) 200 MG tablet Take 400 mg by mouth every 6 (six) hours as needed for moderate pain.    ? mometasone-formoterol (DULERA) 100-5 MCG/ACT AERO Take 2 puffs first thing in  the morning and then another 2 puffs about 12 hours later. 39 g 3  ? omeprazole (PRILOSEC) 40 MG capsule Take 1 capsule (40 mg total) by mouth 2 (two) times  daily before a meal. 60 capsule 5  ?  sucralfate (CARAFATE) 1 g tablet Take 1 tablet (1 g total) by mouth 4 (four) times daily -  with meals and at bedtime. 180 tablet 2  ? valsartan (DIOVAN) 320 MG tablet Take 0.5 tablets (160 mg total) by mouth daily. 45 tablet 1  ? VITAMIN D PO Take 1 capsule by mouth daily.    ? ?No current facility-administered medications for this visit.  ? ? ?Review of Systems: ?No chest pain. No shortness of breath. No urinary complaints.  ? ? ?Physical Exam ? ?Wt Readings from Last 3 Encounters:  ?03/19/22 144 lb (65.3 kg)  ?03/11/22 148 lb (67.1 kg)  ?03/05/22 146 lb 9.6 oz (66.5 kg)  ? ? ?BP (!) 142/70   Pulse 62   Ht 5' 1" (1.549 m)   Wt 144 lb (65.3 kg)   BMI 27.21 kg/m?  ?Constitutional:  Generally well appearing female in no acute distress. ?Psychiatric: Pleasant. Normal mood and affect. Behavior is normal. ?EENT: Pupils normal.  Conjunctivae are normal. No scleral icterus. ?Neck supple.  ?Cardiovascular: Normal rate, regular rhythm. No edema ?Pulmonary/chest: Effort normal and breath sounds normal. No wheezing, rales or rhonchi. ?Abdominal: Soft, nondistended, diffusely tender to very light palpation.  Bowel sounds active throughout. There are no masses palpable. No hepatomegaly. ?Neurological: Alert and oriented to person place and time. ?Skin: Skin is warm and dry. No rashes noted. ? ?Tye Savoy, NP  03/19/2022, 1:41 PM ? ?I spent 30 minutes total reviewing records, obtaining history, performing exam, counseling patient and documenting visit / findings.  ?Cc:  ?[name], [name], MD ? ? ? ? ? ? ? ?

## 2022-03-19 NOTE — Patient Instructions (Signed)
If you are age 55 or younger, your body mass index should be between 19-25. Your Body mass index is 27.21 kg/m?Marland Kitchen If this is out of the aformentioned range listed, please consider follow up with your Primary Care Provider.  ?________________________________________________________ ? ?The  GI providers would like to encourage you to use [facility] to communicate with providers for non-urgent requests or questions.  Due to long hold times on the telephone, sending your provider a message by [facility] may be a faster and more efficient way to get a response.  Please allow 48 business hours for a response.  Please remember that this is for non-urgent requests.  ?_______________________________________________________ ? ?We will be referring you to [facility]. ? ?Follow up as needed for now. ? ?Thank you for entrusting me with your care and choosing [facility]. ? ?Tye Savoy, NP-C ?

## 2022-03-26 ENCOUNTER — Telehealth: Payer: Self-pay

## 2022-03-26 ENCOUNTER — Encounter: Payer: Self-pay | Admitting: Internal Medicine

## 2022-03-26 ENCOUNTER — Telehealth (INDEPENDENT_AMBULATORY_CARE_PROVIDER_SITE_OTHER): Payer: BC Managed Care – PPO | Admitting: Internal Medicine

## 2022-03-26 DIAGNOSIS — R1013 Epigastric pain: Secondary | ICD-10-CM

## 2022-03-26 NOTE — Progress Notes (Signed)
Virtual Visit via Video Note  I connected with [name] on 03/26/22 at  3:00 PM EDT by a video enabled telemedicine application and verified that I am speaking with the correct person using two identifiers. Location patient: vehicle  Location provider:work  office Persons participating in the virtual visit: patient, provider   HPI: [name] presents for  SDA video visit because of ongoing abdominal pain.  See previous notes has been under evaluation from the GI team that included endoscopy noted to have gastritis get of H. pylori but continues to have abdominal pain despite medication which include PPI and antispasmodic and GI cocktail. Saw assistant in the GI team at the end of last week and note was written to be out of work to return yesterday.  She returned to work yesterday and after about 4 to 5 hours which was after eating food she had severe abdominal pain and had to go home. She presents today in distress and worry about what to do she is taking the medicine but still has pain and now she is getting moody and irritable because this is happening. The GI team has referred her to [facility] for a second look to try to delineate and intervene on what is the cause.  She has rarely taking hydrocodone but makes her drowsy and does not want to take only took 2 over the weekend.  Human resources department is asking for more information so she could at least get paid for some disability while she is out of work and asked what to do next that she does not feel she can work with the abdominal pain of what happened yesterday.  ROS: See pertinent positives and negatives per HPI.  Past Medical History:  Diagnosis Date   Allergic reaction caused by a drug 01/25/2013   probably.    Allergy    Anemia    Arthritis    Asthma    GERD (gastroesophageal reflux disease)    Heart murmur    Hypertension    Positive TB test    Shingles    TOBACCO USE  03/22/2009   Qualifier: Diagnosis of  By: Regis Bill MD, [name]  Stopped Dec 12 2010      Past Surgical History:  Procedure Laterality Date   ABDOMINAL HYSTERECTOMY     BREAST SURGERY     cyst from left breast   BUNIONECTOMY     both feet   CHOLECYSTECTOMY     CYSTECTOMY     left shoulder   KNEE SURGERY Left    PANENDOSCOPY     TONSILLECTOMY     TUBAL LIGATION     uterine ablastion     and polyps removed   Uterine polyps      Family History  Problem Relation Age of Onset   Hypertension Mother    Alzheimer's disease Mother    Hypertension Father    Prostate cancer Father    HIV Brother    Lupus Sister    Colon cancer Paternal Grandmother    Esophageal cancer Neg Hx    Liver cancer Neg Hx    Pancreatic cancer Neg Hx    Rectal cancer Neg Hx    Stomach cancer Neg Hx     Social History   Tobacco Use   Smoking status: Former    Packs/day: 0.50    Years: 17.00    Pack years: 8.50    Types: Cigarettes    Quit date:  12/12/2010    Years since quitting: 11.2   Smokeless tobacco: Never  Vaping Use   Vaping Use: Never used  Substance Use Topics   Alcohol use: No   Drug use: No      Current Outpatient Medications:    albuterol (PROVENTIL) (2.5 MG/3ML) 0.083% nebulizer solution, Inhale 3 mLs (2.5 mg total) by nebulization every 6 (six) hours as needed for wheezing or shortness of breath., Disp: 125 mL, Rfl: 12   albuterol (VENTOLIN HFA) 108 (90 Base) MCG/ACT inhaler, Inhale 2 puffs into the lungs every 4 (four) hours as needed., Disp: 24 g, Rfl: 3   Alum & Mag Hydroxide-Simeth (GI COCKTAIL) SUSP suspension, Shake well. Take 5-10 mL every 4-6 hours as needed., Disp: 30 mL, Rfl: 2   AMBULATORY NON FORMULARY MEDICATION, Medication Name: GI Cocktail - 90 ml Lidocaine 2%:90 ml Dicyclomine '10mg'$ /24m:270 ml Maalox - Take 5-10 ml every 4-6 hours as needed., Disp: 450 mL, Rfl: 1   cyclobenzaprine (FLEXERIL) 10 MG tablet, Take 1 tablet by mouth at bedtime as needed., Disp: 30 tablet,  Rfl: 0   dicyclomine (BENTYL) 10 MG capsule, Take 1 capsule (10 mg total) by mouth 4 (four) times daily -  before meals and at bedtime., Disp: 120 capsule, Rfl: 12   EPINEPHRINE 0.3 mg/0.3 mL IJ SOAJ injection, INJECT 1 SYRINGE INTO THE MUSCLE AS NEEDED FOR ANAPHYLAXIS, Disp: 2 each, Rfl: 1   fluticasone (FLONASE) 50 MCG/ACT nasal spray, USE 2 SPRAYS IN EACH NOSTRIL DAILY, Disp: 48 g, Rfl: 2   hydrochlorothiazide (HYDRODIURIL) 25 MG tablet, Take 1 tablet (25 mg total) by mouth daily., Disp: 90 tablet, Rfl: 1   HYDROcodone bit-homatropine (HYCODAN) 5-1.5 MG/5ML syrup, Take 5 mLs by mouth every 6 (six) hours as needed for cough., Disp: 120 mL, Rfl: 0   HYDROcodone-acetaminophen (NORCO/VICODIN) 5-325 MG tablet, Take 1 tablet by mouth every 6 (six) hours as needed., Disp: 8 tablet, Rfl: 0   ibuprofen (ADVIL) 200 MG tablet, Take 400 mg by mouth every 6 (six) hours as needed for moderate pain., Disp: , Rfl:    mometasone-formoterol (DULERA) 100-5 MCG/ACT AERO, Take 2 puffs first thing in  the morning and then another 2 puffs about 12 hours later., Disp: 39 g, Rfl: 3   omeprazole (PRILOSEC) 40 MG capsule, Take 1 capsule (40 mg total) by mouth 2 (two) times daily before a meal., Disp: 60 capsule, Rfl: 5   sucralfate (CARAFATE) 1 g tablet, Take 1 tablet (1 g total) by mouth 4 (four) times daily -  with meals and at bedtime., Disp: 180 tablet, Rfl: 2   valsartan (DIOVAN) 320 MG tablet, Take 0.5 tablets (160 mg total) by mouth daily., Disp: 45 tablet, Rfl: 1   VITAMIN D PO, Take 1 capsule by mouth daily., Disp: , Rfl:   EXAM: BP Readings from Last 3 Encounters:  03/19/22 (!) 142/70  03/11/22 (!) 142/87  03/05/22 124/78    VITALS per patient if applicable:  GENERAL: alert, oriented, appears well but stressed.  HEENT: atraumatic, conjunttiva clear, no obvious abnormalities on inspection of external nose and ears  NECK: normal movements of the head and neck  LUNGS: on inspection no signs of  respiratory distress, breathing rate appears normal, no obvious gross SOB, gasping or wheezing  PSYCH/NEURO: pleasant and cooperative, no obvious depression or anxiety, speech and thought processing grossly intact Endo results Benign-appearing esophageal stenosis. - Small hiatal hernia. - Erythematous mucosa in the gastric body and antrum. Biopsied. - Normal duodenal bulb  and second portion of the duodenum. Impression: - ASSESSMENT AND PLAN:  Discussed the following assessment and plan:    ICD-10-CM   1. Epigastric abdominal pain post prandial  R10.13      Postprandial epigastric abdominal pain that is debilitating unresponsive to current treatment [facility].  Endoscopy was done May 8 will begin to work has been off and on and yesterday was unable to complete a whole day. Agree with further work-up by specialist. Can give communication that she is having continued problem but involve the GI team GI team should be writing her out of work but I can write a letter to say she has not improved so far and was unable to work a full day yesterday. Counseled.   Expectant management and discussion of plan and treatment with opportunity to ask questions and all were answered. The patient agreed with the plan and demonstrated an understanding of the instructions.   Advised to call back or seek an in-person evaluation if worsening  or having  further concerns  in interim. Return for as indicated depending .    Shanon Ace, MD

## 2022-03-26 NOTE — Telephone Encounter (Signed)
Forms from the [employer] in red folder patient has Vv this afternoon @ 3pm

## 2022-03-27 ENCOUNTER — Telehealth: Payer: Self-pay | Admitting: Gastroenterology

## 2022-03-27 ENCOUNTER — Encounter: Payer: Self-pay | Admitting: Gastroenterology

## 2022-03-27 ENCOUNTER — Telehealth: Payer: Self-pay

## 2022-03-27 DIAGNOSIS — R1010 Upper abdominal pain, unspecified: Secondary | ICD-10-CM

## 2022-03-27 DIAGNOSIS — R933 Abnormal findings on diagnostic imaging of other parts of digestive tract: Secondary | ICD-10-CM

## 2022-03-27 NOTE — Telephone Encounter (Signed)
-----   Message from Willia Craze, NP sent at 03/27/2022  2:11 PM EDT ----- Mickel Baas,   Please call patient. Since she continues to have pain with eating we can get a CT angio of abd / pelvis. This looks for blockages in blood vessels that supply the intestines. Generally patients who have this problems will experience significant weight loss but it is still reasonable to check for it in her. If she hasn't had a BMET in last 30 days then need one to check renal function first. Thanks

## 2022-03-27 NOTE — Telephone Encounter (Signed)
Inbound call from  patient wanting to follow up on Mychart message she sent about  her insurance company ([employer]) requesting attending physician documents to be completed. Patient is requesting a call back to discuss. Please advise.

## 2022-03-27 NOTE — Telephone Encounter (Signed)
Called and spoke with patient regarding Paula's recommendations. Pt would like to proceed with CT angio of abd/pelvis. Pt is aware that I will place the order and radiology scheduling will contact her directly to set up her appt. Pt is aware that she will need to come to our office for BMET prior to her CT scan to check her renal function. Pt knows to expect a call within the next few days to set up her appt. I provided pt with the phone # to radiology scheduling to contact them if she has not heard from them in a timely manner. Pt states that she returned to work on 03/25/22 and had to leave early because she was having so much pain and not able to work. Pt is requesting a work note, she has not returned to work as of yet. Please advise, thanks.  CT angio order in epic. Secure staff message sent to radiology scheduling to contact pt to set up appt. BMET order in epic.

## 2022-03-27 NOTE — Telephone Encounter (Signed)
Patient called & messaged in stating she would need MD to fill out insurance paperwork in order for patient to be out of work for 30 days, or potentially longer depending on [facility] appointment date. She said insurance would be faxing over a copy to our office. Patient has been advised that I have made Dr. Fuller Plan aware & will be back in touch with her soon in regards to paperwork.

## 2022-04-02 ENCOUNTER — Telehealth: Payer: Self-pay

## 2022-04-02 NOTE — Telephone Encounter (Signed)
Patient called in to follow up on insurance paperwork. She states she will have to miss work again tonight due to the pain, and would like to have a work note until at least Thursday once CT scan is completed. Will route to Griffin, NP to follow up in regards to the paperwork.

## 2022-04-02 NOTE — Telephone Encounter (Signed)
Per Nevin Bloodgood, NP we are unable to provide patient with a work note. Patient has been made aware & I will follow up with [facility] to ensure they have received the referral.

## 2022-04-03 NOTE — Telephone Encounter (Signed)
Spoke with [facility] [phone] in regards to patient's referral. They stated they had not received it yet & asked if patient could please give them a call and provide insurance information. Patient has been notified & CMA has faxed over referral again.

## 2022-04-04 ENCOUNTER — Ambulatory Visit (HOSPITAL_BASED_OUTPATIENT_CLINIC_OR_DEPARTMENT_OTHER)
Admission: RE | Admit: 2022-04-04 | Discharge: 2022-04-04 | Disposition: A | Discharge status: Home / Self Care | Payer: BC Managed Care – PPO | Source: Ambulatory Visit | Attending: Nurse Practitioner | Admitting: Nurse Practitioner

## 2022-04-04 DIAGNOSIS — R933 Abnormal findings on diagnostic imaging of other parts of digestive tract: Secondary | ICD-10-CM | POA: Insufficient documentation

## 2022-04-04 DIAGNOSIS — R1010 Upper abdominal pain, unspecified: Secondary | ICD-10-CM | POA: Diagnosis not present

## 2022-04-04 DIAGNOSIS — R109 Unspecified abdominal pain: Secondary | ICD-10-CM | POA: Diagnosis not present

## 2022-04-04 MED ORDER — IOHEXOL 350 MG/ML SOLN
100.0000 mL | Freq: Once | INTRAVENOUS | Status: AC | PRN
  Administered 2022-04-04: 75 mL via INTRAVENOUS

## 2022-04-06 NOTE — Telephone Encounter (Signed)
This form is a disability form f/u originally from dr Elease Hashimoto who saw her for abdominal pain . The Gi team  has  evaluated and although under treatment she is still  having pain of unknown cause .  I cannot fill out a disability form  on these grounds.and I am not managing the problem   If needed, we can document that symptoms are continuing and she has been referred .

## 2022-04-18 NOTE — Telephone Encounter (Signed)
noted 

## 2022-04-23 ENCOUNTER — Ambulatory Visit: Payer: Medicaid Other | Admitting: Gastroenterology

## 2022-05-01 ENCOUNTER — Ambulatory Visit (INDEPENDENT_AMBULATORY_CARE_PROVIDER_SITE_OTHER): Payer: BC Managed Care – PPO | Admitting: Family Medicine

## 2022-05-01 ENCOUNTER — Encounter: Payer: Self-pay | Admitting: Family Medicine

## 2022-05-01 ENCOUNTER — Ambulatory Visit: Payer: BC Managed Care – PPO | Admitting: Family Medicine

## 2022-05-01 VITALS — BP 116/68 | HR 78 | Temp 98.2°F | Ht 61.0 in | Wt 140.9 lb

## 2022-05-01 DIAGNOSIS — L739 Follicular disorder, unspecified: Secondary | ICD-10-CM | POA: Diagnosis not present

## 2022-05-01 MED ORDER — TRIAMCINOLONE ACETONIDE 0.1 % EX CREA: 1.0000 | TOPICAL_CREAM | Freq: Two times a day (BID) | CUTANEOUS | 1 refills | Status: DC

## 2022-05-01 NOTE — Progress Notes (Signed)
Established Patient Office Visit  Subjective   Patient ID: [name], female    DOB: 1966-04-02  Age: 56 y.o. MRN: [id number]  Chief Complaint  Patient presents with   Rash    Patient complains of itchy bumps noted bilateral lower extremities and upper arms x3-4 days    HPI   [name] is seen with rash which is on her lower extremities bilaterally.  This started about 3 to 4 days ago.  Very small approximately 1 mm follicular type bumps.  They seem to have a little bit of a white center initially.  These are drying up already.  No recent hot tub use.  She does use lotion on her legs but no new brands.  No recent change of soaps.  No new medications.  Has felt well overall.  No recent fever.  No rash involving her trunk or upper extremities.  Past Medical History:  Diagnosis Date   Allergic reaction caused by a drug 01/25/2013   probably.    Allergy    Anemia    Arthritis    Asthma    GERD (gastroesophageal reflux disease)    Heart murmur    Hypertension    Positive TB test    Shingles    TOBACCO USE 03/22/2009   Qualifier: Diagnosis of  By: Regis Bill MD, [name]  Stopped Dec 12 2010     Past Surgical History:  Procedure Laterality Date   ABDOMINAL HYSTERECTOMY     BREAST SURGERY     cyst from left breast   BUNIONECTOMY     both feet   CHOLECYSTECTOMY     CYSTECTOMY     left shoulder   KNEE SURGERY Left    PANENDOSCOPY     TONSILLECTOMY     TUBAL LIGATION     uterine ablastion     and polyps removed   Uterine polyps      reports that she quit smoking about 11 years ago. Her smoking use included cigarettes. She has a 8.50 pack-year smoking history. She has never used smokeless tobacco. She reports that she does not drink alcohol and does not use drugs. family history includes Alzheimer's disease in her mother; Colon cancer in her paternal grandmother; HIV in her brother; Hypertension in her father and mother; Lupus in her sister; Prostate cancer in her  father. Allergies  Allergen Reactions   Allegra [Fexofenadine] Hives   Cimetidine Hives    REACTION: unspecified   Diovan Hct [Valsartan-Hydrochlorothiazide] Swelling    Had facial swelling and throat itching after 2 doses of the generic had done well on the brand medicine for a while.  See ED visit February 2014   Purified Water [Water, Sterile] Anaphylaxis   Chocolate Other (See Comments)    triggers Asthma   Citric Acid Other (See Comments)    REACTION: swelling, hives, itching   Coly-Mycin S Other (See Comments)    REACTION: pt reports she passed out   Peanut-Containing Drug Products Swelling   Valsartan Swelling   Amlodipine Other (See Comments)    At 5 mg dose; swelling    Review of Systems  Constitutional:  Negative for chills and fever.  Skin:  Positive for rash.      Objective:     BP 116/68 (BP Location: Left Arm, Patient Position: Sitting, Cuff Size: Normal)   Pulse 78   Temp 98.2 F (36.8 C) (Oral)   Ht '5\' 1"'$  (1.549 m)   Wt 140 lb 14.4 oz (  63.9 kg)   SpO2 98%   BMI 26.62 kg/m    Physical Exam Vitals reviewed.  Constitutional:      Appearance: Normal appearance.  Skin:    Findings: Rash present.     Comments: She has numerous very small 1 mm or less slightly raised follicular type lesions on both lower legs somewhat in regular distribution.  No pustules.  No vesicles.  These appear to be drying up.  No involvement of the upper extremities or trunk  Neurological:     Mental Status: She is alert.      No results found for any visits on 05/01/22.    The ASCVD Risk score (Arnett DK, et al., 2019) failed to calculate for the following reasons:   Cannot find a previous HDL lab   Cannot find a previous total cholesterol lab    Assessment & Plan:   Nonspecific follicular rash lower extremities bilaterally.  Etiology unclear.  ? Viral . Avoid heavy use of lotions or oils for the next few days. Suspect these will resolve without any specific  intervention.  We did write for triamcinolone 0.1% cream to be used twice daily if she has any associated pruritus.  Be in touch for any persistent or worsening rash.  No follow-ups on file.    Jordan Littler, MD

## 2022-05-14 ENCOUNTER — Encounter: Payer: Self-pay | Admitting: Internal Medicine

## 2022-05-14 ENCOUNTER — Ambulatory Visit (INDEPENDENT_AMBULATORY_CARE_PROVIDER_SITE_OTHER): Payer: BC Managed Care – PPO | Admitting: Internal Medicine

## 2022-05-14 VITALS — BP 122/80 | HR 75 | Temp 98.4°F | Ht 61.0 in | Wt 139.4 lb

## 2022-05-14 DIAGNOSIS — J45901 Unspecified asthma with (acute) exacerbation: Secondary | ICD-10-CM

## 2022-05-14 DIAGNOSIS — J45909 Unspecified asthma, uncomplicated: Secondary | ICD-10-CM | POA: Diagnosis not present

## 2022-05-14 MED ORDER — PREDNISONE 20 MG PO TABS: 40.0000 mg | ORAL_TABLET | Freq: Every day | ORAL | 0 refills | Status: DC

## 2022-05-14 MED ORDER — METHYLPREDNISOLONE ACETATE 40 MG/ML IJ SUSP
40.0000 mg | Freq: Once | INTRAMUSCULAR | Status: AC
  Administered 2022-05-14: 40 mg via INTRAMUSCULAR

## 2022-05-14 MED ORDER — METHYLPREDNISOLONE ACETATE 80 MG/ML IJ SUSP
80.0000 mg | Freq: Once | INTRAMUSCULAR | Status: AC
  Administered 2022-05-14: 80 mg via INTRAMUSCULAR

## 2022-05-14 NOTE — Patient Instructions (Signed)
Asthma attack  begin prednisone  tomorrow  Use nebulizer  every 4-6 hours as needed  until improved.  Will do allergy referral? If candidate for allergy  desensitizations   Let us  know how doing in 48 hours :  if worse go to ed.

## 2022-05-14 NOTE — Progress Notes (Signed)
Chief Complaint  Patient presents with   Cough    Thinks asthma flaring up nebulizer not helping comes and goes   Stomach hurts after coughing     HPI: [name] 56 y.o. come in for acute visit   today   underlying hx of asthma allergy  When hot gts bad. Comes and goes.   Spells off an on  no head congestion no fever . 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  She could hear herself wheezing today.  Remote history of allergy testing years ago. ROS: See pertinent positives and negatives per HPI.  No vomiting diarrhea acutely some windedness  Past Medical History:  Diagnosis Date   Allergic reaction caused by a drug 01/25/2013   probably.    Allergy    Anemia    Arthritis    Asthma    GERD (gastroesophageal reflux disease)    Heart murmur    Hypertension    Positive TB test    Shingles    TOBACCO USE 03/22/2009   Qualifier: Diagnosis of  By: Regis Bill MD, [name]  Stopped Dec 12 2010      Family History  Problem Relation Age of Onset   Hypertension Mother    Alzheimer's disease Mother    Hypertension Father    Prostate cancer Father    HIV Brother    Lupus Sister    Colon cancer Paternal Grandmother    Esophageal cancer Neg Hx    Liver cancer Neg Hx    Pancreatic cancer Neg Hx    Rectal cancer Neg Hx    Stomach cancer Neg Hx     Social History   Socioeconomic History   Marital status: Divorced    Spouse name: Not on file   Number of children: Not on file   Years of education: Not on file   Highest education level: Not on file  Occupational History   Not on file  Tobacco Use   Smoking status: Former    Packs/day: 0.50    Years: 17.00    Total pack years: 8.50    Types: Cigarettes    Quit date: 12/12/2010    Years since  quitting: 11.4   Smokeless tobacco: Never  Vaping Use   Vaping Use: Never used  Substance and Sexual Activity   Alcohol use: No   Drug use: No   Sexual activity: Not on file  Other Topics Concern   Not on file  Social History Narrative   Divorced    Tobacco since age 14 stopped feb 2012   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 125 mL 12   albuterol (VENTOLIN HFA) 108 (90 Base) MCG/ACT inhaler Inhale 2 puffs  into the lungs every 4 (four) hours as needed. 24 g 3   Alum & Mag Hydroxide-Simeth (GI COCKTAIL) SUSP suspension Shake well. Take 5-10 mL every 4-6 hours as needed. 30 mL 2   AMBULATORY NON FORMULARY MEDICATION Medication Name: GI Cocktail - 90 ml Lidocaine 2%:90 ml Dicyclomine '10mg'$ /28m:270 ml Maalox - Take 5-10 ml every 4-6 hours as needed. 450 mL 1   cyclobenzaprine (FLEXERIL) 10 MG tablet Take 1 tablet by mouth at bedtime as needed. 30 tablet 0   dicyclomine (BENTYL) 10 MG capsule Take 1 capsule (10 mg total) by mouth 4 (four) times daily -  before meals and at bedtime. 120 capsule 12   EPINEPHRINE 0.3 mg/0.3 mL IJ SOAJ injection INJECT 1 SYRINGE INTO THE MUSCLE AS NEEDED FOR ANAPHYLAXIS 2 each 1   fluticasone (FLONASE) 50 MCG/ACT nasal spray USE 2 SPRAYS IN EACH NOSTRIL DAILY 48 g 2   hydrochlorothiazide (HYDRODIURIL) 25 MG tablet Take 1 tablet (25 mg total) by mouth daily. 90 tablet 1   HYDROcodone bit-homatropine (HYCODAN) 5-1.5 MG/5ML syrup Take 5 mLs by  mouth every 6 (six) hours as needed for cough. 120 mL 0   HYDROcodone-acetaminophen (NORCO/VICODIN) 5-325 MG tablet Take 1 tablet by mouth every 6 (six) hours as needed. 8 tablet 0   ibuprofen (ADVIL) 200 MG tablet Take 400 mg by mouth every 6 (six) hours as needed for moderate pain.     mometasone-formoterol (DULERA) 100-5 MCG/ACT AERO Take 2 puffs first thing in  the morning and then another 2 puffs about 12 hours later. 39 g 3   omeprazole (PRILOSEC) 40 MG capsule Take 1 capsule (40 mg total) by mouth 2 (two) times daily before a meal. 60 capsule 5   sucralfate (CARAFATE) 1 g tablet Take 1 tablet (1 g total) by mouth 4 (four) times daily -  with meals and at bedtime. 180 tablet 2   triamcinolone cream (KENALOG) 0.1 % Apply 1 Application topically 2 (two) times daily. 30 g 1   valsartan (DIOVAN) 320 MG tablet Take 0.5 tablets (160 mg total) by mouth daily. 45 tablet 1   VITAMIN D PO Take 1 capsule by mouth daily.     No facility-administered medications prior to visit.     EXAM:  BP 122/80 (BP Location: Left Arm, Patient Position: Sitting, Cuff Size: Normal)   Pulse 75   Temp 98.4 F (36.9 C) (Oral)   Ht '5\' 1"'$  (1.549 m)   Wt 139 lb 6.4 oz (63.2 kg)   SpO2 98%   BMI 26.34 kg/m   Body mass index is 26.34 kg/m.  GENERAL: vitals reviewed and listed above, alert, oriented, appears well hydrated and in no acute distress some increased respiratory rate wearing mask speech is normal HEENT: atraumatic, conjunctiva  clear, no obvious abnormalities on inspection of external nose and ears  NECK: no obvious masses on inspection palpation  LUNGS: No retractions but air movement is tight and wheezing throughout without rales.  CV: HRRR, no clubbing cyanosis or  peripheral edema nl cap refill  MS: moves all extremities without noticeable focal  abnormality PSYCH: pleasant and cooperative, no obvious depression or anxiety Lab Results  Component Value Date   WBC 4.8 02/14/2022   HGB 12.8  02/14/2022   HCT 39.7 02/14/2022   PLT 339 02/14/2022   GLUCOSE 97 02/14/2022   CHOL 195 05/19/2018   TRIG 41.0 05/19/2018   HDL 83.80 05/19/2018   LDLCALC 103 (H) 05/19/2018   ALT 11 02/14/2022  AST 15 02/14/2022   NA 141 02/14/2022   K 3.3 (L) 02/14/2022   CL 104 02/14/2022   CREATININE 0.93 02/14/2022   BUN 15 02/14/2022   CO2 30 02/14/2022   TSH 0.97 05/18/2021   INR 1.0 01/17/2021   MICROALBUR 0.7 05/30/2009   BP Readings from Last 3 Encounters:  05/14/22 122/80  05/01/22 116/68  03/19/22 (!) 142/70    ASSESSMENT AND PLAN:  Discussed the following assessment and plan:  Exacerbation of asthma, unspecified asthma severity, unspecified whether persistent - Plan: methylPREDNISolone acetate (DEPO-MEDROL) injection 40 mg, methylPREDNISolone acetate (DEPO-MEDROL) injection 80 mg Acute wheezing asthmatic clinically although pulse ox is maintained.  Depo-Medrol 120 mg Prednisone 40 mg a day x5 begin tomorrow nebulizer use note for work to return in 2 days assuming improved. This asthmatic attack was not triggered by the obvious cold respiratory infection and she was on her controller inhalers. Keep her pulmonary appointment but I want to plan referral to allergy to see if she is a candidate for desensitization for inhalants for her asthma trigger. -Patient advised to return or notify health care team  if  new concerns arise.  Patient Instructions  Asthma attack  begin prednisone  tomorrow  Use nebulizer  every 4-6 hours as needed  until improved.  Will do allergy referral? If candidate for allergy  desensitizations   Let us  know how doing in 48 hours :  if worse go to ed.      [name]. Sophy Mesler M.D.

## 2022-05-23 ENCOUNTER — Telehealth: Payer: Self-pay | Admitting: Internal Medicine

## 2022-05-23 NOTE — Telephone Encounter (Signed)
Patient stopped by to drop off paperwork needing to be filled out by PCP for work.    Paper placed in folder for completion       Please advise

## 2022-05-27 ENCOUNTER — Telehealth: Payer: Self-pay

## 2022-05-27 NOTE — Telephone Encounter (Signed)
Last Ov 05/14/22 FMLA paperwork in folder  Please advise

## 2022-05-29 DIAGNOSIS — R110 Nausea: Secondary | ICD-10-CM | POA: Diagnosis not present

## 2022-05-29 DIAGNOSIS — R1013 Epigastric pain: Secondary | ICD-10-CM | POA: Diagnosis not present

## 2022-05-29 DIAGNOSIS — R6881 Early satiety: Secondary | ICD-10-CM | POA: Diagnosis not present

## 2022-05-29 DIAGNOSIS — R1310 Dysphagia, unspecified: Secondary | ICD-10-CM | POA: Diagnosis not present

## 2022-05-31 ENCOUNTER — Encounter: Payer: Self-pay | Admitting: Gastroenterology

## 2022-06-04 ENCOUNTER — Ambulatory Visit (INDEPENDENT_AMBULATORY_CARE_PROVIDER_SITE_OTHER): Payer: BC Managed Care – PPO | Admitting: Allergy and Immunology

## 2022-06-04 ENCOUNTER — Telehealth: Payer: Self-pay

## 2022-06-04 ENCOUNTER — Encounter: Payer: Self-pay | Admitting: Allergy and Immunology

## 2022-06-04 VITALS — BP 118/78 | HR 73 | Temp 98.1°F | Ht 61.0 in | Wt 139.0 lb

## 2022-06-04 DIAGNOSIS — K219 Gastro-esophageal reflux disease without esophagitis: Secondary | ICD-10-CM | POA: Diagnosis not present

## 2022-06-04 DIAGNOSIS — T781XXA Other adverse food reactions, not elsewhere classified, initial encounter: Secondary | ICD-10-CM

## 2022-06-04 DIAGNOSIS — J3089 Other allergic rhinitis: Secondary | ICD-10-CM

## 2022-06-04 DIAGNOSIS — D7219 Other eosinophilia: Secondary | ICD-10-CM

## 2022-06-04 DIAGNOSIS — J4550 Severe persistent asthma, uncomplicated: Secondary | ICD-10-CM

## 2022-06-04 DIAGNOSIS — J301 Allergic rhinitis due to pollen: Secondary | ICD-10-CM

## 2022-06-04 DIAGNOSIS — J383 Other diseases of vocal cords: Secondary | ICD-10-CM

## 2022-06-04 MED ORDER — EPINEPHRINE 0.3 MG/0.3ML IJ SOAJ: 0.3000 mg | INTRAMUSCULAR | 1 refills | Status: DC | PRN

## 2022-06-04 MED ORDER — FAMOTIDINE 40 MG PO TABS: 40.0000 mg | ORAL_TABLET | Freq: Every day | ORAL | 5 refills | Status: DC

## 2022-06-04 MED ORDER — BREZTRI AEROSPHERE 160-9-4.8 MCG/ACT IN AERO: 2.0000 | INHALATION_SPRAY | Freq: Two times a day (BID) | RESPIRATORY_TRACT | 5 refills | Status: DC

## 2022-06-04 MED ORDER — CETIRIZINE HCL 10 MG PO TABS: 10.0000 mg | ORAL_TABLET | Freq: Every day | ORAL | 5 refills | Status: DC | PRN

## 2022-06-04 MED ORDER — ESOMEPRAZOLE MAGNESIUM 40 MG PO CPDR: 40.0000 mg | DELAYED_RELEASE_CAPSULE | Freq: Two times a day (BID) | ORAL | 5 refills | Status: DC

## 2022-06-04 NOTE — Progress Notes (Unsigned)
[name] - [place] - [name] - [place] - [name]   Dear Dr. Regis Bill,  Thank you for referring [name] to the [facility] on 06/04/2022.   Below is a summation of this patient's evaluation and recommendations.  Thank you for your referral. I will keep you informed about this patient's response to treatment.   If you have any questions please do not hesitate to contact me.   Sincerely,  Jiles Prows, MD Allergy / Immunology [place]   ______________________________________________________________________    NEW PATIENT NOTE  Referring Provider: Burnis Medin, MD Primary Provider: Burnis Medin, MD Date of office visit: 06/04/2022    Subjective:   Chief Complaint:  [name] (DOB: [birth year]) is a 56 y.o. female who presents to the clinic on 06/04/2022 with a chief complaint of New Patient (Initial Visit) (Asthma, sees [facility]/Pulm; year round allergies; previous allergy testing many years ago, ([facility]) did not complete testing due to reaction (?)) .     HPI: [name] presents to this clinic in evaluation of asthma.  She has a long history of asthma dating back to 2012.  Her asthma is manifested as tightness in her upper chest, squeezing, choking, coughing, shortness of breath, lightheadedness, and a change in her voice.  When she uses a short acting bronchodilator delivered by multidose inhaler it does not help.  When she uses albuterol delivered by nebulizer it helped somewhat.  Triggers for her asthma include perfumes and cleaning scents and dog.  She has required the administration of a systemic steroid 4 times in 2023 for this issue.  In addition, she has constant throat clearing and the need to clear out her throat and feeling as though there is stuff in her throat that she cannot clear out.  She has a history of vocal cord nodules.  She has very bad reflux  with regurgitation even though she uses Nexium twice a day.  She drinks 2 coffees per day and has no other source of caffeine and does not drink any alcohol.  She has some spring and fall nasal symptoms with stuffiness and sneezing for which she will use Flonase during the spring and fall.  She states that she has allergies to foods.  When she eats chocolate it gives rise to asthma.  If she eats kiwi or citrus or papaya or mango or grapefruit she gets hives.  If she eats peanuts or coconut or cashews or almond she gets itchy all over.  If she drinks purified water she swells up all over.  Past Medical History:  Diagnosis Date   Allergic reaction caused by a drug 01/25/2013   probably.    Allergy    Anemia    Arthritis    Asthma    GERD (gastroesophageal reflux disease)    Heart murmur    Hypertension    Positive TB test    Shingles    TOBACCO USE 03/22/2009   Qualifier: Diagnosis of  By: Regis Bill MD, [name]  Stopped Dec 12 2010      Past Surgical History:  Procedure Laterality Date   ABDOMINAL HYSTERECTOMY     BREAST SURGERY     cyst from left breast   BUNIONECTOMY     both feet   CHOLECYSTECTOMY     CYSTECTOMY     left shoulder   KNEE SURGERY Left    PANENDOSCOPY  TONSILLECTOMY     TUBAL LIGATION     uterine ablastion     and polyps removed   Uterine polyps      Allergies as of 06/04/2022       Reactions   Allegra [fexofenadine] Hives   Cimetidine Hives   REACTION: unspecified   Diovan Hct [valsartan-hydrochlorothiazide] Swelling   Had facial swelling and throat itching after 2 doses of the generic had done well on the brand medicine for a while.  See ED visit February 2014   Purified Water [place] Gambler, Sterile] Anaphylaxis   Chocolate Other (See Comments)   triggers Asthma   Citric Acid Other (See Comments)   REACTION: swelling, hives, itching   Coly-mycin S Other (See Comments)   REACTION: pt reports she passed out   Peanut-containing Drug Products Swelling    Valsartan Swelling   Amlodipine Other (See Comments)   At 5 mg dose; swelling        Medication List    albuterol (2.5 MG/3ML) 0.083% nebulizer solution Commonly known as: PROVENTIL Inhale 3 mLs (2.5 mg total) by nebulization every 6 (six) hours as needed for wheezing or shortness of breath.   albuterol 108 (90 Base) MCG/ACT inhaler Commonly known as: VENTOLIN HFA Inhale 2 puffs into the lungs every 4 (four) hours as needed.   AMBULATORY NON FORMULARY MEDICATION Medication Name: GI Cocktail - 90 ml Lidocaine 2%:90 ml Dicyclomine '10mg'$ /57m:270 ml Maalox - Take 5-10 ml every 4-6 hours as needed.   cyclobenzaprine 10 MG tablet Commonly known as: FLEXERIL Take 1 tablet by mouth at bedtime as needed.   dicyclomine 10 MG capsule Commonly known as: BENTYL Take 1 capsule (10 mg total) by mouth 4 (four) times daily -  before meals and at bedtime.   EPINEPHrine 0.3 mg/0.3 mL Soaj injection Commonly known as: EPI-PEN INJECT 1 SYRINGE INTO THE MUSCLE AS NEEDED FOR ANAPHYLAXIS   fluticasone 50 MCG/ACT nasal spray Commonly known as: FLONASE USE 2 SPRAYS IN EACH NOSTRIL DAILY   gi cocktail Susp suspension Shake well. Take 5-10 mL every 4-6 hours as needed.   hydrochlorothiazide 25 MG tablet Commonly known as: HYDRODIURIL Take 1 tablet (25 mg total) by mouth daily.   ibuprofen 200 MG tablet Commonly known as: ADVIL Take 400 mg by mouth every 6 (six) hours as needed for moderate pain.   mometasone-formoterol 100-5 MCG/ACT Aero Commonly known as: DULERA Take 2 puffs first thing in  the morning and then another 2 puffs about 12 hours later.   omeprazole 40 MG capsule Commonly known as: PRILOSEC Take 1 capsule (40 mg total) by mouth 2 (two) times daily before a meal.   sucralfate 1 g tablet Commonly known as: Carafate Take 1 tablet (1 g total) by mouth 4 (four) times daily -  with meals and at bedtime.   triamcinolone cream 0.1 % Commonly known as: KENALOG Apply 1 Application  topically 2 (two) times daily.   valsartan 320 MG tablet Commonly known as: DIOVAN Take 0.5 tablets (160 mg total) by mouth daily.   VITAMIN D PO Take 1 capsule by mouth daily.    Review of systems negative except as noted in HPI / PMHx or noted below:  Review of Systems  Constitutional: Negative.   HENT: Negative.    Eyes: Negative.   Respiratory: Negative.    Cardiovascular: Negative.   Gastrointestinal: Negative.   Genitourinary: Negative.   Musculoskeletal: Negative.   Skin: Negative.   Neurological: Negative.   Endo/Heme/Allergies: Negative.  Psychiatric/Behavioral: Negative.      Family History  Problem Relation Age of Onset   Hypertension Mother    Alzheimer's disease Mother    Hypertension Father    Prostate cancer Father    HIV Brother    Lupus Sister    Colon cancer Paternal Grandmother    Esophageal cancer Neg Hx    Liver cancer Neg Hx    Pancreatic cancer Neg Hx    Rectal cancer Neg Hx    Stomach cancer Neg Hx     Social History   Socioeconomic History   Marital status: Divorced    Spouse name: Not on file   Number of children: Not on file   Years of education: Not on file   Highest education level: Not on file  Occupational History   Not on file  Tobacco Use   Smoking status: Former    Packs/day: 0.50    Years: 17.00    Total pack years: 8.50    Types: Cigarettes    Quit date: 12/12/2010    Years since quitting: 11.4   Smokeless tobacco: Never  Vaping Use   Vaping Use: Never used  Substance and Sexual Activity   Alcohol use: No   Drug use: No   Sexual activity: Not on file  Other Topics Concern   Not on file  Social History Narrative   Divorced    Tobacco since age 38 stopped feb 2012   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  She works in a Charity fundraiser.  She has a history of tobacco smoking at about 1/2 pack/day for approximately 10 years which she discontinued in 2012.  Objective:   Vitals:   06/04/22 0934  BP: 118/78  Pulse: 73  Temp: 98.1 F (36.7 C)  SpO2: 100%   Height: '5\' 1"'$  (154.9 cm) Weight: 139 lb (63 kg)  Physical Exam Constitutional:      Appearance: She is not diaphoretic.     Comments: Incessant throat clearing  HENT:     Head: Normocephalic.     Right Ear: Tympanic membrane, ear canal and external ear normal.     Left Ear: Tympanic membrane, ear canal and external ear normal.     Nose: Nose normal. No mucosal edema or rhinorrhea.     Mouth/Throat:     Pharynx: Uvula midline. No oropharyngeal exudate.  Eyes:     Conjunctiva/sclera: Conjunctivae normal.  Neck:     Thyroid: No thyromegaly.     Trachea: Trachea normal. No tracheal tenderness or tracheal deviation.  Cardiovascular:     Rate and Rhythm: Normal rate and regular rhythm.     Heart sounds: Normal heart sounds, S1 normal and S2 normal. No murmur heard. Pulmonary:     Effort: No respiratory distress.     Breath sounds: Normal breath sounds. No stridor. No wheezing or rales.  Lymphadenopathy:     Head:     Right side of head: No tonsillar adenopathy.     Left side of head: No tonsillar adenopathy.     Cervical: No cervical adenopathy.  Skin:    Findings: No erythema or rash.  Nails: There is no clubbing.  Neurological:     Mental Status: She is alert.     Diagnostics: Allergy skin tests were performed.  She demonstrated hypersensitivity to house dust mite, trees, grasses, weeds.  She did not demonstrate any hypersensitivity against a screening panel of foods.  Spirometry was performed and demonstrated an FEV1 of 1.0 @ 49 % of predicted. FEV1/FVC = 0.73.  Following the administration of nebulized  albuterol her FEV1 rose to 1.25 which was an increase in the FEV1 of 25%.  Results of blood tests obtained 31 October 2021 identified WBC 5.4, absolute eosinophil 400, absolute lymphocyte 2300, hemoglobin 12.9, platelet 338  Results of a chest CT angio obtained 31 October 2021 identified the following:  Cardiovascular: Adequate opacification of the pulmonary arterial tree. No intraluminal filling defect identified to suggest acute pulmonary embolism. Central pulmonary arteries are of normal caliber. No significant coronary artery calcification. Cardiac size within normal limits. No pericardial effusion. No significant atherosclerotic calcification within the thoracic aorta. No aortic aneurysm.   Mediastinum/Nodes: No enlarged mediastinal, hilar, or axillary lymph nodes. Thyroid gland, trachea, and esophagus demonstrate no significant findings.   Lungs/Pleura: Mild bibasilar atelectasis. No superimposed confluent pulmonary infiltrate. No pneumothorax or pleural effusion. Central airways are widely patent.  Results of echocardiogram obtained 12 June 2018 identified the following:  - LVEF 60-65%, moderate LVH, normal wall motion, grade 1 DD,    indeterminate LV filing pressure, trivial MR, normal LA size,    normal IVC.   Assessment and Plan:    1. Not well controlled severe persistent asthma   2. Perennial allergic rhinitis   3. Seasonal allergic rhinitis due to pollen   4. Adverse food reaction, initial encounter   5. LPRD (laryngopharyngeal reflux disease)   6. Vocal cord dysfunction   7. Other eosinophilia     1.  Allergen avoidance measures - dust mite, pollens (foods?)  2.  Treat and prevent inflammation:  A. Breztri - 2 inhalations 2 times per day w/spacer (empty lungs) B. Flonase - 1 spray each nostril 2 times per day  3.  Treat and prevent LPR:  A. Minimize caffeine consumption B. Nexium 40 mg - 1 tablet 2 times per day C. Famotidine 40 mg - 1 tablet in  evening D. Replace all throat clearing with drinking/swallowing maneuver  4.  If needed:  A. Albuterol HFA 2 inhalations or nebulizer every 4-6 hours B. Cetirizine 10 mg - 1 tablet 1 time per day C. Epi-Pen, benadryl, MD/ER evaluation for allergic reaction  5.  Blood tests - nut panel w/R, citrus panel, kiwi, papaya, mango, coconut, IgE  6. Immunotherapy???  7. Biologic agent???  8. Return to clinic in 4 weeks or earlier if problem  Ravleen has an atopic immune system and we will attempt to get that under control with allergen avoidance measures and the use of anti-inflammatory agents for her airway.  I think that she is going to end up using a biologic agent especially given her eosinophilia that is contributing to her respiratory tract symptomatology.  She also appears to have a component of LPR/vocal cord dysfunction and we will attempt to get that under control with aggressive therapy directed against reflux as noted above.  I will contact her with the results of her blood test once they are available for review and we may need to have her work through in-clinic food challenges to establish the extent of her apparent food allergy.  I will see her back in  this clinic in 4 weeks or earlier if there is a problem.  Jiles Prows, MD Allergy / Immunology [facility]

## 2022-06-04 NOTE — Telephone Encounter (Signed)
PA for [facility] started.....  KEYLynett Grimes - PA Case ID: [id number] -Rx#: [id number]  created: 06/04/22

## 2022-06-04 NOTE — Patient Instructions (Addendum)
  1.  Allergen avoidance measures  2.  Treat and prevent inflammation:  A. Breztri - 2 inhalations 2 times per day w/spacer (empty lungs) B. Flonase - 1 spray each nostril 2 times per day  3.  Treat and prevent LPR:  A. Minimize caffeine consumption B. Nexium 40 mg - 1 tablet 2 times per day C. Famotidine 40 mg - 1 tablet in evening D. Replace all throat clearing with drinking/swallowing maneuver  4.  If needed:  A. Albuterol HFA 2 inhalations or nebulizer every 4-6 hours B. Cetirizine 10 mg - 1 tablet 1 time per day C. Epi-Pen, benadryl, MD/ER evaluation for allergic reaction  5.  Blood tests - nut panel w/R, citrus panel, kiwi, papaya, mango, coconut, IgE  6. Immunotherapy???  7. Biologic agent???  8. Return to clinic in 4 weeks or earlier if problem

## 2022-06-05 ENCOUNTER — Encounter: Payer: Self-pay | Admitting: Allergy and Immunology

## 2022-06-06 NOTE — Telephone Encounter (Signed)
Form on cma desk please copy and scan save

## 2022-06-06 NOTE — Telephone Encounter (Signed)
Form has been completed and signed ; please copy   so can be saved and scanned  placed on cma desk area

## 2022-06-07 ENCOUNTER — Ambulatory Visit: Payer: BC Managed Care – PPO | Admitting: Internal Medicine

## 2022-06-07 LAB — IGE NUT PROF. W/COMPONENT RFLX
F017-IgE Hazelnut (Filbert): <0.1 kU/L
F018-IgE Brazil Nut: <0.1 kU/L
F020-IgE Almond: <0.1 kU/L
F202-IgE Cashew Nut: <0.1 kU/L
F203-IgE Pistachio Nut: <0.1 kU/L
F256-IgE Walnut: <0.1 kU/L
Macadamia Nut, IgE: <0.1 kU/L
Peanut, IgE: <0.1 kU/L
Pecan Nut IgE: <0.1 kU/L

## 2022-06-07 LAB — F293-IGE PAPAYA FOOD: F293-IgE Papaya Food: <0.1 kU/L

## 2022-06-07 LAB — ALLERGEN PROFILE, FOOD-CITRUS
Allergen Grapefruit IgE: <0.1 kU/L
Allergen Lime IgE: <0.1 kU/L
Lemon: <0.1 kU/L
Orange: <0.1 kU/L
Tangerine IgE: <0.1 kU/L

## 2022-06-07 LAB — IGE: IgE (Immunoglobulin E), Serum: 29 IU/mL (ref 6–495)

## 2022-06-07 LAB — ALLERGEN, MANGO, F91: Mango IgE: <0.1 kU/L

## 2022-06-07 LAB — ALLERGEN COCONUT IGE: Allergen Coconut IgE: <0.1 kU/L

## 2022-06-10 ENCOUNTER — Ambulatory Visit (INDEPENDENT_AMBULATORY_CARE_PROVIDER_SITE_OTHER): Payer: BC Managed Care – PPO | Admitting: Internal Medicine

## 2022-06-10 ENCOUNTER — Encounter: Payer: Self-pay | Admitting: Internal Medicine

## 2022-06-10 DIAGNOSIS — J45991 Cough variant asthma: Secondary | ICD-10-CM | POA: Diagnosis not present

## 2022-06-10 NOTE — Progress Notes (Signed)
Subjective:    Patient ID: [name], female    DOB: 05-21-1966,  .   MRN: [id number]    Brief patient profile:  11 yobf  Quit smoking 2012 with onset of variable sob 2010 but nl pfts while on symbicort 02/20/11    History of Present Illness  January 08, 2011 ov cc worse dyspnea, wheezing no cough x 2 weeks no better p inalers, feels something stuck in ssn but swallowing ok. general chest tightness not better with saba.  Patient Instructions:  1) Work on inhaler technique: relax and blow all the way out then take a nice smooth deep breath back in, triggering the inhaler at same time you start breathing in  2) Symbicort 2 puffs first thing in am and 2 puffs again in pm about 12 hours later  3) Change nexium Take one 30-60 min before first meal of the day and add pepcid 20 mg one at bedtime  4) GERD (REFLUX)          02/12/2018  f/u ov/[name] re: ? Flare of asthma  Not even 50% better p er rx x 2 in Feb 2019  Chief Complaint  Patient presents with   Pulmonary Consult    Self referral.  She c/o SOB x 3 months "all the time"- with or without any exertion. She states when she lies down she hears "crackling noise".  She also c/o non prod cough- usually worse at night and can be triggered by cleaning products. She has been using proair 3-4 x per day, but none in the past wk.   Dyspnea:  Variably sob with exertion / prior to Nov 04 2017 was fine with good aerobic capacity consistently fast walk up hills off maint rx x years on otc nexium only bedtime and variably bad breath/ hb on this rx  Cough: 30 m to an hour p hs/ not as common later or early am /worse with exp to clearing products Sleep: most nights ok SABA use:  Way over using now where wasn't using anything prior to Nov 04 2017 and not helping rec Plan A = Automatic = Symbicort 80 Take 2 puffs first thing in am and then another 2 puffs about 12 hours later.  Work on inhaler technique:  Plan B = Backup Only use your  albuterol(Proventil/Proair/Ventolin)  as a rescue medication Protonix Take 30- 60 min before your first and last meals of the day  GERD diet   03/19/2018  f/u ov/[name] re:  S/p asthma flare, back in control  Chief Complaint  Patient presents with   Follow-up    Breathing is much improved and cough has resolved. She has only had to use her albuterol inhaler once since her last visit.    Dyspnea:  Ok including steps  Cough: no Sleep: ok  SABA use:  Only x one  rec Please schedule a follow up visit in 6  months but call sooner if needed  Continue the reflux diet Try off protonix and take over the counter  pepcid 20 mg after bfast and supper x 1 week then just after bfast x one week then off pepcid and if can't taper off then let your Primary care doctor know for purposes of refilling the protonix or taking nexium as needed     06/01/2021  f/u ov/[name] re: cough variant asthma/ problems with accessing meds / already failed advair  Chief Complaint  Patient presents with   Follow-up    Patient is here  for a follow up and wants to talk about her symbicort.    Dyspnea:  Not limited by breathing from desired activities   Cough: no Sleeping:  SABA use:  none 02: none  Covid status:   none / says  had 3  covid infections and declines vaccination Rec Plan A = Automatic = Always=    Dulera 100 Take 2 puffs first thing in am and then another 2 puffs about 12 hours later.   Plan B = Backup (to supplement plan A, not to replace it) Only use your albuterol inhaler (proair) as a rescue medication  Plan C = Crisis (instead of Plan B but only if Plan B stops working) - only use your albuterol nebulizer if you first try Plan B and it fails to help    06/04/22 [facility]ozlow eval > rec breztri / consider biologics   06/10/2022  f/u ov/[name] re: asthma / freq steroids last dpme  2 weeks prior maint on breztri 2 bid   Chief Complaint  Patient presents with   Follow-up    Breathing is overall doing well today.  She states had increased chest tightness and cough a few wks ago- better after steroid injection.    Dyspnea:  - Not limited by breathing from desired activities    Cough: ok now Sleeping: bed is flat with lots of pillows under head  or immediately starts coughing SABA use: not using now  02: not Covid status:   none    No obvious day to day or daytime variability or assoc excess/ purulent sputum or mucus plugs or hemoptysis or cp or chest tightness, subjective wheeze or overt sinus or hb symptoms.   Sleeping  without nocturnal  or early am exacerbation  of respiratory  c/o's or need for noct saba. Also denies any obvious fluctuation of symptoms with weather or environmental changes or other aggravating or alleviating factors except as outlined above   No unusual exposure hx or h/o childhood pna/ asthma or knowledge of premature birth.  Current Allergies, Complete Past Medical History, Past Surgical History, Family History, and Social History were reviewed in [facility] record.  ROS  The following are not active complaints unless bolded Hoarseness, sore throat, dysphagia, dental problems, itching, sneezing,  nasal congestion or discharge of excess mucus or purulent secretions, ear ache,   fever, chills, sweats, unintended wt loss or wt gain, classically pleuritic or exertional cp,  orthopnea pnd or arm/hand swelling  or leg swelling, presyncope, palpitations, abdominal pain, anorexia, nausea, vomiting, diarrhea  or change in bowel habits or change in bladder habits, change in stools or change in urine, dysuria, hematuria,  rash, arthralgias, visual complaints, headache, numbness, weakness or ataxia or problems with walking or coordination,  change in mood or  memory.        Current Meds  Medication Sig   albuterol (PROVENTIL) (2.5 MG/3ML) 0.083% nebulizer solution Inhale 3 mLs (2.5 mg total) by nebulization every 6 (six) hours as needed for wheezing or shortness of  breath.   albuterol (VENTOLIN HFA) 108 (90 Base) MCG/ACT inhaler Inhale 2 puffs into the lungs every 4 (four) hours as needed.   Alum & Mag Hydroxide-Simeth (GI COCKTAIL) SUSP suspension Shake well. Take 5-10 mL every 4-6 hours as needed.   AMBULATORY NON FORMULARY MEDICATION Medication Name: GI Cocktail - 90 ml Lidocaine 2%:90 ml Dicyclomine '10mg'$ /69m:270 ml Maalox - Take 5-10 ml every 4-6 hours as needed.   Budeson-Glycopyrrol-Formoterol (BREZTRI AEROSPHERE) 160-9-4.8 MCG/ACT  AERO Inhale 2 puffs into the lungs in the morning and at bedtime.   cetirizine (ZYRTEC) 10 MG tablet Take 1 tablet (10 mg total) by mouth daily as needed for allergies (Can take an extra dose during flare ups.).   cyclobenzaprine (FLEXERIL) 10 MG tablet Take 1 tablet by mouth at bedtime as needed.   dicyclomine (BENTYL) 10 MG capsule Take 1 capsule (10 mg total) by mouth 4 (four) times daily -  before meals and at bedtime.   EPINEPHrine 0.3 mg/0.3 mL IJ SOAJ injection Inject 0.3 mg into the muscle as needed for anaphylaxis.   esomeprazole (NEXIUM) 40 MG capsule Take 1 capsule (40 mg total) by mouth 2 (two) times daily. (Patient taking differently: Take 40 mg by mouth daily.)   famotidine (PEPCID) 40 MG tablet Take 1 tablet (40 mg total) by mouth daily.   fluticasone (FLONASE) 50 MCG/ACT nasal spray USE 2 SPRAYS IN EACH NOSTRIL DAILY   hydrochlorothiazide (HYDRODIURIL) 25 MG tablet Take 1 tablet (25 mg total) by mouth daily.   ibuprofen (ADVIL) 200 MG tablet Take 400 mg by mouth every 6 (six) hours as needed for moderate pain.   triamcinolone cream (KENALOG) 0.1 % Apply 1 Application topically 2 (two) times daily.   valsartan (DIOVAN) 320 MG tablet Take 0.5 tablets (160 mg total) by mouth daily.   VITAMIN D PO Take 1 capsule by mouth daily.              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 Quit 12/12/10. Smoked for approx 10 yrs up to 1/4 ppd  Alcohol use-yes  [employer]               Objective:   Physical Exam   Wts  06/10/2022           142  06/01/2021        145  09/08/2019        146   05/12/19 150 lb (68 kg)  04/27/19 132 lb (59.9 kg)  11/11/18 141 lb (64 kg)    Vital signs reviewed  06/10/2022  - Note at rest 02 sats  99% on RA   General appearance:    amb bf nad   HEENT : Oropharynx  clear     Nasal turbinates mild edema / non-specific    NECK :  without  apparent JVD/ palpable Nodes/TM    LUNGS: no acc muscle use,  Nl contour chest which is clear to A and P bilaterally without cough on insp or exp maneuvers   CV:  RRR  no s3 or murmur or increase in P2, and no edema   ABD:  soft and nontender with nl inspiratory excursion in the supine position. No bruits or organomegaly appreciated   MS:  Nl gait/ ext warm without deformities Or obvious joint restrictions  calf tenderness, cyanosis or clubbing    SKIN: warm and dry without lesions    NEURO:  alert, approp, nl sensorium with  no motor or cerebellar deficits apparent.           Assessment &  Plan:            Assessment & Plan:

## 2022-06-10 NOTE — Telephone Encounter (Signed)
The form was copied. Contacted patient. Left a voicemail that the form is ready to be pick up. To call back if has question.

## 2022-06-10 NOTE — Assessment & Plan Note (Addendum)
Onset 2010  - PFT's wnl 02/20/11  While on symbicort with improved symptom control > f/u pulm prn - FENO 02/12/2018  =   62 on symb 80 2bid with poor hfa - Spirometry 02/12/2018  Nl   - 02/12/2018    continue symb 80 2bid and add max gerd rx  - Allergy profile 02/12/18  >  Eos 0.4 /  IgE  50  RAST Pos grass > dog  - 03/19/2018 try to taper off gerd rx > flared 05/2018 so resumed - FENO 05/26/2018  =   26 on symb 80 2bid   06/10/2022  After extensive coaching inhaler device,  effectiveness =    90%   At present, All goals of chronic asthma control met including optimal function and elimination of symptoms with minimal need for rescue therapy.  Contingencies discussed in full including contacting this office immediately if not controlling the symptoms using the rule of two's.     Agree with Dr Neldon Mc likely will need biologics based on how freq she appears to need saba and prednisone for flares but not sure she's really filling the maint rx consistently  - will reqest pharmacy records.            Each maintenance medication was reviewed in detail including emphasizing most importantly the difference between maintenance and prns and under what circumstances the prns are to be triggered using an action plan format where appropriate.  Total time for H and P, chart review, counseling, reviewing when to use hfa/neb  device(s) and generating customized AVS unique to this office visit / same day charting = 21 min

## 2022-06-10 NOTE — Patient Instructions (Signed)
Plan A = Automatic = Always=    Breztri Take 2 puffs first thing in am and then another 2 puffs about 12 hours later.     Plan B = Backup (to supplement plan A, not to replace it) Only use your albuterol inhaler as a rescue medication to be used if you can't catch your breath by resting or doing a relaxed purse lip breathing pattern.  - The less you use it, the better it will work when you need it. - Ok to use the inhaler up to 2 puffs  every 4 hours if you must but call for appointment if use goes up over your usual need - Don't leave home without it !!  (think of it like the spare tire for your car)   Plan C = Crisis (instead of Plan B but only if Plan B stops working) - only use your albuterol nebulizer if you first try Plan B and it fails to help > ok to use the nebulizer up to every 4 hours but if start needing it regularly call for immediate appointment

## 2022-06-11 NOTE — Telephone Encounter (Signed)
Contacted patient and update her that the form is ready for a pick up at the front desk. She states she would pick up the form this week.

## 2022-06-12 ENCOUNTER — Telehealth: Payer: Self-pay | Admitting: Allergy and Immunology

## 2022-06-12 NOTE — Telephone Encounter (Signed)
Patient states she has lost her job and has lost her insurance. Patient is wanting to know if she could pick up a sample of Breztri until her new insurance starts at the beginning of September.   Please advise.   Best contact number: [phone]

## 2022-06-12 NOTE — Telephone Encounter (Signed)
Pt informed of samples and sitting up front with ms carla

## 2022-07-02 ENCOUNTER — Ambulatory Visit: Payer: BC Managed Care – PPO | Admitting: Allergy and Immunology

## 2022-07-12 NOTE — Telephone Encounter (Signed)
Patient states she is having issues with her INS and believes it will be effective in a couple of weeks. Patient is requesting Breztri samples if we have some available here in the office. She is running out of the inhaler and would like to pick some up today if possible.

## 2022-07-12 NOTE — Telephone Encounter (Signed)
Samples were placed at the front for patient to pick up. Patient was informed and verbalized understanding

## 2022-07-18 ENCOUNTER — Other Ambulatory Visit: Payer: Self-pay | Admitting: Internal Medicine

## 2022-07-25 ENCOUNTER — Other Ambulatory Visit: Payer: Self-pay | Admitting: *Deleted

## 2022-07-25 MED ORDER — ALBUTEROL SULFATE HFA 108 (90 BASE) MCG/ACT IN AERS: 2.0000 | INHALATION_SPRAY | RESPIRATORY_TRACT | 3 refills | Status: DC | PRN

## 2022-07-27 IMAGING — CT CT CTA ABD/PEL W/CM AND/OR W/O CM
3 of 9 series · 11 of 46 positions shown, 17 images · IV contrast (APPLIED)
Comparison: 02/14/2022

CLINICAL DATA: Persistent upper abdominal pain for 2 months

Prior CT suspicious for gastritis
EXAM:
CTA ABDOMEN AND PELVIS WITHOUT AND WITH CONTRAST
TECHNIQUE: Multidetector CT imaging of the abdomen and pelvis was performed
using the standard protocol during bolus administration of
intravenous contrast. Multiplanar reconstructed images and MIPs were
obtained and reviewed to evaluate the vascular anatomy.

[Series 4: arterial · axial · arterial · 0.73mm/px · z∈[-684,-600]mm · 3 of 199 slices shown]
[im 21/199  soft-tissue]
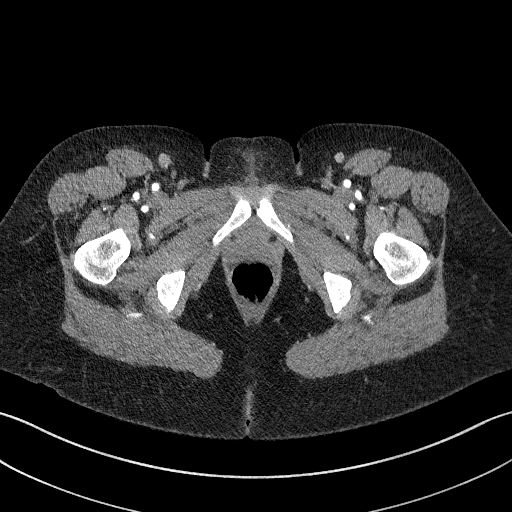
[im 42/199  soft-tissue]
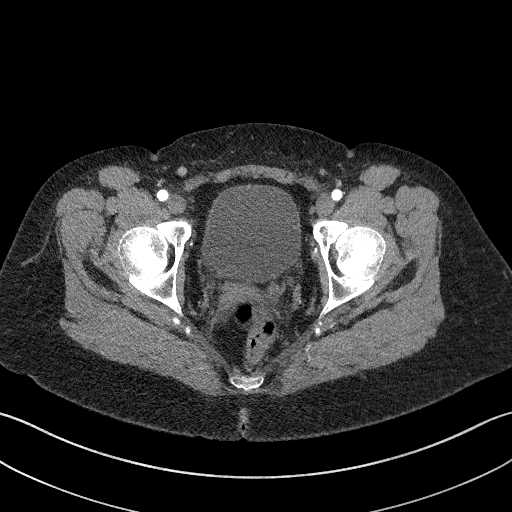
[im 63/199  soft-tissue]
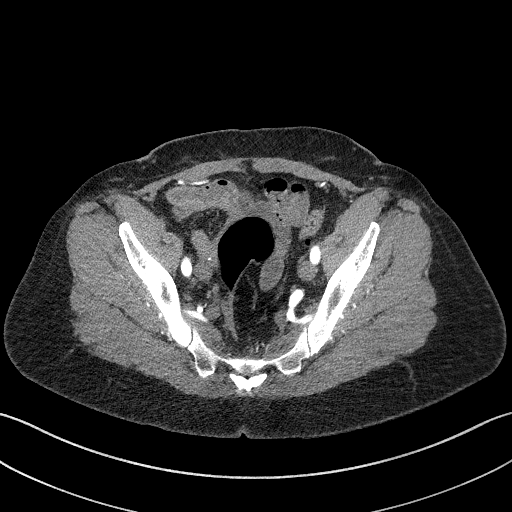

[Series 7: cor art · coronal · 0.65mm/px · 2 of 131 slices shown, 3 images]
[im 44/131  soft-tissue]
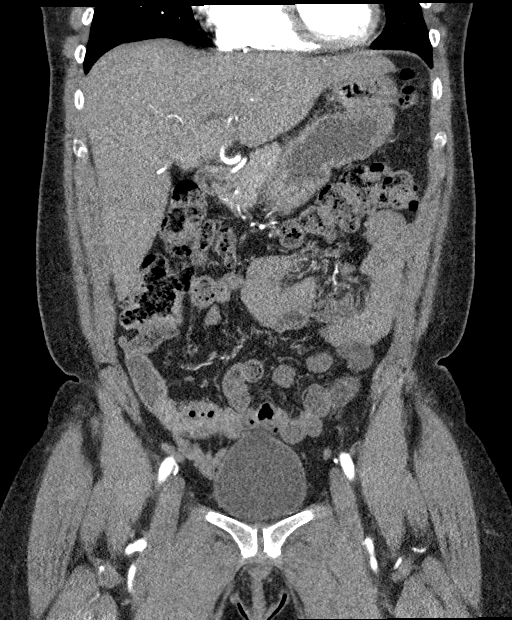
[im 44/131  bone]
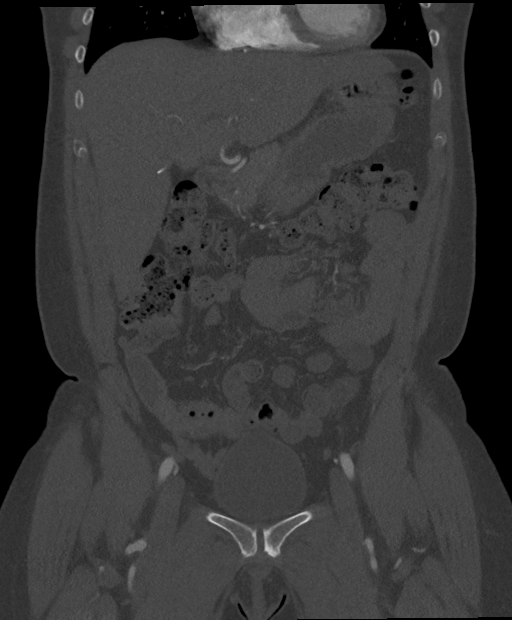
[im 87/131  soft-tissue]
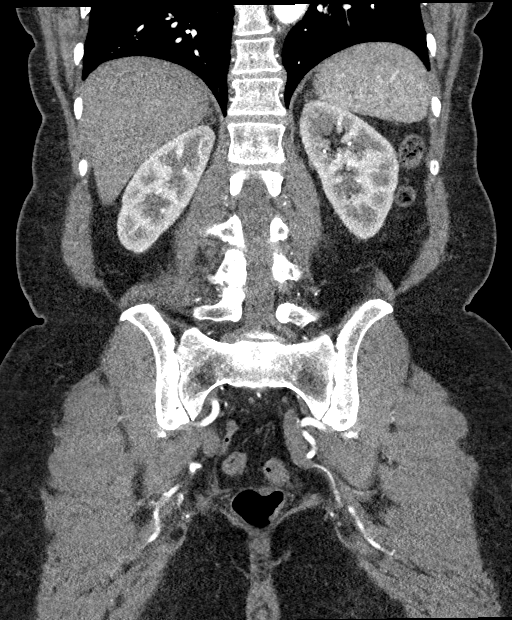

[Series 11: portal venous · axial · portal-venous · 0.73mm/px · z∈[-668,-388]mm · 6 of 80 slices shown, 11 images]
[im 12/80  soft-tissue]
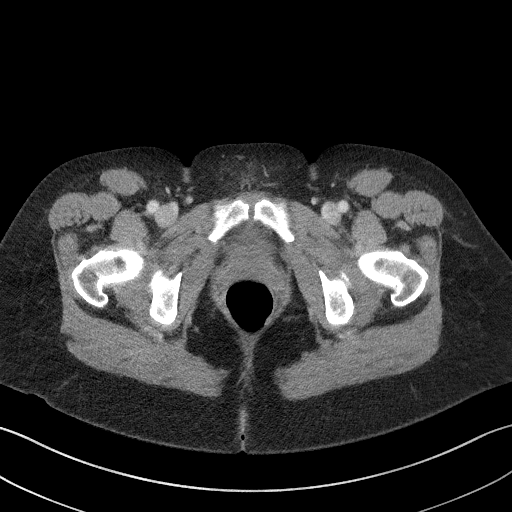
[im 12/80  bone]
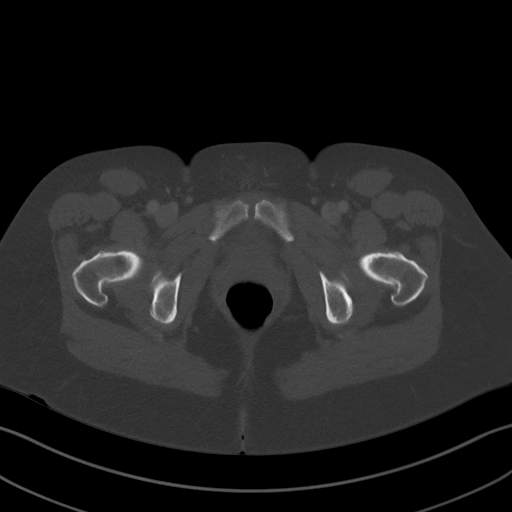
[im 23/80  soft-tissue]
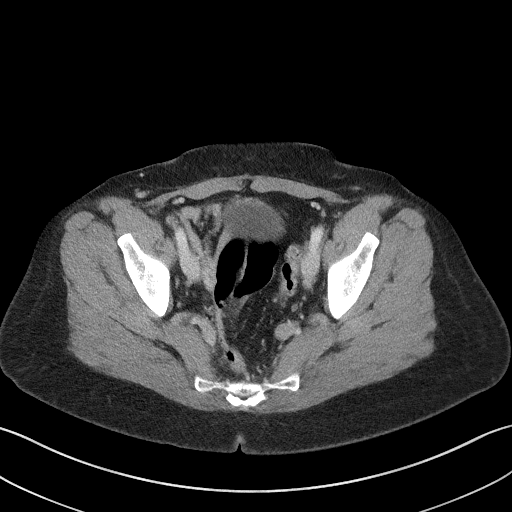
[im 34/80  soft-tissue]
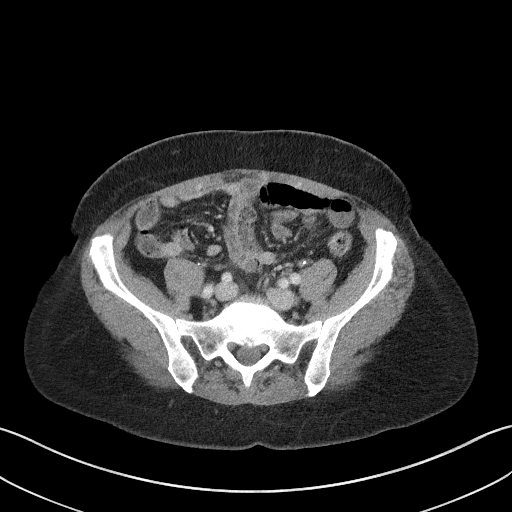
[im 34/80  lung]
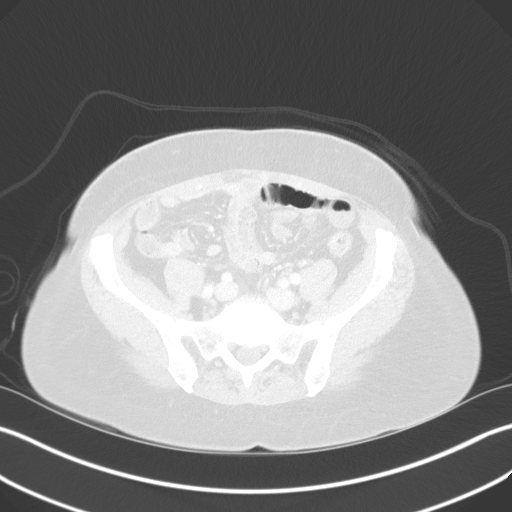
[im 46/80  soft-tissue]
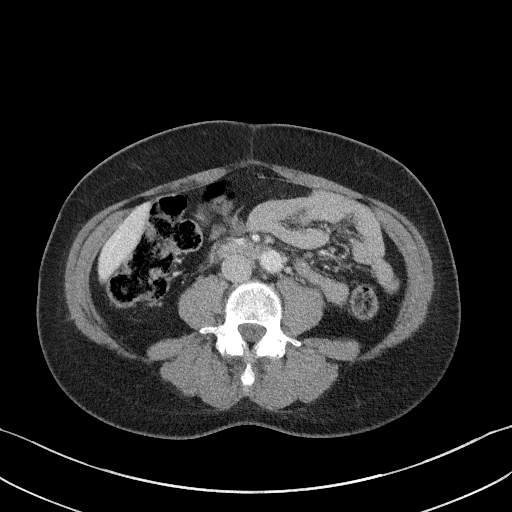
[im 46/80  lung]
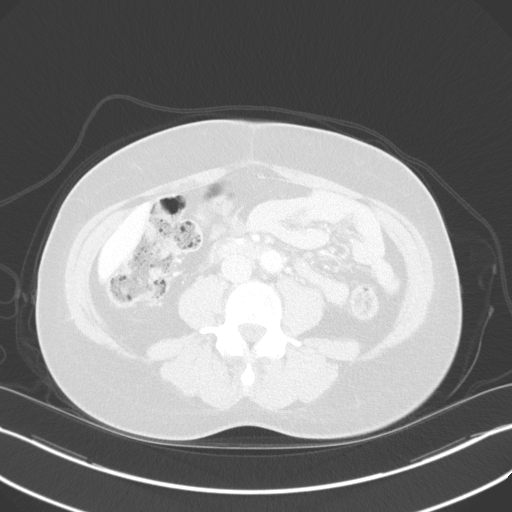
[im 57/80  soft-tissue]
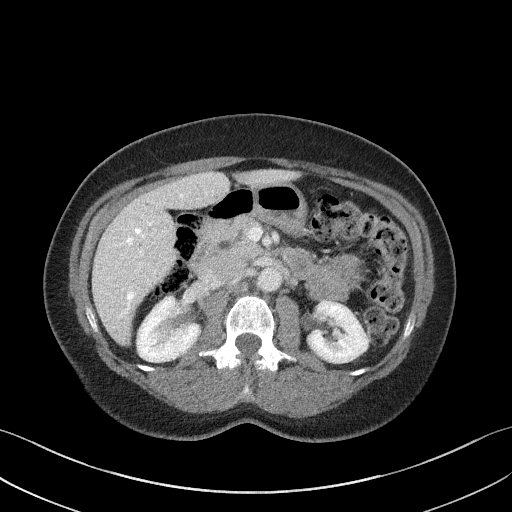
[im 57/80  lung]
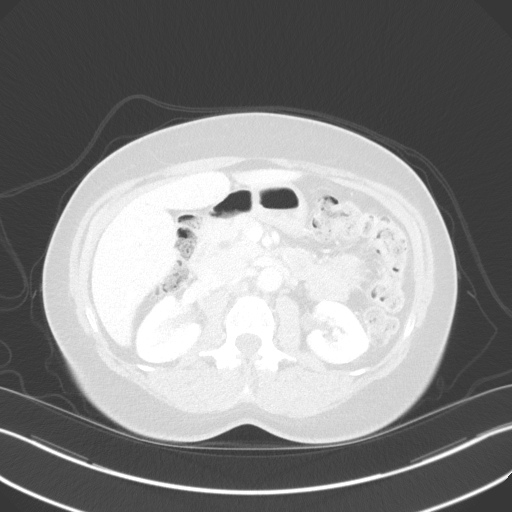
[im 68/80  soft-tissue]
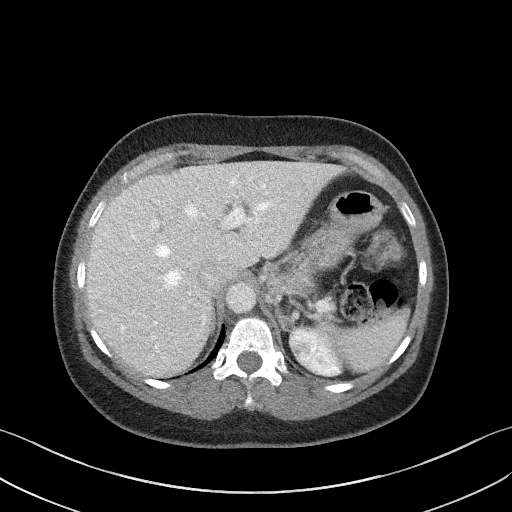
[im 68/80  lung]
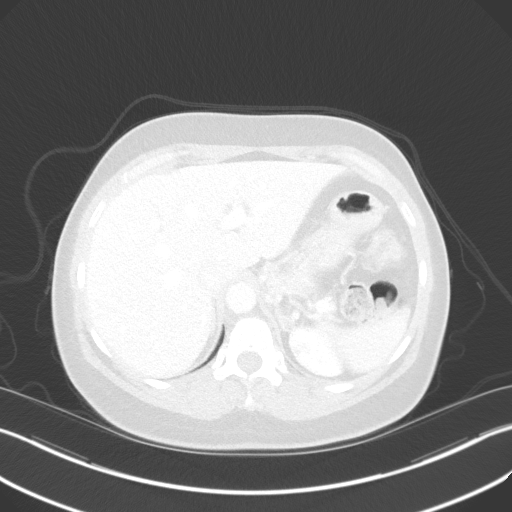

[11 of 46 positions shown; findings below may reference images not displayed]

RADIATION DOSE REDUCTION: This exam was performed according to the
departmental dose-optimization program which includes automated
exposure control, adjustment of the mA and/or kV according to
patient size and/or use of iterative reconstruction technique.

CONTRAST:  75 mL OMNIPAQUE IOHEXOL 350 MG/ML SOLN
FINDINGS: VASCULAR

Aorta: Normal caliber aorta without aneurysm, dissection, vasculitis
or significant stenosis.

Celiac: Patent without evidence of aneurysm, dissection, vasculitis
or significant stenosis.

SMA: Patent without evidence of aneurysm, dissection, vasculitis or
significant stenosis.

Renals: Both renal arteries are patent without evidence of aneurysm,
dissection, vasculitis, fibromuscular dysplasia or significant
stenosis.

IMA: Patent without evidence of aneurysm, dissection, vasculitis or
significant stenosis.

Inflow: Patent without evidence of aneurysm, dissection, vasculitis
or significant stenosis.

Proximal Outflow: Bilateral common femoral and visualized portions
of the superficial and profunda femoral arteries are patent without
evidence of aneurysm, dissection, vasculitis or significant
stenosis.

Veins: No obvious venous abnormality within the limitations of this
arterial phase study.

Review of the MIP images confirms the above findings.

NON-VASCULAR

Lower chest: No acute abnormality.

Hepatobiliary: No focal liver abnormality is seen. Status post
cholecystectomy. No biliary dilatation.

Pancreas: Unremarkable. No pancreatic ductal dilatation or
surrounding inflammatory changes.

Spleen: Normal in size without focal abnormality.

Adrenals/Urinary Tract: Adrenal glands are normal. 1.6 cm simple
cyst present in the upper pole of the left kidney. Kidneys, ureters,
and bladder otherwise normal.

Stomach/Bowel: Stomach is within normal limits. Appendix appears
normal. No evidence of bowel wall thickening, distention, or
inflammatory changes.

Lymphatic: No enlarged abdominal or pelvic lymph nodes.

Reproductive: Status post hysterectomy. No adnexal masses.

Other: No abdominal wall hernia or abnormality. No abdominopelvic
ascites.

Musculoskeletal: No acute or significant osseous findings.
IMPRESSION: VASCULAR

No significant abnormality of the mesenteric arteries or veins.

NON-VASCULAR

No acute abnormality of the abdomen or pelvis.

## 2022-08-06 ENCOUNTER — Ambulatory Visit (INDEPENDENT_AMBULATORY_CARE_PROVIDER_SITE_OTHER): Payer: Commercial Managed Care - HMO | Admitting: Allergy and Immunology

## 2022-08-06 VITALS — BP 118/72 | HR 72 | Temp 98.1°F | Resp 16 | Ht 60.0 in | Wt 142.8 lb

## 2022-08-06 DIAGNOSIS — J383 Other diseases of vocal cords: Secondary | ICD-10-CM

## 2022-08-06 DIAGNOSIS — J4550 Severe persistent asthma, uncomplicated: Secondary | ICD-10-CM

## 2022-08-06 DIAGNOSIS — T781XXD Other adverse food reactions, not elsewhere classified, subsequent encounter: Secondary | ICD-10-CM

## 2022-08-06 DIAGNOSIS — J3089 Other allergic rhinitis: Secondary | ICD-10-CM

## 2022-08-06 DIAGNOSIS — K219 Gastro-esophageal reflux disease without esophagitis: Secondary | ICD-10-CM

## 2022-08-06 DIAGNOSIS — J301 Allergic rhinitis due to pollen: Secondary | ICD-10-CM

## 2022-08-06 DIAGNOSIS — D7219 Other eosinophilia: Secondary | ICD-10-CM

## 2022-08-06 NOTE — Patient Instructions (Signed)
  1.  Allergen avoidance measures - dust mite, pollens (foods?)  2.  Treat and prevent inflammation:  A. Breztri - 2 inhalations 1-2 times per day w/spacer (empty lungs) B. Flonase - 1 spray each nostril 1-2 times per day  3.  Treat and prevent LPR:  A. Minimize caffeine consumption B. Nexium 40 mg - 1 tablet 1-2 times per day C. Famotidine 40 mg - 1 tablet in evening D. Replace all throat clearing with drinking/swallowing maneuver  4.  If needed:  A. Albuterol HFA 2 inhalations or nebulizer every 4-6 hours B. Cetirizine 10 mg - 1 tablet 1 time per day C. Epi-Pen, benadryl, MD/ER evaluation for allergic reaction  5.  Return to clinic in December 2023 or earlier if problem

## 2022-08-06 NOTE — Progress Notes (Unsigned)
[facility] [place] [facility] [place]   Follow-up Note  Referring Provider: Burnis Medin, MD Primary Provider: Burnis Medin, MD Date of Office Visit: 08/06/2022  Subjective:   [name] (DOB: 1966-02-17) is a 56 y.o. female who returns to the [name] on 08/06/2022 in re-evaluation of the following:  HPI: [name] returns to this clinic in evaluation of asthma and allergic rhinitis and possible food allergy and LPR and vocal cord dysfunction and eosinophilia.  I last saw her in this clinic on 04 June 2022.  She has really done well with her airway and has eliminated all of her shortness of breath and coughing and throat clearing and need to clear out her throat and stuff stuck in her throat and she is resolved all of her reflux issues on her current plan.  She has no need to use a short acting bronchodilator at this point in time in concert exert herself with no problem.  She is decreased her coffee approximately 50%.  Current she is using Breztri once a day, Flonase once a day, Nexium once a day, famotidine once a day.  Allergies as of 08/06/2022       Reactions   [name] [fexofenadine] Hives   [name] Hives   REACTION: unspecified   [name] [valsartan-hydrochlorothiazide] Swelling   Had facial swelling and throat itching after 2 doses of the generic had done well on the brand medicine for a while.  See ED visit February 2014   [name], Sterile] Anaphylaxis   Chocolate Other (See Comments)   triggers Asthma   Citric Acid Other (See Comments)   REACTION: swelling, hives, itching   Coly-mycin S Other (See Comments)   REACTION: pt reports she passed out   Peanut-containing Drug Products Swelling   Valsartan Swelling   Amlodipine Other (See Comments)   At 5 mg dose; swelling        Medication List    albuterol (2.5 MG/3ML) 0.083% nebulizer solution Commonly known as: PROVENTIL Inhale 3 mLs (2.5 mg total) by  nebulization every 6 (six) hours as needed for wheezing or shortness of breath.   albuterol 108 (90 Base) MCG/ACT inhaler Commonly known as: VENTOLIN HFA Inhale 2 puffs into the lungs every 4 (four) hours as needed.   Breztri Aerosphere 160-9-4.8 MCG/ACT Aero Generic drug: Budeson-Glycopyrrol-Formoterol Inhale 2 puffs into the lungs in the morning and at bedtime.   cetirizine 10 MG tablet Commonly known as: ZYRTEC Take 1 tablet (10 mg total) by mouth daily as needed for allergies (Can take an extra dose during flare ups.).   EPINEPHrine 0.3 mg/0.3 mL Soaj injection Commonly known as: EPI-PEN Inject 0.3 mg into the muscle as needed for anaphylaxis.   esomeprazole 40 MG capsule Commonly known as: NEXIUM Take 1 capsule (40 mg total) by mouth 2 (two) times daily. What changed: when to take this   famotidine 40 MG tablet Commonly known as: PEPCID Take 1 tablet (40 mg total) by mouth daily.   fluticasone 50 MCG/ACT nasal spray Commonly known as: FLONASE USE 2 SPRAYS IN EACH NOSTRIL DAILY   hydrochlorothiazide 25 MG tablet Commonly known as: HYDRODIURIL TAKE 1 TABLET (25 MG TOTAL) BY MOUTH DAILY.   triamcinolone cream 0.1 % Commonly known as: KENALOG Apply 1 Application topically 2 (two) times daily.   valsartan 320 MG tablet Commonly known as: Jordan Take 0.5 tablets (160 mg total) by mouth daily.   VITAMIN D PO Take  1 capsule by mouth daily.    Past Medical History:  Diagnosis Date   Allergic reaction caused by a drug 01/25/2013   probably.    Allergy    Anemia    Arthritis    Asthma    GERD (gastroesophageal reflux disease)    Heart murmur    Hypertension    Positive TB test    Shingles    TOBACCO USE 03/22/2009   Qualifier: Diagnosis of  By: Regis Bill MD, [name]  Stopped Dec 12 2010      Past Surgical History:  Procedure Laterality Date   ABDOMINAL HYSTERECTOMY     BREAST SURGERY     cyst from left breast   BUNIONECTOMY     both feet   CHOLECYSTECTOMY      CYSTECTOMY     left shoulder   KNEE SURGERY Left    PANENDOSCOPY     TONSILLECTOMY     TUBAL LIGATION     uterine ablastion     and polyps removed   Uterine polyps      Review of systems negative except as noted in HPI / PMHx or noted below:  Review of Systems  Constitutional: Negative.   HENT: Negative.    Eyes: Negative.   Respiratory: Negative.    Cardiovascular: Negative.   Gastrointestinal: Negative.   Genitourinary: Negative.   Musculoskeletal: Negative.   Skin: Negative.   Neurological: Negative.   Endo/Heme/Allergies: Negative.   Psychiatric/Behavioral: Negative.       Objective:   Vitals:   08/06/22 1542  BP: 118/72  Pulse: 72  Resp: 16  Temp: 98.1 F (36.7 C)  SpO2: 98%   Height: 5' (152.4 cm)  Weight: 142 lb 12.8 oz (64.8 kg)   Physical Exam Constitutional:      Appearance: She is not diaphoretic.  HENT:     Head: Normocephalic.     Right Ear: Tympanic membrane, ear canal and external ear normal.     Left Ear: Tympanic membrane, ear canal and external ear normal.     Nose: Nose normal. No mucosal edema or rhinorrhea.     Mouth/Throat:     Pharynx: Uvula midline. No oropharyngeal exudate.  Eyes:     Conjunctiva/sclera: Conjunctivae normal.  Neck:     Thyroid: No thyromegaly.     Trachea: Trachea normal. No tracheal tenderness or tracheal deviation.  Cardiovascular:     Rate and Rhythm: Normal rate and regular rhythm.     Heart sounds: Normal heart sounds, S1 normal and S2 normal. No murmur heard. Pulmonary:     Effort: No respiratory distress.     Breath sounds: Normal breath sounds. No stridor. No wheezing or rales.  Lymphadenopathy:     Head:     Right side of head: No tonsillar adenopathy.     Left side of head: No tonsillar adenopathy.     Cervical: No cervical adenopathy.  Skin:    Findings: No erythema or rash.     Nails: There is no clubbing.  Neurological:     Mental Status: She is alert.     Diagnostics:     Spirometry was performed and demonstrated an FEV1 of 1.42 at 77 % of predicted.  Results of blood tests obtained 04 June 2022 identified no IgE antibodies directed against tree nuts, peanuts, citrus foods, papaya, mango, coconut.  Assessment and Plan:   1. Asthma, severe persistent, well-controlled   2. Perennial allergic rhinitis   3. Seasonal allergic rhinitis due  to pollen   4. LPRD (laryngopharyngeal reflux disease)   5. Vocal cord dysfunction   6. Other eosinophilia   7. Adverse food reaction, subsequent encounter    1.  Allergen avoidance measures - dust mite, pollens (foods?)  2.  Treat and prevent inflammation:  A. Breztri - 2 inhalations 1-2 times per day w/spacer (empty lungs) B. Flonase - 1 spray each nostril 1-2 times per day  3.  Treat and prevent LPR:  A. Minimize caffeine consumption B. Nexium 40 mg - 1 tablet 1-2 times per day C. Famotidine 40 mg - 1 tablet in evening D. Replace all throat clearing with drinking/swallowing maneuver  4.  If needed:  A. Albuterol HFA 2 inhalations or nebulizer every 4-6 hours B. Cetirizine 10 mg - 1 tablet 1 time per day C. Epi-Pen, benadryl, MD/ER evaluation for allergic reaction  5.  Return to clinic in December 2023 or earlier if problem  [name] is doing much better at this point in time on her anti-inflammatory agents for her airway use consistently and aggressively treating her LPR.  I would like to have her remain on this plan we will see what happens over the course the next several months.  Should she not do well with this plan that she always has the option of starting immunotherapy or a biologic agent given her pre-existing eosinophilia.  I did inform her that all of her blood test directed against possible food allergens are negative.  She feels more secure just remaining away from these for this point in time  Jordan Katz, MD Allergy / [place]

## 2022-08-07 ENCOUNTER — Encounter: Payer: Self-pay | Admitting: Allergy and Immunology

## 2022-08-20 ENCOUNTER — Telehealth: Payer: Self-pay

## 2022-08-20 NOTE — Telephone Encounter (Signed)
Patient called due to being close to out of her breztri inhaler sample . When she went to get a new one from the pharmacy she said it would cost her $559.00.   Patient came and picked up a sample from the [facility] office. I will try and see if I can get it covered through a PA since patient tried and failed Symbicort. She was constantly having asthma flares and the breztri has been helping with her asthma since the change. If it's denied we can try AZ&ME

## 2022-09-12 ENCOUNTER — Telehealth: Payer: Self-pay | Admitting: Allergy and Immunology

## 2022-09-12 DIAGNOSIS — J4550 Severe persistent asthma, uncomplicated: Secondary | ICD-10-CM

## 2022-09-12 NOTE — Telephone Encounter (Signed)
Called patient - DOB verified - advised Breztri sample would be up front ready for pick up. Patient advised she has received the AZ&ME application - will complete it in it's entirety and bring with her when she picks up the sample.   Reviewed AZ&ME application process with patient - can stay updated via myChart regarding status application once it has been faxed.  Reviewed directions/instructions (dose, frequency) for [facility] with patient.  Patient verbalized understanding, no further questions.  I did a Breztri PA as well due to patient having new insurance...  KEYOtilio Jefferson PA Case ID: [id number] created: 09/12/22  PA has been sent to [facility] for review.  Turnaround time is within 3-5 day (72-120 hours). If no response in that time frame Christella Scheuermann can be contacted at [phone].  Forwarding message to provider as update.

## 2022-09-12 NOTE — Telephone Encounter (Signed)
Patient states she received a call about AZ&Me paperwork for [employer] Patient states she filled out paperwork and tried to upload it in [place] but couldn't. Patient asked for an email to send paperwork to. I provided her with [email or web address].   Patient also requested another breztri sample as she is almost out and is not sure how long it'll take for application to be approved/denied.  Patient states if she is able to get sample she will pick one up today after work and drop off paperwork while here.   Best contact number: [phone]

## 2022-09-13 NOTE — Telephone Encounter (Signed)
Received a fax from [place] regarding Prior Authorization for Breztri aerosphere 160-9-4.8 HFA AER AD.   Authorization has been DENIED due to There is no indication your patient has met both of the following: 1. Failure/inadequate response with Anoro Ellipta therapy (meaning patient requires add-on therapy with inhaled corticosteroids); 2. Inability to use Anoro Ellipta in combination with each preferred inhaled corticosteroid therapy*. "Budesonide, Arnuity Ellipta, Flovent Diskus/HFA, and Asmanex/HFA**. **Prior authorization or step therapy may apply Judithann Sauger Aerosphere is considered medically necessary when both of the following: 1. Failure/inadequate response to [employer] (requiring continued add-on therapy with inhaled corticosteroids); 2. Inability to use Anoro Ellipta in combination with all of the following inhaled corticosteroid therapies: budesonide, Arnuity Ellipta, Flovent Diskus/HFA, and Asmanex/HFA*.  ** may require prior authorization.  Determination letter attached to chart.

## 2022-09-16 NOTE — Telephone Encounter (Signed)
Per  provider - He is have her start trelegy 100-1 inhalation 1 time per day to replace her Judithann Sauger which is not going to be approved by her insurance company.  Please inform [place]   Called patient - DOB/Pharmacy - advised of provider notation.   Patient verbalized understanding - will make sure to keep [place] updated on how she's doing with new inhaler.   S[facility] per patient request.    Trelegy 100 is not on insurance formulary - not covered.  Options: Arnuity Ellipta 100, 200, 50               Flovent Diskus: 50, 100 250               Flovent HFA: 44, 110, 220  I did not send in new prescription - forwarding message to provider as update.

## 2022-09-17 ENCOUNTER — Encounter: Payer: Self-pay | Admitting: Allergy and Immunology

## 2022-09-17 MED ORDER — BREZTRI AEROSPHERE 160-9-4.8 MCG/ACT IN AERO: 2.0000 | INHALATION_SPRAY | Freq: Two times a day (BID) | RESPIRATORY_TRACT | 5 refills | Status: DC

## 2022-09-17 NOTE — Addendum Note (Signed)
Addended by: Tommas Olp B on: 09/17/2022 07:25 PM   Modules accepted: Orders

## 2022-09-17 NOTE — Telephone Encounter (Signed)
Per patient request via myChart - sending Breztri medication refill to [facility].

## 2022-09-17 NOTE — Telephone Encounter (Signed)
Tried calling pharmacy to get medication transferred over to requested pharmacy. The pharmacy is currently closed. Marland KitchenSpoke to patient and advised her that the [facility] [address] and to call tomorrow and let them know that she wants to get script transferred over to the other pharmacy.

## 2022-09-17 NOTE — Telephone Encounter (Addendum)
Called [employer]/[phone] - spoke to Tracey,CSR - DOB verified - advised per provider patient needs a combination inhaler. Per provider patient can not use alternative inhalers listed on denial letter.  Per Linus Orn, CSR - resubmitted [place] PA: PA Case ID: [id number] approved 09/17/22 - 09/17/2023. Tracey advised approval letter would be faxed as well.  Called patient - DPR verified - LMOVM of the above notation. Patient was advised to contact office to advise where prescription should be electronically sent.  Patient's completed AZ&ME application was not needed - will send to Bulk mail to be scanned - in case it's needed in the future.   Provider has been notified as well.

## 2022-09-18 ENCOUNTER — Telehealth (INDEPENDENT_AMBULATORY_CARE_PROVIDER_SITE_OTHER): Payer: BC Managed Care – PPO | Admitting: Internal Medicine

## 2022-09-18 ENCOUNTER — Encounter: Payer: Self-pay | Admitting: Internal Medicine

## 2022-09-18 VITALS — Ht 60.0 in | Wt 146.0 lb

## 2022-09-18 DIAGNOSIS — R1013 Epigastric pain: Secondary | ICD-10-CM | POA: Diagnosis not present

## 2022-09-18 NOTE — Progress Notes (Signed)
Virtual Visit via Video Note  I connected with [name] on 09/18/22 at 11:30 AM EST by a video enabled telemedicine application and verified that I am speaking with the correct person using two identifiers. Location patient vehicle Location provider:work office Persons participating in the virtual visit: patient, provider  Patient aware  of the limitations of evaluation and management by telemedicine and  availability of in person appointments. and agreed to proceed.   HPI: [name] presents for video visit    with about 2 weeks of upper abdominal pain after eating maybe 15 to 20 minutes and some loose stools but no vomiting fever some fatigue.  No one else is sick it reminiscent of her previous pain evaluated about 6 months ago.  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  She tried dicyclomine that was left over after she ate and she thinks it made it worse.  She is able to tolerate boiled egg and toast and fluids. She even did a COVID test which was negative has tried some Gaviscon  She had 1 episode awaken her from sleep.  Not sure what to do next. She was on a probiotic through the summer which seem to help when she went off of it may be coming back. Her insurance had lapsed in between jobs she has a new job since September.  Which is days and a lot less stress   ROS: See pertinent positives and negatives per HPI.  Past Medical History:  Diagnosis Date   Allergic reaction caused by a drug 01/25/2013   probably.    Allergy    Anemia    Arthritis    Asthma    GERD (gastroesophageal reflux disease)    Heart murmur    Hypertension    Positive TB test    Shingles    TOBACCO USE 03/22/2009   Qualifier: Diagnosis of  By: Regis Bill MD, [name]  Stopped Dec 12 2010      Past Surgical History:  Procedure Laterality Date   ABDOMINAL HYSTERECTOMY     BREAST SURGERY     cyst from left breast    BUNIONECTOMY     both feet   CHOLECYSTECTOMY     CYSTECTOMY     left shoulder   KNEE SURGERY Left    PANENDOSCOPY     TONSILLECTOMY     TUBAL LIGATION     uterine ablastion     and polyps removed   Uterine polyps      Family History  Problem Relation Age of Onset   Hypertension Mother    Alzheimer's disease Mother    Hypertension Father    Prostate cancer Father    HIV Brother    Lupus Sister    Colon cancer Paternal Grandmother    Esophageal cancer Neg Hx    Liver cancer Neg Hx    Pancreatic cancer Neg Hx    Rectal cancer Neg Hx    Stomach cancer Neg Hx     Social History   Tobacco Use   Smoking status: Former    Packs/day: 0.50    Years: 17.00    Total pack years: 8.50    Types: Cigarettes    Quit date: 12/12/2010    Years since quitting: 11.7   Smokeless tobacco: Never  Vaping Use   Vaping Use: Never used  Substance Use Topics   Alcohol use: No  Drug use: No      Current Outpatient Medications:    albuterol (PROVENTIL) (2.5 MG/3ML) 0.083% nebulizer solution, Inhale 3 mLs (2.5 mg total) by nebulization every 6 (six) hours as needed for wheezing or shortness of breath., Disp: 125 mL, Rfl: 12   albuterol (VENTOLIN HFA) 108 (90 Base) MCG/ACT inhaler, Inhale 2 puffs into the lungs every 4 (four) hours as needed., Disp: 24 g, Rfl: 3   BREZTRI AEROSPHERE 160-9-4.8 MCG/ACT AERO, Inhale 2 puffs into the lungs in the morning and at bedtime., Disp: 11 g, Rfl: 5   cetirizine (ZYRTEC) 10 MG tablet, Take 1 tablet (10 mg total) by mouth daily as needed for allergies (Can take an extra dose during flare ups.)., Disp: 60 tablet, Rfl: 5   Cyanocobalamin (VITAMIN B-12 PO), Take by mouth daily., Disp: , Rfl:    EPINEPHrine 0.3 mg/0.3 mL IJ SOAJ injection, Inject 0.3 mg into the muscle as needed for anaphylaxis., Disp: 2 each, Rfl: 1   esomeprazole (NEXIUM) 40 MG capsule, Take 1 capsule (40 mg total) by mouth 2 (two) times daily. (Patient taking differently: Take 40 mg by  mouth daily.), Disp: 60 capsule, Rfl: 5   famotidine (PEPCID) 40 MG tablet, Take 1 tablet (40 mg total) by mouth daily., Disp: 30 tablet, Rfl: 5   fluticasone (FLONASE) 50 MCG/ACT nasal spray, USE 2 SPRAYS IN EACH NOSTRIL DAILY, Disp: 48 g, Rfl: 2   hydrochlorothiazide (HYDRODIURIL) 25 MG tablet, TAKE 1 TABLET (25 MG TOTAL) BY MOUTH DAILY., Disp: 90 tablet, Rfl: 1   triamcinolone cream (KENALOG) 0.1 %, Apply 1 Application topically 2 (two) times daily., Disp: 30 g, Rfl: 1   valsartan (DIOVAN) 320 MG tablet, Take 0.5 tablets (160 mg total) by mouth daily., Disp: 45 tablet, Rfl: 1   Zinc 25 MG TABS, Take by mouth daily., Disp: , Rfl:    VITAMIN D PO, Take 1 capsule by mouth daily. (Patient not taking: Reported on 09/18/2022), Disp: , Rfl:   EXAM: BP Readings from Last 3 Encounters:  08/06/22 118/72  06/10/22 118/82  06/04/22 118/78    VITALS per patient if applicable:  GENERAL: alert, oriented, appears well and in no acute distress  HEENT: atraumatic, conjunttiva clear, no obvious abnormalities on inspection of external nose and ears  NECK: normal movements of the head and neck  LUNGS: on inspection no signs of respiratory distress, breathing rate appears normal, no obvious gross SOB, gasping or wheezing  CV: no obvious cyanosis  MS: moves all visible extremities without noticeable abnormality  PSYCH/NEURO: pleasant and cooperative, no obvious depression or anxiety, speech and thought processing grossly intact  Record review endoscopy report notes and records regarding episodic abdominal pain ASSESSMENT AND PLAN:  Discussed the following assessment and plan:    ICD-10-CM   1. Epigastric pain  R10.13     2. Epigastric abdominal pain post prandial  R10.13      Try the bentyl pre eating and add liquid antacid ( like a goi cocktail)  for now. Ok to restart probiotic if can afford and helped in past . Get appointment or fu with the GI team( had lost insurnace in between jobs )   The pain is high epigastric is wonder if it is related to esophageal valve mild stricture or spasm. Does not seem like infection. Counseled.   Expectant management and discussion of plan and treatment with opportunity to ask questions and all were answered. The patient agreed with the plan and demonstrated an understanding of  the instructions.   Advised to call back or seek an in-person evaluation if worsening  or having  further concerns  in interim. Return for FU with GI team .    Shanon Ace, MD

## 2022-10-03 ENCOUNTER — Encounter (HOSPITAL_BASED_OUTPATIENT_CLINIC_OR_DEPARTMENT_OTHER): Payer: Self-pay | Admitting: Urology

## 2022-10-03 ENCOUNTER — Emergency Department (HOSPITAL_BASED_OUTPATIENT_CLINIC_OR_DEPARTMENT_OTHER): Payer: Self-pay

## 2022-10-03 ENCOUNTER — Emergency Department (HOSPITAL_BASED_OUTPATIENT_CLINIC_OR_DEPARTMENT_OTHER)
Admission: EM | Admit: 2022-10-03 | Discharge: 2022-10-03 | Disposition: A | Discharge status: Home / Self Care | Payer: Self-pay | Attending: Emergency Medicine | Admitting: Emergency Medicine

## 2022-10-03 ENCOUNTER — Other Ambulatory Visit: Payer: Self-pay

## 2022-10-03 DIAGNOSIS — J101 Influenza due to other identified influenza virus with other respiratory manifestations: Secondary | ICD-10-CM | POA: Insufficient documentation

## 2022-10-03 DIAGNOSIS — J029 Acute pharyngitis, unspecified: Secondary | ICD-10-CM | POA: Diagnosis present

## 2022-10-03 DIAGNOSIS — Z1152 Encounter for screening for COVID-19: Secondary | ICD-10-CM | POA: Insufficient documentation

## 2022-10-03 DIAGNOSIS — Z91010 Allergy to peanuts: Secondary | ICD-10-CM | POA: Diagnosis not present

## 2022-10-03 DIAGNOSIS — J111 Influenza due to unidentified influenza virus with other respiratory manifestations: Secondary | ICD-10-CM

## 2022-10-03 LAB — RESP PANEL BY RT-PCR (FLU A&B, COVID) ARPGX2
Influenza A by PCR: NEGATIVE
Influenza B by PCR: NEGATIVE
SARS Coronavirus 2 by RT PCR: NEGATIVE

## 2022-10-03 MED ORDER — BENZONATATE 100 MG PO CAPS: 100.0000 mg | ORAL_CAPSULE | Freq: Three times a day (TID) | ORAL | 0 refills | Status: DC

## 2022-10-03 MED ORDER — BENZONATATE 100 MG PO CAPS
100.0000 mg | ORAL_CAPSULE | Freq: Once | ORAL | Status: AC
  Administered 2022-10-03: 100 mg via ORAL
  Filled 2022-10-03: qty 1

## 2022-10-03 MED ORDER — PREDNISONE 20 MG PO TABS: 40.0000 mg | ORAL_TABLET | Freq: Every day | ORAL | 0 refills | Status: DC

## 2022-10-03 MED ORDER — KETOROLAC TROMETHAMINE 15 MG/ML IJ SOLN
15.0000 mg | Freq: Once | INTRAMUSCULAR | Status: AC
  Administered 2022-10-03: 15 mg via INTRAMUSCULAR
  Filled 2022-10-03: qty 1

## 2022-10-03 NOTE — ED Provider Notes (Signed)
[facility] EMERGENCY DEPARTMENT Provider Note   CSN: [id number] Arrival date & time: 10/03/22  1344     History  Chief Complaint  Patient presents with   Flu like Symptoms    [name] is a 56 y.o. female.  Patient presents for evaluation of flulike illness, starting 2 days ago in the afternoon.  Patient has a history of asthma and has been using her inhaler slightly more frequently.  Her symptoms include headache, ear pressure, sore throat, body aches, fever low-grade at home, cough.  She has been taking ibuprofen.  She did vomit "phlegm" last night.  No chest pain or shortness of breath.  No abdominal pain or urinary symptoms.       Home Medications Prior to Admission medications   Medication Sig Start Date End Date Taking? Authorizing Provider  benzonatate (TESSALON) 100 MG capsule Take 1 capsule (100 mg total) by mouth every 8 (eight) hours. 10/03/22  Yes Carlisle Cater, PA-C  predniSONE (DELTASONE) 20 MG tablet Take 2 tablets (40 mg total) by mouth daily. 10/03/22  Yes Carlisle Cater, PA-C  albuterol (PROVENTIL) (2.5 MG/3ML) 0.083% nebulizer solution Inhale 3 mLs (2.5 mg total) by nebulization every 6 (six) hours as needed for wheezing or shortness of breath. 06/01/21   [name], MD  albuterol (VENTOLIN HFA) 108 (90 Base) MCG/ACT inhaler Inhale 2 puffs into the lungs every 4 (four) hours as needed. 07/25/22   [name], MD  BREZTRI AEROSPHERE 160-9-4.8 MCG/ACT AERO Inhale 2 puffs into the lungs in the morning and at bedtime. 09/17/22   Kozlow, Donnamarie Poag, MD  cetirizine (ZYRTEC) 10 MG tablet Take 1 tablet (10 mg total) by mouth daily as needed for allergies (Can take an extra dose during flare ups.). 06/04/22   Kozlow, Donnamarie Poag, MD  Cyanocobalamin (VITAMIN B-12 PO) Take by mouth daily.    [provider]  EPINEPHrine 0.3 mg/0.3 mL IJ SOAJ injection Inject 0.3 mg into the muscle as needed for anaphylaxis. 06/04/22   Kozlow, Donnamarie Poag, MD  esomeprazole  (NEXIUM) 40 MG capsule Take 1 capsule (40 mg total) by mouth 2 (two) times daily. Patient taking differently: Take 40 mg by mouth daily. 06/04/22   Kozlow, Donnamarie Poag, MD  famotidine (PEPCID) 40 MG tablet Take 1 tablet (40 mg total) by mouth daily. 06/04/22   Kozlow, Donnamarie Poag, MD  fluticasone (FLONASE) 50 MCG/ACT nasal spray USE 2 SPRAYS IN EACH NOSTRIL DAILY 04/13/21   [name], [name], MD  hydrochlorothiazide (HYDRODIURIL) 25 MG tablet TAKE 1 TABLET (25 MG TOTAL) BY MOUTH DAILY. 07/19/22   [name], [name], MD  triamcinolone cream (KENALOG) 0.1 % Apply 1 Application topically 2 (two) times daily. 05/01/22   Burchette, Alinda Sierras, MD  valsartan (DIOVAN) 320 MG tablet Take 0.5 tablets (160 mg total) by mouth daily. 02/07/22   [name], [name], MD  Zinc 25 MG TABS Take by mouth daily.    [provider]      Allergies    Allegra [fexofenadine]; Cimetidine; Diovan hct [valsartan-hydrochlorothiazide]; Purified water [water, sterile]; Chocolate; Citric acid; Coly-mycin s; Peanut-containing drug products; Valsartan; and Amlodipine    Review of Systems   Review of Systems  Physical Exam Updated Vital Signs BP (!) 138/98 (BP Location: Left Arm)   Pulse (!) 104   Temp 98.8 F (37.1 C) (Oral)   Resp 18   Ht 5' (1.524 m)   Wt 66.2 kg   SpO2 95%   BMI 28.51 kg/m  Physical Exam Vitals and nursing note reviewed.  Constitutional:      Appearance: She is well-developed.  HENT:     Head: Normocephalic and atraumatic.     Jaw: No trismus.     Right Ear: Tympanic membrane, ear canal and external ear normal.     Left Ear: Tympanic membrane, ear canal and external ear normal.     Nose: Congestion and rhinorrhea present. No mucosal edema.     Mouth/Throat:     Mouth: Mucous membranes are moist. Mucous membranes are not dry. No oral lesions.     Pharynx: Uvula midline. No oropharyngeal exudate, posterior oropharyngeal erythema or uvula swelling.     Tonsils: No tonsillar abscesses.     Comments: No exudates  or swelling Eyes:     General:        Right eye: No discharge.        Left eye: No discharge.     Conjunctiva/sclera: Conjunctivae normal.  Cardiovascular:     Rate and Rhythm: Normal rate and regular rhythm.     Heart sounds: Normal heart sounds.  Pulmonary:     Effort: Pulmonary effort is normal. No respiratory distress.     Breath sounds: Normal breath sounds. No wheezing or rales.     Comments: Frequent coughing during exam, no significant wheezing noted to auscultation Abdominal:     Palpations: Abdomen is soft.     Tenderness: There is no abdominal tenderness.  Musculoskeletal:     Cervical back: Normal range of motion and neck supple.  Lymphadenopathy:     Cervical: No cervical adenopathy.  Skin:    General: Skin is warm and dry.  Neurological:     Mental Status: She is alert.  Psychiatric:        Mood and Affect: Mood normal.     ED Results / Procedures / Treatments   Labs (all labs ordered are listed, but only abnormal results are displayed) Labs Reviewed  RESP PANEL BY RT-PCR (FLU A&B, COVID) [id number]    EKG None  Radiology DG Chest 2 View  Result Date: 10/03/2022 CLINICAL DATA:  Cough EXAM: CHEST - 2 VIEW COMPARISON:  Radiograph 03/05/2022 FINDINGS: Unchanged cardiomediastinal silhouette. There is a faint right lower lung opacity which is favored to represent overlapping bronchovascular structures. No definite airspace disease. No pleural effusion or evidence of pneumothorax. No acute osseous abnormality. IMPRESSION: Faint right lower lung opacity is favored to reflect overlapping bronchovascular structures. No definite airspace disease. Electronically Signed   By: Maurine Simmering M.D.   On: 10/03/2022 14:44    Procedures Procedures    Medications Ordered in ED Medications  ketorolac (TORADOL) 15 MG/ML injection 15 mg (has no administration in time range)  benzonatate (TESSALON) capsule 100 mg (has no administration in time range)    ED Course/ Medical  Decision Making/ A&P    Patient seen and examined. History obtained directly from patient. Work-up including labs, imaging, EKG ordered in triage, if performed, were reviewed.    Labs/EKG: Independently reviewed and interpreted.  This included: Flu and COVID testing was negative  Imaging: Independently reviewed and interpreted.  This included: Chest x-ray, agree negative  Medications/Fluids: Ordered: IM Toradol, p.o. Tessalon  Most recent vital signs reviewed and are as follows: BP (!) 138/98 (BP Location: Left Arm)   Pulse (!) 104   Temp 98.8 F (37.1 C) (Oral)   Resp 18   Ht 5' (1.524 m)   Wt 66.2 kg   SpO2 95%  BMI 28.51 kg/m   Initial impression: Viral illness in setting of asthma, negative for flu and COVID.  Home treatment plan: Rest, hydration, OTC meds, scheduled use of inhaler as needed.  Will prescribe a prescription for prednisone.  I encouraged her to fill this if she has worsening control of her asthma including chest tightness or needing inhaler more than every 4 hours.  Return instructions discussed with patient: Worsening shortness of breath, difficulty breathing, new symptoms or other concerns.  Follow-up instructions discussed with patient: Follow-up with PCP in 5 days if not improving                          Medical Decision Making Amount and/or Complexity of Data Reviewed Radiology: ordered.  Risk Prescription drug management.   Patient with symptoms consistent with influenza-like illness.  She has history of asthma which makes her high risk, however this is relatively controlled at this point.  Chest x-ray is clear of pneumonia.  Vitals are stable, no fever here. No signs of dehydration, tolerating PO's. Lungs are clear.  No current indications for antiviral or antibiotics.  Supportive therapy indicated with return if symptoms worsen. Patient counseled.         Final Clinical Impression(s) / ED Diagnoses Final diagnoses:  Influenza-like  illness    Rx / DC Orders ED Discharge Orders          Ordered    benzonatate (TESSALON) 100 MG capsule  Every 8 hours        10/03/22 1544    predniSONE (DELTASONE) 20 MG tablet  Daily        10/03/22 1544              Carlisle Cater, PA-C 10/03/22 1550    Long, Wonda Olds, MD 10/04/22 1010

## 2022-10-03 NOTE — Discharge Instructions (Signed)
Please read and follow all provided instructions.  Your diagnoses today include:  1. Influenza-like illness     Tests performed today include: Swab for flu and COVID: Were negative Chest x-ray: Was negative for pneumonia Vital signs. See below for your results today.   Medications prescribed:  Tessalon Perles - cough suppressant medication  Prednisone - steroid medicine, will this and begin taking if your symptoms get worse or you notice you are using your inhaler more than every 4 hours  It is best to take this medication in the morning to prevent sleeping problems. If you are diabetic, monitor your blood sugar closely and stop taking Prednisone if blood sugar is over 300. Take with food to prevent stomach upset.   Take any prescribed medications only as directed.  Home care instructions:  Consider using your inhaler every 4 hours if symptoms improving to help prevent breathing distress.  Follow any educational materials contained in this packet. Please continue drinking plenty of fluids. Use over-the-counter cold and flu medications as needed as directed on packaging for symptom relief. You may also use ibuprofen or tylenol as directed on packaging for pain or fever.   BE VERY CAREFUL not to take multiple medicines containing Tylenol (also called acetaminophen). Doing so can lead to an overdose which can damage your liver and cause liver failure and possibly death.   Follow-up instructions: Please follow-up with your primary care provider in the next 5 days for further evaluation of your symptoms if you are not feeling better.   Return instructions:  Please return to the Emergency Department if you experience worsening symptoms. Please return if you have a high fever greater than 101 degrees not controlled with over-the-counter medications, persistent vomiting and cannot keep down fluids, or worsening trouble breathing. Please return if you have any other emergent  concerns.  Additional Information:  Your vital signs today were: BP (!) 138/98 (BP Location: Left Arm)   Pulse (!) 104   Temp 98.8 F (37.1 C) (Oral)   Resp 18   Ht 5' (1.524 m)   Wt 66.2 kg   SpO2 95%   BMI 28.51 kg/m  If your blood pressure (BP) was elevated above 135/85 this visit, please have this repeated by your doctor within one month.

## 2022-10-03 NOTE — ED Triage Notes (Signed)
Pt states ha, body aches, cough, fever 100.1, and congestion since Tuesday  Motrin at 0600   H/o asthma , uses nebs and inhaler

## 2022-11-12 ENCOUNTER — Ambulatory Visit: Payer: Commercial Managed Care - HMO | Admitting: Allergy and Immunology

## 2022-11-14 ENCOUNTER — Other Ambulatory Visit (HOSPITAL_COMMUNITY): Payer: Self-pay

## 2022-11-14 ENCOUNTER — Telehealth: Payer: Self-pay

## 2022-11-14 NOTE — Telephone Encounter (Signed)
PA request received via CMM for Breztri Aerosphere 160-9-4.8MCG/ACT aerosol through [employer]  PA has been submitted and is awaiting determination  Key: [id number]

## 2022-11-14 NOTE — Telephone Encounter (Signed)
[employer] is currently showing that patient has other primary coverage. Letter being attached in patient documents.

## 2022-11-22 ENCOUNTER — Encounter: Payer: Self-pay | Admitting: Emergency Medicine

## 2022-11-22 ENCOUNTER — Telehealth: Payer: Self-pay | Admitting: Emergency Medicine

## 2022-11-22 ENCOUNTER — Ambulatory Visit
Admission: EM | Admit: 2022-11-22 | Discharge: 2022-11-22 | Disposition: A | Discharge status: Home / Self Care | Payer: Medicaid Other | Attending: Physician Assistant | Admitting: Physician Assistant

## 2022-11-22 DIAGNOSIS — J069 Acute upper respiratory infection, unspecified: Secondary | ICD-10-CM

## 2022-11-22 DIAGNOSIS — Z20822 Contact with and (suspected) exposure to covid-19: Secondary | ICD-10-CM

## 2022-11-22 NOTE — ED Provider Notes (Signed)
[name]    CSN: [id number] Arrival date & time: 11/22/22  1032      History   Chief Complaint Chief Complaint  Patient presents with   Cough    HPI [name] is a 57 y.o. female.   Pt reports cough began yesterday.  Pt has been exposed to covid.  Pt reports she needs a test.   The history is provided by the patient. No language interpreter was used.  Cough Cough characteristics:  Non-productive Sputum characteristics:  Nondescript Timing:  Constant Progression:  Worsening Chronicity:  New Relieved by:  Beta-agonist inhaler Worsened by:  Nothing   Past Medical History:  Diagnosis Date   Allergic reaction caused by a drug 01/25/2013   probably.    Allergy    Anemia    Arthritis    Asthma    GERD (gastroesophageal reflux disease)    Heart murmur    Hypertension    Positive TB test    Shingles    TOBACCO USE 03/22/2009   Qualifier: Diagnosis of  By: Regis Bill MD, [name]  Stopped Dec 12 2010      Patient Active Problem List   Diagnosis Date Noted   Right hip pain 08/13/2019   Greater trochanteric bursitis of both hips 07/01/2019   Headache 07/02/2013   Cough variant asthma 09/29/2009   Chest pain of uncertain etiology 97/35/3299   Essential hypertension 05/30/2009   GERD 05/12/2008   HOT FLASHES 05/12/2008   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   ALLERGIC RHINITIS 08/07/2007    Past Surgical History:  Procedure Laterality Date   ABDOMINAL HYSTERECTOMY     BREAST SURGERY     cyst from left breast   BUNIONECTOMY     both feet   CHOLECYSTECTOMY     CYSTECTOMY     left shoulder   KNEE SURGERY Left    PANENDOSCOPY     TONSILLECTOMY     TUBAL LIGATION     uterine ablastion     and polyps removed   Uterine polyps      OB History   No obstetric history on file.      Home Medications    Prior to Admission medications   Medication Sig Start Date End Date Taking? Authorizing Provider  albuterol (PROVENTIL) (2.5 MG/3ML) 0.083% nebulizer  solution Inhale 3 mLs (2.5 mg total) by nebulization every 6 (six) hours as needed for wheezing or shortness of breath. 06/01/21   [name], MD  albuterol (VENTOLIN HFA) 108 (90 Base) MCG/ACT inhaler Inhale 2 puffs into the lungs every 4 (four) hours as needed. 07/25/22   [name], MD  benzonatate (TESSALON) 100 MG capsule Take 1 capsule (100 mg total) by mouth every 8 (eight) hours. Patient not taking: Reported on 11/22/2022 10/03/22   Carlisle Cater, PA-C  BREZTRI AEROSPHERE 160-9-4.8 MCG/ACT AERO Inhale 2 puffs into the lungs in the morning and at bedtime. 09/17/22   Kozlow, Donnamarie Poag, MD  cetirizine (ZYRTEC) 10 MG tablet Take 1 tablet (10 mg total) by mouth daily as needed for allergies (Can take an extra dose during flare ups.). 06/04/22   Kozlow, Donnamarie Poag, MD  Cyanocobalamin (VITAMIN B-12 PO) Take by mouth daily.    [provider]  EPINEPHrine 0.3 mg/0.3 mL IJ SOAJ injection Inject 0.3 mg into the muscle as needed for anaphylaxis. 06/04/22   Kozlow, Donnamarie Poag, MD  esomeprazole (NEXIUM) 40 MG capsule Take 1 capsule (40 mg total) by mouth 2 (two)  times daily. Patient taking differently: Take 40 mg by mouth daily. 06/04/22   Kozlow, Donnamarie Poag, MD  famotidine (PEPCID) 40 MG tablet Take 1 tablet (40 mg total) by mouth daily. 06/04/22   Kozlow, Donnamarie Poag, MD  fluticasone (FLONASE) 50 MCG/ACT nasal spray USE 2 SPRAYS IN EACH NOSTRIL DAILY 04/13/21   [name], [name], MD  hydrochlorothiazide (HYDRODIURIL) 25 MG tablet TAKE 1 TABLET (25 MG TOTAL) BY MOUTH DAILY. 07/19/22   [name], [name], MD  predniSONE (DELTASONE) 20 MG tablet Take 2 tablets (40 mg total) by mouth daily. Patient not taking: Reported on 11/22/2022 10/03/22   Carlisle Cater, PA-C  triamcinolone cream (KENALOG) 0.1 % Apply 1 Application topically 2 (two) times daily. 05/01/22   Burchette, Alinda Sierras, MD  valsartan (DIOVAN) 320 MG tablet Take 0.5 tablets (160 mg total) by mouth daily. 02/07/22   [name], [name], MD  Zinc 25 MG TABS Take by mouth  daily.    [provider]    Family History Family History  Problem Relation Age of Onset   Hypertension Mother    Alzheimer's disease Mother    Hypertension Father    Prostate cancer Father    HIV Brother    Lupus Sister    Colon cancer Paternal Grandmother    Esophageal cancer Neg Hx    Liver cancer Neg Hx    Pancreatic cancer Neg Hx    Rectal cancer Neg Hx    Stomach cancer Neg Hx     Social History Social History   Tobacco Use   Smoking status: Former    Packs/day: 0.50    Years: 17.00    Total pack years: 8.50    Types: Cigarettes    Quit date: 12/12/2010    Years since quitting: 11.9   Smokeless tobacco: Never  Vaping Use   Vaping Use: Never used  Substance Use Topics   Alcohol use: No   Drug use: No     Allergies   Allegra [fexofenadine]; Cimetidine; Diovan hct [valsartan-hydrochlorothiazide]; Purified water [water, sterile]; Chocolate; Citric acid; Coly-mycin s; and Amlodipine   Review of Systems Review of Systems  Respiratory:  Positive for cough.   All other systems reviewed and are negative.    Physical Exam Triage Vital Signs ED Triage Vitals  Enc Vitals Group     BP 11/22/22 1058 (!) 146/91     Pulse Rate 11/22/22 1058 62     Resp 11/22/22 1058 14     Temp 11/22/22 1058 98.7 F (37.1 C)     Temp Source 11/22/22 1058 Oral     SpO2 11/22/22 1058 99 %     Weight 11/22/22 1059 138 lb (62.6 kg)     Height --      Head Circumference --      Peak Flow --      Pain Score 11/22/22 1059 0     Pain Loc --      Pain Edu? --      Excl. in [place]? --    No data found.  Updated Vital Signs BP (!) 146/91 (BP Location: Right Arm)   Pulse 62   Temp 98.7 F (37.1 C) (Oral)   Resp 14   Wt 62.6 kg   SpO2 99%   BMI 26.95 kg/m   Visual Acuity Right Eye Distance:   Left Eye Distance:   Bilateral Distance:    Right Eye Near:   Left Eye Near:    Bilateral Near:  Physical Exam Vitals and nursing note reviewed.  Constitutional:       Appearance: She is well-developed.  HENT:     Head: Normocephalic.  Cardiovascular:     Rate and Rhythm: Normal rate and regular rhythm.  Pulmonary:     Effort: Pulmonary effort is normal.  Abdominal:     General: There is no distension.  Musculoskeletal:        General: Normal range of motion.     Cervical back: Normal range of motion.  Neurological:     Mental Status: She is alert and oriented to person, place, and time.      UC Treatments / Results  Labs (all labs ordered are listed, but only abnormal results are displayed) Labs Reviewed  SARS CORONAVIRUS 2 (TAT 6-24 HRS)    EKG   Radiology No results found.  Procedures Procedures (including critical care time)  Medications Ordered in UC Medications - No data to display  Initial Impression / Assessment and Plan / UC Course  I have reviewed the triage vital signs and the nursing notes.  Pertinent labs & imaging results that were available during my care of the patient were reviewed by me and considered in my medical decision making (see chart for details).     Covid pending Final Clinical Impressions(s) / UC Diagnoses   Final diagnoses:  Close exposure to COVID-19 virus  Viral URI with cough   Discharge Instructions   None    ED Prescriptions   None    PDMP not reviewed this encounter. An After Visit Summary was printed and given to the patient.       Fransico Meadow, [place] 11/22/22 1137

## 2022-11-22 NOTE — ED Triage Notes (Signed)
Covid exposure at work  Pt has had COVID x 3 in past  Cough x 2 days  Pt would like a test to rule out  COVID

## 2022-11-22 NOTE — Telephone Encounter (Signed)
Pt had called back approx 30 min ago - return for COVID  PCR swab - provider updated - test reordered.

## 2022-11-22 NOTE — Telephone Encounter (Signed)
Sample was not labeled. Follow up call to [place] by this RN to ask her to come back in for another COVID PCR test or to go to another [place] location (Urgent Care) if they are closer to her home. RN apologized for the error. Call back # left - [phone]

## 2022-11-23 ENCOUNTER — Telehealth: Payer: Self-pay | Admitting: Emergency Medicine

## 2022-11-23 LAB — SARS CORONAVIRUS 2 (TAT 6-24 HRS): SARS Coronavirus 2: NEGATIVE

## 2022-11-23 NOTE — Telephone Encounter (Signed)
Call back to see how [name] was doing , patient had seen COVID results (negative). States she is feeling  better - no other questions at this time

## 2022-12-03 ENCOUNTER — Ambulatory Visit: Payer: Commercial Managed Care - HMO | Admitting: Allergy and Immunology

## 2023-01-21 ENCOUNTER — Ambulatory Visit: Payer: Commercial Managed Care - HMO | Admitting: Allergy and Immunology

## 2023-02-28 ENCOUNTER — Other Ambulatory Visit: Payer: Self-pay | Admitting: Internal Medicine

## 2023-03-03 ENCOUNTER — Other Ambulatory Visit: Payer: Self-pay | Admitting: *Deleted

## 2023-03-03 MED ORDER — FAMOTIDINE 40 MG PO TABS: 40.0000 mg | ORAL_TABLET | Freq: Every day | ORAL | 3 refills | Status: DC

## 2023-03-04 ENCOUNTER — Ambulatory Visit: Payer: Commercial Managed Care - HMO | Admitting: Allergy and Immunology

## 2023-03-10 ENCOUNTER — Telehealth: Payer: Self-pay

## 2023-03-10 NOTE — Telephone Encounter (Signed)
Pt called in stating she has no insurance left to samples here at the [facility] desk for pt to pick up.

## 2023-05-06 ENCOUNTER — Other Ambulatory Visit: Payer: Self-pay

## 2023-05-06 ENCOUNTER — Encounter: Payer: Self-pay | Admitting: Allergy and Immunology

## 2023-05-06 ENCOUNTER — Ambulatory Visit: Payer: BC Managed Care – PPO | Admitting: Allergy and Immunology

## 2023-05-06 VITALS — BP 120/90 | HR 64 | Temp 98.0°F | Resp 16 | Ht 60.24 in | Wt 147.0 lb

## 2023-05-06 DIAGNOSIS — J3089 Other allergic rhinitis: Secondary | ICD-10-CM

## 2023-05-06 DIAGNOSIS — J301 Allergic rhinitis due to pollen: Secondary | ICD-10-CM

## 2023-05-06 DIAGNOSIS — J383 Other diseases of vocal cords: Secondary | ICD-10-CM | POA: Diagnosis not present

## 2023-05-06 DIAGNOSIS — J4550 Severe persistent asthma, uncomplicated: Secondary | ICD-10-CM

## 2023-05-06 DIAGNOSIS — D7219 Other eosinophilia: Secondary | ICD-10-CM

## 2023-05-06 DIAGNOSIS — K219 Gastro-esophageal reflux disease without esophagitis: Secondary | ICD-10-CM | POA: Diagnosis not present

## 2023-05-06 DIAGNOSIS — T781XXD Other adverse food reactions, not elsewhere classified, subsequent encounter: Secondary | ICD-10-CM

## 2023-05-06 MED ORDER — CETIRIZINE HCL 10 MG PO TABS: 10.0000 mg | ORAL_TABLET | Freq: Every day | ORAL | 5 refills | Status: DC | PRN

## 2023-05-06 MED ORDER — ESOMEPRAZOLE MAGNESIUM 40 MG PO CPDR: 40.0000 mg | DELAYED_RELEASE_CAPSULE | Freq: Two times a day (BID) | ORAL | 1 refills | Status: DC

## 2023-05-06 MED ORDER — EPINEPHRINE 0.3 MG/0.3ML IJ SOAJ: 0.3000 mg | INTRAMUSCULAR | 1 refills | Status: DC | PRN

## 2023-05-06 MED ORDER — SPACER/AERO-HOLDING CHAMBERS DEVI: 1.0000 | 1 refills | Status: AC

## 2023-05-06 MED ORDER — NEBULIZER MASK ADULT MISC: 1.0000 | 1 refills | Status: AC

## 2023-05-06 MED ORDER — FAMOTIDINE 40 MG PO TABS: 40.0000 mg | ORAL_TABLET | Freq: Every evening | ORAL | 1 refills | Status: DC

## 2023-05-06 MED ORDER — ALBUTEROL SULFATE (2.5 MG/3ML) 0.083% IN NEBU: 2.5000 mg | INHALATION_SOLUTION | Freq: Four times a day (QID) | RESPIRATORY_TRACT | 12 refills | Status: DC | PRN

## 2023-05-06 MED ORDER — AIRSUPRA 90-80 MCG/ACT IN AERO: 2.0000 | INHALATION_SPRAY | RESPIRATORY_TRACT | 1 refills | Status: DC | PRN

## 2023-05-06 MED ORDER — FLUTICASONE PROPIONATE 50 MCG/ACT NA SUSP: 1.0000 | Freq: Two times a day (BID) | NASAL | 1 refills | Status: DC

## 2023-05-06 MED ORDER — BREZTRI AEROSPHERE 160-9-4.8 MCG/ACT IN AERO: 2.0000 | INHALATION_SPRAY | Freq: Two times a day (BID) | RESPIRATORY_TRACT | 1 refills | Status: DC

## 2023-05-06 NOTE — Progress Notes (Signed)
[place] - [place] - [place] - [place] - [place]   Follow-up Note  Referring Provider: [name], MD Primary Provider: [name], MD Date of Office Visit: 05/06/2023  Subjective:   [name] (DOB: 01-02-1966) is a 57 y.o. female who [facility] in re-evaluation of the following:  HPI: [name] returns to the clinic in evaluation of asthma, allergic rhinitis, LPR, vocal cord dysfunction, eosinophilia, food allergy.  I last saw her in this clinic 06 August 2022.  She has really done very well since her last visit with very little issues revolving around her respiratory tract without the need for systemic steroid or antibiotic for any type of airway issue and rare use of the short acting bronchodilator and no limitation on ability to exercise while she uses Breztri just 1 time per day and Flonase 1 time per day.  And her reflux is under pretty good control at this point in time while using Nexium twice a day and famotidine in the evening.  She drinks 1 coffee per day.  She can now eat nuts with no problem.  She still remains away from citrus.  Allergies as of 05/06/2023       Reactions   Allegra [fexofenadine] Hives   Cimetidine Hives   REACTION: unspecified   Diovan Hct [valsartan-hydrochlorothiazide] Swelling   Had facial swelling and throat itching after 2 doses of the generic had done well on the brand medicine for a while.  See ED visit February 2014   Purified Water [name], Sterile] Anaphylaxis   Chocolate Other (See Comments)   triggers Asthma   Citric Acid Other (See Comments)   REACTION: swelling, hives, itching   Coly-mycin S Other (See Comments)   REACTION: pt reports she passed out   Amlodipine Other (See Comments)   At 5 mg dose; swelling        Medication List    albuterol (2.5 MG/3ML) 0.083% nebulizer solution Commonly known as: PROVENTIL Inhale 3 mLs (2.5 mg total) by nebulization every 6  (six) hours as needed for wheezing or shortness of breath.   albuterol 108 (90 Base) MCG/ACT inhaler Commonly known as: VENTOLIN HFA Inhale 2 puffs into the lungs every 4 (four) hours as needed.   benzonatate 100 MG capsule Commonly known as: TESSALON Take 1 capsule (100 mg total) by mouth every 8 (eight) hours.   Breztri Aerosphere 160-9-4.8 MCG/ACT Aero Generic drug: Budeson-Glycopyrrol-Formoterol Inhale 2 puffs into the lungs in the morning and at bedtime.   cetirizine 10 MG tablet Commonly known as: ZYRTEC Take 1 tablet (10 mg total) by mouth daily as needed for allergies (Can take an extra dose during flare ups.).   EPINEPHrine 0.3 mg/0.3 mL Soaj injection Commonly known as: EPI-PEN Inject 0.3 mg into the muscle as needed for anaphylaxis.   esomeprazole 40 MG capsule Commonly known as: NEXIUM Take 1 capsule (40 mg total) by mouth 2 (two) times daily.   famotidine 40 MG tablet Commonly known as: PEPCID Take 1 tablet (40 mg total) by mouth daily.   fluticasone 50 MCG/ACT nasal spray Commonly known as: FLONASE USE 2 SPRAYS IN EACH NOSTRIL DAILY   hydrochlorothiazide 25 MG tablet Commonly known as: HYDRODIURIL TAKE 1 TABLET (25 MG TOTAL) BY MOUTH DAILY.   predniSONE 20 MG tablet Commonly known as: DELTASONE Take 2 tablets (40 mg total) by mouth daily.   triamcinolone cream 0.1 % Commonly known as: KENALOG Apply 1 Application topically  2 (two) times daily.   valsartan 320 MG tablet Commonly known as: DIOVAN TAKE 1/2 TABLETS (160 MG TOTAL) BY MOUTH DAILY.   VITAMIN B-12 PO Take by mouth daily.   Zinc 25 MG Tabs Take by mouth daily.    Past Medical History:  Diagnosis Date   Allergic reaction caused by a drug 01/25/2013   probably.    Allergy    Anemia    Arthritis    Asthma    GERD (gastroesophageal reflux disease)    Heart murmur    Hypertension    Positive TB test    Shingles    TOBACCO USE 03/22/2009   Qualifier: Diagnosis of  By: Fabian Sharp MD,  Neta Mends  Stopped Dec 12 2010      Past Surgical History:  Procedure Laterality Date   ABDOMINAL HYSTERECTOMY     BREAST SURGERY     cyst from left breast   BUNIONECTOMY     both feet   CHOLECYSTECTOMY     CYSTECTOMY     left shoulder   KNEE SURGERY Left    PANENDOSCOPY     TONSILLECTOMY     TUBAL LIGATION     uterine ablastion     and polyps removed   Uterine polyps      Review of systems negative except as noted in HPI / PMHx or noted below:  Review of Systems  Constitutional: Negative.   HENT: Negative.    Eyes: Negative.   Respiratory: Negative.    Cardiovascular: Negative.   Gastrointestinal: Negative.   Genitourinary: Negative.   Musculoskeletal: Negative.   Skin: Negative.   Neurological: Negative.   Endo/Heme/Allergies: Negative.   Psychiatric/Behavioral: Negative.       Objective:   Vitals:   05/06/23 1110  BP: (!) 124/90  Pulse: 64  Resp: 16  Temp: 98 F (36.7 C)  SpO2: 98%   Height: 5' 0.24" (153 cm)  Weight: 147 lb (66.7 kg)   Physical Exam Constitutional:      Appearance: She is not diaphoretic.  HENT:     Head: Normocephalic.     Right Ear: Tympanic membrane, ear canal and external ear normal.     Left Ear: Tympanic membrane, ear canal and external ear normal.     Nose: Nose normal. No mucosal edema or rhinorrhea.     Mouth/Throat:     Pharynx: Uvula midline. No oropharyngeal exudate.  Eyes:     Conjunctiva/sclera: Conjunctivae normal.  Neck:     Thyroid: No thyromegaly.     Trachea: Trachea normal. No tracheal tenderness or tracheal deviation.  Cardiovascular:     Rate and Rhythm: Normal rate and regular rhythm.     Heart sounds: Normal heart sounds, S1 normal and S2 normal. No murmur heard. Pulmonary:     Effort: No respiratory distress.     Breath sounds: Normal breath sounds. No stridor. No wheezing or rales.  Lymphadenopathy:     Head:     Right side of head: No tonsillar adenopathy.     Left side of head: No tonsillar  adenopathy.     Cervical: No cervical adenopathy.  Skin:    Findings: No erythema or rash.     Nails: There is no clubbing.  Neurological:     Mental Status: She is alert.     Diagnostics: Spirometry was performed and demonstrated an FEV1 of 1.48 at 76 % of predicted.  Assessment and Plan:   1. Asthma, severe persistent, well-controlled  2. Perennial allergic rhinitis   3. Seasonal allergic rhinitis due to pollen   4. LPRD (laryngopharyngeal reflux disease)   5. Vocal cord dysfunction   6. Other eosinophilia   7. Adverse food reaction, subsequent encounter   8. Not well controlled severe persistent asthma    1.  Allergen avoidance measures - dust mite, pollens, foods?   2.  Treat and prevent inflammation:  A. Breztri - 2 inhalations 1-2 times per day w/spacer (empty lungs) B. Flonase - 1 spray each nostril 1-2 times per day  3.  Treat and prevent LPR:  A. Minimize caffeine consumption B. Nexium 40 mg - 1 tablet 1-2 times per day C. Famotidine 40 mg - 1 tablet in evening D. Replace all throat clearing with drinking/swallowing maneuver  4.  If needed:  A. AIRSUPRA - 2 inhalations or nebulizer every 4-6 hours B. Cetirizine 10 mg - 1 tablet 1 time per day C. Epi-Pen, benadryl, MD/ER evaluation for allergic reaction  5.  Return to clinic in 6 months or earlier if problem  Airianna appears to be doing relatively well while using anti-inflammatory agents for both her upper and lower airway and aggressive therapy directed against LPR as noted above.  I have given her an anti-inflammatory rescue medicine to be used as needed.  I will see her back in this clinic in 6 months or earlier if there is a problem.  Laurette Schimke, MD Allergy / Immunology [facility]

## 2023-05-06 NOTE — Patient Instructions (Addendum)
  1.  Allergen avoidance measures - dust mite, pollens, foods?   2.  Treat and prevent inflammation:  A. Breztri - 2 inhalations 1-2 times per day w/spacer (empty lungs) B. Flonase - 1 spray each nostril 1-2 times per day  3.  Treat and prevent LPR:  A. Minimize caffeine consumption B. Nexium 40 mg - 1 tablet 1-2 times per day C. Famotidine 40 mg - 1 tablet in evening D. Replace all throat clearing with drinking/swallowing maneuver  4.  If needed:  A. AIRSUPRA - 2 inhalations or nebulizer every 4-6 hours B. Cetirizine 10 mg - 1 tablet 1 time per day C. Epi-Pen, benadryl, MD/ER evaluation for allergic reaction  5.  Return to clinic in 6 months or earlier if problem

## 2023-05-07 ENCOUNTER — Encounter: Payer: Self-pay | Admitting: Allergy and Immunology

## 2023-06-07 ENCOUNTER — Other Ambulatory Visit: Payer: Self-pay | Admitting: Internal Medicine

## 2023-06-15 ENCOUNTER — Other Ambulatory Visit: Payer: Self-pay

## 2023-06-15 ENCOUNTER — Emergency Department (HOSPITAL_BASED_OUTPATIENT_CLINIC_OR_DEPARTMENT_OTHER)
Admission: EM | Admit: 2023-06-15 | Discharge: 2023-06-15 | Disposition: A | Discharge status: Home / Self Care | Payer: BC Managed Care – PPO | Source: Home / Self Care | Attending: Emergency Medicine | Admitting: Emergency Medicine

## 2023-06-15 ENCOUNTER — Encounter (HOSPITAL_BASED_OUTPATIENT_CLINIC_OR_DEPARTMENT_OTHER): Payer: Self-pay

## 2023-06-15 DIAGNOSIS — R000 Tachycardia, unspecified: Secondary | ICD-10-CM | POA: Diagnosis not present

## 2023-06-15 DIAGNOSIS — Z7951 Long term (current) use of inhaled steroids: Secondary | ICD-10-CM | POA: Diagnosis not present

## 2023-06-15 DIAGNOSIS — U071 COVID-19: Secondary | ICD-10-CM | POA: Insufficient documentation

## 2023-06-15 DIAGNOSIS — J029 Acute pharyngitis, unspecified: Secondary | ICD-10-CM | POA: Diagnosis not present

## 2023-06-15 DIAGNOSIS — J45909 Unspecified asthma, uncomplicated: Secondary | ICD-10-CM | POA: Diagnosis not present

## 2023-06-15 LAB — SARS CORONAVIRUS 2 BY RT PCR: SARS Coronavirus 2 by RT PCR: POSITIVE — AB

## 2023-06-15 MED ORDER — ONDANSETRON 4 MG PO TBDP: 4.0000 mg | ORAL_TABLET | Freq: Three times a day (TID) | ORAL | 0 refills | Status: DC | PRN

## 2023-06-15 MED ORDER — ONDANSETRON 4 MG PO TBDP
4.0000 mg | ORAL_TABLET | Freq: Once | ORAL | Status: AC
  Administered 2023-06-15: 4 mg via ORAL
  Filled 2023-06-15: qty 1

## 2023-06-15 NOTE — Discharge Instructions (Signed)
Please read and follow all provided instructions.  Your diagnoses today include:  1. COVID-19     Tests performed today include: Vital signs. See below for your results today.  COVID test -was positive  Medications prescribed:  Zofran (ondansetron) - for nausea and vomiting  Take any prescribed medications only as directed. Treatment for your infection is aimed at treating the symptoms. There are no medications, such as antibiotics, that will cure your infection.   Home care instructions:  Follow any educational materials contained in this packet.   Your illness is contagious and can be spread to others, especially during the first 3 or 4 days. It cannot be cured by antibiotics or other medicines. Take basic precautions such as washing your hands often, covering your mouth when you cough or sneeze, and avoiding public places where you could spread your illness to others.   Please continue drinking plenty of fluids.  Use over-the-counter medicines as needed as directed on packaging for symptom relief.  You may also use ibuprofen or tylenol as directed on packaging for pain or fever.  Do not take multiple medicines containing Tylenol or acetaminophen to avoid taking too much of this medication.  If you are positive for Covid-19, you should isolate yourself and not be exposed to other people for 5 days after your symptoms began. If you are not feeling better at day 5, you need to isolate yourself for a total of 10 days. If you are feeling better by day 5, you should wear a mask properly, over your nose and mouth, at all times while around other people until 10 days after your symptoms started.   Follow-up instructions: Please follow-up with your primary care provider as needed for further evaluation of your symptoms if you are not feeling better.   Return instructions:  Please return to the Emergency Department if you experience worsening symptoms.  Return to the emergency department if you  have worsening shortness of breath breathing or increased work of breathing, persistent vomiting RETURN IMMEDIATELY IF you develop shortness of breath, confusion or altered mental status, a new rash, become dizzy, faint, or poorly responsive, or are unable to be cared for at home. Please return if you have persistent vomiting and cannot keep down fluids or develop a fever that is not controlled by tylenol or motrin.   Please return if you have any other emergent concerns.  Additional Information:  Your vital signs today were: BP 132/85 (BP Location: Right Arm)   Pulse (!) 108   Temp 99.2 F (37.3 C) (Temporal)   Resp 16   Ht 5' (1.524 m)   Wt 64 kg   SpO2 95%   BMI 27.54 kg/m  If your blood pressure (BP) was elevated above 135/85 this visit, please have this repeated by your doctor within one month. --------------

## 2023-06-15 NOTE — ED Triage Notes (Signed)
Pt arrives to ED with c/o body aches, chills, scratchy throat, and nausea that started yesterday.

## 2023-06-15 NOTE — ED Provider Notes (Signed)
[place] EMERGENCY DEPARTMENT AT [facility] Provider Note   CSN: [id number] Arrival date & time: 06/15/23  1322     History  Chief Complaint  Patient presents with   Generalized Body Aches    [name] is a 57 y.o. female.  Patient presents to the emergency department today for evaluation of generalized bodyaches, sore throat, headache, fever starting late yesterday afternoon.  She has had nausea but no vomiting.  No urinary symptoms or skin rash.  No known sick contacts.  She has a history of asthma but feels that her breathing is currently well-controlled.  No wheezing.  She has not tried to take any medicines at home due to nausea.       Home Medications Prior to Admission medications   Medication Sig Start Date End Date Taking? Authorizing Provider  ondansetron (ZOFRAN-ODT) 4 MG disintegrating tablet Take 1 tablet (4 mg total) by mouth every 8 (eight) hours as needed for nausea or vomiting. 06/15/23  Yes Renne Crigler, PA-C  albuterol (PROVENTIL) (2.5 MG/3ML) 0.083% nebulizer solution Inhale 3 mLs (2.5 mg total) by nebulization every 6 (six) hours as needed for wheezing or shortness of breath. 05/06/23   Kozlow, Alvira Philips, MD  Albuterol-Budesonide (AIRSUPRA) 90-80 MCG/ACT AERO Inhale 2 Inhalations into the lungs every 4 (four) hours as needed. 05/06/23   Kozlow, Alvira Philips, MD  benzonatate (TESSALON) 100 MG capsule Take 1 capsule (100 mg total) by mouth every 8 (eight) hours. Patient not taking: Reported on 11/22/2022 10/03/22   Renne Crigler, PA-C  BREZTRI AEROSPHERE 160-9-4.8 MCG/ACT AERO Inhale 2 puffs into the lungs in the morning and at bedtime. 05/06/23   Kozlow, Alvira Philips, MD  cetirizine (ZYRTEC) 10 MG tablet Take 1 tablet (10 mg total) by mouth daily as needed for allergies (Can take an extra dose during flare ups.). 05/06/23   Kozlow, Alvira Philips, MD  Cyanocobalamin (VITAMIN B-12 PO) Take by mouth daily.    [provider]  EPINEPHrine 0.3 mg/0.3 mL IJ SOAJ  injection Inject 0.3 mg into the muscle as needed for anaphylaxis. 05/06/23   Kozlow, Alvira Philips, MD  esomeprazole (NEXIUM) 40 MG capsule Take 1 capsule (40 mg total) by mouth in the morning and at bedtime. 05/06/23   Kozlow, Alvira Philips, MD  famotidine (PEPCID) 40 MG tablet Take 1 tablet (40 mg total) by mouth at bedtime. 05/06/23   Kozlow, Alvira Philips, MD  fluticasone (FLONASE) 50 MCG/ACT nasal spray Place 1 spray into both nostrils in the morning and at bedtime. 05/06/23   Kozlow, Alvira Philips, MD  hydrochlorothiazide (HYDRODIURIL) 25 MG tablet TAKE 1 TABLET (25 MG TOTAL) BY MOUTH DAILY. 06/09/23   [name], Neta Mends, MD  predniSONE (DELTASONE) 20 MG tablet Take 2 tablets (40 mg total) by mouth daily. Patient not taking: Reported on 11/22/2022 10/03/22   Renne Crigler, PA-C  Respiratory Therapy Supplies (NEBULIZER MASK ADULT) MISC 1 kit by Does not apply route as directed. 05/06/23   Kozlow, Alvira Philips, MD  Spacer/Aero-Holding Deretha Emory DEVI 1 Device by Does not apply route as directed. 05/06/23   Kozlow, Alvira Philips, MD  triamcinolone cream (KENALOG) 0.1 % Apply 1 Application topically 2 (two) times daily. Patient not taking: Reported on 05/06/2023 05/01/22   Kristian Covey, MD  valsartan (DIOVAN) 320 MG tablet TAKE 1/2 TABLETS (160 MG TOTAL) BY MOUTH DAILY. 02/28/23   Worthy Rancher B, FNP  Zinc 25 MG TABS Take by mouth daily. Patient not taking: Reported on 05/06/2023  [provider]      Allergies    Allegra [fexofenadine]; Cimetidine; Diovan hct [valsartan-hydrochlorothiazide]; Purified water [water, sterile]; Chocolate; Citric acid; Coly-mycin s; and Amlodipine    Review of Systems   Review of Systems  Physical Exam Updated Vital Signs BP 132/85 (BP Location: Right Arm)   Pulse (!) 108   Temp 99.2 F (37.3 C) (Temporal)   Resp 16   Ht 5' (1.524 m)   Wt 64 kg   SpO2 95%   BMI 27.54 kg/m  Physical Exam Vitals and nursing note reviewed.  Constitutional:      Appearance: She is well-developed.  HENT:     Head:  Normocephalic and atraumatic.     Jaw: No trismus.     Right Ear: Tympanic membrane, ear canal and external ear normal.     Left Ear: Tympanic membrane, ear canal and external ear normal.     Nose: Congestion present. No mucosal edema or rhinorrhea.     Mouth/Throat:     Mouth: Mucous membranes are moist. Mucous membranes are not dry. No oral lesions.     Pharynx: Uvula midline. Posterior oropharyngeal erythema present. No oropharyngeal exudate or uvula swelling.     Tonsils: No tonsillar abscesses.  Eyes:     General:        Right eye: No discharge.        Left eye: No discharge.     Conjunctiva/sclera: Conjunctivae normal.  Cardiovascular:     Rate and Rhythm: Regular rhythm. Tachycardia present.     Heart sounds: Normal heart sounds.     Comments: Mild tachycardia Pulmonary:     Effort: Pulmonary effort is normal. No respiratory distress.     Breath sounds: Normal breath sounds. No wheezing or rales.  Abdominal:     Palpations: Abdomen is soft.     Tenderness: There is no abdominal tenderness.  Musculoskeletal:     Cervical back: Normal range of motion and neck supple.  Lymphadenopathy:     Cervical: No cervical adenopathy.  Skin:    General: Skin is warm and dry.  Neurological:     Mental Status: She is alert.  Psychiatric:        Mood and Affect: Mood normal.     ED Results / Procedures / Treatments   Labs (all labs ordered are listed, but only abnormal results are displayed) Labs Reviewed  SARS CORONAVIRUS 2 BY RT PCR - Abnormal; Notable for the following components:      Result Value   SARS Coronavirus 2 by RT PCR POSITIVE (*)    All other components within normal limits    EKG None  Radiology No results found.  Procedures Procedures    Medications Ordered in ED Medications  ondansetron (ZOFRAN-ODT) disintegrating tablet 4 mg (4 mg Oral Given 06/15/23 1508)    ED Course/ Medical Decision Making/ A&P    Patient seen and examined. History obtained  directly from patient. Work-up including labs, imaging, EKG ordered in triage, if performed, were reviewed.    Labs/EKG: Independently reviewed and interpreted.  This included: COVID test was positive  Imaging: None  Medications/Fluids: Zofran  Most recent vital signs reviewed and are as follows: BP 132/85 (BP Location: Right Arm)   Pulse (!) 108   Temp 99.2 F (37.3 C) (Temporal)   Resp 16   Ht 5' (1.524 m)   Wt 64 kg   SpO2 95%   BMI 27.54 kg/m   Initial impression: COVID-19 infection without  immediate complications.  She appears to be stable from a asthma standpoint.  Home treatment plan: We discussed risks and benefits of Paxlovid and she declines.  We will give a prescription for Zofran.  Encouraged hydration and symptom control, isolation.  Detailed discussion had with with patient regarding COVID-19 precautions and written instructions given as well.  We discussed need to isolate themselves for 5 days from onset of symptoms and have 24 hours of improvement prior to breaking isolation.  We discussed that when breaking isolation, mask wearing for 5 additional days is required.  We discussed signs symptoms to return which include worsening shortness of breath, trouble breathing, or increased work of breathing.  Also return with persistent vomiting, confusion, passing out, or if they have any other concerns. Counseled on the need for rest and good hydration. Discussed that high-risk contacts should be aware of positive result and they need to quarantine and be tested if they develop any symptoms. Patient verbalizes understanding.                                 Medical Decision Making Risk Prescription drug management.   Overall well-appearing patient with reassuring vitals and history and symptoms consistent with COVID-19 infection.  She has nausea but is not vomiting.  Do not suspect significant dehydration.  She does have a history of asthma but is not currently wheezing and  her breathing is currently normal.  Low concern for pneumonia, PE.  She will continue symptomatic care at home as above.  The patient's vital signs, pertinent lab work and imaging were reviewed and interpreted as discussed in the ED course. Hospitalization was considered for further testing, treatments, or serial exams/observation. However as patient is well-appearing, has a stable exam, and reassuring studies today, I do not feel that they warrant admission at this time. This plan was discussed with the patient who verbalizes agreement and comfort with this plan and seems reliable and able to return to the Emergency Department with worsening or changing symptoms.          Final Clinical Impression(s) / ED Diagnoses Final diagnoses:  COVID-19    Rx / DC Orders ED Discharge Orders          Ordered    ondansetron (ZOFRAN-ODT) 4 MG disintegrating tablet  Every 8 hours PRN        06/15/23 1503              Renne Crigler, PA-C 06/15/23 1511    Derwood Kaplan, MD 06/16/23 0720

## 2023-06-15 NOTE — ED Notes (Signed)
Patient verbalizes understanding of discharge instructions. Opportunity for questioning and answers were provided. Patient discharged from ED.  °

## 2023-06-17 ENCOUNTER — Other Ambulatory Visit: Payer: Self-pay

## 2023-06-17 ENCOUNTER — Encounter (HOSPITAL_BASED_OUTPATIENT_CLINIC_OR_DEPARTMENT_OTHER): Payer: Self-pay | Admitting: Emergency Medicine

## 2023-06-17 ENCOUNTER — Emergency Department (HOSPITAL_BASED_OUTPATIENT_CLINIC_OR_DEPARTMENT_OTHER): Payer: BC Managed Care – PPO | Admitting: Radiology

## 2023-06-17 ENCOUNTER — Emergency Department (HOSPITAL_BASED_OUTPATIENT_CLINIC_OR_DEPARTMENT_OTHER)
Admission: EM | Admit: 2023-06-17 | Discharge: 2023-06-17 | Disposition: A | Discharge status: Home / Self Care | Payer: BC Managed Care – PPO

## 2023-06-17 DIAGNOSIS — U071 COVID-19: Secondary | ICD-10-CM | POA: Diagnosis not present

## 2023-06-17 DIAGNOSIS — R079 Chest pain, unspecified: Secondary | ICD-10-CM

## 2023-06-17 DIAGNOSIS — R0789 Other chest pain: Secondary | ICD-10-CM | POA: Diagnosis not present

## 2023-06-17 DIAGNOSIS — Z8616 Personal history of COVID-19: Secondary | ICD-10-CM | POA: Diagnosis not present

## 2023-06-17 LAB — CBC
HCT: 40.5 % (ref 36.0–46.0)
Hemoglobin: 13.3 g/dL (ref 12.0–15.0)
MCH: 27.3 pg (ref 26.0–34.0)
MCHC: 32.8 g/dL (ref 30.0–36.0)
MCV: 83 fL (ref 80.0–100.0)
Platelets: 283 10*3/uL (ref 150–400)
RBC: 4.88 MIL/uL (ref 3.87–5.11)
RDW: 15.5 % (ref 11.5–15.5)
WBC: 3.8 10*3/uL — ABNORMAL LOW (ref 4.0–10.5)
nRBC: 0 % (ref 0.0–0.2)

## 2023-06-17 LAB — COMPREHENSIVE METABOLIC PANEL
ALT: 11 U/L (ref 0–44)
AST: 17 U/L (ref 15–41)
Albumin: 4.4 g/dL (ref 3.5–5.0)
Alkaline Phosphatase: 48 U/L (ref 38–126)
Anion gap: 10 (ref 5–15)
BUN: 12 mg/dL (ref 6–20)
CO2: 26 mmol/L (ref 22–32)
Calcium: 9.2 mg/dL (ref 8.9–10.3)
Chloride: 102 mmol/L (ref 98–111)
Creatinine, Ser: 0.95 mg/dL (ref 0.44–1.00)
GFR, Estimated: >60 mL/min (ref >60)
Glucose, Bld: 85 mg/dL (ref 70–99)
Potassium: 3.4 mmol/L — ABNORMAL LOW (ref 3.5–5.1)
Sodium: 138 mmol/L (ref 135–145)
Total Bilirubin: 0.4 mg/dL (ref 0.3–1.2)
Total Protein: 7.8 g/dL (ref 6.5–8.1)

## 2023-06-17 LAB — D-DIMER, QUANTITATIVE: D-Dimer, Quant: <0.27 ug/mL-FEU (ref 0.00–0.50)

## 2023-06-17 LAB — TROPONIN I (HIGH SENSITIVITY)
Troponin I (High Sensitivity): 2 ng/L (ref <18)
Troponin I (High Sensitivity): 2 ng/L (ref <18)

## 2023-06-17 MED ORDER — ASPIRIN 325 MG PO TABS
325.0000 mg | ORAL_TABLET | Freq: Every day | ORAL | Status: DC
  Administered 2023-06-17: 325 mg via ORAL
  Filled 2023-06-17: qty 1

## 2023-06-17 NOTE — ED Provider Notes (Signed)
[place] EMERGENCY DEPARTMENT AT [facility] Provider Note   CSN: [id number] Arrival date & time: 06/17/23  1013     History  Chief Complaint  Patient presents with   Chest Pain    [name] is a 57 y.o. female.  56 year old resenting emergency department chief complaint of right-sided chest pain.  Performed symptoms started last night and persisted into this morning.  Described as pressure, with some radiation to her right arm.  Mild shortness of breath.  Reportedly diagnosed with COVID several days ago.  No nausea no vomiting or diarrhea.   Chest Pain      Home Medications Prior to Admission medications   Medication Sig Start Date End Date Taking? Authorizing Provider  albuterol (PROVENTIL) (2.5 MG/3ML) 0.083% nebulizer solution Inhale 3 mLs (2.5 mg total) by nebulization every 6 (six) hours as needed for wheezing or shortness of breath. 05/06/23   Kozlow, Alvira Philips, MD  Albuterol-Budesonide (AIRSUPRA) 90-80 MCG/ACT AERO Inhale 2 Inhalations into the lungs every 4 (four) hours as needed. 05/06/23   Kozlow, Alvira Philips, MD  benzonatate (TESSALON) 100 MG capsule Take 1 capsule (100 mg total) by mouth every 8 (eight) hours. Patient not taking: Reported on 11/22/2022 10/03/22   Renne Crigler, PA-C  BREZTRI AEROSPHERE 160-9-4.8 MCG/ACT AERO Inhale 2 puffs into the lungs in the morning and at bedtime. 05/06/23   Kozlow, Alvira Philips, MD  cetirizine (ZYRTEC) 10 MG tablet Take 1 tablet (10 mg total) by mouth daily as needed for allergies (Can take an extra dose during flare ups.). 05/06/23   Kozlow, Alvira Philips, MD  Cyanocobalamin (VITAMIN B-12 PO) Take by mouth daily.    [provider]  EPINEPHrine 0.3 mg/0.3 mL IJ SOAJ injection Inject 0.3 mg into the muscle as needed for anaphylaxis. 05/06/23   Kozlow, Alvira Philips, MD  esomeprazole (NEXIUM) 40 MG capsule Take 1 capsule (40 mg total) by mouth in the morning and at bedtime. 05/06/23   Kozlow, Alvira Philips, MD  famotidine (PEPCID) 40 MG tablet Take 1  tablet (40 mg total) by mouth at bedtime. 05/06/23   Kozlow, Alvira Philips, MD  fluticasone (FLONASE) 50 MCG/ACT nasal spray Place 1 spray into both nostrils in the morning and at bedtime. 05/06/23   Kozlow, Alvira Philips, MD  hydrochlorothiazide (HYDRODIURIL) 25 MG tablet TAKE 1 TABLET (25 MG TOTAL) BY MOUTH DAILY. 06/09/23   [name], Neta Mends, MD  ondansetron (ZOFRAN-ODT) 4 MG disintegrating tablet Take 1 tablet (4 mg total) by mouth every 8 (eight) hours as needed for nausea or vomiting. 06/15/23   Renne Crigler, PA-C  predniSONE (DELTASONE) 20 MG tablet Take 2 tablets (40 mg total) by mouth daily. Patient not taking: Reported on 11/22/2022 10/03/22   Renne Crigler, PA-C  Respiratory Therapy Supplies (NEBULIZER MASK ADULT) MISC 1 kit by Does not apply route as directed. 05/06/23   Kozlow, Alvira Philips, MD  Spacer/Aero-Holding Deretha Emory DEVI 1 Device by Does not apply route as directed. 05/06/23   Kozlow, Alvira Philips, MD  triamcinolone cream (KENALOG) 0.1 % Apply 1 Application topically 2 (two) times daily. Patient not taking: Reported on 05/06/2023 05/01/22   Kristian Covey, MD  valsartan (DIOVAN) 320 MG tablet TAKE 1/2 TABLETS (160 MG TOTAL) BY MOUTH DAILY. 02/28/23   Worthy Rancher B, FNP  Zinc 25 MG TABS Take by mouth daily. Patient not taking: Reported on 05/06/2023    [provider]      Allergies    Allegra [fexofenadine]; Cimetidine; Diovan hct [  valsartan-hydrochlorothiazide]; Purified water [water, sterile]; Chocolate; Citric acid; Coly-mycin s; and Amlodipine    Review of Systems   Review of Systems  Cardiovascular:  Positive for chest pain.    Physical Exam Updated Vital Signs BP (!) 141/93   Pulse (!) 57   Temp 99.3 F (37.4 C) (Oral)   Resp 15   SpO2 97%  Physical Exam Vitals and nursing note reviewed.  Constitutional:      General: She is not in acute distress.    Appearance: She is not toxic-appearing.  Cardiovascular:     Rate and Rhythm: Normal rate and regular rhythm.     Pulses:           Radial pulses are 2+ on the right side and 2+ on the left side.     Heart sounds: Normal heart sounds.  Pulmonary:     Effort: Pulmonary effort is normal.     Breath sounds: Normal breath sounds. No decreased breath sounds, wheezing or rhonchi.  Abdominal:     Palpations: Abdomen is soft.  Musculoskeletal:     Right lower leg: No edema.     Left lower leg: No edema.  Neurological:     Mental Status: She is alert.     ED Results / Procedures / Treatments   Labs (all labs ordered are listed, but only abnormal results are displayed) Labs Reviewed  CBC - Abnormal; Notable for the following components:      Result Value   WBC 3.8 (*)    All other components within normal limits  COMPREHENSIVE METABOLIC PANEL - Abnormal; Notable for the following components:   Potassium 3.4 (*)    All other components within normal limits  D-DIMER, QUANTITATIVE  TROPONIN I (HIGH SENSITIVITY)  TROPONIN I (HIGH SENSITIVITY)    EKG None  Radiology DG Chest 2 View  Result Date: 06/17/2023 CLINICAL DATA:  57 year old female with chest pain EXAM: CHEST - 2 VIEW COMPARISON:  10/03/2022 FINDINGS: Cardiomediastinal silhouette unchanged in size and contour. No evidence of central vascular congestion. No interlobular septal thickening. Similar linear atelectasis/scarring in the left lung. No pneumothorax or pleural effusion. Coarsened interstitial markings, with no confluent airspace disease. No acute displaced fracture. Degenerative changes of the spine. IMPRESSION: Negative for acute cardiopulmonary disease Electronically Signed   By: Gilmer Mor D.O.   On: 06/17/2023 12:04    Procedures Procedures    Medications Ordered in ED Medications  aspirin tablet 325 mg (325 mg Oral Given 06/17/23 1104)    ED Course/ Medical Decision Making/ A&P Clinical Course as of 06/17/23 1446  Tue Jun 17, 2023  1123 D-Dimer, Quant: <0.27 PE less likely [TY]  1123 CBC(!) No anemia.  No leukocytosis. [TY]  1156  Troponin I (High Sensitivity): 2 ACS less likely [TY]  1239 DG Chest 2 View IMPRESSION: Negative for acute cardiopulmonary disease   [TY]    Clinical Course User Index [TY] Coral Spikes, DO                                 Medical Decision Making Well-appearing 57 year old female presenting emergency department for right-sided chest pain in the setting of recent COVID.  She is afebrile vital signs reassuring.  Maintaining oxygen saturation on room air.  Clear lungs.  Troponin negative x 2.  Low/intermediate heart score.  EKG without ST segment changes for ischemia.  ACS is unlikely.  Chest x-ray without pneumonia.  Labs also reassuring.  No leukocytosis.  No anemia.  She has no significant metabolic derangements.  That would explain her symptoms.  Her creatinine is normal.  No transaminitis to suggest hepatobiliary disease.  Given her COVID status.  Concern for possible PE.  However, she is otherwise low risk.  D-dimer used to stratify patient.  It was not elevated and therefore PE less likely.  Suspect pleuritis.  Given patient's stable vitals, negative workup x 2.  Feel that she is safe for discharge at this time.  Follow-up PCP.  Amount and/or Complexity of Data Reviewed External Data Reviewed:     Details: CT coronary 3 years ago per chart review: "IMPRESSION: 1. Coronary artery calcium score 0 Agatston units, suggesting low risk for future cardiac events.   2.  No significant coronary disease.  High anterior take-off of RCA. " Labs: ordered. Decision-making details documented in ED Course. Radiology: ordered. Decision-making details documented in ED Course.  Risk OTC drugs. Decision regarding hospitalization.           Final Clinical Impression(s) / ED Diagnoses Final diagnoses:  None    Rx / DC Orders ED Discharge Orders     None         Coral Spikes, DO 06/17/23 1446

## 2023-06-17 NOTE — Discharge Instructions (Signed)
Please follow-up with your primary doctor soon as possible.  We are also giving the number to a cardiologist to follow-up with for further evaluation of your chest pain as you do have some risk.  Please return to emergency department medially if develop fevers, chills, new or worsening chest pain, shortness of breath, lightheadedness, passout or you develop any new or worsening symptoms that are concerning to you.

## 2023-06-17 NOTE — ED Triage Notes (Signed)
Pt returns ,she was seen here 2 days ago and dx COVID. Pt states her right chest and right arm are hurting. She states it has not aggravated her asthma and she is not short of breath. She states it hurts worse when she takes a deep breath

## 2023-06-19 ENCOUNTER — Telehealth: Payer: Self-pay | Admitting: Internal Medicine

## 2023-06-19 NOTE — Telephone Encounter (Signed)
Spoke to pt. Pt states she still has chills, headache, cough (chunk of green phlegm), body ache(better), leg weakness. Told pt the message would forwarded to provider. And scheduled a follow up appt for tomorrow.   Follow up with Dr. Fabian Sharp regarding to pt concerns. She recommended to not test again unless it is required by employer . Can be seen tomorrow if develop new sx or would like a follow up. would monitor sx over the weekend. Advise to rest and should not be around others. If still not better by Monday, can schedule a follow up appt.    Attempted to reach pt twice. Pt answered twice but was not able to hear then she hung up.

## 2023-06-19 NOTE — Telephone Encounter (Signed)
Tested positive for covid Sunday, still feels horrible and still testing positive. Asking should she still quarantine. Has a work excuse to send her back to work Monday.

## 2023-06-20 ENCOUNTER — Ambulatory Visit: Payer: BC Managed Care – PPO | Admitting: Family Medicine

## 2023-06-20 NOTE — Telephone Encounter (Signed)
Spoke to pt today and inform her of provider's advise.   Pt would like to be seen on Monday. Appt is made with Dr. Salomon Fick.

## 2023-06-23 ENCOUNTER — Ambulatory Visit (INDEPENDENT_AMBULATORY_CARE_PROVIDER_SITE_OTHER): Payer: BC Managed Care – PPO | Admitting: Family Medicine

## 2023-06-23 ENCOUNTER — Encounter: Payer: Self-pay | Admitting: Family Medicine

## 2023-06-23 VITALS — BP 152/100 | HR 55 | Temp 98.6°F | Wt 141.2 lb

## 2023-06-23 DIAGNOSIS — R6883 Chills (without fever): Secondary | ICD-10-CM | POA: Diagnosis not present

## 2023-06-23 DIAGNOSIS — I10 Essential (primary) hypertension: Secondary | ICD-10-CM

## 2023-06-23 DIAGNOSIS — U071 COVID-19: Secondary | ICD-10-CM | POA: Diagnosis not present

## 2023-06-23 DIAGNOSIS — M545 Low back pain, unspecified: Secondary | ICD-10-CM

## 2023-06-23 DIAGNOSIS — J029 Acute pharyngitis, unspecified: Secondary | ICD-10-CM | POA: Diagnosis not present

## 2023-06-23 DIAGNOSIS — R52 Pain, unspecified: Secondary | ICD-10-CM

## 2023-06-23 LAB — POC URINALSYSI DIPSTICK (AUTOMATED)
Bilirubin, UA: NEGATIVE
Blood, UA: NEGATIVE
Glucose, UA: NEGATIVE
Ketones, UA: NEGATIVE
Leukocytes, UA: NEGATIVE
Nitrite, UA: NEGATIVE
Protein, UA: NEGATIVE
Spec Grav, UA: 1.015 (ref 1.010–1.025)
Urobilinogen, UA: NEGATIVE E.U./dL — AB
pH, UA: 7 (ref 5.0–8.0)

## 2023-06-23 NOTE — Patient Instructions (Addendum)
Urine testing was negative for any signs of infection.  Take your blood pressure medication this morning.  Continue supportive care with over-the-counter cough and cold medications.  Would advise you to take cough and cold medications for people with high blood pressure such as Coricidin HBP or Mucinex HBP.  Avoid decongestants.

## 2023-06-23 NOTE — Progress Notes (Signed)
Established Patient Office Visit   Subjective  Patient ID: [name], female    DOB: [birth year]  Age: 57 y.o. MRN: [id number]  Chief Complaint  Patient presents with   Cough    Cough is terrible, throat is sore, pains in lower left back. COVID dx 8/11. Takes ibuprofen and tylenol for ack pain but after a few hours it comes back. Did not receive anything from ED for COVID, has taken OTC dayquil type of medication. Also feels weak, after doing anything she just feels weak.     Patient is a 57 year old female followed by Dr. Fabian Sharp and seen for acute concern.  Patient diagnosed with COVID on 06/15/2023 in the ED.  Patient returned to ED on 8/13 for mild SOB.  D-dimer and CXR negative.  Patient presents today noting mild improvement in symptoms.  Patient still with very bad sore throat, body aches, left-sided low back pain that is not constant.  Patient also notes a soreness/cramping in left shin with laying down which started last week.  Patient denies dysuria, fever, patient was able to start eating some on Saturday.  Before this she was only able to take small sips of ginger tea, water, and Gatorade.  Pt did not take bp meds this am.      ROS Negative unless stated above    Objective:     BP (!) 152/100 (BP Location: Right Arm, Patient Position: Sitting, Cuff Size: Normal)   Pulse (!) 55   Temp 98.6 F (37 C) (Oral)   Wt 141 lb 3.2 oz (64 kg)   SpO2 96%   BMI 27.58 kg/m    Physical Exam Constitutional:      General: She is not in acute distress.    Appearance: Normal appearance. She is ill-appearing.  HENT:     Head: Normocephalic and atraumatic.     Nose: Nose normal.     Mouth/Throat:     Mouth: Mucous membranes are moist.  Eyes:     Extraocular Movements: Extraocular movements intact.     Conjunctiva/sclera: Conjunctivae normal.  Cardiovascular:     Rate and Rhythm: Normal rate and regular rhythm.     Heart sounds: Normal heart sounds. No murmur heard.    No  gallop.  Pulmonary:     Effort: Pulmonary effort is normal. No respiratory distress.     Breath sounds: Normal breath sounds. No wheezing, rhonchi or rales.  Musculoskeletal:     Comments:  No TTP of cervical, thoracic spine or paraspinal muscles.  TTP of midline lumbar spine and left paraspinal muscles.  Lymphadenopathy:     Cervical: No cervical adenopathy.  Skin:    General: Skin is warm and dry.  Neurological:     Mental Status: She is alert and oriented to person, place, and time.    Results for orders placed or performed in visit on 06/23/23  POCT Urinalysis Dipstick (Automated)  Result Value Ref Range   Color, UA yellow    Clarity, UA clear    Glucose, UA Negative Negative   Bilirubin, UA neg    Ketones, UA neg    Spec Grav, UA 1.015 1.010 - 1.025   Blood, UA neg    pH, UA 7.0 5.0 - 8.0   Protein, UA Negative Negative   Urobilinogen, UA negative (A) 0.2 or 1.0 E.U./dL   Nitrite, UA neg    Leukocytes, UA Negative Negative      Assessment & Plan:  COVID-19 virus infection  Sore throat  Body aches  Chills  Acute left-sided low back pain without sciatica -     POCT Urinalysis Dipstick (Automated)  Essential hypertension -Uncontrolled.  Likely 2/2 patient not taking medication this morning. -Will recheck BP -Patient advised to take medicine upon returning home. -Avoid cough/cold medications with decongestant as can elevate BP.  Patient with continued viral URI symptoms due to COVID-19 virus infection.  COVID infection diagnosed on 06/15/2023 while in ED.  Imaging and labs from ED reviewed including CXR, troponin, D-dimer which were negative.  POC UA negative.  Discussed continuing supportive care with OTC cough/cold medications for people with high blood pressure, Tylenol, fluids, rest, gargling with warm salt water/Chloraseptic spray, heat on back, etc.  Discussed possible duration of symptoms.  Note for work to return 06/30/2023.  Given strict precautions.   Follow-up with PCP.  Return if symptoms worsen or fail to improve.   Deeann Saint, MD

## 2023-06-25 ENCOUNTER — Telehealth: Payer: Self-pay | Admitting: Internal Medicine

## 2023-06-25 ENCOUNTER — Telehealth: Payer: Self-pay | Admitting: Family Medicine

## 2023-06-25 NOTE — Telephone Encounter (Signed)
Patient dropped off document  Short Term Disability , to be filled out by provider. Pt saw Dr. Salomon Fick on 06/23/23 for Covid. Patient requested to send it back via Call Patient to pick up and fax within 5-days. Document is located in providers tray at front office.Please advise at Mobile [phone] (mobile)

## 2023-06-26 NOTE — Telephone Encounter (Signed)
Brought the form to Dr. Fabian Sharp. She advise pt to schedule an appointment for the form to be fill as she has not seen the pt.   Attempted to reach pt. Left  a voicemail to contact us back.

## 2023-06-27 NOTE — Telephone Encounter (Signed)
Contacted pt. And inform her of message below.   Pt states she told her insurance that she was seen with Dr. Salomon Fick. Dr. Salomon Fick wrote her out till 06/30/2023. She is not sure if her insurance would accept if the form was filled by Dr. Fabian Sharp.   She prefers Dr. Salomon Fick to fill out the form since she was not seen by Dr. Fabian Sharp. Please advise.

## 2023-06-27 NOTE — Telephone Encounter (Signed)
Schedule for pt a video visit with PCP on 07/01/23.

## 2023-07-01 ENCOUNTER — Telehealth (INDEPENDENT_AMBULATORY_CARE_PROVIDER_SITE_OTHER): Payer: BC Managed Care – PPO | Admitting: Internal Medicine

## 2023-07-01 ENCOUNTER — Encounter: Payer: Self-pay | Admitting: Internal Medicine

## 2023-07-01 ENCOUNTER — Telehealth: Payer: Self-pay | Admitting: Internal Medicine

## 2023-07-01 DIAGNOSIS — R5383 Other fatigue: Secondary | ICD-10-CM | POA: Diagnosis not present

## 2023-07-01 DIAGNOSIS — U071 COVID-19: Secondary | ICD-10-CM

## 2023-07-01 NOTE — Progress Notes (Signed)
Virtual Visit via Video Note  I connected with [name] on 07/01/23 at  8:45 AM EDT by a video enabled telemedicine application and verified that I am speaking with the correct person using two identifiers. Location patient: home Location provider:work or  office Persons participating in the virtual visit: patient, provider   Patient aware  of the limitations of evaluation and management by telemedicine and  availability of in person appointments. and agreed to proceed.   HPI: [name] presents for video visit    fu covid sx prolonged  . Began having sx 8 9 24  with st and the fatigue malaise and weak feeling  ( no asthma falre)  was evaluated in Ed x 2  8/11 dx covid 19 infection and 8 13  . Evaluated  fully checking fro complications  with neg clot evaluation  and had fu in office Dr Salomon Fick .   Written out of work until August 26  returned to work yesterday . Feeling very tired and wobbling but no current vomiting diarrhea or acute dyspnea  .  Over all fever lasted about 3 days .  This is her 4th covid 19 infection  Feels about 30+ % better  Needs  form for out of work time  ROS: See pertinent positives and negatives per HPI. Lbp on left side ;sleeping a lit   Past Medical History:  Diagnosis Date   Allergic reaction caused by a drug 01/25/2013   probably.    Allergy    Anemia    Arthritis    Asthma    GERD (gastroesophageal reflux disease)    Heart murmur    Hypertension    Positive TB test    Shingles    TOBACCO USE 03/22/2009   Qualifier: Diagnosis of  By: Fabian Sharp MD, Neta Mends  Stopped Dec 12 2010      Past Surgical History:  Procedure Laterality Date   ABDOMINAL HYSTERECTOMY     BREAST SURGERY     cyst from left breast   BUNIONECTOMY     both feet   CHOLECYSTECTOMY     CYSTECTOMY     left shoulder   KNEE SURGERY Left    PANENDOSCOPY     TONSILLECTOMY     TUBAL LIGATION     uterine ablastion     and polyps removed   Uterine polyps      Family  History  Problem Relation Age of Onset   Hypertension Mother    Alzheimer's disease Mother    Hypertension Father    Prostate cancer Father    HIV Brother    Lupus Sister    Colon cancer Paternal Grandmother    Esophageal cancer Neg Hx    Liver cancer Neg Hx    Pancreatic cancer Neg Hx    Rectal cancer Neg Hx    Stomach cancer Neg Hx     Social History   Tobacco Use   Smoking status: Former    Current packs/day: 0.00    Average packs/day: 0.5 packs/day for 17.0 years (8.5 ttl pk-yrs)    Types: Cigarettes    Start date: 12/12/1993    Quit date: 12/12/2010    Years since quitting: 12.5   Smokeless tobacco: Never  Vaping Use   Vaping status: Never Used  Substance Use Topics   Alcohol use: No   Drug use: No      Current Outpatient Medications:    albuterol (PROVENTIL) (2.5 MG/3ML) 0.083% nebulizer solution, Inhale  3 mLs (2.5 mg total) by nebulization every 6 (six) hours as needed for wheezing or shortness of breath., Disp: 125 mL, Rfl: 12   Albuterol-Budesonide (AIRSUPRA) 90-80 MCG/ACT AERO, Inhale 2 Inhalations into the lungs every 4 (four) hours as needed., Disp: 11 g, Rfl: 1   BREZTRI AEROSPHERE 160-9-4.8 MCG/ACT AERO, Inhale 2 puffs into the lungs in the morning and at bedtime., Disp: 32.1 g, Rfl: 1   cetirizine (ZYRTEC) 10 MG tablet, Take 1 tablet (10 mg total) by mouth daily as needed for allergies (Can take an extra dose during flare ups.)., Disp: 60 tablet, Rfl: 5   Cyanocobalamin (VITAMIN B-12 PO), Take by mouth daily., Disp: , Rfl:    EPINEPHrine 0.3 mg/0.3 mL IJ SOAJ injection, Inject 0.3 mg into the muscle as needed for anaphylaxis., Disp: 2 each, Rfl: 1   esomeprazole (NEXIUM) 40 MG capsule, Take 1 capsule (40 mg total) by mouth in the morning and at bedtime., Disp: 180 capsule, Rfl: 1   famotidine (PEPCID) 40 MG tablet, Take 1 tablet (40 mg total) by mouth at bedtime., Disp: 90 tablet, Rfl: 1   fluticasone (FLONASE) 50 MCG/ACT nasal spray, Place 1 spray into both  nostrils in the morning and at bedtime., Disp: 48 g, Rfl: 1   hydrochlorothiazide (HYDRODIURIL) 25 MG tablet, TAKE 1 TABLET (25 MG TOTAL) BY MOUTH DAILY., Disp: 90 tablet, Rfl: 0   ondansetron (ZOFRAN-ODT) 4 MG disintegrating tablet, Take 1 tablet (4 mg total) by mouth every 8 (eight) hours as needed for nausea or vomiting., Disp: 10 tablet, Rfl: 0   Respiratory Therapy Supplies (NEBULIZER MASK ADULT) MISC, 1 kit by Does not apply route as directed., Disp: 1 each, Rfl: 1   Spacer/Aero-Holding Chambers DEVI, 1 Device by Does not apply route as directed., Disp: 1 each, Rfl: 1   valsartan (DIOVAN) 320 MG tablet, TAKE 1/2 TABLETS (160 MG TOTAL) BY MOUTH DAILY., Disp: 45 tablet, Rfl: 1   VITAMIN D PO, Take by mouth., Disp: , Rfl:    Zinc 25 MG TABS, Take by mouth daily. (Patient not taking: Reported on 07/01/2023), Disp: , Rfl:   EXAM: BP Readings from Last 3 Encounters:  06/23/23 (!) 152/100  06/17/23 129/85  06/15/23 132/85    VITALS per patient if applicable:  GENERAL: alert, oriented, appears well and in no acute distress tired appear no dyspnea non toxic   HEENT: atraumatic, conjunttiva clear, no obvious abnormalities on inspection of external nose and ears  NECK: normal movements of the head and neck  LUNGS: on inspection no signs of respiratory distress, breathing rate appears normal, no obvious gross SOB, gasping or wheezing  CV: no obvious cyanosis  MS: moves all visible extremities without noticeable abnormality  PSYCH/NEURO: pleasant and cooperative, no obvious depression or anxiety, speech and thought processing grossly intact Lab Results  Component Value Date   WBC 3.8 (L) 06/17/2023   HGB 13.3 06/17/2023   HCT 40.5 06/17/2023   PLT 283 06/17/2023   GLUCOSE 85 06/17/2023   CHOL 195 05/19/2018   TRIG 41.0 05/19/2018   HDL 83.80 05/19/2018   LDLCALC 103 (H) 05/19/2018   ALT 11 06/17/2023   AST 17 06/17/2023   NA 138 06/17/2023   K 3.4 (L) 06/17/2023   CL 102  06/17/2023   CREATININE 0.95 06/17/2023   BUN 12 06/17/2023   CO2 26 06/17/2023   TSH 0.97 05/18/2021   INR 1.0 01/17/2021   MICROALBUR 0.7 05/30/2009    ASSESSMENT AND PLAN:  Discussed  the following assessment and plan:    ICD-10-CM   1. COVID-19 virus infection  U07.1     2. Other fatigue  R53.83    related to covid      Counseled.  Actively as tolerated no new obvious complication   Bp control Will complete  work dis form  to return 8 26 and work with supervision   After patient visit  see message  sent home by supervisor   cause of  sx . Will delay her RTW until next week Sept 3 Tues.   Expectant management and discussion of plan and treatment with opportunity to ask questions and all were answered. The patient agreed with the plan and demonstrated an understanding of the instructions.   Advised to call back or seek an in-person evaluation if worsening  or having  further concerns  in interim. Return for as indicated.    Berniece Andreas, MD

## 2023-07-01 NOTE — Telephone Encounter (Signed)
Ok  I understand   we can write her out until next Tuesday Sept 3 ( Monday is a holiday)  on the disability form. Hopefully will continue to improve by then

## 2023-07-01 NOTE — Telephone Encounter (Signed)
Pt was just seen by MD today. Pt states employer sent her home and asked her to take time off.  Pt thinks this will effect her paperwork, discussed during today's visit.  Pt would like a call back at your earliest convenience to discuss.

## 2023-07-01 NOTE — Telephone Encounter (Signed)
Spoke to pt. Inform her of updates from Dr. Fabian Sharp. Advise pt to let us know if she needs anything. Verbalized understanding.

## 2023-07-08 ENCOUNTER — Telehealth: Payer: Self-pay

## 2023-07-08 NOTE — Telephone Encounter (Signed)
New FMLA form for patient. Pt states her workplace requested for her absence last week.   Form placed in provider red folder. Please advise.

## 2023-07-09 NOTE — Telephone Encounter (Signed)
Please collect the dates fore the form  since this is a disability form and not fmla form

## 2023-07-10 NOTE — Telephone Encounter (Signed)
Spoke to pt and collected dates info. Form placed in red folder.  Pt requests to give her a call after form is fax. She will pick it up along with previous FMLA form.   Inform pt we will follow up with her.

## 2023-07-16 NOTE — Telephone Encounter (Signed)
I singed and did as best possible   Please add ein number and she will need to sign her part.  Copy for record

## 2023-07-17 NOTE — Telephone Encounter (Signed)
The form was faxed successfully.   Reports to pt of it. Also inform pt forms are ready for her to pick up at the front desk. Verbalized understanding.   She states she will pick it up tomorrow.

## 2023-08-18 ENCOUNTER — Encounter (HOSPITAL_BASED_OUTPATIENT_CLINIC_OR_DEPARTMENT_OTHER): Payer: Self-pay | Admitting: Emergency Medicine

## 2023-08-18 ENCOUNTER — Emergency Department (HOSPITAL_BASED_OUTPATIENT_CLINIC_OR_DEPARTMENT_OTHER)
Admission: EM | Admit: 2023-08-18 | Discharge: 2023-08-18 | Disposition: A | Discharge status: Home / Self Care | Payer: BC Managed Care – PPO | Attending: Emergency Medicine | Admitting: Emergency Medicine

## 2023-08-18 ENCOUNTER — Emergency Department (HOSPITAL_BASED_OUTPATIENT_CLINIC_OR_DEPARTMENT_OTHER): Payer: BC Managed Care – PPO

## 2023-08-18 ENCOUNTER — Other Ambulatory Visit: Payer: Self-pay

## 2023-08-18 DIAGNOSIS — J40 Bronchitis, not specified as acute or chronic: Secondary | ICD-10-CM | POA: Diagnosis not present

## 2023-08-18 DIAGNOSIS — I77819 Aortic ectasia, unspecified site: Secondary | ICD-10-CM | POA: Diagnosis not present

## 2023-08-18 DIAGNOSIS — R0602 Shortness of breath: Secondary | ICD-10-CM | POA: Diagnosis not present

## 2023-08-18 DIAGNOSIS — I771 Stricture of artery: Secondary | ICD-10-CM | POA: Diagnosis not present

## 2023-08-18 DIAGNOSIS — R918 Other nonspecific abnormal finding of lung field: Secondary | ICD-10-CM | POA: Diagnosis not present

## 2023-08-18 DIAGNOSIS — J45901 Unspecified asthma with (acute) exacerbation: Secondary | ICD-10-CM

## 2023-08-18 LAB — D-DIMER, QUANTITATIVE: D-Dimer, Quant: 0.31 ug{FEU}/mL (ref 0.00–0.50)

## 2023-08-18 LAB — CBC WITH DIFFERENTIAL/PLATELET
Abs Immature Granulocytes: 0 10*3/uL (ref 0.00–0.07)
Basophils Absolute: 0 10*3/uL (ref 0.0–0.1)
Basophils Relative: 1 %
Eosinophils Absolute: 0.2 10*3/uL (ref 0.0–0.5)
Eosinophils Relative: 5 %
HCT: 36.3 % (ref 36.0–46.0)
Hemoglobin: 12 g/dL (ref 12.0–15.0)
Immature Granulocytes: 0 %
Lymphocytes Relative: 47 %
Lymphs Abs: 2.4 10*3/uL (ref 0.7–4.0)
MCH: 27.6 pg (ref 26.0–34.0)
MCHC: 33.1 g/dL (ref 30.0–36.0)
MCV: 83.4 fL (ref 80.0–100.0)
Monocytes Absolute: 0.4 10*3/uL (ref 0.1–1.0)
Monocytes Relative: 7 %
Neutro Abs: 1.9 10*3/uL (ref 1.7–7.7)
Neutrophils Relative %: 40 %
Platelets: 275 10*3/uL (ref 150–400)
RBC: 4.35 MIL/uL (ref 3.87–5.11)
RDW: 15.5 % (ref 11.5–15.5)
WBC: 4.9 10*3/uL (ref 4.0–10.5)
nRBC: 0 % (ref 0.0–0.2)

## 2023-08-18 LAB — BASIC METABOLIC PANEL
Anion gap: 10 (ref 5–15)
BUN: 13 mg/dL (ref 6–20)
CO2: 26 mmol/L (ref 22–32)
Calcium: 9.1 mg/dL (ref 8.9–10.3)
Chloride: 103 mmol/L (ref 98–111)
Creatinine, Ser: 1.12 mg/dL — ABNORMAL HIGH (ref 0.44–1.00)
GFR, Estimated: 58 mL/min — ABNORMAL LOW (ref >60)
Glucose, Bld: 118 mg/dL — ABNORMAL HIGH (ref 70–99)
Potassium: 2.9 mmol/L — ABNORMAL LOW (ref 3.5–5.1)
Sodium: 139 mmol/L (ref 135–145)

## 2023-08-18 LAB — TROPONIN I (HIGH SENSITIVITY)
Troponin I (High Sensitivity): 3 ng/L (ref <18)
Troponin I (High Sensitivity): 3 ng/L (ref <18)

## 2023-08-18 MED ORDER — IPRATROPIUM-ALBUTEROL 0.5-2.5 (3) MG/3ML IN SOLN: 3.0000 mL | Freq: Once | RESPIRATORY_TRACT | Status: AC

## 2023-08-18 MED ORDER — IPRATROPIUM-ALBUTEROL 0.5-2.5 (3) MG/3ML IN SOLN
RESPIRATORY_TRACT | Status: AC
  Administered 2023-08-18: 3 mL via RESPIRATORY_TRACT
  Filled 2023-08-18: qty 3

## 2023-08-18 MED ORDER — POTASSIUM CHLORIDE CRYS ER 20 MEQ PO TBCR
40.0000 meq | EXTENDED_RELEASE_TABLET | Freq: Once | ORAL | Status: AC
  Administered 2023-08-18: 40 meq via ORAL
  Filled 2023-08-18: qty 2

## 2023-08-18 MED ORDER — PREDNISONE 20 MG PO TABS: 40.0000 mg | ORAL_TABLET | Freq: Every day | ORAL | 0 refills | Status: AC

## 2023-08-18 MED ORDER — IPRATROPIUM-ALBUTEROL 0.5-2.5 (3) MG/3ML IN SOLN
3.0000 mL | Freq: Once | RESPIRATORY_TRACT | Status: AC
  Administered 2023-08-18: 3 mL via RESPIRATORY_TRACT
  Filled 2023-08-18: qty 3

## 2023-08-18 MED ORDER — POTASSIUM CHLORIDE CRYS ER 20 MEQ PO TBCR: 20.0000 meq | EXTENDED_RELEASE_TABLET | Freq: Two times a day (BID) | ORAL | 0 refills | Status: DC

## 2023-08-18 MED ORDER — METHYLPREDNISOLONE SODIUM SUCC 125 MG IJ SOLR
125.0000 mg | Freq: Once | INTRAMUSCULAR | Status: AC
  Administered 2023-08-18: 125 mg via INTRAVENOUS
  Filled 2023-08-18: qty 2

## 2023-08-18 MED ORDER — POTASSIUM CHLORIDE CRYS ER 20 MEQ PO TBCR: 80.0000 meq | EXTENDED_RELEASE_TABLET | Freq: Once | ORAL | Status: DC

## 2023-08-18 NOTE — ED Triage Notes (Signed)
Was out for a walk and then she was done she got in car and she felt like she was sob used her inhaler  and  felt had a balloon in her chest pt is anxious 98 percent ra

## 2023-08-18 NOTE — ED Notes (Signed)
Fall Risk armband Fall risk sign on door Patient wearing shoes

## 2023-08-18 NOTE — ED Provider Notes (Signed)
[place] [facility] Provider Note   CSN: [id number] Arrival date & time: 08/18/23  1112     History  Chief Complaint  Patient presents with   Shortness of Breath    [name] is a 57 y.o. female.  Patient here with shortness of breath.  Recent COVID infection.  Is feeling better.  She had some asthma type symptoms yesterday briefly and then another episode again today.  Used her inhaler with any get great relief.  Does feel like she is having a hard time taking a deep breath and feels like her asthma but she is concerned.  She denies any fever chills or sputum production.  Denies any weakness numbness tingling.  No cardiac history or cardiac issues in the family.  History of hypertension.  Used to smoke.  The history is provided by the patient.       Home Medications Prior to Admission medications   Medication Sig Start Date End Date Taking? Authorizing Provider  potassium chloride SA (KLOR-CON M) 20 MEQ tablet Take 1 tablet (20 mEq total) by mouth 2 (two) times daily for 3 days. 08/18/23 08/21/23 Yes Ryland Smoots, DO  predniSONE (DELTASONE) 20 MG tablet Take 2 tablets (40 mg total) by mouth daily for 4 days. 08/18/23 08/22/23 Yes Cari Vandeberg, DO  albuterol (PROVENTIL) (2.5 MG/3ML) 0.083% nebulizer solution Inhale 3 mLs (2.5 mg total) by nebulization every 6 (six) hours as needed for wheezing or shortness of breath. 05/06/23   Kozlow, Alvira Philips, MD  Albuterol-Budesonide (AIRSUPRA) 90-80 MCG/ACT AERO Inhale 2 Inhalations into the lungs every 4 (four) hours as needed. 05/06/23   Kozlow, Alvira Philips, MD  BREZTRI AEROSPHERE 160-9-4.8 MCG/ACT AERO Inhale 2 puffs into the lungs in the morning and at bedtime. 05/06/23   Kozlow, Alvira Philips, MD  cetirizine (ZYRTEC) 10 MG tablet Take 1 tablet (10 mg total) by mouth daily as needed for allergies (Can take an extra dose during flare ups.). 05/06/23   Kozlow, Alvira Philips, MD  Cyanocobalamin (VITAMIN B-12 PO) Take by mouth  daily.    [provider]  EPINEPHrine 0.3 mg/0.3 mL IJ SOAJ injection Inject 0.3 mg into the muscle as needed for anaphylaxis. 05/06/23   Kozlow, Alvira Philips, MD  esomeprazole (NEXIUM) 40 MG capsule Take 1 capsule (40 mg total) by mouth in the morning and at bedtime. 05/06/23   Kozlow, Alvira Philips, MD  famotidine (PEPCID) 40 MG tablet Take 1 tablet (40 mg total) by mouth at bedtime. 05/06/23   Kozlow, Alvira Philips, MD  fluticasone (FLONASE) 50 MCG/ACT nasal spray Place 1 spray into both nostrils in the morning and at bedtime. 05/06/23   Kozlow, Alvira Philips, MD  hydrochlorothiazide (HYDRODIURIL) 25 MG tablet TAKE 1 TABLET (25 MG TOTAL) BY MOUTH DAILY. 06/09/23   [name], Neta Mends, MD  ondansetron (ZOFRAN-ODT) 4 MG disintegrating tablet Take 1 tablet (4 mg total) by mouth every 8 (eight) hours as needed for nausea or vomiting. 06/15/23   Renne Crigler, PA-C  Respiratory Therapy Supplies (NEBULIZER MASK ADULT) MISC 1 kit by Does not apply route as directed. 05/06/23   Kozlow, Alvira Philips, MD  Spacer/Aero-Holding Deretha Emory DEVI 1 Device by Does not apply route as directed. 05/06/23   Kozlow, Alvira Philips, MD  valsartan (DIOVAN) 320 MG tablet TAKE 1/2 TABLETS (160 MG TOTAL) BY MOUTH DAILY. 02/28/23   Worthy Rancher B, FNP  VITAMIN D PO Take by mouth.    [provider]  Zinc 25  MG TABS Take by mouth daily. Patient not taking: Reported on 07/01/2023    [provider]      Allergies    Allegra [fexofenadine]; Cimetidine; Diovan hct [valsartan-hydrochlorothiazide]; Purified water [water, sterile]; Chocolate; Citric acid; Coly-mycin s; and Amlodipine    Review of Systems   Review of Systems  Physical Exam Updated Vital Signs BP 114/86   Pulse 93   Temp 97.6 F (36.4 C)   Resp (!) 21   Ht 5' (1.524 m)   Wt 65.8 kg   SpO2 99%   BMI 28.32 kg/m  Physical Exam Vitals and nursing note reviewed.  Constitutional:      General: She is not in acute distress.    Appearance: She is well-developed. She is not ill-appearing.   HENT:     Head: Normocephalic and atraumatic.     Mouth/Throat:     Mouth: Mucous membranes are moist.  Eyes:     Conjunctiva/sclera: Conjunctivae normal.     Pupils: Pupils are equal, round, and reactive to light.  Cardiovascular:     Rate and Rhythm: Normal rate and regular rhythm.     Pulses: Normal pulses.     Heart sounds: Normal heart sounds. No murmur heard. Pulmonary:     Effort: Pulmonary effort is normal. No respiratory distress.     Breath sounds: Decreased breath sounds and wheezing present.  Abdominal:     Palpations: Abdomen is soft.     Tenderness: There is no abdominal tenderness.  Musculoskeletal:        General: No swelling.     Cervical back: Normal range of motion and neck supple.  Skin:    General: Skin is warm and dry.     Capillary Refill: Capillary refill takes less than 2 seconds.  Neurological:     Mental Status: She is alert.  Psychiatric:        Mood and Affect: Mood normal.     ED Results / Procedures / Treatments   Labs (all labs ordered are listed, but only abnormal results are displayed) Labs Reviewed  BASIC METABOLIC PANEL - Abnormal; Notable for the following components:      Result Value   Potassium 2.9 (*)    Glucose, Bld 118 (*)    Creatinine, Ser 1.12 (*)    GFR, Estimated 58 (*)    All other components within normal limits  CBC WITH DIFFERENTIAL/PLATELET  D-DIMER, QUANTITATIVE  TROPONIN I (HIGH SENSITIVITY)  TROPONIN I (HIGH SENSITIVITY)    EKG EKG Interpretation Date/Time:  Monday August 18 2023 11:26:01 EDT Ventricular Rate:  83 PR Interval:  160 QRS Duration:  95 QT Interval:  411 QTC Calculation: 483 R Axis:   26  Text Interpretation: Sinus rhythm Probable left atrial enlargement Abnormal R-wave progression, early transition Confirmed by Virgina Norfolk [phone]) on 08/18/2023 11:33:23 AM  Radiology DG Chest Portable 1 View  Result Date: 08/18/2023 CLINICAL DATA:  Shortness of breath EXAM: PORTABLE CHEST 1 VIEW  COMPARISON:  06/17/2023 FINDINGS: There is some linear opacity at the left lung base, likely scar or atelectasis. No pneumothorax, effusion. No edema. Normal cardiopericardial silhouette. Tortuous and ectatic aorta. Overlapping cardiac leads. IMPRESSION: Underinflation.  Left basilar scar or atelectasis. Electronically Signed   By: Karen Kays M.D.   On: 08/18/2023 14:35    Procedures Procedures    Medications Ordered in ED Medications  ipratropium-albuterol (DUONEB) 0.5-2.5 (3) MG/3ML nebulizer solution 3 mL (3 mLs Nebulization Given 08/18/23 1143)  ipratropium-albuterol (DUONEB) 0.5-2.5 (3)  MG/3ML nebulizer solution 3 mL (3 mLs Nebulization Given 08/18/23 1149)  methylPREDNISolone sodium succinate (SOLU-MEDROL) 125 mg/2 mL injection 125 mg (125 mg Intravenous Given 08/18/23 1222)  potassium chloride SA (KLOR-CON M) CR tablet 40 mEq (40 mEq Oral Given 08/18/23 1316)    ED Course/ Medical Decision Making/ A&P                                 Medical Decision Making Amount and/or Complexity of Data Reviewed Labs: ordered. Radiology: ordered.  Risk Prescription drug management.   Ralphine R Neaves is here with shortness of breath.  Normal vitals.  No fever.  Little bit of wheezing diminished air movement on exam differential diagnosis likely asthma exacerbation but she just got over COVID and will evaluate for ACS, PE, pneumonia.  I do not think she has a pneumothorax.  Will get CBC BMP chest x-ray D-dimer troponin.  Will give DuoNeb treatment and Solu-Medrol treatment and reevaluate.  No major increased work of breathing.  No hypoxia.  Lab work per my review and interpretation is unremarkable.  However, potassium 2.9 repleted.  Will replete with prescription as well.  Chest x-ray shows no evidence of pneumonia or pneumothorax per my review interpretation.  Overall no evidence of pneumonia per radiology report as well.  Troponin negative x 2.  D-dimer normal.  Have no concern for ACS or PE.   Overall suspect asthma exacerbation.  Will prescribe prednisone and potassium.  Understands return precautions.  Discharged in good condition.  This chart was dictated using voice recognition software.  Despite best efforts to proofread,  errors can occur which can change the documentation meaning.         Final Clinical Impression(s) / ED Diagnoses Final diagnoses:  Bronchitis  Mild asthma with exacerbation, unspecified whether persistent    Rx / DC Orders ED Discharge Orders          Ordered    predniSONE (DELTASONE) 20 MG tablet  Daily        08/18/23 1323    potassium chloride SA (KLOR-CON M) 20 MEQ tablet  2 times daily        08/18/23 1323              Virgina Norfolk, DO 08/18/23 1440

## 2023-08-18 NOTE — Discharge Instructions (Addendum)
Take next dose of steroid tomorrow.  I have prescribed you some potassium to take as well as when you use a lot of inhaler it does make your potassium levels lowered this should help support your potassium.  Lab work today was unremarkable.    Chest x-ray read by radiologist just after we talked is unremarkable.  There is no pneumonia or lung infection.  Overall I think that this is asthma exacerbation.  Follow-up with your primary care doctor.  Please return if symptoms worsen.

## 2023-08-20 ENCOUNTER — Encounter: Payer: Self-pay | Admitting: Family Medicine

## 2023-08-20 ENCOUNTER — Ambulatory Visit (INDEPENDENT_AMBULATORY_CARE_PROVIDER_SITE_OTHER): Payer: BC Managed Care – PPO | Admitting: Family Medicine

## 2023-08-20 VITALS — BP 130/90 | HR 72 | Temp 98.5°F | Wt 143.0 lb

## 2023-08-20 DIAGNOSIS — J45991 Cough variant asthma: Secondary | ICD-10-CM

## 2023-08-20 MED ORDER — PREDNISONE 10 MG PO TABS: ORAL_TABLET | ORAL | 0 refills | Status: DC

## 2023-08-20 NOTE — Progress Notes (Signed)
Subjective:    Patient ID: [name], female    DOB: [birth year], 57 y.o.   MRN: [id number]  HPI Here to follow up an ED visit  on 08-18-23 for a dry cough, wheezing, and SOB. No fever. She has asthma, and she sees Dr. Laurette Schimke for this. She says her asthm ahad been fairly well controlled until she had a Covid infection in August. Her breathing has been a problem ever since then. At the ED her exam revealed some wheezing. Labs did not indicate an infection. The CXR showed some atectasis on the left base but no acute issues. She was treated with a nebulizer treatment and she was given a 4 day course of Prednisone at 40 mg a day. She says she felt better for a few days after that, but then she felt worse again. She using her Albuterol-Budesonide inhaler. She has a nebulizer at home but she has not used it lately.    Review of Systems  Constitutional: Negative.   HENT: Negative.    Respiratory:  Positive for cough, chest tightness, shortness of breath and wheezing.   Cardiovascular: Negative.        Objective:   Physical Exam Constitutional:      Appearance: Normal appearance. She is not ill-appearing.  Cardiovascular:     Rate and Rhythm: Normal rate and regular rhythm.     Pulses: Normal pulses.     Heart sounds: Normal heart sounds.  Pulmonary:     Effort: Pulmonary effort is normal.     Breath sounds: Wheezing present. No rhonchi or rales.  Neurological:     Mental Status: She is alert.           Assessment & Plan:  This is an exacerbation of her chronic asthma. We will give her a 20 day taper of Prednisone beginning at 40 mg daily. We wrote her out of work today through 08-24-23. I advised her to make an appt to see Dr. Lucie Leather sometimes soon. We spent a total of (35   ) minutes reviewing records and discussing these issues.  Gershon Crane, MD

## 2023-08-27 ENCOUNTER — Ambulatory Visit: Payer: BC Managed Care – PPO | Admitting: Family Medicine

## 2023-08-27 ENCOUNTER — Ambulatory Visit: Payer: BC Managed Care – PPO | Admitting: Internal Medicine

## 2023-08-27 ENCOUNTER — Encounter: Payer: Self-pay | Admitting: Internal Medicine

## 2023-08-27 ENCOUNTER — Other Ambulatory Visit: Payer: Self-pay

## 2023-08-27 VITALS — BP 132/86 | HR 72 | Temp 98.2°F | Wt 147.1 lb

## 2023-08-27 DIAGNOSIS — J302 Other seasonal allergic rhinitis: Secondary | ICD-10-CM | POA: Diagnosis not present

## 2023-08-27 DIAGNOSIS — J4550 Severe persistent asthma, uncomplicated: Secondary | ICD-10-CM

## 2023-08-27 DIAGNOSIS — J3089 Other allergic rhinitis: Secondary | ICD-10-CM | POA: Diagnosis not present

## 2023-08-27 MED ORDER — AZELASTINE HCL 0.1 % NA SOLN: 1.0000 | Freq: Two times a day (BID) | NASAL | 5 refills | Status: AC | PRN

## 2023-08-27 MED ORDER — AIRSUPRA 90-80 MCG/ACT IN AERO: 2.0000 | INHALATION_SPRAY | RESPIRATORY_TRACT | 1 refills | Status: DC | PRN

## 2023-08-27 MED ORDER — BREZTRI AEROSPHERE 160-9-4.8 MCG/ACT IN AERO
2.0000 | INHALATION_SPRAY | Freq: Two times a day (BID) | RESPIRATORY_TRACT | 1 refills | Status: DC
Start: 2023-08-27 — End: 2023-10-14

## 2023-08-27 NOTE — Patient Instructions (Addendum)
  1.  Allergen avoidance measures - dust mite, pollens, foods?  2.  Treat and prevent inflammation: Finish the course of prednisone given by Dr Aretta Nip - 2 inhalations 2 times per day w/spacer (empty lungs first) B. Flonase - 1 spray each nostril 1-2 times per day  3.  Treat and prevent LPR:  A. Minimize caffeine consumption B. Nexium 40 mg - 1 tablet 1-2 times per day C. Famotidine 40 mg - 1 tablet in evening D. Replace all throat clearing with drinking/swallowing maneuver  4.  If needed:  A. AIRSUPRA - 2 inhalations or nebulizer every 4-6 hours B. Cetirizine 10 mg - 1 tablet 1 time per day C. Epi-Pen, benadryl, MD/ER evaluation for allergic reaction D. Azelastine 1-2 sprays each nostril twice daily as needed   5.  Return to clinic around January 2025

## 2023-08-27 NOTE — Progress Notes (Signed)
FOLLOW UP Date of Service/Encounter:  08/27/23   Subjective:  [name] (DOB: [birth year]) is a 57 y.o. female [facility] for follow up for an acute visit.   History obtained from: chart review and patient. Last seen by Dr Lucie Leather on 05/06/2023 and at the time was doing well with her asthma, reflux and rhinitis.   Had COVID in middle of August and since then her breathing has been off.  Previously was walking 3 miles a day but now gets very easily winded from just walking short distances.  Also noted a lot of chest tightness and intermittent wheezing.  She is using Breztri 2 puffs daily.   Seen in ED in October and diagnosed with bronchitis and treated with duoneb + Solumedrol and continuation of oral prednisone at home.  CXR with some atelectasis (seen previously).  Then saw Dr Clent Ridges recently about a week ago and was noted to be wheezing so started on long taper of prednisone.  She is feeling a little better with this slowly.  Using [employer] as rescue multiple times a week.   Also noted to have lots of rhinorrhea, post nasal drip and congestion.  Taking Flonase. Reports allergy to purified water, unable to do rinses.    Past Medical History: Past Medical History:  Diagnosis Date   Allergic reaction caused by a drug 01/25/2013   probably.    Allergy    Anemia    Arthritis    Asthma    GERD (gastroesophageal reflux disease)    Heart murmur    Hypertension    Positive TB test    Shingles    TOBACCO USE 03/22/2009   Qualifier: Diagnosis of  By: Fabian Sharp MD, Neta Mends  Stopped Dec 12 2010      Objective:  BP 132/86   Pulse 72   Temp 98.2 F (36.8 C) (Temporal)   Wt 147 lb 1.6 oz (66.7 kg)   SpO2 97%   BMI 28.73 kg/m  Body mass index is 28.73 kg/m. Physical Exam: GEN: alert, well developed HEENT: clear conjunctiva, TM grey and translucent, nose with moderate inferior turbinate hypertrophy, pink nasal mucosa, clear rhinorrhea, +  cobblestoning HEART: regular rate and rhythm, no murmur LUNGS: clear to auscultation bilaterally, no coughing, unlabored respiration SKIN: no rashes or lesions  Spirometry:  Tracings reviewed. Her effort: It was hard to get consistent efforts and there is a question as to whether this reflects a maximal maneuver. FVC: 1.45L, 59% FEV1: 1.12L, 57% predicted FEV1/FVC ratio: 77% Interpretation: Spirometry consistent with possible restrictive disease.  Please see scanned spirometry results for details.  Assessment:   1. Not well controlled severe persistent asthma   2. Seasonal and perennial allergic rhinitis     Plan/Recommendations:   Persistent symptoms since COVID.  Spirometry today with poor-suboptimal effort.  Did discussed completing the course of prednisone given by PCP.  Can take time for recovery and did inform her to slowly increase her physical activity rather than going back to 3 miles/day.  Also uptitrating dose of Breztri to scheduled BID and adding Azelastine to help with drainage.  Can also try incentive spirometry.  Did discuss possibility of post COVID but we will see how she does at next visit.   MDI teaching done.   1.  Allergen avoidance measures - dust mite, pollens, foods?  2.  Treat and prevent inflammation: Finish the course of prednisone given by Dr Aretta Nip -  2 inhalations 2 times per day w/spacer (empty lungs first) B. Flonase - 1 spray each nostril 1-2 times per day  3.  Treat and prevent LPR:  A. Minimize caffeine consumption B. Nexium 40 mg - 1 tablet 1-2 times per day C. Famotidine 40 mg - 1 tablet in evening D. Replace all throat clearing with drinking/swallowing maneuver  4.  If needed:  A. AIRSUPRA - 2 inhalations or nebulizer every 4-6 hours B. Cetirizine 10 mg - 1 tablet 1 time per day C. Epi-Pen, benadryl, MD/ER evaluation for allergic reaction D. Azelastine 1-2 sprays each nostril twice daily as needed   5.  Return to clinic around  December as scheduled.     No follow-ups on file.  Alesia Morin, MD [facility]

## 2023-09-05 ENCOUNTER — Other Ambulatory Visit: Payer: Self-pay | Admitting: Internal Medicine

## 2023-10-14 ENCOUNTER — Ambulatory Visit (INDEPENDENT_AMBULATORY_CARE_PROVIDER_SITE_OTHER): Payer: BC Managed Care – PPO | Admitting: Allergy and Immunology

## 2023-10-14 VITALS — BP 118/70 | HR 86 | Temp 98.1°F | Resp 14 | Ht 61.0 in | Wt 152.5 lb

## 2023-10-14 DIAGNOSIS — J3089 Other allergic rhinitis: Secondary | ICD-10-CM

## 2023-10-14 DIAGNOSIS — K219 Gastro-esophageal reflux disease without esophagitis: Secondary | ICD-10-CM

## 2023-10-14 DIAGNOSIS — J4550 Severe persistent asthma, uncomplicated: Secondary | ICD-10-CM

## 2023-10-14 DIAGNOSIS — J301 Allergic rhinitis due to pollen: Secondary | ICD-10-CM

## 2023-10-14 MED ORDER — ESOMEPRAZOLE MAGNESIUM 40 MG PO CPDR: 40.0000 mg | DELAYED_RELEASE_CAPSULE | Freq: Two times a day (BID) | ORAL | 1 refills | Status: DC

## 2023-10-14 MED ORDER — AIRSUPRA 90-80 MCG/ACT IN AERO: 2.0000 | INHALATION_SPRAY | RESPIRATORY_TRACT | 1 refills | Status: DC | PRN

## 2023-10-14 MED ORDER — CETIRIZINE HCL 10 MG PO TABS: 10.0000 mg | ORAL_TABLET | Freq: Every day | ORAL | 1 refills | Status: DC | PRN

## 2023-10-14 MED ORDER — FAMOTIDINE 40 MG PO TABS: 40.0000 mg | ORAL_TABLET | Freq: Every evening | ORAL | 1 refills | Status: DC

## 2023-10-14 MED ORDER — BREZTRI AEROSPHERE 160-9-4.8 MCG/ACT IN AERO: 2.0000 | INHALATION_SPRAY | Freq: Two times a day (BID) | RESPIRATORY_TRACT | 1 refills | Status: DC

## 2023-10-14 MED ORDER — FLUTICASONE PROPIONATE 50 MCG/ACT NA SUSP: 1.0000 | Freq: Two times a day (BID) | NASAL | 1 refills | Status: DC

## 2023-10-14 NOTE — Patient Instructions (Addendum)
  1.  Allergen avoidance measures - dust mite, pollens, foods?   2.  Treat and prevent inflammation:  A. Breztri - 2 inhalations 2 times per day w/spacer (empty lungs) B. Flonase - 1 spray each nostril 1-2 times per day C. Submit for Tezepelumab administration  3.  Treat and prevent LPR:  A. Minimize caffeine consumption B. Nexium 40 mg - 1 tablet 1-2 times per day C. Famotidine 40 mg - 1 tablet in evening D. Replace all throat clearing with drinking/swallowing maneuver  4.  If needed:  A. AIRSUPRA - 2 inhalations or nebulizer every 4-6 hours B. Cetirizine 10 mg - 1 tablet 1 time per day C. Epi-Pen, benadryl, MD/ER evaluation for allergic reaction  5.  Return to clinic in 6 months or earlier if problem  6. Covid = Paxlovid, Influenza = Tamiflu

## 2023-10-14 NOTE — Progress Notes (Unsigned)
[name] - [place] - [place] - [place] - [place]   Follow-up Note  Referring Provider: [name], MD Primary Provider: [name], MD Date of Office Visit: 10/14/2023  Subjective:   [name] (DOB: 09-26-1966) is a 57 y.o. female who [facility] in re-evaluation of the following:  HPI: [name] returns to this clinic in evaluation of asthma, allergic rhinitis, LPR, vocal cord dysfunction, eosinophilia, food allergy.  I last saw her in this clinic 06 May 2023.  She did visit with Dr. Allena Katz on 27 June 2023 for follow-up of a respiratory tract flare apparently triggered by a viral respiratory tract infection.    Since I have seen her in the clinic she is required to systemic steroids for respiratory tract flareups 1 precipitate By COVID any other with unknown trigger.  She does resolve each 1 of these episodes usually after using a prolonged course of systemic steroids and these events occur while she is consistently using her anti-inflammatory medications for her airway on a regular basis.  Her reflux and her throat issues under excellent control on her current plan of a proton pump inhibitor and H2 receptor blocker.  She does not receive the flu vaccine.  Allergies as of 10/14/2023       Reactions   Purified Water [water, Sterile] Anaphylaxis   Allegra [fexofenadine] Hives   Chocolate Other (See Comments)   triggers Asthma   Cimetidine Hives   Citric Acid Other (See Comments)   REACTION: swelling, hives, itching   Coly-mycin S Other (See Comments)   REACTION: pt reports she passed out   Diovan Hct [valsartan-hydrochlorothiazide] Swelling   Had facial swelling and throat itching after 2 doses of the generic had done well on the brand medicine for a while.  See ED visit February 2014   Amlodipine Other (See Comments)   At 5 mg dose; swelling        Medication List    Airsupra 90-80 MCG/ACT Aero Generic  drug: Albuterol-Budesonide Inhale 2 Inhalations into the lungs every 4 (four) hours as needed.   albuterol (2.5 MG/3ML) 0.083% nebulizer solution Commonly known as: PROVENTIL Inhale 3 mLs (2.5 mg total) by nebulization every 6 (six) hours as needed for wheezing or shortness of breath.   azelastine 0.1 % nasal spray Commonly known as: ASTELIN Place 1 spray into both nostrils 2 (two) times daily as needed. Use in each nostril as directed   Breztri Aerosphere 160-9-4.8 MCG/ACT Aero Generic drug: Budeson-Glycopyrrol-Formoterol Inhale 2 puffs into the lungs in the morning and at bedtime.   cetirizine 10 MG tablet Commonly known as: ZYRTEC Take 1 tablet (10 mg total) by mouth daily as needed for allergies (Can take an extra dose during flare ups.).   EPINEPHrine 0.3 mg/0.3 mL Soaj injection Commonly known as: EPI-PEN Inject 0.3 mg into the muscle as needed for anaphylaxis.   esomeprazole 40 MG capsule Commonly known as: NEXIUM Take 1 capsule (40 mg total) by mouth in the morning and at bedtime.   famotidine 40 MG tablet Commonly known as: PEPCID Take 1 tablet (40 mg total) by mouth at bedtime.   fluticasone 50 MCG/ACT nasal spray Commonly known as: FLONASE Place 1 spray into both nostrils in the morning and at bedtime.   hydrochlorothiazide 25 MG tablet Commonly known as: HYDRODIURIL TAKE 1 TABLET (25 MG TOTAL) BY MOUTH DAILY.   Nebulizer Mask Adult Misc 1 kit by Does not apply route as  directed.   Spacer/Aero-Holding [employer] 1 Device by Does not apply route as directed.   valsartan 320 MG tablet Commonly known as: DIOVAN TAKE 1/2 TABLETS (160 MG TOTAL) BY MOUTH DAILY.   VITAMIN B-12 PO Take by mouth daily.   VITAMIN D PO Take by mouth.    Past Medical History:  Diagnosis Date   Allergic reaction caused by a drug 01/25/2013   probably.    Allergy    Anemia    Arthritis    Asthma    GERD (gastroesophageal reflux disease)    Heart murmur    Hypertension     Positive TB test    Shingles    TOBACCO USE 03/22/2009   Qualifier: Diagnosis of  By: Fabian Sharp MD, Neta Mends  Stopped Dec 12 2010      Past Surgical History:  Procedure Laterality Date   ABDOMINAL HYSTERECTOMY     BREAST SURGERY     cyst from left breast   BUNIONECTOMY     both feet   CHOLECYSTECTOMY     CYSTECTOMY     left shoulder   KNEE SURGERY Left    PANENDOSCOPY     TONSILLECTOMY     TUBAL LIGATION     uterine ablastion     and polyps removed   Uterine polyps      Review of systems negative except as noted in HPI / PMHx or noted below:  Review of Systems  Constitutional: Negative.   HENT: Negative.    Eyes: Negative.   Respiratory: Negative.    Cardiovascular: Negative.   Gastrointestinal: Negative.   Genitourinary: Negative.   Musculoskeletal: Negative.   Skin: Negative.   Neurological: Negative.   Endo/Heme/Allergies: Negative.   Psychiatric/Behavioral: Negative.       Objective:   Vitals:   10/14/23 1534  BP: 118/70  Pulse: 86  Resp: 14  Temp: 98.1 F (36.7 C)  SpO2: 97%   Height: 5\' 1"  (154.9 cm)  Weight: 152 lb 8 oz (69.2 kg)   Physical Exam Constitutional:      Appearance: She is not diaphoretic.  HENT:     Head: Normocephalic.     Right Ear: Tympanic membrane, ear canal and external ear normal.     Left Ear: Tympanic membrane, ear canal and external ear normal.     Nose: Nose normal. No mucosal edema or rhinorrhea.     Mouth/Throat:     Pharynx: Uvula midline. No oropharyngeal exudate.  Eyes:     Conjunctiva/sclera: Conjunctivae normal.  Neck:     Thyroid: No thyromegaly.     Trachea: Trachea normal. No tracheal tenderness or tracheal deviation.  Cardiovascular:     Rate and Rhythm: Normal rate and regular rhythm.     Heart sounds: Normal heart sounds, S1 normal and S2 normal. No murmur heard. Pulmonary:     Effort: No respiratory distress.     Breath sounds: Normal breath sounds. No stridor. No wheezing or rales.   Lymphadenopathy:     Head:     Right side of head: No tonsillar adenopathy.     Left side of head: No tonsillar adenopathy.     Cervical: No cervical adenopathy.  Skin:    Findings: No erythema or rash.     Nails: There is no clubbing.  Neurological:     Mental Status: She is alert.     Diagnostics: Spirometry was performed and demonstrated an FEV1 of 1.60 at 79 % of predicted.  Assessment and  Plan:   1. Not well controlled severe persistent asthma   2. LPRD (laryngopharyngeal reflux disease)    1.  Allergen avoidance measures - dust mite, pollens, foods?   2.  Treat and prevent inflammation:  A. Breztri - 2 inhalations 2 times per day w/spacer (empty lungs) B. Flonase - 1 spray each nostril 1-2 times per day C. Submit for Tezepelumab administration  3.  Treat and prevent LPR:  A. Minimize caffeine consumption B. Nexium 40 mg - 1 tablet 1-2 times per day C. Famotidine 40 mg - 1 tablet in evening D. Replace all throat clearing with drinking/swallowing maneuver  4.  If needed:  A. AIRSUPRA - 2 inhalations or nebulizer every 4-6 hours B. Cetirizine 10 mg - 1 tablet 1 time per day C. Epi-Pen, benadryl, MD/ER evaluation for allergic reaction  5.  Return to clinic in 6 months or earlier if problem  6. Covid = Paxlovid, Influenza = Tamiflu  [place] has required to systemic steroids in the past 6 months to treat her asthma issue and I think she qualifies for a biologic agent and we will see if we can get approval for tezepelumab administration while she remains on anti-inflammatory agents for both her upper and lower airway.  Her reflux appears to be under pretty good control on her current plan and she will remain on a proton pump inhibitor and H2 receptor blocker.  I will see her back in this clinic in 6 months or earlier if there is a problem.  Laurette Schimke, MD Allergy / Immunology [facility]

## 2023-10-15 ENCOUNTER — Telehealth: Payer: Self-pay | Admitting: *Deleted

## 2023-10-15 ENCOUNTER — Encounter: Payer: Self-pay | Admitting: Allergy and Immunology

## 2023-10-15 NOTE — Telephone Encounter (Signed)
-----   Message from ERIC J KOZLOW sent at 10/15/2023  7:03 AM EST ----- TEZE

## 2023-10-15 NOTE — Telephone Encounter (Signed)
Called patient to get PBM info and will reach back out once approved for next steps

## 2023-10-21 ENCOUNTER — Encounter: Payer: Self-pay | Admitting: Internal Medicine

## 2023-10-21 ENCOUNTER — Telehealth (INDEPENDENT_AMBULATORY_CARE_PROVIDER_SITE_OTHER): Payer: BC Managed Care – PPO | Admitting: Internal Medicine

## 2023-10-21 VITALS — Wt 152.5 lb

## 2023-10-21 DIAGNOSIS — J45909 Unspecified asthma, uncomplicated: Secondary | ICD-10-CM | POA: Diagnosis not present

## 2023-10-21 DIAGNOSIS — Z79899 Other long term (current) drug therapy: Secondary | ICD-10-CM

## 2023-10-21 NOTE — Progress Notes (Signed)
Virtual Visit via Video Note  I connected with [name] on 10/21/23 at  4:00 PM EST by a video enabled telemedicine application and verified that I am speaking with the correct person using two identifiers. Location patient: home Location provider:work office Persons participating in the virtual visit: patient, provider   Patient aware  of the limitations of evaluation and management by telemedicine and  availability of in person appointments. and agreed to proceed.   HPI: [name] presents for video visit  Allergist specialists advise trial fo tezepelumab  biologic for her asthma  since has had  multiple flares in addition to covid infection this year .   Despite optimized inhaler prevention  and also pos allergy testsing . Would like  my take on advice  also .  ROS: See pertinent positives and negatives per HPI.  Past Medical History:  Diagnosis Date   Allergic reaction caused by a drug 01/25/2013   probably.    Allergy    Anemia    Arthritis    Asthma    GERD (gastroesophageal reflux disease)    Heart murmur    Hypertension    Positive TB test    Shingles    TOBACCO USE 03/22/2009   Qualifier: Diagnosis of  By: Fabian Sharp MD, Neta Mends  Stopped Dec 12 2010      Past Surgical History:  Procedure Laterality Date   ABDOMINAL HYSTERECTOMY     BREAST SURGERY     cyst from left breast   BUNIONECTOMY     both feet   CHOLECYSTECTOMY     CYSTECTOMY     left shoulder   KNEE SURGERY Left    PANENDOSCOPY     TONSILLECTOMY     TUBAL LIGATION     uterine ablastion     and polyps removed   Uterine polyps      Family History  Problem Relation Age of Onset   Hypertension Mother    Alzheimer's disease Mother    Hypertension Father    Prostate cancer Father    HIV Brother    Lupus Sister    Colon cancer Paternal Grandmother    Esophageal cancer Neg Hx    Liver cancer Neg Hx    Pancreatic cancer Neg Hx    Rectal cancer Neg Hx    Stomach cancer Neg Hx      Social History   Tobacco Use   Smoking status: Former    Current packs/day: 0.00    Average packs/day: 0.5 packs/day for 17.0 years (8.5 ttl pk-yrs)    Types: Cigarettes    Start date: 12/12/1993    Quit date: 12/12/2010    Years since quitting: 12.8   Smokeless tobacco: Never  Vaping Use   Vaping status: Never Used  Substance Use Topics   Alcohol use: No   Drug use: No      Current Outpatient Medications:    albuterol (PROVENTIL) (2.5 MG/3ML) 0.083% nebulizer solution, Inhale 3 mLs (2.5 mg total) by nebulization every 6 (six) hours as needed for wheezing or shortness of breath., Disp: 125 mL, Rfl: 12   Albuterol-Budesonide (AIRSUPRA) 90-80 MCG/ACT AERO, Inhale 2 Inhalations into the lungs every 4 (four) hours as needed., Disp: 11 g, Rfl: 1   azelastine (ASTELIN) 0.1 % nasal spray, Place 1 spray into both nostrils 2 (two) times daily as needed. Use in each nostril as directed, Disp: 30 mL, Rfl: 5   BREZTRI AEROSPHERE 160-9-4.8 MCG/ACT AERO, Inhale 2 puffs  into the lungs in the morning and at bedtime., Disp: 32.1 g, Rfl: 1   cetirizine (ZYRTEC) 10 MG tablet, Take 1 tablet (10 mg total) by mouth daily as needed for allergies (Can take an extra dose during flare ups.)., Disp: 180 tablet, Rfl: 1   Cyanocobalamin (VITAMIN B-12 PO), Take by mouth daily., Disp: , Rfl:    EPINEPHrine 0.3 mg/0.3 mL IJ SOAJ injection, Inject 0.3 mg into the muscle as needed for anaphylaxis., Disp: 2 each, Rfl: 1   esomeprazole (NEXIUM) 40 MG capsule, Take 1 capsule (40 mg total) by mouth in the morning and at bedtime., Disp: 180 capsule, Rfl: 1   famotidine (PEPCID) 40 MG tablet, Take 1 tablet (40 mg total) by mouth at bedtime., Disp: 90 tablet, Rfl: 1   fluticasone (FLONASE) 50 MCG/ACT nasal spray, Place 1 spray into both nostrils in the morning and at bedtime., Disp: 48 g, Rfl: 1   hydrochlorothiazide (HYDRODIURIL) 25 MG tablet, TAKE 1 TABLET (25 MG TOTAL) BY MOUTH DAILY., Disp: 90 tablet, Rfl: 0    Probiotic Product (PROBIOTIC DAILY PO), Take by mouth., Disp: , Rfl:    Respiratory Therapy Supplies (NEBULIZER MASK ADULT) MISC, 1 kit by Does not apply route as directed., Disp: 1 each, Rfl: 1   Spacer/Aero-Holding Chambers DEVI, 1 Device by Does not apply route as directed., Disp: 1 each, Rfl: 1   valsartan (DIOVAN) 320 MG tablet, TAKE 1/2 TABLETS (160 MG TOTAL) BY MOUTH DAILY., Disp: 45 tablet, Rfl: 1   VITAMIN D PO, Take by mouth., Disp: , Rfl:   EXAM: BP Readings from Last 3 Encounters:  10/14/23 118/70  08/27/23 132/86  08/20/23 (!) 130/90    VITALS per patient if applicable:  GENERAL: alert, oriented, appears well and in no acute distress  HEENT: atraumatic, conjunttiva clear, no obvious abnormalities on inspection of external nose and ears  NECK: normal movements of the head and neck  LUNGS: on inspection no signs of respiratory distress, breathing rate appears normal, no obvious gross SOB, gasping or wheezing feeling tight when talking durint visit  no cough and nl color   PSYCH/NEURO: pleasant and cooperative, no obvious depression or anxiety, speech and thought processing grossly intact Lab Results  Component Value Date   WBC 4.9 08/18/2023   HGB 12.0 08/18/2023   HCT 36.3 08/18/2023   PLT 275 08/18/2023   GLUCOSE 118 (H) 08/18/2023   CHOL 195 05/19/2018   TRIG 41.0 05/19/2018   HDL 83.80 05/19/2018   LDLCALC 103 (H) 05/19/2018   ALT 11 06/17/2023   AST 17 06/17/2023   NA 139 08/18/2023   K 2.9 (L) 08/18/2023   CL 103 08/18/2023   CREATININE 1.12 (H) 08/18/2023   BUN 13 08/18/2023   CO2 26 08/18/2023   TSH 0.97 05/18/2021   INR 1.0 01/17/2021   MICROALBUR 0.7 05/30/2009    ASSESSMENT AND PLAN:  Discussed the following assessment and plan:    ICD-10-CM   1. Asthma, unspecified asthma severity, unspecified whether complicated, unspecified whether persistent  J45.909    frequent recurrences covid infection this fall and poss allergen tests    2.  Medication management  Z79.899      Disc  about asthma and biologics  and that some people are helped a lot with exacerbations   She has had covid infections in past year and  flares of wheezing and also  documented external allergy factors .  Would opine to dr Sharyn Lull her allergist  specialists  but   do not see  a negative that is not already known ; benefit more than risk seems likely. .  Counseled.   Expectant management and discussion of plan and treatment with opportunity to ask questions and all were answered. The patient agreed with the plan and demonstrated an understanding of the instructions.   Advised to call back or seek an in-person evaluation if worsening  or having  further concerns  in interim. Return for as indicated, when planned.  Berniece Andreas, MD

## 2023-10-21 NOTE — Telephone Encounter (Signed)
Called patient and advised approval, copay card and submit to [employer] for Tezspire. Will reach out to patient once delivery set to make appt to start therapy

## 2023-10-23 ENCOUNTER — Ambulatory Visit: Payer: BC Managed Care – PPO | Admitting: Internal Medicine

## 2023-11-10 ENCOUNTER — Ambulatory Visit: Payer: BC Managed Care – PPO | Admitting: *Deleted

## 2023-11-10 DIAGNOSIS — J4550 Severe persistent asthma, uncomplicated: Secondary | ICD-10-CM

## 2023-11-10 MED ORDER — TEZEPELUMAB-EKKO 210 MG/1.91ML ~~LOC~~ SOSY
210.0000 mg | PREFILLED_SYRINGE | SUBCUTANEOUS | Status: DC
Start: 2023-11-10 — End: 2024-09-29
  Administered 2023-11-10: 210 mg via SUBCUTANEOUS

## 2023-11-10 NOTE — Progress Notes (Signed)
 Immunotherapy   Patient Details  Name: [name] MRN: [id number] Date of Birth: 10-07-1966  11/10/2023  [name] started injections for  Tezspire   Frequency: Every 28 Days Epi-Pen: Not Required  Consent signed and patient instructions given. Patient started Tezspire  today and received 1.63mL in the RUA. Patient waited 15 minutes in office and did not experience any issues.     [name] 11/10/2023, 2:40 PM

## 2023-11-28 DIAGNOSIS — F4320 Adjustment disorder, unspecified: Secondary | ICD-10-CM | POA: Diagnosis not present

## 2023-12-02 DIAGNOSIS — F4320 Adjustment disorder, unspecified: Secondary | ICD-10-CM | POA: Diagnosis not present

## 2023-12-08 ENCOUNTER — Telehealth: Payer: Self-pay

## 2023-12-08 NOTE — Telephone Encounter (Signed)
Tezspire injection in [place] in right arm and since then you have had shoulder pain in the joint and on top of shoulder. She has iced and elevated and tried tylenol and ibuprofen with no relief. She is concerned about her up coming injection. She did not have this before she received the injection .

## 2023-12-09 ENCOUNTER — Other Ambulatory Visit: Payer: Self-pay | Admitting: Family

## 2023-12-09 NOTE — Telephone Encounter (Signed)
Pt informed and scheduled with Dr Marlynn Perking on Friday at 3pm

## 2023-12-11 ENCOUNTER — Telehealth: Payer: Self-pay | Admitting: Internal Medicine

## 2023-12-11 DIAGNOSIS — F4320 Adjustment disorder, unspecified: Secondary | ICD-10-CM | POA: Diagnosis not present

## 2023-12-11 NOTE — Telephone Encounter (Signed)
 Copied from CRM [id number]. Topic: Clinical - Medication Refill >> Dec 11, 2023  2:32 PM Tiffany H wrote: Most Recent Primary Care Visit:  Provider: CHARLETT HOWARD K  Department: [facility]  Visit Type: MYCHART VIDEO VISIT  Date: 10/21/2023  Medication: valsartan  (DIOVAN ) 320 MG tablet  Has the patient contacted their pharmacy? Yes (Agent: If no, request that the patient contact the pharmacy for the refill. If patient does not wish to contact the pharmacy document the reason why and proceed with request.) (Agent: If yes, when and what did the pharmacy advise?)  Is this the correct pharmacy for this prescription? Yes If no, delete pharmacy and type the correct one.  This is the patient's preferred pharmacy:   [id number] [place],  - [address] [place] [address] Phone: [phone] Fax: [phone]   Has the prescription been filled recently? No  Is the patient out of the medication? Yes  Has the patient been seen for an appointment in the last year OR does the patient have an upcoming appointment? Yes  Can we respond through MyChart? Yes  Agent: Please be advised that Rx refills may take up to 3 business days. We ask that you follow-up with your pharmacy.

## 2023-12-12 ENCOUNTER — Ambulatory Visit (INDEPENDENT_AMBULATORY_CARE_PROVIDER_SITE_OTHER): Payer: BC Managed Care – PPO | Admitting: Internal Medicine

## 2023-12-12 ENCOUNTER — Encounter: Payer: Self-pay | Admitting: Internal Medicine

## 2023-12-12 ENCOUNTER — Other Ambulatory Visit: Payer: Self-pay

## 2023-12-12 ENCOUNTER — Ambulatory Visit: Payer: BC Managed Care – PPO

## 2023-12-12 VITALS — BP 126/82 | HR 59 | Temp 98.1°F | Resp 18

## 2023-12-12 DIAGNOSIS — M2559 Pain in other specified joint: Secondary | ICD-10-CM

## 2023-12-12 DIAGNOSIS — J302 Other seasonal allergic rhinitis: Secondary | ICD-10-CM

## 2023-12-12 DIAGNOSIS — K219 Gastro-esophageal reflux disease without esophagitis: Secondary | ICD-10-CM

## 2023-12-12 DIAGNOSIS — J4550 Severe persistent asthma, uncomplicated: Secondary | ICD-10-CM

## 2023-12-12 DIAGNOSIS — J3089 Other allergic rhinitis: Secondary | ICD-10-CM

## 2023-12-12 MED ORDER — VALSARTAN 320 MG PO TABS: 160.0000 mg | ORAL_TABLET | Freq: Every day | ORAL | 1 refills | Status: DC

## 2023-12-12 MED ORDER — PREDNISONE 10 MG PO TABS: ORAL_TABLET | ORAL | 0 refills | Status: DC

## 2023-12-12 NOTE — Progress Notes (Signed)
 FOLLOW UP Date of Service/Encounter:  12/16/23  Subjective:  [name] (DOB: 01/09/1966) is a 58 y.o. female who [facility] on 12[facility]12/2023 in re-evaluation of the following: severe persistent asthma, LPRD, rhinitis,  History obtained from: chart review and patient.  For Review, LV was on 10/14/23  with Dr. Maurilio seen for routine follow-up. See below for summary of history and diagnostics.   Therapeutic plans/changes recommended: She was started on tezspire    Today presents for follow-up. Discussed the use of AI scribe software for clinical note transcription with the patient, who gave verbal consent to proceed.  History of Present Illness   Chelsye R Weisenberger is a 58 year old female who presents with joint pain following a Tezspire  injection. She was referred by Dr. Kozlow for evaluation of joint pain following a Tezspire  injection.  She received her first  Tezspire  injection in her right arm on December 11, 2023. Two days post-injection, she began experiencing pain initially localized to the injection site and then radiating to her shoulder. The pain is constant, exacerbated by lying on the affected side, and significantly interrupts her sleep. No pain in other joints is reported.  She has attempted various treatments including Tylenol , Motrin , ibuprofen , hot and cold compresses, and muscle rubs, but reports no relief from these interventions.  She has a history of arthritis, which has been considered in the evaluation of her symptoms. She reports chronic back pain, but the current pain pattern is distinct and temporally related to the Tezspire  injection.  Her breathing has improved with Tezspire , but the joint pain has been a significant issue. She has not been on other biologics prior to Tezspire  and has not experienced similar joint pain before.       TCON to [facility] 12/08/23: ezspire injection in jan in right arm and since then you have had shoulder pain  in the joint and on top of shoulder. She has iced and elevated and tried tylenol  and ibuprofen  with no relief. She is concerned about her up coming injection. She did not have this before she received the injection .   All medications reviewed by clinical staff and updated in chart. No new pertinent medical or surgical history except as noted in HPI.  ROS: All others negative except as noted per HPI.   Objective:  BP 126/82 (BP Location: Right Arm, Patient Position: Sitting, Cuff Size: Normal)   Pulse (!) 59   Temp 98.1 F (36.7 C) (Temporal)   Resp 18   SpO2 95%  There is no height or weight on file to calculate BMI. Physical Exam: General Appearance:  Alert, cooperative, no distress, appears stated age  Head:  Normocephalic, without obvious abnormality, atraumatic  Eyes:  Conjunctiva clear, EOM's intact  Ears EACs normal bilaterally  Nose: Nares normal,     Throat: Lips, tongue normal; teeth and gums normal,     Neck: Supple, symmetrical  Lungs:   clear to auscultation bilaterally, Respirations unlabored, no coughing  Heart:  regular rate and rhythm and no murmur, Appears well perfused  Extremities: No edema, Right Shoulder: negative Neer test, possible positive Vonzell Mare test, normal ROM,   Skin: Skin color, texture, turgor normal and no rashes or lesions on visualized portions of skin  Neurologic: No gross deficits   Labs:  Lab Orders  No laboratory test(s) ordered today       Assessment/Plan     Right Shoulder Pain: inconclusive shoulder exam  - Recommend  stopping Tezspire  for now - Consider switching to IL-5 agent and information on fasenra and nucala given today ([facility] 200 08/28/23)  - For pain and inflammation:  1) Prednisone  10mg  : Take 2 tablets twice a day for 3 more days, Then take 2 tablets once a day for 1 day., then take 1 tablet once a day for 1 day.  2) Continue Tylenol  as needed   -Avoid NSAID while on prednisone     1.  Allergen avoidance  measures - dust mite, pollens, foods?   2.  Treat and prevent inflammation:  A. Breztri  - 2 inhalations 2 times per day w/spacer (empty lungs) B. Flonase  - 1 spray each nostril 1-2 times per day C. Submit for Tezepelumab  administration  3.  Treat and prevent LPR:  A. Minimize caffeine consumption B. Nexium  40 mg - 1 tablet 1-2 times per day C. Famotidine  40 mg - 1 tablet in evening D. Replace all throat clearing with drinking/swallowing maneuver  4.  If needed:  A. AIRSUPRA  - 2 inhalations or nebulizer every 4-6 hours B. Cetirizine  10 mg - 1 tablet 1 time per day C. Epi-Pen, benadryl , MD/ER evaluation for allergic reaction  5.  Return to clinic in 6 months or earlier if problem  6. Covid = Paxlovid, Influenza = Tamiflu  Other:     Thank you so much for letting me partake in your care today.  Don't hesitate to reach out if you have any additional concerns!  Hargis Springer, MD  [facility] [place], [place]

## 2023-12-12 NOTE — Patient Instructions (Addendum)
 Right Shoulder Pain: inconclusive shoulder exam  - Recommend stopping Tezspire  for now - Consider switching to IL-5 agent and information on fasenra and nucala given today ([facility] 200 08/28/23)  - For pain and inflammation:  1) Prednisone  10mg  : Take 2 tablets twice a day for 3 more days, Then take 2 tablets once a day for 1 day., then take 1 tablet once a day for 1 day.  2) Continue Tylenol  as needed   -Avoid NSAID while on prednisone     1.  Allergen avoidance measures - dust mite, pollens, foods?   2.  Treat and prevent inflammation:  A. Breztri  - 2 inhalations 2 times per day w/spacer (empty lungs) B. Flonase  - 1 spray each nostril 1-2 times per day C. Submit for Tezepelumab  administration  3.  Treat and prevent LPR:  A. Minimize caffeine consumption B. Nexium  40 mg - 1 tablet 1-2 times per day C. Famotidine  40 mg - 1 tablet in evening D. Replace all throat clearing with drinking/swallowing maneuver  4.  If needed:  A. AIRSUPRA  - 2 inhalations or nebulizer every 4-6 hours B. Cetirizine  10 mg - 1 tablet 1 time per day C. Epi-Pen, benadryl , MD/ER evaluation for allergic reaction  5.  Return to clinic in 6 months or earlier if problem  6. Covid = Paxlovid, Influenza = Tamiflu

## 2023-12-12 NOTE — Telephone Encounter (Signed)
 Rx sent

## 2023-12-23 DIAGNOSIS — F4320 Adjustment disorder, unspecified: Secondary | ICD-10-CM | POA: Diagnosis not present

## 2024-01-06 DIAGNOSIS — F4320 Adjustment disorder, unspecified: Secondary | ICD-10-CM | POA: Diagnosis not present

## 2024-01-08 ENCOUNTER — Other Ambulatory Visit: Payer: Self-pay | Admitting: Family

## 2024-01-20 DIAGNOSIS — F4320 Adjustment disorder, unspecified: Secondary | ICD-10-CM | POA: Diagnosis not present

## 2024-01-21 ENCOUNTER — Ambulatory Visit (INDEPENDENT_AMBULATORY_CARE_PROVIDER_SITE_OTHER)

## 2024-01-21 ENCOUNTER — Other Ambulatory Visit: Payer: Self-pay

## 2024-01-21 ENCOUNTER — Ambulatory Visit (INDEPENDENT_AMBULATORY_CARE_PROVIDER_SITE_OTHER): Admitting: Family Medicine

## 2024-01-21 VITALS — BP 136/88 | HR 78 | Ht 61.0 in

## 2024-01-21 DIAGNOSIS — M19041 Primary osteoarthritis, right hand: Secondary | ICD-10-CM | POA: Diagnosis not present

## 2024-01-21 DIAGNOSIS — M79641 Pain in right hand: Secondary | ICD-10-CM

## 2024-01-21 DIAGNOSIS — M1811 Unilateral primary osteoarthritis of first carpometacarpal joint, right hand: Secondary | ICD-10-CM | POA: Diagnosis not present

## 2024-01-21 NOTE — Patient Instructions (Addendum)
 Thank you for coming in today.   Please get an Xray today before you leave   I've referred you to Occupational Therapy.  Let us know if you don't hear from them in one week.   Please use Voltaren gel (Generic Diclofenac Gel) up to 4x daily for pain as needed.  This is available over-the-counter as both the name brand Voltaren gel and the generic diclofenac gel.   Check back in 6 weeks

## 2024-01-21 NOTE — Progress Notes (Unsigned)
 Jordan Payor, PhD, LAT, ATC acting as a scribe for Jordan Graham, MD.  [name] is a 58 y.o. female who presents to [facility] today for R hand pain. Pt was previously seen by Dr. Denyse Amass in 2022 for L shoulder and R thumb pain.  Today, pt c/o R hand pain ongoing since Jan, progressively worsening. Pain was really bad this past weekend. Pt locates pain to the 3rd finger on her R hand, MCP joint. No triggering. 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  Aggravates: picking up a lot of things Treatments tried: Tylenol, IBU, heat, cold,   Dx imaging: 05/18/21 R wrist XR  Pertinent review of systems: No fevers or chills  Relevant historical information: Hypertension.     Exam:  BP 136/88   Pulse 78   Ht 5\' 1"  (1.549 m)   SpO2 98%   BMI 28.81 kg/m  General: Well Developed, well nourished, and in no acute distress.   MSK: Right hand some swelling visible at dorsal third MCP.  Mildly tender to palpation.  Normal hand motion.  Intact strength.  Some pain with resisted finger extension is present.    Lab and Radiology Results  Procedure: Real-time Ultrasound Guided Injection of right hand third MCP Device: Philips Affiniti 50G/GE Logiq Images permanently stored and available for review in PACS Verbal informed consent obtained.  Discussed risks and benefits of procedure. Warned about infection, bleeding, hyperglycemia damage to structures among others. Patient expresses understanding and agreement Time-out conducted.   Noted no overlying erythema, induration, or other signs of local infection.   Skin prepped in a sterile fashion.   Local anesthesia: Topical Ethyl chloride.   With sterile technique and under real time ultrasound guidance: 20 mg of Kenalog and 0.5 mm of lidocaine injected into MCP. Fluid seen entering the joint capsule.   Completed without difficulty   Pain immediately resolved suggesting accurate placement  of the medication.   Advised to call if fevers/chills, erythema, induration, drainage, or persistent bleeding.   Images permanently stored and available for review in the ultrasound unit.  Impression: Technically successful ultrasound guided injection.    X-ray images right hand obtained today personally and independently interpreted. Mild degenerative changes.  Tiny erosion present at ulnar aspect of the proximal end of the proximal phalanx.  No acute fractures are visible. Await formal radiology review     Assessment and Plan: 58 y.o. female with right hand pain at third MCP.  Await formal radiology overread.  Plan for steroid injection and occupational therapy.  Consider rheumatologic workup in the future if needed.   PDMP not reviewed this encounter. Orders Placed This Encounter  Procedures   [place] LIMITED JOINT SPACE STRUCTURES UP RIGHT(NO LINKED CHARGES)    Reason for Exam (SYMPTOM  OR DIAGNOSIS REQUIRED):   right hand pain    Preferred imaging location?:   [facility] Hand Complete Right    Standing Status:   Future    Number of Occurrences:   1    Expiration Date:   02/21/2024    Reason for Exam (SYMPTOM  OR DIAGNOSIS REQUIRED):   right hand pain    Preferred imaging location?:   [place] [facility]    Is patient pregnant?:   No   Ambulatory referral to Occupational Therapy    Referral Priority:   Routine    Referral Type:   Occupational Therapy  Referral Reason:   Specialty Services Required    Requested Specialty:   Occupational Therapy    Number of Visits Requested:   1   No orders of the defined types were placed in this encounter.    Discussed warning signs or symptoms. Please see discharge instructions. Patient expresses understanding.   The above documentation has been reviewed and is accurate and complete [name], M.D.

## 2024-01-23 ENCOUNTER — Encounter: Payer: Self-pay | Admitting: Family Medicine

## 2024-01-23 NOTE — Progress Notes (Signed)
 Right hand x-ray shows severe arthritis at the base of the thumb and medium arthritis elsewhere in the hand multiple locations.

## 2024-01-28 ENCOUNTER — Ambulatory Visit: Attending: Family Medicine | Admitting: Occupational Therapy

## 2024-01-28 DIAGNOSIS — M6281 Muscle weakness (generalized): Secondary | ICD-10-CM | POA: Insufficient documentation

## 2024-01-28 DIAGNOSIS — M79641 Pain in right hand: Secondary | ICD-10-CM | POA: Diagnosis not present

## 2024-01-28 DIAGNOSIS — M25641 Stiffness of right hand, not elsewhere classified: Secondary | ICD-10-CM | POA: Diagnosis not present

## 2024-01-28 NOTE — Therapy (Unsigned)
 OUTPATIENT OCCUPATIONAL THERAPY ORTHO EVALUATION  Patient Name: [name] MRN: [id number] DOB:Mar 18, 1966, 58 y.o., female Today's Date: 01/30/2024  PCP: Dr. Fabian Sharp REFERRING PROVIDER: Dr. Denyse Amass  END OF SESSION:    Past Medical History:  Diagnosis Date   Allergic reaction caused by a drug 01/25/2013   probably.    Allergy    Anemia    Arthritis    Asthma    GERD (gastroesophageal reflux disease)    Heart murmur    Hypertension    Positive TB test    Shingles    TOBACCO USE 03/22/2009   Qualifier: Diagnosis of  By: Fabian Sharp MD, Neta Mends  Stopped Dec 12 2010     Past Surgical History:  Procedure Laterality Date   ABDOMINAL HYSTERECTOMY     BREAST SURGERY     cyst from left breast   BUNIONECTOMY     both feet   CHOLECYSTECTOMY     CYSTECTOMY     left shoulder   KNEE SURGERY Left    PANENDOSCOPY     TONSILLECTOMY     TUBAL LIGATION     uterine ablastion     and polyps removed   Uterine polyps     Patient Active Problem List   Diagnosis Date Noted   Right hip pain 08/13/2019   Greater trochanteric bursitis of both hips 07/01/2019   Headache 07/02/2013   Asthma 09/29/2009   Chest pain of uncertain etiology 09/18/2009   Essential hypertension 05/30/2009   GERD 05/12/2008   HOT FLASHES 05/12/2008   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   Allergic rhinitis 08/07/2007    ONSET DATE: 01/21/24  REFERRING DIAG: W09.811 (ICD-10-CM) - Right hand pain  THERAPY DIAG:  Right hand pain - Plan: Ot plan of care cert/re-cert  Pain in right hand - Plan: Ot plan of care cert/re-cert  Stiffness of right hand, not elsewhere classified - Plan: Ot plan of care cert/re-cert  Muscle weakness (generalized) - Plan: Ot plan of care cert/re-cert  Rationale for Evaluation and Treatment: Rehabilitation  SUBJECTIVE:   SUBJECTIVE STATEMENT: Pt reports significant right hand pain Pt accompanied by: self  PERTINENT HISTORY: Pt locates pain to the 3rd finger on her R hand, MCP joint.     Per Dr. Denyse Amass 01/21/24  X-ray images right hand obtained today personally and independently interpreted. Mild degenerative changes.  Tiny erosion present at ulnar aspect of the proximal end of the proximal phalanx.  No acute fractures are visible.   PRECAUTIONS: None    WEIGHT BEARING RESTRICTIONS: No  PAIN:  Are you having pain? Yes: NPRS scale: 6/10 Pain location: right hand Pain description: aching Aggravating factors: typing Relieving factors: pain meds, hot shower   FALLS: Has patient fallen in last 6 months? No  LIVING ENVIRONMENT: Lives with: lives alone Lives in: House/apartment   PLOF: Independent  PATIENT GOALS: reduce pain in RUE  NEXT MD VISIT: 03/05/24  OBJECTIVE:  Note: Objective measures were completed at Evaluation unless otherwise noted.  HAND DOMINANCE: Right  ADLs: Overall ADLs: difficulty twisting hair, pain with walking dog  mod I with all basic ADLS Pt has repetative work environment, placing swabs in bags and typing  FUNCTIONAL OUTCOME MEASURES: Quick Dash: 35% disability  UPPER EXTREMITY ROM:   grossly WFLs   HAND FUNCTION: Grip strength: Right: 13 lbs; Left: 35 lbs    SENSATION: WFL  EDEMA: mild edema a 3rd MP joint, repetative work environment, placing swabs in bags  COGNITION: Overall cognitive status: Within functional limits  for tasks assessed   OBSERVATIONS: Pleasant female motivated to improve.   TREATMENT DATE: 01/28/24-eval. Paraffin x 8 mins for pain and stiffness to right hand. Pt demonstrates decreased pain end of session, no adverse reactions.                                                                                                                                PATIENT EDUCATION: Education details: role ot OT and potential goals Person educated: Patient Education method: Explanation Education comprehension: verbalized understanding  HOME EXERCISE PROGRAM: not issued yet  GOALS: Goals reviewed  with patient? Yes  SHORT TERM GOALS: Target date: 02/28/24  I with initial HEP Baseline:dependent Goal status: INITIAL  2.  I with AE, activity modification/ adapted strategies to minimize pain and risk for injury Baseline: dependent Goal status: INITIAL    LONG TERM GOALS: Target date: 04/01/24  I with updated HEP Baseline:  Goal status: INITIAL  2.  Pt will improve Quick Dash score to 30% disability or better. Baseline: 35% disability Goal status: INITIAL  3.  Pt will increase R grip strength to 20 lbs or better for improved functional use Baseline:RUE  13 lbs, LUE 35 lbs Goal status: INITIAL  4.  Pt will report pain no greater than 4/10 for ADLs/IADLs and work activities. Baseline:pain 6/10  Goal status: INITIAL    ASSESSMENT:  CLINICAL IMPRESSION: Patient is a 58 y.o. female  who was seen today for occupational therapy evaluation for R hand pain. Pt presetns with the perfromance deficits below. She can benefit from skilled occupational therapy to adddress these deficits in order to maximize pt's safety and I with ADLS/IADLs.  PERFORMANCE DEFICITS: in functional skills including ADLs, IADLs, coordination, dexterity, edema, ROM, strength, pain, flexibility, Fine motor control, Gross motor control, decreased knowledge of precautions, and UE functional use,  and psychosocial skills including coping strategies, environmental adaptation, habits, interpersonal interactions, and routines and behaviors.   IMPAIRMENTS: are limiting patient from ADLs, IADLs, rest and sleep, work, play, leisure, and social participation.   COMORBIDITIES: may have co-morbidities  that affects occupational performance. Patient will benefit from skilled OT to address above impairments and improve overall function.  MODIFICATION OR ASSISTANCE TO COMPLETE EVALUATION: No modification of tasks or assist necessary to complete an evaluation.  OT OCCUPATIONAL PROFILE AND HISTORY: Detailed assessment:  Review of records and additional review of physical, cognitive, psychosocial history related to current functional performance.  CLINICAL DECISION MAKING: LOW - limited treatment options, no task modification necessary  REHAB POTENTIAL: Good  EVALUATION COMPLEXITY: Low      PLAN:  OT FREQUENCY:9 visits  OT DURATION: 9 weeks  PLANNED INTERVENTIONS: 97168 OT Re-evaluation, 97535 self care/ADL training, 16109 therapeutic exercise, 97530 therapeutic activity, 97112 neuromuscular re-education, 97140 manual therapy, 97035 ultrasound, 97018 paraffin, 60454 fluidotherapy, 97010 moist heat, 97010 cryotherapy, 97014 electrical stimulation unattended, 97760 Orthotics management and training, 09811 Splinting (initial encounter), passive range of motion, energy conservation,  coping strategies training, patient/family education, and DME and/or AE instructions  RECOMMENDED OTHER SERVICES: n/a  CONSULTED AND AGREED WITH PLAN OF CARE: Patient  PLAN FOR NEXT SESSION: paraffin, gentle A/ROM HEP, activity modification   Cleotilde Spadaccini, OT 01/30/2024, 9:47 AM

## 2024-02-03 DIAGNOSIS — F4320 Adjustment disorder, unspecified: Secondary | ICD-10-CM | POA: Diagnosis not present

## 2024-02-05 NOTE — Therapy (Signed)
 OUTPATIENT OCCUPATIONAL THERAPY ORTHO EVALUATION  Patient Name: [name] MRN: [id number] DOB:03-25-1966, 58 y.o., female Today's Date: 02/06/2024  PCP: Dr. Fabian Sharp REFERRING PROVIDER: Dr. Denyse Amass  END OF SESSION:  OT End of Session - 02/06/24 0852     Visit Number 2    Number of Visits 9    Date for OT Re-Evaluation 04/01/24    Authorization Type [employer]/ [employer]    OT Start Time 0848    OT Stop Time 0928    OT Time Calculation (min) 40 min    Activity Tolerance Patient tolerated treatment well    Behavior During Therapy [facility] for tasks assessed/performed              Past Medical History:  Diagnosis Date   Allergic reaction caused by a drug 01/25/2013   probably.    Allergy    Anemia    Arthritis    Asthma    GERD (gastroesophageal reflux disease)    Heart murmur    Hypertension    Positive TB test    Shingles    TOBACCO USE 03/22/2009   Qualifier: Diagnosis of  By: Fabian Sharp MD, Neta Mends  Stopped Dec 12 2010     Past Surgical History:  Procedure Laterality Date   ABDOMINAL HYSTERECTOMY     BREAST SURGERY     cyst from left breast   BUNIONECTOMY     both feet   CHOLECYSTECTOMY     CYSTECTOMY     left shoulder   KNEE SURGERY Left    PANENDOSCOPY     TONSILLECTOMY     TUBAL LIGATION     uterine ablastion     and polyps removed   Uterine polyps     Patient Active Problem List   Diagnosis Date Noted   Right hip pain 08/13/2019   Greater trochanteric bursitis of both hips 07/01/2019   Headache 07/02/2013   Asthma 09/29/2009   Chest pain of uncertain etiology 09/18/2009   Essential hypertension 05/30/2009   GERD 05/12/2008   HOT FLASHES 05/12/2008   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   Allergic rhinitis 08/07/2007    ONSET DATE: 01/21/24  REFERRING DIAG: M79.641 (ICD-10-CM) - Right hand pain  THERAPY DIAG:  Right hand pain  Stiffness of right hand, not elsewhere classified  Muscle weakness (generalized)  Rationale for Evaluation and Treatment:  Rehabilitation  SUBJECTIVE:   SUBJECTIVE STATEMENT: Pt reports that her hand feels better with less pain, Paraffin provides some relief   Pt accompanied by: self  PERTINENT HISTORY: Pt locates pain to the 3rd finger on her R hand, MCP joint.    Per Dr. Denyse Amass 01/21/24  X-ray images right hand obtained today personally and independently interpreted. Mild degenerative changes.  Tiny erosion present at ulnar aspect of the proximal end of the proximal phalanx.  No acute fractures are visible.   PRECAUTIONS: None  WEIGHT BEARING RESTRICTIONS: No  PAIN:  PAIN:  Are you having pain? Yes: NPRS scale: 5/10 Pain location: R 3rd digit MCP joint  Pain description: sharp Aggravating factors: use  Relieving factors: rest/heat   FALLS: Has patient fallen in last 6 months? No  LIVING ENVIRONMENT: Lives with: lives alone Lives in: House/apartment   PLOF: Independent  PATIENT GOALS: reduce pain in RUE  NEXT MD VISIT: 03/05/24  OBJECTIVE:  Note: Objective measures were completed at Evaluation unless otherwise noted.  HAND DOMINANCE: Right  ADLs: Overall ADLs: difficulty twisting hair, pain with walking dog  mod I with all  basic ADLS Pt has repetative work environment, placing swabs in bags and typing  FUNCTIONAL OUTCOME MEASURES: Quick Dash: 35% disability  UPPER EXTREMITY ROM:   grossly WFLs   HAND FUNCTION: Grip strength: Right: 13 lbs; Left: 35 lbs   SENSATION: WFL  EDEMA: mild edema a 3rd MP joint, repetative work environment, placing swabs in bags  COGNITION: Overall cognitive status: Within functional limits for tasks assessed   OBSERVATIONS: Pleasant female motivated to improve.   TREATMENT DATE:   02/06/24:    Paraffin x80min to R hand for pain/stiffness with no adverse reactions.    Pt instructed in initial HEP.  Begin discussing activity modifications including:  trying to use L hand as able or just use 2nd digit/thumb as able for work tasks and dog  walking.  (Pt reports that she tried L hand with walking dog but it's hard because she has to guide a lot since he is blind and a big dog.  Pt reports that she wears a compression glove while typing).  Recommended pt to lift/hold items with flat hand when possible.   01/28/24-  eval. Paraffin x 8 mins for pain and stiffness to right hand. Pt demonstrates decreased pain end of session, no adverse reactions.                                                                                                                             PATIENT EDUCATION: Education details: Initial HEP--see pt instructions.  Use of heat/ice for pain at home, beginning activity modifications.  Person educated: Patient Education method: Explanation, Demonstration, Verbal cues, and Handouts Education comprehension: verbalized understanding, returned demonstration, and verbal cues required   HOME EXERCISE PROGRAM: 02/06/24:  AROM HEP   GOALS: Goals reviewed with patient? Yes  SHORT TERM GOALS: Target date: 02/28/24  I with initial HEP Baseline:dependent Goal status: INITIAL  2.  I with AE, activity modification/ adapted strategies to minimize pain and risk for injury Baseline: dependent Goal status: INITIAL    LONG TERM GOALS: Target date: 04/01/24  I with updated HEP Baseline:  Goal status: INITIAL  2.  Pt will improve Quick Dash score to 30% disability or better. Baseline: 35% disability Goal status: INITIAL  3.  Pt will increase R grip strength to 20 lbs or better for improved functional use Baseline:RUE  13 lbs, LUE 35 lbs Goal status: INITIAL  4.  Pt will report pain no greater than 4/10 for ADLs/IADLs and work activities. Baseline:pain 6/10  Goal status: INITIAL    ASSESSMENT:  CLINICAL IMPRESSION: Pt reports incr pain with gentle movement and decr pain with heat.   Pt verbalized understanding of initial HEP and activity modifications.  PERFORMANCE DEFICITS: in functional skills including  ADLs, IADLs, coordination, dexterity, edema, ROM, strength, pain, flexibility, Fine motor control, Gross motor control, decreased knowledge of precautions, and UE functional use,  and psychosocial skills including coping strategies, environmental adaptation, habits, interpersonal interactions, and routines and behaviors.  IMPAIRMENTS: are limiting patient from ADLs, IADLs, rest and sleep, work, play, leisure, and social participation.   COMORBIDITIES: may have co-morbidities  that affects occupational performance. Patient will benefit from skilled OT to address above impairments and improve overall function.  MODIFICATION OR ASSISTANCE TO COMPLETE EVALUATION: No modification of tasks or assist necessary to complete an evaluation.  OT OCCUPATIONAL PROFILE AND HISTORY: Detailed assessment: Review of records and additional review of physical, cognitive, psychosocial history related to current functional performance.  CLINICAL DECISION MAKING: LOW - limited treatment options, no task modification necessary  REHAB POTENTIAL: Good  EVALUATION COMPLEXITY: Low      PLAN:  OT FREQUENCY:9 visits  OT DURATION: 9 weeks  PLANNED INTERVENTIONS: 97168 OT Re-evaluation, 97535 self care/ADL training, 16109 therapeutic exercise, 97530 therapeutic activity, 97112 neuromuscular re-education, 97140 manual therapy, 97035 ultrasound, 97018 paraffin, 60454 fluidotherapy, 97010 moist heat, 97010 cryotherapy, 97014 electrical stimulation unattended, 97760 Orthotics management and training, 09811 Splinting (initial encounter), passive range of motion, energy conservation, coping strategies training, patient/family education, and DME and/or AE instructions  RECOMMENDED OTHER SERVICES: n/a  CONSULTED AND AGREED WITH PLAN OF CARE: Patient  PLAN FOR NEXT SESSION:  paraffin, review A/ROM HEP, activity modification   Shannelle Alguire, OTR/L  02/06/2024, 8:52 AM

## 2024-02-06 ENCOUNTER — Ambulatory Visit: Attending: Family Medicine | Admitting: Occupational Therapy

## 2024-02-06 ENCOUNTER — Encounter: Payer: Self-pay | Admitting: Occupational Therapy

## 2024-02-06 DIAGNOSIS — M79641 Pain in right hand: Secondary | ICD-10-CM | POA: Diagnosis not present

## 2024-02-06 DIAGNOSIS — M25641 Stiffness of right hand, not elsewhere classified: Secondary | ICD-10-CM | POA: Diagnosis not present

## 2024-02-06 DIAGNOSIS — M6281 Muscle weakness (generalized): Secondary | ICD-10-CM | POA: Diagnosis not present

## 2024-02-06 NOTE — Patient Instructions (Addendum)
   MP Flexion (Active)    With back of hand on table, bend large knuckles as far as they will go, keeping small joints straight. Repeat 10 times. Do 2-3 sessions per day. Activity: Reach into a narrow container.*   MP Extension (Active)    With palm on table, straighten fingers completely at large knuckles, and lift fingers off table.  Repeat 10 times. Do 2-3 sessions per day. Activity: Tap fingers one at a time on table.*    Flexor Tendon Gliding (Active Hook Fist)   With fingers and knuckles straight, bend middle and tip joints. Do not bend large knuckles. Repeat 10 times. Do 2-3 sessions per day.   Flexor Tendon Gliding (Active Full Fist)   Straighten all fingers, then make a fist, bending all joints. Repeat 10 times. Do 2-3 sessions per day.   Flexor Tendon Gliding (Active Straight Fist)   Start with fingers straight. Bend knuckles and middle joints. Keep fingertip joints straight to touch base of palm. Repeat 10 times. Do 2-3 sessions per day.    Abduction / Adduction (Active)    With hand flat on table, spread all fingers apart, then bring them together as close as possible. Repeat 10 times. Do  2-3 sessions per day.  Copyright  VHI. All rights reserved.

## 2024-02-10 ENCOUNTER — Ambulatory Visit: Admitting: Occupational Therapy

## 2024-02-10 DIAGNOSIS — M25641 Stiffness of right hand, not elsewhere classified: Secondary | ICD-10-CM | POA: Diagnosis not present

## 2024-02-10 DIAGNOSIS — M6281 Muscle weakness (generalized): Secondary | ICD-10-CM

## 2024-02-10 DIAGNOSIS — M79641 Pain in right hand: Secondary | ICD-10-CM

## 2024-02-10 NOTE — Therapy (Signed)
 OUTPATIENT OCCUPATIONAL THERAPY ORTHO EVALUATION  Patient Name: [name] MRN: [id number] DOB:May 20, 1966, 58 y.o., female Today's Date: 02/11/2024  PCP: Dr. Fabian Sharp REFERRING PROVIDER: Dr. Denyse Amass  END OF SESSION:  OT End of Session - 02/10/24 1516     Visit Number 3    Number of Visits 9    Date for OT Re-Evaluation 04/01/24    Authorization Type [employer]/ [employer]    OT Start Time 1448    OT Stop Time 1528    OT Time Calculation (min) 40 min               Past Medical History:  Diagnosis Date   Allergic reaction caused by a drug 01/25/2013   probably.    Allergy    Anemia    Arthritis    Asthma    GERD (gastroesophageal reflux disease)    Heart murmur    Hypertension    Positive TB test    Shingles    TOBACCO USE 03/22/2009   Qualifier: Diagnosis of  By: Fabian Sharp MD, Neta Mends  Stopped Dec 12 2010     Past Surgical History:  Procedure Laterality Date   ABDOMINAL HYSTERECTOMY     BREAST SURGERY     cyst from left breast   BUNIONECTOMY     both feet   CHOLECYSTECTOMY     CYSTECTOMY     left shoulder   KNEE SURGERY Left    PANENDOSCOPY     TONSILLECTOMY     TUBAL LIGATION     uterine ablastion     and polyps removed   Uterine polyps     Patient Active Problem List   Diagnosis Date Noted   Right hip pain 08/13/2019   Greater trochanteric bursitis of both hips 07/01/2019   Headache 07/02/2013   Asthma 09/29/2009   Chest pain of uncertain etiology 09/18/2009   Essential hypertension 05/30/2009   GERD 05/12/2008   HOT FLASHES 05/12/2008   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   Allergic rhinitis 08/07/2007    ONSET DATE: 01/21/24  REFERRING DIAG: M79.641 (ICD-10-CM) - Right hand pain  THERAPY DIAG:  Right hand pain  Stiffness of right hand, not elsewhere classified  Pain in right hand  Muscle weakness (generalized)  Rationale for Evaluation and Treatment: Rehabilitation  SUBJECTIVE:   SUBJECTIVE STATEMENT: Pt reports that her hand is a little  better Pt accompanied by: self  PERTINENT HISTORY: Pt locates pain to the 3rd finger on her R hand, MCP joint.    Per Dr. Denyse Amass 01/21/24  X-ray images right hand obtained today personally and independently interpreted. Mild degenerative changes.  Tiny erosion present at ulnar aspect of the proximal end of the proximal phalanx.  No acute fractures are visible.   PRECAUTIONS: None  WEIGHT BEARING RESTRICTIONS: No  PAIN:  PAIN:  Are you having pain? Yes: NPRS scale: 5/10 Pain location: R 3rd digit MCP joint  Pain description: sharp Aggravating factors: use  Relieving factors: rest/heat   FALLS: Has patient fallen in last 6 months? No  LIVING ENVIRONMENT: Lives with: lives alone Lives in: House/apartment   PLOF: Independent  PATIENT GOALS: reduce pain in RUE  NEXT MD VISIT: 03/05/24  OBJECTIVE:  Note: Objective measures were completed at Evaluation unless otherwise noted.  HAND DOMINANCE: Right  ADLs: Overall ADLs: difficulty twisting hair, pain with walking dog  mod I with all basic ADLS Pt has repetative work environment, placing swabs in bags and typing  FUNCTIONAL OUTCOME MEASURES: Quick Dash: 35%  disability  UPPER EXTREMITY ROM:   grossly WFLs   HAND FUNCTION: Grip strength: Right: 13 lbs; Left: 35 lbs   SENSATION: WFL  EDEMA: mild edema a 3rd MP joint, repetative work environment, placing swabs in bags  COGNITION: Overall cognitive status: Within functional limits for tasks assessed   OBSERVATIONS: Pleasant female motivated to improve.   TREATMENT DATE: 02/10/24 -Paraffin x49min to R hand for pain/stiffness with no adverse reactions.  Pt performed intial HEP issued last visit 10 reps each, min v.c Ice pack applied to R hand x 7 mins while therapist instructed pt in joint protection, and activity modification. see handouts. No adverse reactions to ice. Decreased pain end of session.  02/06/24:    Paraffin x34min to R hand for pain/stiffness with no  adverse reactions.    Pt instructed in initial HEP.  Begin discussing activity modifications including:  trying to use L hand as able or just use 2nd digit/thumb as able for work tasks and dog walking.  (Pt reports that she tried L hand with walking dog but it's hard because she has to guide a lot since he is blind and a big dog.  Pt reports that she wears a compression glove while typing).  Recommended pt to lift/hold items with flat hand when possible.   01/28/24-  eval. Paraffin x 8 mins for pain and stiffness to right hand. Pt demonstrates decreased pain end of session, no adverse reactions.                                                                                                                             PATIENT EDUCATION: Education details: Initial HEP reveiw, activity modification Person educated: Patient Education method: Explanation, Demonstration, Verbal cues, and Handouts Education comprehension: verbalized understanding, returned demonstration, and verbal cues required   HOME EXERCISE PROGRAM: 02/06/24:  AROM HEP   GOALS: Goals reviewed with patient? Yes  SHORT TERM GOALS: Target date: 02/28/24  I with initial HEP Baseline:dependent Goal status: met  2.  I with AE, activity modification/ adapted strategies to minimize pain and risk for injury Baseline: dependent Goal status: INITIAL    LONG TERM GOALS: Target date: 04/01/24  I with updated HEP Baseline:  Goal status: INITIAL  2.  Pt will improve Quick Dash score to 30% disability or better. Baseline: 35% disability Goal status: INITIAL  3.  Pt will increase R grip strength to 20 lbs or better for improved functional use Baseline:RUE  13 lbs, LUE 35 lbs Goal status: INITIAL  4.  Pt will report pain no greater than 4/10 for ADLs/IADLs and work activities. Baseline:pain 6/10  Goal status: INITIAL    ASSESSMENT:  CLINICAL IMPRESSION: Pt reports continued incr pain with gentle movement . Pt reponds  well to ice and heat.  PERFORMANCE DEFICITS: in functional skills including ADLs, IADLs, coordination, dexterity, edema, ROM, strength, pain, flexibility, Fine motor control, Gross motor control, decreased knowledge of precautions, and UE functional use,  and psychosocial skills including coping strategies, environmental adaptation, habits, interpersonal interactions, and routines and behaviors.   IMPAIRMENTS: are limiting patient from ADLs, IADLs, rest and sleep, work, play, leisure, and social participation.   COMORBIDITIES: may have co-morbidities  that affects occupational performance. Patient will benefit from skilled OT to address above impairments and improve overall function.  MODIFICATION OR ASSISTANCE TO COMPLETE EVALUATION: No modification of tasks or assist necessary to complete an evaluation.  OT OCCUPATIONAL PROFILE AND HISTORY: Detailed assessment: Review of records and additional review of physical, cognitive, psychosocial history related to current functional performance.  CLINICAL DECISION MAKING: LOW - limited treatment options, no task modification necessary  REHAB POTENTIAL: Good  EVALUATION COMPLEXITY: Low      PLAN:  OT FREQUENCY:9 visits  OT DURATION: 9 weeks  PLANNED INTERVENTIONS: 97168 OT Re-evaluation, 97535 self care/ADL training, 16109 therapeutic exercise, 97530 therapeutic activity, 97112 neuromuscular re-education, 97140 manual therapy, 97035 ultrasound, 97018 paraffin, 60454 fluidotherapy, 97010 moist heat, 97010 cryotherapy, 97014 electrical stimulation unattended, 97760 Orthotics management and training, 09811 Splinting (initial encounter), passive range of motion, energy conservation, coping strategies training, patient/family education, and DME and/or AE instructions  RECOMMENDED OTHER SERVICES: n/a  CONSULTED AND AGREED WITH PLAN OF CARE: Patient  PLAN FOR NEXT SESSION:   [place] initally, then gentle exercise   Ryder Man, OTR/L  02/11/2024,  8:39 AM

## 2024-02-10 NOTE — Patient Instructions (Addendum)
 Joint Protection (Carrying)    Avoid carrying items with weight on fingers. Solution: Use a shoulder bag or a back pack.  Copyright  VHI. All rights reserved.  Joint Protection (Grip)    Avoid: grasping thin utensils for prolonged periods. Solution: Hold thick-handled tools in dagger fashion when-ever possible for performing tasks such as stirring or scrub-bing. Relax fingers every 10 minutes during activity.  Copyright  VHI. All rights reserved.  Joint Protection (Pinch)    Avoid tight pinch, such as when holding a pen. Solution: Use a thick pen with a felt tip to reduce pressure on fingers.  Copyright  VHI. All rights reserved.   Joint Protection (Ulnar Deviation)    Avoid positions that cause fingers to lean sideways toward little finger. Solution: Use devices like jar-openers to assist in activities.  Copyright  VHI. All rights reserved. Joint Protection (Use Large Joints)    Avoid placing pressure on fingertips. Solution: Transfer work to other parts of body which are not affected or which have greater strength. Using body weight to push heavy doors open is an example.  Copyright  VHI. All rights reserved.   Joint Protection (Wringing)    Avoid wringing towels by twisting. Solution: Loop towel around sink faucet as if braiding and pull gently, or let drip-dry.  Copyright  VHI. All rights reserved.

## 2024-02-11 ENCOUNTER — Encounter: Payer: Self-pay | Admitting: Occupational Therapy

## 2024-02-17 ENCOUNTER — Ambulatory Visit: Admitting: Occupational Therapy

## 2024-02-19 ENCOUNTER — Ambulatory Visit: Admitting: Occupational Therapy

## 2024-02-19 DIAGNOSIS — M79641 Pain in right hand: Secondary | ICD-10-CM | POA: Diagnosis not present

## 2024-02-19 DIAGNOSIS — M25641 Stiffness of right hand, not elsewhere classified: Secondary | ICD-10-CM

## 2024-02-19 DIAGNOSIS — M6281 Muscle weakness (generalized): Secondary | ICD-10-CM

## 2024-02-19 DIAGNOSIS — F4320 Adjustment disorder, unspecified: Secondary | ICD-10-CM | POA: Diagnosis not present

## 2024-02-19 NOTE — Therapy (Signed)
 OUTPATIENT OCCUPATIONAL THERAPY ORTHO EVALUATION  Patient Name: [name] MRN: [id number] DOB:[birth year], 58 y.o., female Today's Date: 02/19/2024  PCP: Dr. Ethel Henry REFERRING PROVIDER: Dr. Alease Hunter  END OF SESSION:  OT End of Session - 02/19/24 0946     Visit Number 4    Number of Visits 9    Date for OT Re-Evaluation 04/01/24    Authorization Type [employer]/ [employer]    OT Start Time 0853    OT Stop Time 0928    OT Time Calculation (min) 35 min                Past Medical History:  Diagnosis Date   Allergic reaction caused by a drug 01/25/2013   probably.    Allergy    Anemia    Arthritis    Asthma    GERD (gastroesophageal reflux disease)    Heart murmur    Hypertension    Positive TB test    Shingles    TOBACCO USE 03/22/2009   Qualifier: Diagnosis of  By: Ethel Henry MD, Joaquim Muir  Stopped Dec 12 2010     Past Surgical History:  Procedure Laterality Date   ABDOMINAL HYSTERECTOMY     BREAST SURGERY     cyst from left breast   BUNIONECTOMY     both feet   CHOLECYSTECTOMY     CYSTECTOMY     left shoulder   KNEE SURGERY Left    PANENDOSCOPY     TONSILLECTOMY     TUBAL LIGATION     uterine ablastion     and polyps removed   Uterine polyps     Patient Active Problem List   Diagnosis Date Noted   Right hip pain 08/13/2019   Greater trochanteric bursitis of both hips 07/01/2019   Headache 07/02/2013   Asthma 09/29/2009   Chest pain of uncertain etiology 09/18/2009   Essential hypertension 05/30/2009   GERD 05/12/2008   HOT FLASHES 05/12/2008   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   Allergic rhinitis 08/07/2007    ONSET DATE: 01/21/24  REFERRING DIAG: M79.641 (ICD-10-CM) - Right hand pain  THERAPY DIAG:  Right hand pain  Stiffness of right hand, not elsewhere classified  Pain in right hand  Muscle weakness (generalized)  Rationale for Evaluation and Treatment: Rehabilitation  SUBJECTIVE:   SUBJECTIVE STATEMENT: Pt reports pain in right hand today Pt  accompanied by: self  PERTINENT HISTORY: Pt locates pain to the 3rd finger on her R hand, MCP joint.    Per Dr. Alease Hunter 01/21/24  X-ray images right hand obtained today personally and independently interpreted. Mild degenerative changes.  Tiny erosion present at ulnar aspect of the proximal end of the proximal phalanx.  No acute fractures are visible.   PRECAUTIONS: None  WEIGHT BEARING RESTRICTIONS: No  PAIN:  PAIN:  Are you having pain? Yes: NPRS scale: 6/10 on arrival, 3/10 end of session Pain location: R 3rd digit MCP joint  Pain description: sharp Aggravating factors: use  Relieving factors: rest/heat   FALLS: Has patient fallen in last 6 months? No  LIVING ENVIRONMENT: Lives with: lives alone Lives in: House/apartment   PLOF: Independent  PATIENT GOALS: reduce pain in RUE  NEXT MD VISIT: 03/05/24  OBJECTIVE:  Note: Objective measures were completed at Evaluation unless otherwise noted.  HAND DOMINANCE: Right  ADLs: Overall ADLs: difficulty twisting hair, pain with walking dog  mod I with all basic ADLS Pt has repetative work environment, placing swabs in bags and typing  FUNCTIONAL  OUTCOME MEASURES: Quick Dash: 35% disability  UPPER EXTREMITY ROM:   grossly WFLs   HAND FUNCTION: Grip strength: Right: 13 lbs; Left: 35 lbs   SENSATION: WFL  EDEMA: mild edema a 3rd MP joint, repetative work environment, placing swabs in bags  COGNITION: Overall cognitive status: Within functional limits for tasks assessed   OBSERVATIONS: Pleasant female motivated to improve.   TREATMENT DATE: 02/19/24-US 3.3 mhz, 0.8 w/cm2, 20% x 8 minsno adverse reactions Pt performed gentle A/ROM exercises for hook fist, MP flexion, composite flexion, finger thumb abduction, finger extension 10 reps each, min v.c Paraffin x 10 mins to RUE while education/ discussion regarding activity modification, importance of rest breaks at work  and pain management such as microwave hot packs and  home paraffin units. Pt was shown several options online. No adverse reactions to paraffin.  02/10/24 -Paraffin x60min to R hand for pain/stiffness with no adverse reactions.  Pt performed intial HEP issued last visit 10 reps each, min v.c Ice pack applied to R hand x 7 mins while therapist instructed pt in joint protection, and activity modification. see handouts. No adverse reactions to ice. Decreased pain end of session.  02/06/24:    Paraffin x59min to R hand for pain/stiffness with no adverse reactions.    Pt instructed in initial HEP.  Begin discussing activity modifications including:  trying to use L hand as able or just use 2nd digit/thumb as able for work tasks and dog walking.  (Pt reports that she tried L hand with walking dog but it's hard because she has to guide a lot since he is blind and a big dog.  Pt reports that she wears a compression glove while typing).  Recommended pt to lift/hold items with flat hand when possible.   01/28/24-  eval. Paraffin x 8 mins for pain and stiffness to right hand. Pt demonstrates decreased pain end of session, no adverse reactions.                                                                                                                             PATIENT EDUCATION: Education details: Initial HEP reveiw, activity modification Person educated: Patient Education method: Explanation, Demonstration, Verbal cues, and Handouts Education comprehension: verbalized understanding, returned demonstration, and verbal cues required   HOME EXERCISE PROGRAM: 02/06/24:  AROM HEP   GOALS: Goals reviewed with patient? Yes  SHORT TERM GOALS: Target date: 02/28/24  I with initial HEP Baseline:dependent Goal status: met  2.  I with AE, activity modification/ adapted strategies to minimize pain and risk for injury Baseline: dependent Goal status: met, 02/19/24    LONG TERM GOALS: Target date: 04/01/24  I with updated HEP Baseline:  Goal status:  INITIAL  2.  Pt will improve Quick Dash score to 30% disability or better. Baseline: 35% disability Goal status: INITIAL  3.  Pt will increase R grip strength to 20 lbs or better for improved functional use Baseline:RUE  13  lbs, LUE 35 lbs Goal status: INITIAL  4.  Pt will report pain no greater than 4/10 for ADLs/IADLs and work activities. Baseline:pain 6/10  Goal status: INITIAL    ASSESSMENT:  CLINICAL IMPRESSION: Pt reports her hand pain is a little better overall. She demonstrates awareness of importance of activity modification and rest breaks with typing. Pt's pain was improved to 3/10 end of session.  PERFORMANCE DEFICITS: in functional skills including ADLs, IADLs, coordination, dexterity, edema, ROM, strength, pain, flexibility, Fine motor control, Gross motor control, decreased knowledge of precautions, and UE functional use,  and psychosocial skills including coping strategies, environmental adaptation, habits, interpersonal interactions, and routines and behaviors.   IMPAIRMENTS: are limiting patient from ADLs, IADLs, rest and sleep, work, play, leisure, and social participation.   COMORBIDITIES: may have co-morbidities  that affects occupational performance. Patient will benefit from skilled OT to address above impairments and improve overall function.  MODIFICATION OR ASSISTANCE TO COMPLETE EVALUATION: No modification of tasks or assist necessary to complete an evaluation.  OT OCCUPATIONAL PROFILE AND HISTORY: Detailed assessment: Review of records and additional review of physical, cognitive, psychosocial history related to current functional performance.  CLINICAL DECISION MAKING: LOW - limited treatment options, no task modification necessary  REHAB POTENTIAL: Good  EVALUATION COMPLEXITY: Low      PLAN:  OT FREQUENCY:9 visits  OT DURATION: 9 weeks  PLANNED INTERVENTIONS: 97168 OT Re-evaluation, 97535 self care/ADL training, 16109 therapeutic exercise,  97530 therapeutic activity, 97112 neuromuscular re-education, 97140 manual therapy, 97035 ultrasound, 97018 paraffin, 60454 fluidotherapy, 97010 moist heat, 97010 cryotherapy, 97014 electrical stimulation unattended, 97760 Orthotics management and training, 09811 Splinting (initial encounter), passive range of motion, energy conservation, coping strategies training, patient/family education, and DME and/or AE instructions  RECOMMENDED OTHER SERVICES: n/a  CONSULTED AND AGREED WITH PLAN OF CARE: Patient  PLAN FOR NEXT SESSION:   [place]  initally, then gentle exercise, progress to strengthening when pain is better.   Kadarrius Yanke, OTR/L  02/19/2024, 9:48 AM

## 2024-02-23 ENCOUNTER — Ambulatory Visit: Admitting: Occupational Therapy

## 2024-02-23 DIAGNOSIS — M6281 Muscle weakness (generalized): Secondary | ICD-10-CM | POA: Diagnosis not present

## 2024-02-23 DIAGNOSIS — M25641 Stiffness of right hand, not elsewhere classified: Secondary | ICD-10-CM

## 2024-02-23 DIAGNOSIS — M79641 Pain in right hand: Secondary | ICD-10-CM

## 2024-02-23 NOTE — Therapy (Signed)
 OUTPATIENT OCCUPATIONAL THERAPY ORTHO EVALUATION  Patient Name: Jordan ABASCAL MRN: [id number] DOB:20-Jun-1966, 58 y.o., female Today's Date: 02/23/2024  PCP: Dr. Ethel Henry REFERRING PROVIDER: Dr. Alease Hunter  END OF SESSION:  OT End of Session - 02/23/24 1559     Visit Number 5    Number of Visits 9    Authorization Type [employer]/ [employer]    OT Start Time 1533    OT Stop Time 1613    OT Time Calculation (min) 40 min                Past Medical History:  Diagnosis Date   Allergic reaction caused by a drug 01/25/2013   probably.    Allergy     Anemia    Arthritis    Asthma    GERD (gastroesophageal reflux disease)    Heart murmur    Hypertension    Positive TB test    Shingles    TOBACCO USE 03/22/2009   Qualifier: Diagnosis of  By: Ethel Henry MD, Joaquim Muir  Stopped Dec 12 2010     Past Surgical History:  Procedure Laterality Date   ABDOMINAL HYSTERECTOMY     BREAST SURGERY     cyst from left breast   BUNIONECTOMY     both feet   CHOLECYSTECTOMY     CYSTECTOMY     left shoulder   KNEE SURGERY Left    PANENDOSCOPY     TONSILLECTOMY     TUBAL LIGATION     uterine ablastion     and polyps removed   Uterine polyps     Patient Active Problem List   Diagnosis Date Noted   Right hip pain 08/13/2019   Greater trochanteric bursitis of both hips 07/01/2019   Headache 07/02/2013   Asthma 09/29/2009   Chest pain of uncertain etiology 09/18/2009   Essential hypertension 05/30/2009   GERD 05/12/2008   HOT FLASHES 05/12/2008   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   Allergic rhinitis 08/07/2007    ONSET DATE: 01/21/24  REFERRING DIAG: M79.641 (ICD-10-CM) - Right hand pain  THERAPY DIAG:  Right hand pain  Stiffness of right hand, not elsewhere classified  Pain in right hand  Muscle weakness (generalized)  Rationale for Evaluation and Treatment: Rehabilitation  SUBJECTIVE:   SUBJECTIVE STATEMENT: Pt reports pain in right hand today Pt accompanied by: self  PERTINENT  HISTORY: Pt locates pain to the 3rd finger on her R hand, MCP joint.    Per Dr. Alease Hunter 01/21/24  X-ray images right hand obtained today personally and independently interpreted. Mild degenerative changes.  Tiny erosion present at ulnar aspect of the proximal end of the proximal phalanx.  No acute fractures are visible.   PRECAUTIONS: None  WEIGHT BEARING RESTRICTIONS: No  PAIN:  PAIN:  Are you having pain? Yes: NPRS scale: 8/10 on arrival, 4/10 end of session Pain location: R MCP joints Pain description: sharp Aggravating factors: use, typing Relieving factors: rest/heat   FALLS: Has patient fallen in last 6 months? No  LIVING ENVIRONMENT: Lives with: lives alone Lives in: House/apartment   PLOF: Independent  PATIENT GOALS: reduce pain in RUE  NEXT MD VISIT: 03/05/24  OBJECTIVE:  Note: Objective measures were completed at Evaluation unless otherwise noted.  HAND DOMINANCE: Right  ADLs: Overall ADLs: difficulty twisting hair, pain with walking dog  mod I with all basic ADLS Pt has repetative work environment, placing swabs in bags and typing  FUNCTIONAL OUTCOME MEASURES: Quick Dash: 35% disability  UPPER EXTREMITY ROM:  grossly WFLs   HAND FUNCTION: Grip strength: Right: 13 lbs; Left: 35 lbs   SENSATION: WFL  EDEMA: mild edema a 3rd MP joint, repetative work environment, placing swabs in bags  COGNITION: Overall cognitive status: Within functional limits for tasks assessed   OBSERVATIONS: Pleasant female motivated to improve.   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 Pt was encouraged to take rest breaks and stretch when she can. US  3.3 mhz, 0.8 w/cm2, 20% x 8 mins no adverse reactions Pt performed gentle A/ROM exercises for hook fist, MP flexion, composite flexion, finger thumb abduction, finger extension 10 reps each, min v.c Paraffin x 10 mins to RUE for pain relief, no adverse reactions,  Therapist attempted to help pt. problem solve how she can twist her hair without aggravating her hand pain. Pt's sister is going to perfrom today. pain reduced to 4/10 end of session  02/19/24-US  3.3 mhz, 0.8 w/cm2, 20% x 8 minsno adverse reactions Pt performed gentle A/ROM exercises for hook fist, MP flexion, composite flexion, finger thumb abduction, finger extension 10 reps each, min v.c Paraffin x 10 mins to RUE while education/ discussion regarding activity modification, importance of rest breaks at work  and pain management such as microwave hot packs and home paraffin units. Pt was shown several options online. No adverse reactions to paraffin.  02/10/24 -Paraffin x57min to R hand for pain/stiffness with no adverse reactions.  Pt performed intial HEP issued last visit 10 reps each, min v.c Ice pack applied to R hand x 7 mins while therapist instructed pt in joint protection, and activity modification. see handouts. No adverse reactions to ice. Decreased pain end of session.  02/06/24:    Paraffin x90min to R hand for pain/stiffness with no adverse reactions.    Pt instructed in initial HEP.  Begin discussing activity modifications including:  trying to use L hand as able or just use 2nd digit/thumb as able for work tasks and dog walking.  (Pt reports that she tried L hand with walking dog but it's hard because she has to guide a lot since he is blind and a big dog.  Pt reports that she wears a compression glove while typing).  Recommended pt to lift/hold items with flat hand when possible.   01/28/24-  eval. Paraffin x 8 mins for pain and stiffness to right hand. Pt demonstrates decreased pain end of session, no adverse reactions.                                                                                                                             PATIENT EDUCATION: Education details: see above  Person educated: Patient Education method: Programmer, multimedia, Demonstration, Verbal cues, and  Handouts Education comprehension: verbalized understanding, returned demonstration, and verbal cues required   HOME EXERCISE PROGRAM: 02/06/24:  AROM HEP   GOALS: Goals reviewed with patient? Yes  SHORT TERM GOALS: Target date: 02/28/24  I with initial HEP Baseline:dependent Goal status: met  2.  I with AE, activity modification/ adapted strategies to minimize pain and risk for injury Baseline: dependent Goal status: met, 02/19/24    LONG TERM GOALS: Target date: 04/01/24  I with updated HEP Baseline:  Goal status: INITIAL  2.  Pt will improve Quick Dash score to 30% disability or better. Baseline: 35% disability Goal status: INITIAL  3.  Pt will increase R grip strength to 20 lbs or better for improved functional use Baseline:RUE  13 lbs, LUE 35 lbs Goal status: INITIAL  4.  Pt will report pain no greater than 4/10 for ADLs/IADLs and work activities. Baseline:pain 6/10  Goal status: INITIAL    ASSESSMENT:  CLINICAL IMPRESSION: Pt reports her hand pain is worse today as she had to do a lot of keyboarding/ data entry. Therapist has been unabl to progress pt to strenghtening due to significant pain.Pt sees MD next week. Pt to discuss additional options with patient. PERFORMANCE DEFICITS: in functional skills including ADLs, IADLs, coordination, dexterity, edema, ROM, strength, pain, flexibility, Fine motor control, Gross motor control, decreased knowledge of precautions, and UE functional use,  and psychosocial skills including coping strategies, environmental adaptation, habits, interpersonal interactions, and routines and behaviors.   IMPAIRMENTS: are limiting patient from ADLs, IADLs, rest and sleep, work, play, leisure, and social participation.   COMORBIDITIES: may have co-morbidities  that affects occupational performance. Patient will benefit from skilled OT to address above impairments and improve overall function.  MODIFICATION OR ASSISTANCE TO COMPLETE  EVALUATION: No modification of tasks or assist necessary to complete an evaluation.  OT OCCUPATIONAL PROFILE AND HISTORY: Detailed assessment: Review of records and additional review of physical, cognitive, psychosocial history related to current functional performance.  CLINICAL DECISION MAKING: LOW - limited treatment options, no task modification necessary  REHAB POTENTIAL: Good  EVALUATION COMPLEXITY: Low      PLAN:  OT FREQUENCY:9 visits  OT DURATION: 9 weeks  PLANNED INTERVENTIONS: 97168 OT Re-evaluation, 97535 self care/ADL training, 09811 therapeutic exercise, 97530 therapeutic activity, 97112 neuromuscular re-education, 97140 manual therapy, 97035 ultrasound, 97018 paraffin, 91478 fluidotherapy, 97010 moist heat, 97010 cryotherapy, 97014 electrical stimulation unattended, 97760 Orthotics management and training, 29562 Splinting (initial encounter), passive range of motion, energy conservation, coping strategies training, patient/family education, and DME and/or AE instructions  RECOMMENDED OTHER SERVICES: n/a  CONSULTED AND AGREED WITH PLAN OF CARE: Patient  PLAN FOR NEXT SESSION:  continue with US  initally, then gentle exercise, progress to strengthening when pain is better, Note to Dr. Alease Hunter next visit   Rondell Pardon, OTR/L  02/23/2024, 4:01 PM

## 2024-03-02 ENCOUNTER — Ambulatory Visit: Admitting: Occupational Therapy

## 2024-03-02 DIAGNOSIS — M25641 Stiffness of right hand, not elsewhere classified: Secondary | ICD-10-CM

## 2024-03-02 DIAGNOSIS — M79641 Pain in right hand: Secondary | ICD-10-CM | POA: Diagnosis not present

## 2024-03-02 DIAGNOSIS — M6281 Muscle weakness (generalized): Secondary | ICD-10-CM | POA: Diagnosis not present

## 2024-03-02 DIAGNOSIS — F4320 Adjustment disorder, unspecified: Secondary | ICD-10-CM | POA: Diagnosis not present

## 2024-03-02 NOTE — Therapy (Unsigned)
 OUTPATIENT OCCUPATIONAL THERAPY ORTHO Treatment  Patient Name: [name] MRN: [id number] DOB:1966/07/23, 58 y.o., female Today's Date: 03/03/2024  PCP: Dr. Ethel Henry REFERRING PROVIDER: Dr. Alease Hunter  END OF SESSION:  OT End of Session - 03/03/24 1049     Visit Number 6    Number of Visits 9    Date for OT Re-Evaluation 04/01/24    Authorization Type [employer]/ [employer]    OT Start Time 1448    OT Stop Time 1529    OT Time Calculation (min) 41 min                 Past Medical History:  Diagnosis Date   Allergic reaction caused by a drug 01/25/2013   probably.    Allergy     Anemia    Arthritis    Asthma    GERD (gastroesophageal reflux disease)    Heart murmur    Hypertension    Positive TB test    Shingles    TOBACCO USE 03/22/2009   Qualifier: Diagnosis of  By: Ethel Henry MD, Joaquim Muir  Stopped Dec 12 2010     Past Surgical History:  Procedure Laterality Date   ABDOMINAL HYSTERECTOMY     BREAST SURGERY     cyst from left breast   BUNIONECTOMY     both feet   CHOLECYSTECTOMY     CYSTECTOMY     left shoulder   KNEE SURGERY Left    PANENDOSCOPY     TONSILLECTOMY     TUBAL LIGATION     uterine ablastion     and polyps removed   Uterine polyps     Patient Active Problem List   Diagnosis Date Noted   Right hip pain 08/13/2019   Greater trochanteric bursitis of both hips 07/01/2019   Headache 07/02/2013   Asthma 09/29/2009   Chest pain of uncertain etiology 09/18/2009   Essential hypertension 05/30/2009   GERD 05/12/2008   HOT FLASHES 05/12/2008   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   Allergic rhinitis 08/07/2007    ONSET DATE: 01/21/24  REFERRING DIAG: M79.641 (ICD-10-CM) - Right hand pain  THERAPY DIAG:  Right hand pain  Stiffness of right hand, not elsewhere classified  Pain in right hand  Muscle weakness (generalized)  Rationale for Evaluation and Treatment: Rehabilitation  SUBJECTIVE:   SUBJECTIVE STATEMENT: Pt reports hand pain is a little better  despite a busy day Pt accompanied by: self  PERTINENT HISTORY: Pt locates pain to the 3rd finger on her R hand, MCP joint.    Per Dr. Alease Hunter 01/21/24  X-ray images right hand obtained today personally and independently interpreted. Mild degenerative changes.  Tiny erosion present at ulnar aspect of the proximal end of the proximal phalanx.  No acute fractures are visible.   PRECAUTIONS: None  WEIGHT BEARING RESTRICTIONS: No  PAIN:  PAIN:  Are you having pain? Yes: NPRS scale:  4/10 on arrival, improved end of session Pain location: R MCP joints Pain description: sharp Aggravating factors: use, typing Relieving factors: rest/heat   FALLS: Has patient fallen in last 6 months? No  LIVING ENVIRONMENT: Lives with: lives alone Lives in: House/apartment   PLOF: Independent  PATIENT GOALS: reduce pain in RUE  NEXT MD VISIT: 03/05/24  OBJECTIVE:  Note: Objective measures were completed at Evaluation unless otherwise noted.  HAND DOMINANCE: Right  ADLs: Overall ADLs: difficulty twisting hair, pain with walking dog  mod I with all basic ADLS Pt has repetative work environment, placing swabs in bags  and typing  FUNCTIONAL OUTCOME MEASURES: Quick Dash: 35% disability  UPPER EXTREMITY ROM:   grossly WFLs   HAND FUNCTION: Grip strength: Right: 13 lbs; Left: 35 lbs   SENSATION: WFL  EDEMA: mild edema a 3rd MP joint, repetative work environment, placing swabs in bags  COGNITION: Overall cognitive status: Within functional limits for tasks assessed   OBSERVATIONS: Pleasant female motivated to improve.   TREATMENT DATE: 03/02/24- Pt reports pain is a little better today.  Therapist discussed with pt that she may benefit from a resting hand splint for wear in the evenings to rest RUE. Pt is in agreement. Therapist fabricated a resting hand splint for RUE with MP's in slight flexion, including wrist and thumb. Pt was educated in splint wear,  care and precautions. Pt was  instructed to initally wear when at rest watching TV, however if no issues she can transition to sleeping in splint at night. US  to right hand , 0.8 w/cm 2, 20% x 8 mins, no adverse reactions. Pt reports hand feels a little  better at end of session. Pt sees MD on Friday and she will call to schedule additional visits after seeing him if he feels it will be beneficial.  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 Pt was encouraged to take rest breaks and stretch when she can. US  3.3 mhz, 0.8 w/cm2, 20% x 8 mins no adverse reactions Pt performed gentle A/ROM exercises for hook fist, MP flexion, composite flexion, finger thumb abduction, finger extension 10 reps each, min v.c Paraffin x 10 mins to RUE for pain relief, no adverse reactions, Therapist attempted to help pt. problem solve how she can twist her hair without aggravating her hand pain. Pt's sister is going to perfrom today. pain reduced to 4/10 end of session  02/19/24-US  3.3 mhz, 0.8 w/cm2, 20% x 8 minsno adverse reactions Pt performed gentle A/ROM exercises for hook fist, MP flexion, composite flexion, finger thumb abduction, finger extension 10 reps each, min v.c Paraffin x 10 mins to RUE while education/ discussion regarding activity modification, importance of rest breaks at work  and pain management such as microwave hot packs and home paraffin units. Pt was shown several options online. No adverse reactions to paraffin.  02/10/24 -Paraffin x36min to R hand for pain/stiffness with no adverse reactions.  Pt performed intial HEP issued last visit 10 reps each, min v.c Ice pack applied to R hand x 7 mins while therapist instructed pt in joint protection, and activity modification. see handouts. No adverse reactions to ice. Decreased pain end of session.  02/06/24:    Paraffin x42min to R hand for pain/stiffness with no adverse reactions.    Pt instructed in initial HEP.  Begin discussing  activity modifications including:  trying to use L hand as able or just use 2nd digit/thumb as able for work tasks and dog walking.  (Pt reports that she tried L hand with walking dog but it's hard because she has to guide a lot since he is blind and a big dog.  Pt reports that she wears a compression glove while typing).  Recommended pt to lift/hold items with flat hand when possible.   01/28/24-  eval. Paraffin x 8 mins for pain and stiffness to right hand. Pt demonstrates decreased pain end of session, no adverse reactions.  PATIENT EDUCATION: Education details: resting hand splint wear, care and precautions, pt instructed to call to schedule more appts if needed. Person educated: Patient Education method: Explanation, Demonstration, Verbal cues, and Handouts Education comprehension: verbalized understanding, returned demonstration, and verbal cues required   HOME EXERCISE PROGRAM: 02/06/24:  AROM HEP activity modification  GOALS: Goals reviewed with patient? Yes  SHORT TERM GOALS: Target date: 02/28/24  I with initial HEP Baseline:dependent Goal status: met  2.  I with AE, activity modification/ adapted strategies to minimize pain and risk for injury Baseline: dependent Goal status: met, 02/19/24    LONG TERM GOALS: Target date: 04/01/24  I with updated HEP  Goal status: ongoing  2.  Pt will improve Quick Dash score to 30% disability or better. Baseline: 35% disability Goal status: ongoing  3.  Pt will increase R grip strength to 20 lbs or better for improved functional use Baseline:RUE  13 lbs, LUE 35 lbs Goal status:  prior to increasing pain RUE grip 7 lbs  4.  Pt will report pain no greater than 4/10 for ADLs/IADLs and work activities. Baseline:pain 6/10  Goal status: INITIAL    ASSESSMENT:  CLINICAL IMPRESSION: Pt reports her hand pain is  a little better today. Pt demonstrates understanding of resting hand splint wear and care. She reports taking rest breaks and exercising hand at work. We have not been able to progress to strengthening as pt continues to be limited by pain. Patient to discuss pain management options with MD. She reports hand pain started after receiving an allergy  shot. She sees MD on Friday and she will call back to schedule additional visits if needed.   PERFORMANCE DEFICITS: in functional skills including ADLs, IADLs, coordination, dexterity, edema, ROM, strength, pain, flexibility, Fine motor control, Gross motor control, decreased knowledge of precautions, and UE functional use,  and psychosocial skills including coping strategies, environmental adaptation, habits, interpersonal interactions, and routines and behaviors.   IMPAIRMENTS: are limiting patient from ADLs, IADLs, rest and sleep, work, play, leisure, and social participation.   COMORBIDITIES: may have co-morbidities  that affects occupational performance. Patient will benefit from skilled OT to address above impairments and improve overall function.  MODIFICATION OR ASSISTANCE TO COMPLETE EVALUATION: No modification of tasks or assist necessary to complete an evaluation.  OT OCCUPATIONAL PROFILE AND HISTORY: Detailed assessment: Review of records and additional review of physical, cognitive, psychosocial history related to current functional performance.  CLINICAL DECISION MAKING: LOW - limited treatment options, no task modification necessary  REHAB POTENTIAL: Good  EVALUATION COMPLEXITY: Low      PLAN:  OT FREQUENCY:9 visits  OT DURATION: 9 weeks  PLANNED INTERVENTIONS: 97168 OT Re-evaluation, 97535 self care/ADL training, 40981 therapeutic exercise, 97530 therapeutic activity, 97112 neuromuscular re-education, 97140 manual therapy, 97035 ultrasound, 97018 paraffin, 19147 fluidotherapy, 97010 moist heat, 97010 cryotherapy, 97014  electrical stimulation unattended, 97760 Orthotics management and training, 82956 Splinting (initial encounter), passive range of motion, energy conservation, coping strategies training, patient/family education, and DME and/or AE instructions  RECOMMENDED OTHER SERVICES: n/a  CONSULTED AND AGREED WITH PLAN OF CARE: Patient  PLAN FOR NEXT SESSION: add more visits if recommended by MD, splint check   Maleka Contino, OTR/L  03/03/2024, 10:50 AM

## 2024-03-02 NOTE — Patient Instructions (Signed)
 Your Splint This splint should initially be fitted by a healthcare practitioner.  The healthcare practitioner is responsible for providing wearing instructions and precautions to the patient, other healthcare practitioners and care provider involved in the patient's care.  This splint was custom made for you. Please read the following instructions to learn about wearing and caring for your splint.  Precautions Should your splint cause any of the following problems, remove the splint immediately and contact your therapist/physician. Swelling Severe Pain Pressure Areas Stiffness Numbness  Do not wear your splint while operating machinery unless it has been fabricated for that purpose.  When To Wear Your Splint Where your splint according to your therapist/physician instructions. Daytime for 1-2 hours, check skin, if no problems gradually increase wear time at rest and progress to sleeping in splint.  Care and Cleaning of Your Splint Keep your splint away from open flames. Your splint will lose its shape in temperatures over 135 degrees Farenheit, ( in car windows, near radiators, ovens or in hot water).  Never make any adjustments to your splint, if the splint needs adjusting remove it and make an appointment to see your therapist. Your splint, including the cushion liner may be cleaned with soap and lukewarm water.  Do not immerse in hot water over 135 degrees Farenheit. Straps may be washed with soap and water, but do not moisten the self-adhesive portion.

## 2024-03-04 NOTE — Progress Notes (Signed)
   Jordan Muck, PhD, LAT, ATC acting as a scribe for Garlan Juniper, MD.  [name] is a 58 y.o. female who presents to [facility] today for f/u R hand pain. Pt was last seen by Dr. Alease Hunter on 01/21/24 and was given a steroid injection into her R 3rd MCP joint and was referred to OT, completing 6 visits.  Today, pt reports some improvement with OT. Sx tend to flare up again when back to work. Sx are still better over all than initial. Continue OT per Dr. Jola Nash recommendation, feels that additional OT would be beneficial .  Dx imaging: 01/21/24 R hand XR 05/18/21 R wrist XR   Pertinent review of systems: No fevers or chills  Relevant historical information: Hypertension   Exam:  BP (!) 156/96   Pulse 63   Ht 5\' 1"  (1.549 m)   Wt 154 lb (69.9 kg)   SpO2 99%   BMI 29.10 kg/m  General: Well Developed, well nourished, and in no acute distress.   MSK: Right hand some swelling is present.  Mildly tender palpation third MCP.  Normal motion.      Assessment and Plan: 58 y.o. 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  She had a steroid injection about 6 weeks ago which helped.  I think further hand therapy will be continued to be helpful.  She notes she was asked about anti-inflammatories which I think could help.  Will go ahead and prescribe meloxicam .  Her kidney function was last assessed about 6 months ago.  Her GFR was just under 60 at that time and her potassium was a bit low.  Will go ahead and check metabolic panel again today in preparation for meloxicam .  Like to make sure her kidneys have not worsened.   PDMP not reviewed this encounter. Orders Placed This Encounter  Procedures   Comprehensive metabolic panel with GFR    Standing Status:   Future    Number of Occurrences:   1    Expiration Date:   03/05/2025   Ambulatory referral to Occupational Therapy    Referral Priority:   Routine    Referral Type:    Occupational Therapy    Referral Reason:   Specialty Services Required    Requested Specialty:   Occupational Therapy    Number of Visits Requested:   1   Meds ordered this encounter  Medications   meloxicam  (MOBIC ) 15 MG tablet    Sig: Take 1 tablet (15 mg total) by mouth daily as needed.    Dispense:  30 tablet    Refill:  1     Discussed warning signs or symptoms. Please see discharge instructions. Patient expresses understanding.   The above documentation has been reviewed and is accurate and complete Garlan Juniper, M.D.

## 2024-03-05 ENCOUNTER — Ambulatory Visit (INDEPENDENT_AMBULATORY_CARE_PROVIDER_SITE_OTHER): Admitting: Family Medicine

## 2024-03-05 ENCOUNTER — Encounter: Payer: Self-pay | Admitting: Family Medicine

## 2024-03-05 VITALS — BP 156/96 | HR 63 | Ht 61.0 in | Wt 154.0 lb

## 2024-03-05 DIAGNOSIS — M79641 Pain in right hand: Secondary | ICD-10-CM

## 2024-03-05 DIAGNOSIS — I10 Essential (primary) hypertension: Secondary | ICD-10-CM

## 2024-03-05 LAB — COMPREHENSIVE METABOLIC PANEL WITH GFR
ALT: 10 U/L (ref 0–35)
AST: 16 U/L (ref 0–37)
Albumin: 4.5 g/dL (ref 3.5–5.2)
Alkaline Phosphatase: 58 U/L (ref 39–117)
BUN: 12 mg/dL (ref 6–23)
CO2: 31 meq/L (ref 19–32)
Calcium: 9.4 mg/dL (ref 8.4–10.5)
Chloride: 103 meq/L (ref 96–112)
Creatinine, Ser: 0.96 mg/dL (ref 0.40–1.20)
GFR: 65.85 mL/min (ref >60.00)
Glucose, Bld: 88 mg/dL (ref 70–99)
Potassium: 3.3 meq/L — ABNORMAL LOW (ref 3.5–5.1)
Sodium: 139 meq/L (ref 135–145)
Total Bilirubin: 0.4 mg/dL (ref 0.2–1.2)
Total Protein: 7.5 g/dL (ref 6.0–8.3)

## 2024-03-05 MED ORDER — MELOXICAM 15 MG PO TABS: 15.0000 mg | ORAL_TABLET | Freq: Every day | ORAL | 1 refills | Status: DC | PRN

## 2024-03-05 NOTE — Patient Instructions (Addendum)
 Thank you for coming in today.   Please get labs today before you leave   Use meloxicam  daily as needed for pain sparingly.   Continue OT.   I can repeat that injection in mid June or later if needed.

## 2024-03-08 NOTE — Progress Notes (Unsigned)
 Virtual Visit via Video Note  I connected with [name] on 03/09/24 at  8:45 AM EDT by a video enabled telemedicine application and verified that I am speaking with the correct person using two identifiers. Location patient: home/vehicle Location provider:work office Persons participating in the virtual visit: patient, provider   Patient aware  of the limitations of evaluation and management by telemedicine and  availability of in person appointments. and agreed to proceed.   HPI: [name] presents for video visit   for a couple of issues.  Has counselor  and pet and to move   counselor can writ e a letter for  emotional support but said that  her ? Area cannot do whole documentation and she asks to see if PCP can help work to [employer] .   [place]ell in [place] 2 weeks ago r elbow bruise but has ongoing soreness tender upper arm but using   Is in  ot therapy for  hand arthritis  and sees Dr Alease Hunter but hasn't had them  evaluate arm shoulder . Rom ok but still hurts some.   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  Past Medical History:  Diagnosis Date   Allergic reaction caused by a drug 01/25/2013   probably.    Allergy     Anemia    Arthritis    Asthma    GERD (gastroesophageal reflux disease)    Heart murmur    Hypertension    Positive TB test    Shingles    TOBACCO USE 03/22/2009   Qualifier: Diagnosis of  By: Ethel Henry MD, Joaquim Muir  Stopped Dec 12 2010      Past Surgical History:  Procedure Laterality Date   ABDOMINAL HYSTERECTOMY     BREAST SURGERY     cyst from left breast   BUNIONECTOMY     both feet   CHOLECYSTECTOMY     CYSTECTOMY     left shoulder   KNEE SURGERY Left    PANENDOSCOPY     TONSILLECTOMY     TUBAL  LIGATION     uterine ablastion     and polyps removed   Uterine polyps      Family History  Problem Relation Age of Onset   Hypertension Mother    Alzheimer's disease Mother    Hypertension Father    Prostate cancer Father    HIV Brother    Lupus Sister    Colon cancer Paternal Grandmother    Esophageal cancer Neg Hx    Liver cancer Neg Hx    Pancreatic cancer Neg Hx    Rectal cancer Neg Hx    Stomach cancer Neg Hx     Social History   Tobacco Use   Smoking status: Former    Current packs/day: 0.00    Average packs/day: 0.5 packs/day for 17.0 years (8.5 ttl pk-yrs)    Types: Cigarettes    Start date: 12/12/1993    Quit date: 12/12/2010    Years since quitting: 13.2   Smokeless tobacco: Never  Vaping Use   Vaping status: Never Used  Substance Use Topics   Alcohol use: No   Drug use:  No      Current Outpatient Medications:    albuterol  (PROVENTIL ) (2.5 MG/3ML) 0.083% nebulizer solution, Inhale 3 mLs (2.5 mg total) by nebulization every 6 (six) hours as needed for wheezing or shortness of breath., Disp: 125 mL, Rfl: 12   Albuterol -Budesonide  (AIRSUPRA ) 90-80 MCG/ACT AERO, Inhale 2 Inhalations into the lungs every 4 (four) hours as needed., Disp: 11 g, Rfl: 1   azelastine  (ASTELIN ) 0.1 % nasal spray, Place 1 spray into both nostrils 2 (two) times daily as needed. Use in each nostril as directed, Disp: 30 mL, Rfl: 5   BREZTRI  AEROSPHERE 160-9-4.8 MCG/ACT AERO, Inhale 2 puffs into the lungs in the morning and at bedtime., Disp: 32.1 g, Rfl: 1   cetirizine  (ZYRTEC ) 10 MG tablet, Take 1 tablet (10 mg total) by mouth daily as needed for allergies (Can take an extra dose during flare ups.)., Disp: 180 tablet, Rfl: 1   Cyanocobalamin (VITAMIN B-12 PO), Take by mouth daily., Disp: , Rfl:    EPINEPHrine  0.3 mg/0.3 mL IJ SOAJ injection, Inject 0.3 mg into the muscle as needed for anaphylaxis., Disp: 2 each, Rfl: 1   esomeprazole  (NEXIUM ) 40 MG capsule, Take 1 capsule (40 mg total) by  mouth in the morning and at bedtime., Disp: 180 capsule, Rfl: 1   famotidine  (PEPCID ) 40 MG tablet, Take 1 tablet (40 mg total) by mouth at bedtime., Disp: 90 tablet, Rfl: 1   fluticasone  (FLONASE ) 50 MCG/ACT nasal spray, Place 1 spray into both nostrils in the morning and at bedtime., Disp: 48 g, Rfl: 1   hydrochlorothiazide  (HYDRODIURIL ) 25 MG tablet, TAKE 1 TABLET (25 MG TOTAL) BY MOUTH DAILY., Disp: 90 tablet, Rfl: 0   meloxicam  (MOBIC ) 15 MG tablet, Take 1 tablet (15 mg total) by mouth daily as needed., Disp: 30 tablet, Rfl: 1   Probiotic Product (PROBIOTIC DAILY PO), Take by mouth., Disp: , Rfl:    Respiratory Therapy Supplies (NEBULIZER MASK ADULT) MISC, 1 kit by Does not apply route as directed., Disp: 1 each, Rfl: 1   Spacer/Aero-Holding Chambers DEVI, 1 Device by Does not apply route as directed., Disp: 1 each, Rfl: 1   valsartan  (DIOVAN ) 320 MG tablet, Take 0.5 tablets (160 mg total) by mouth daily., Disp: 45 tablet, Rfl: 1   VITAMIN D PO, Take by mouth., Disp: , Rfl:   Current Facility-Administered Medications:    tezepelumab -ekko (TEZSPIRE ) 210 MG/1. syringe 210 mg, 210 mg, Subcutaneous, Q28 days, Kozlow, Rema Care, MD, 210 mg at 11/10/23 1455  EXAM: BP Readings from Last 3 Encounters:  03/05/24 (!) 156/96  01/21/24 136/88  12/12/23 126/82    VITALS per patient if applicable:  GENERAL: alert, oriented, appears well and in no acute distress  HEENT: atraumatic, conjunttiva clear, no obvious abnormalities on inspection of external nose and ears  NECK: normal movements of the head and neck  LUNGS: on inspection no signs of respiratory distress, breathing rate appears normal, no obvious gross SOB, gasping or wheezing  CV: no obvious cyanosis  MS: moves all visible extremities without noticeable abnormality point ti upper arm right but movement looks good   PSYCH/NEURO: pleasant and cooperative, no obvious depression or anxiety, speech and thought processing grossly  intact Lab Results  Component Value Date   WBC 4.9 08/18/2023   HGB 12.0 08/18/2023   HCT 36.3 08/18/2023   PLT 275 08/18/2023   GLUCOSE 88 03/05/2024   CHOL 195 05/19/2018   TRIG 41.0 05/19/2018   HDL 83.80 05/19/2018   LDLCALC  103 (H) 05/19/2018   ALT 10 03/05/2024   AST 16 03/05/2024   NA 139 03/05/2024   K 3.3 (L) 03/05/2024   CL 103 03/05/2024   CREATININE 0.96 03/05/2024   BUN 12 03/05/2024   CO2 31 03/05/2024   TSH 0.97 05/18/2021   INR 1.0 01/17/2021   MICROALBUR 0.7 05/30/2009    ASSESSMENT AND PLAN:  Discussed the following assessment and plan:    ICD-10-CM   1. Essential hypertension  I10 Basic metabolic panel with GFR    Magnesium     2. Medication management  Z79.899 Basic metabolic panel with GFR    Magnesium     3. Low blood potassium  E87.6 Basic metabolic panel with GFR    Magnesium     4. History of recent fall  Z91.81     5. Left arm pain  M79.602     Counseled  that I do not do documents for  emotional support animals esp If I am not the  counselor .  Therapist should handle this and have her discuss with living area .  Fu with allergist about the  poss egg sx  Fu potassium level after optimizing potassium diet  and  future labs   if not repleted will have to add potassium or change meds for ht  Counseled.   Expectant management and discussion of plan and treatment with opportunity to ask questions and all were answered. The patient agreed with the plan and demonstrated an understanding of the instructions.   Advised to call back or seek an in-person evaluation if worsening  or having  further concerns  in interim. Return for lab in 3= weeks after change .  Daphine Eagle, MD

## 2024-03-08 NOTE — Progress Notes (Signed)
 Potassium remains a bit low.  Kidney function is stable to improved a bit from 2 years ago.  Okay to use meloxicam  sparingly.

## 2024-03-09 ENCOUNTER — Encounter: Payer: Self-pay | Admitting: Internal Medicine

## 2024-03-09 ENCOUNTER — Telehealth (INDEPENDENT_AMBULATORY_CARE_PROVIDER_SITE_OTHER): Admitting: Internal Medicine

## 2024-03-09 VITALS — Ht 61.0 in | Wt 154.0 lb

## 2024-03-09 DIAGNOSIS — Z79899 Other long term (current) drug therapy: Secondary | ICD-10-CM | POA: Diagnosis not present

## 2024-03-09 DIAGNOSIS — I10 Essential (primary) hypertension: Secondary | ICD-10-CM

## 2024-03-09 DIAGNOSIS — Z9181 History of falling: Secondary | ICD-10-CM

## 2024-03-09 DIAGNOSIS — E876 Hypokalemia: Secondary | ICD-10-CM

## 2024-03-09 DIAGNOSIS — M79602 Pain in left arm: Secondary | ICD-10-CM

## 2024-03-09 NOTE — Patient Instructions (Incomplete)
 Right Shoulder Pain: inconclusive shoulder exam  - Stopped Tezspire  due to shoulder pain   1.  Allergen avoidance measures - dust mite, pollens, foods?   2.  Treat and prevent inflammation:  A. Breztri  - 2 inhalations 2 times per day w/spacer (empty lungs) B. Flonase  - 1 spray each nostril 1-2 times per day C.  Consider switching to IL-5 agent and information on fasenra and nucala given today (AEC 200 08/28/23)  3.  Treat and prevent LPR:  A. Minimize caffeine consumption B. Nexium  40 mg - 1 tablet 1-2 times per day C. Famotidine  40 mg - 1 tablet in evening D. Replace all throat clearing with drinking/swallowing maneuver  4.  If needed:  A. AIRSUPRA  - 2 inhalations or nebulizer every 4-6 hours B. Cetirizine  10 mg - 1 tablet 1 time per day C. Epi-Pen, benadryl , MD/ER evaluation for allergic reaction  5.  Return to clinic in  months or earlier if problem

## 2024-03-09 NOTE — Patient Instructions (Signed)
 Fu with sports medicine about the fall and upper arm shoulder pain  doubt fracture but  can get shoulder strain from falling on elbow   Increase potassium in diet  foods  and check bmp mag in about 3 weeks or so (order in out system)   If not coming up we may need to add  potassium supplement of change bp meds . Disc poss new egg allergy  with allergist.

## 2024-03-10 ENCOUNTER — Ambulatory Visit (INDEPENDENT_AMBULATORY_CARE_PROVIDER_SITE_OTHER): Admitting: Family

## 2024-03-10 ENCOUNTER — Encounter: Payer: Self-pay | Admitting: Family

## 2024-03-10 ENCOUNTER — Other Ambulatory Visit: Payer: Self-pay

## 2024-03-10 VITALS — BP 142/90 | HR 68 | Temp 98.2°F | Wt 151.8 lb

## 2024-03-10 DIAGNOSIS — K219 Gastro-esophageal reflux disease without esophagitis: Secondary | ICD-10-CM

## 2024-03-10 DIAGNOSIS — J302 Other seasonal allergic rhinitis: Secondary | ICD-10-CM

## 2024-03-10 DIAGNOSIS — J4550 Severe persistent asthma, uncomplicated: Secondary | ICD-10-CM | POA: Diagnosis not present

## 2024-03-10 DIAGNOSIS — T781XXD Other adverse food reactions, not elsewhere classified, subsequent encounter: Secondary | ICD-10-CM | POA: Diagnosis not present

## 2024-03-10 DIAGNOSIS — J3089 Other allergic rhinitis: Secondary | ICD-10-CM | POA: Diagnosis not present

## 2024-03-10 DIAGNOSIS — T781XXA Other adverse food reactions, not elsewhere classified, initial encounter: Secondary | ICD-10-CM

## 2024-03-10 MED ORDER — CETIRIZINE HCL 10 MG PO TABS: 10.0000 mg | ORAL_TABLET | Freq: Every day | ORAL | 1 refills | Status: DC | PRN

## 2024-03-10 MED ORDER — EPINEPHRINE 0.3 MG/0.3ML IJ SOAJ: 0.3000 mg | INTRAMUSCULAR | 1 refills | Status: DC | PRN

## 2024-03-10 NOTE — Progress Notes (Signed)
 [address] [place] [address] Dept: [phone]  FOLLOW UP NOTE  Patient ID: [name], female    DOB: [birth year]  Age: 58 y.o. MRN: [id number] Date of Office Visit: 03/10/2024  Assessment  Chief Complaint: Asthma, LPRD, Seasonal and perennial allergic rhinitis due to pollen, Follow-up, and Food Intolerance (Stove top egg)  HPI [name] is a 58 year old female who presents today for an acute visit of egg intolerance.  She was last seen on December 16, 2023 by Dr. Jolayne Natter for right shoulder pain: Inconclusive shoulder exam.  She denies any new diagnosis or surgery since her last office visit.  She reports 3 separate occasions where she has tried boiled egg, fried egg and scrambled egg and had symptoms.  When she has tried the eggs she will feel like a razor blade is in her stomach and she will feel nauseous.  She also mentions that she just feels bad.  She denies any concomitant cardiorespiratory or cutaneous symptoms.  When she had the symptoms she took Gaviscon and drank ginger ale and this helped a little.  She felt like she wanted to throw up, but she did not want to.  She does have an EpiPen  and has not used it.  She is able to eat egg and baked goods without any problems.  Asthma is reported as controlled: She continues to take Breztri  2 puffs twice a day with spacer and has Airsupra  to use as needed.  She reports that she has used Airsupra  maybe once 2 months ago.  She denies cough, wheeze, tightness in chest, shortness of breath, and nocturnal awakenings due to breathing problems.  Since her last office visit she has not required any systemic steroids or made any trips to the emergency room or urgent care due to breathing problems.  She reports that since stopping Tezspire  her right shoulder pain has gone away.  She does feel like the Tezspire  injection caused her arthritis to flare.    Allergic rhinitis: She reports clear rhinorrhea when she goes outside otherwise  she does not have nasal congestion or postnasal drip.  She has not been treated for any sinus infections since we last saw her.  She does mention at times her ears can be itchy and her eyes will be itchy.  She will use her eyedrops that she has and they help.  She also takes Flonase  and cetirizine  daily.  Laryngopharyngeal reflux disease: She reports that her reflux is  slowly getting better. She has been taking a probiotic along with Nexium  once a day and famotidine  once a day.   Drug Allergies:  Allergies  Allergen Reactions   Purified Water [Water, Sterile] Anaphylaxis   Allegra [Fexofenadine] Hives   Chocolate Other (See Comments)    triggers Asthma   Cimetidine Hives   Citric Acid Other (See Comments)    REACTION: swelling, hives, itching   Coly-Mycin S Other (See Comments)    REACTION: pt reports she passed out   Diovan  Hct [Valsartan -Hydrochlorothiazide ] Swelling    Had facial swelling and throat itching after 2 doses of the generic had done well on the brand medicine for a while.  See ED visit February 2014   Amlodipine  Other (See Comments)    At 5 mg dose; swelling    Review of Systems: Negative except as per HPI   Physical Exam: BP (!) 142/90 (BP Location: Right Arm, Patient Position: Sitting, Cuff Size: Normal)   Pulse 68   Temp 98.2 F (  36.8 C) (Temporal)   Wt 151 lb 12.8 oz (68.9 kg)   SpO2 97%   BMI 28.68 kg/m    Physical Exam Constitutional:      Appearance: Normal appearance.  HENT:     Head: Normocephalic and atraumatic.     Comments: Pharynx normal. Eyes normal. Ears normal. Nose normal.    Right Ear: Tympanic membrane, ear canal and external ear normal.     Left Ear: Tympanic membrane, ear canal and external ear normal.     Nose: Nose normal.     Mouth/Throat:     Mouth: Mucous membranes are moist.     Pharynx: Oropharynx is clear.  Eyes:     Conjunctiva/sclera: Conjunctivae normal.  Cardiovascular:     Rate and Rhythm: Regular rhythm.     Heart  sounds: Normal heart sounds.  Pulmonary:     Effort: Pulmonary effort is normal.     Breath sounds: Normal breath sounds.     Comments: Lungs clear to auscultation Musculoskeletal:     Cervical back: Neck supple.  Skin:    General: Skin is warm.  Neurological:     Mental Status: She is alert and oriented to person, place, and time.  Psychiatric:        Mood and Affect: Mood normal.        Behavior: Behavior normal.        Thought Content: Thought content normal.        Judgment: Judgment normal.     Diagnostics:  FVC 1.90 L  ( 76%), FEV1 1.41 L ( 70%), FEV1/FVC 0.74. Spirometry indicates normal spirometry- reduced FEV1. Poor effort noted.  Assessment and Plan: 1. Adverse food reaction, initial encounter   2. Severe persistent asthma without complication   3. LPRD (laryngopharyngeal reflux disease)   4. Seasonal and perennial allergic rhinitis     Meds ordered this encounter  Medications   EPINEPHrine  0.3 mg/0.3 mL IJ SOAJ injection    Sig: Inject 0.3 mg into the muscle as needed for anaphylaxis.    Dispense:  2 each    Refill:  1   cetirizine  (ZYRTEC ) 10 MG tablet    Sig: Take 1 tablet (10 mg total) by mouth daily as needed for allergies (Can take an extra dose during flare ups.).    Dispense:  180 tablet    Refill:  1    Patient Instructions   Adverse food reaction  -For now continue to avoid egg. Ok to eat egg in baked goods as you do not have any problems while eating this. -Have access to your epinephrine  auto injector device at all times -We will get lab work to egg. We will call you with results once they are back   Right Shoulder Pain: no longer having  right shoulder pain - Stopped Tezspire  due to shoulder pain   1.  Allergen avoidance measures - dust mite, pollens, foods?   2.  Treat and prevent inflammation:  A. Breztri  - 2 inhalations 2 times per day w/spacer (empty lungs) B. Flonase  - 1 spray each nostril 1-2 times per day C.  Consider switching  to IL-5 agent and information on fasenra and nucala given today (AEC 200 08/28/23)  3.  Treat and prevent LPR:  A. Minimize caffeine consumption B. Nexium  40 mg - 1 tablet 1-2 times per day C. Famotidine  40 mg - 1 tablet in evening D. Replace all throat clearing with drinking/swallowing maneuver  4.  If needed:  A. AIRSUPRA  -  2 inhalations or nebulizer every 4-6 hours B. Cetirizine  10 mg - 1 tablet 1 time per day C. Epi-Pen, benadryl , MD/ER evaluation for allergic reaction  5.  Keep already scheduled appointment on 04/06/24 at 3: 30 PM or earlier if problem   Return in about 27 days (around 04/06/2024), or if symptoms worsen or fail to improve.    Thank you for the opportunity to care for this patient.  Please do not hesitate to contact me with questions.  Tinnie Forehand, FNP [facility] 

## 2024-03-11 NOTE — Progress Notes (Signed)
 Your kidney function looks okay but your potassium still remains a bit low.  We can talk about this more when I see you on the 15th.  I will make sure your primary care provider is aware.

## 2024-03-12 LAB — ALLERGEN EGG WHITE F1: Egg White IgE: <0.1 kU/L

## 2024-03-14 NOTE — Progress Notes (Signed)
 Please let [name] know that her lab work to egg came back negative. Please continue to avoid egg. Ok to continue to eat egg in baked goods since you do not have any problems. Have access to you epinephrine  auto injector device.  Recommend skin testing to egg. You will need to be off all antihistamines 3 days prior to the appointment.

## 2024-03-16 ENCOUNTER — Ambulatory Visit: Payer: Self-pay

## 2024-03-16 DIAGNOSIS — F4320 Adjustment disorder, unspecified: Secondary | ICD-10-CM | POA: Diagnosis not present

## 2024-03-18 ENCOUNTER — Ambulatory Visit (INDEPENDENT_AMBULATORY_CARE_PROVIDER_SITE_OTHER)

## 2024-03-18 ENCOUNTER — Ambulatory Visit: Admitting: Family Medicine

## 2024-03-18 ENCOUNTER — Ambulatory Visit: Attending: Family Medicine | Admitting: Occupational Therapy

## 2024-03-18 ENCOUNTER — Encounter: Payer: Self-pay | Admitting: Family Medicine

## 2024-03-18 ENCOUNTER — Other Ambulatory Visit: Payer: Self-pay

## 2024-03-18 VITALS — BP 132/96 | HR 78 | Ht 61.0 in | Wt 162.0 lb

## 2024-03-18 DIAGNOSIS — M6281 Muscle weakness (generalized): Secondary | ICD-10-CM | POA: Insufficient documentation

## 2024-03-18 DIAGNOSIS — M25611 Stiffness of right shoulder, not elsewhere classified: Secondary | ICD-10-CM | POA: Insufficient documentation

## 2024-03-18 DIAGNOSIS — M79641 Pain in right hand: Secondary | ICD-10-CM | POA: Diagnosis not present

## 2024-03-18 DIAGNOSIS — M25641 Stiffness of right hand, not elsewhere classified: Secondary | ICD-10-CM | POA: Insufficient documentation

## 2024-03-18 DIAGNOSIS — M19011 Primary osteoarthritis, right shoulder: Secondary | ICD-10-CM | POA: Diagnosis not present

## 2024-03-18 DIAGNOSIS — M25511 Pain in right shoulder: Secondary | ICD-10-CM

## 2024-03-18 NOTE — Therapy (Signed)
 OUTPATIENT OCCUPATIONAL THERAPY ORTHO Treatment  Patient Name: [name] MRN: [id number] DOB:1966-06-25, 58 y.o., female Today's Date: 03/18/2024  PCP: Dr. Ethel Henry REFERRING PROVIDER: Dr. Alease Hunter  END OF SESSION:        Past Medical History:  Diagnosis Date   Allergic reaction caused by a drug 01/25/2013   probably.    Allergy     Anemia    Arthritis    Asthma    GERD (gastroesophageal reflux disease)    Heart murmur    Hypertension    Positive TB test    Shingles    TOBACCO USE 03/22/2009   Qualifier: Diagnosis of  By: Ethel Henry MD, Joaquim Muir  Stopped Dec 12 2010     Past Surgical History:  Procedure Laterality Date   ABDOMINAL HYSTERECTOMY     BREAST SURGERY     cyst from left breast   BUNIONECTOMY     both feet   CHOLECYSTECTOMY     CYSTECTOMY     left shoulder   KNEE SURGERY Left    PANENDOSCOPY     TONSILLECTOMY     TUBAL LIGATION     uterine ablastion     and polyps removed   Uterine polyps     Patient Active Problem List   Diagnosis Date Noted   Right hip pain 08/13/2019   Greater trochanteric bursitis of both hips 07/01/2019   Headache 07/02/2013   Asthma 09/29/2009   Chest pain of uncertain etiology 09/18/2009   Essential hypertension 05/30/2009   GERD 05/12/2008   HOT FLASHES 05/12/2008   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  Rationale for Evaluation and Treatment: Rehabilitation  SUBJECTIVE:   SUBJECTIVE STATEMENT: Pt reports writing is still provoking of pain  Pt accompanied by: self  PERTINENT HISTORY: Pt locates pain to the 3rd finger on her R hand, MCP joint.    Per Dr. Alease Hunter 01/21/24  X-ray images right hand obtained today personally and independently interpreted. Mild degenerative changes.  Tiny erosion present at ulnar aspect of the proximal end of the proximal phalanx.  No acute fractures  are visible.   PRECAUTIONS: None  WEIGHT BEARING RESTRICTIONS: No  PAIN:  PAIN:  Are you having pain? Yes: NPRS scale:  4/10 on arrival, improved end of session Pain location: R MCP joints Pain description: sharp Aggravating factors: use, typing Relieving factors: rest/heat   FALLS: Has patient fallen in last 6 months? No  LIVING ENVIRONMENT: Lives with: lives alone Lives in: House/apartment   PLOF: Independent  PATIENT GOALS: reduce pain in RUE  NEXT MD VISIT: 03/05/24  OBJECTIVE:  Note: Objective measures were completed at Evaluation unless otherwise noted.  HAND DOMINANCE: Right  ADLs: Overall ADLs: difficulty twisting hair, pain with walking dog  mod I with all basic ADLS Pt has repetative work environment, placing swabs in bags and typing  FUNCTIONAL OUTCOME MEASURES: Quick Dash: 35% disability  UPPER EXTREMITY ROM:   grossly WFLs   HAND FUNCTION: Grip strength: Right: 13 lbs; Left: 35 lbs   SENSATION: WFL  EDEMA: mild edema a 3rd MP joint, repetative work environment, placing swabs in bags  COGNITION: Overall cognitive status: Within functional limits for tasks assessed   OBSERVATIONS: Pleasant female motivated to improve.   TREATMENT DATE: 03/17/24- US  3 mhz, 0.8 w/cm2 x 8 mins, no adverse reactions. Reviwed previously issued HEP 10 reps  each, added gentle prayer stretch for wrist and finger exetnsion x 5 rpes hold 10 secs. Paraffin x 10 mins for pain and stiffness to right hand, while discussing activity modification. No adverse reacitons. Therapist recommends use of food processor/ chopper for cutting food. Discussed splint wear, and therapist recommends pt progresses to wearing splint at night.   03/02/24- Pt reports pain is a little better today.  Therapist discussed with pt that she may benefit from a resting hand splint for wear in the evenings to rest RUE. Pt is in agreement. Therapist fabricated a resting hand splint for RUE with MP's in  slight flexion, including wrist and thumb. Pt was educated in splint wear,  care and precautions. Pt was instructed to initally wear when at rest watching TV, however if no issues she can transition to sleeping in splint at night. US  to right hand , 0.8 w/cm 2, 20% x 8 mins, no adverse reactions. Pt reports hand feels a little  better at end of session. Pt sees MD on Friday and she will call to schedule additional visits after seeing him if he feels it will be beneficial.  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 Pt was encouraged to take rest breaks and stretch when she can. US  3.3 mhz, 0.8 w/cm2, 20% x 8 mins no adverse reactions Pt performed gentle A/ROM exercises for hook fist, MP flexion, composite flexion, finger thumb abduction, finger extension 10 reps each, min v.c Paraffin x 10 mins to RUE for pain relief, no adverse reactions, Therapist attempted to help pt. problem solve how she can twist her hair without aggravating her hand pain. Pt's sister is going to perfrom today. pain reduced to 4/10 end of session  02/19/24-US  3.3 mhz, 0.8 w/cm2, 20% x 8 minsno adverse reactions Pt performed gentle A/ROM exercises for hook fist, MP flexion, composite flexion, finger thumb abduction, finger extension 10 reps each, min v.c Paraffin x 10 mins to RUE while education/ discussion regarding activity modification, importance of rest breaks at work  and pain management such as microwave hot packs and home paraffin units. Pt was shown several options online. No adverse reactions to paraffin.  02/10/24 -Paraffin x80min to R hand for pain/stiffness with no adverse reactions.  Pt performed intial HEP issued last visit 10 reps each, min v.c Ice pack applied to R hand x 7 mins while therapist instructed pt in joint protection, and activity modification. see handouts. No adverse reactions to ice. Decreased pain end of session.  02/06/24:    Paraffin x50min to R hand  for pain/stiffness with no adverse reactions.    Pt instructed in initial HEP.  Begin discussing activity modifications including:  trying to use L hand as able or just use 2nd digit/thumb as able for work tasks and dog walking.  (Pt reports that she tried L hand with walking dog but it's hard because she has to guide a lot since he is blind and a big dog.  Pt reports that she wears a compression glove while typing).  Recommended pt to lift/hold items with flat hand when possible.   01/28/24-  eval. Paraffin x 8 mins for pain and stiffness to right hand. Pt demonstrates decreased pain end of session, no adverse reactions.  PATIENT EDUCATION: Education details: resting hand splint wear, care and precautions, pt instructed to call to schedule more appts if needed. Person educated: Patient Education method: Explanation, Demonstration, Verbal cues, and Handouts Education comprehension: verbalized understanding, returned demonstration, and verbal cues required   HOME EXERCISE PROGRAM: 02/06/24:  AROM HEP activity modification  GOALS: Goals reviewed with patient? Yes  SHORT TERM GOALS: Target date: 02/28/24  I with initial HEP Baseline:dependent Goal status: met  2.  I with AE, activity modification/ adapted strategies to minimize pain and risk for injury Baseline: dependent Goal status: met, 02/19/24    LONG TERM GOALS: Target date: 04/01/24  I with updated HEP  Goal status: ongoing  2.  Pt will improve Quick Dash score to 30% disability or better. Baseline: 35% disability Goal status: ongoing  3.  Pt will increase R grip strength to 20 lbs or better for improved functional use Baseline:RUE  13 lbs, LUE 35 lbs Goal status:  met 23 lbs 03/18/24  4.  Pt will report pain no greater than 4/10 for ADLs/IADLs and work activities. Baseline:pain 6/10  Goal status:  ongoing, overall pain at max 5-6/10 03/18/24    ASSESSMENT:  CLINICAL IMPRESSION: Pt reports her hand pain is a little better today. Pt reports that resting hand splint is fitting well. She plans to progress to wearing at night time. Pt met her grip strength goal now that her pain is better.  PERFORMANCE DEFICITS: in functional skills including ADLs, IADLs, coordination, dexterity, edema, ROM, strength, pain, flexibility, Fine motor control, Gross motor control, decreased knowledge of precautions, and UE functional use,  and psychosocial skills including coping strategies, environmental adaptation, habits, interpersonal interactions, and routines and behaviors.   IMPAIRMENTS: are limiting patient from ADLs, IADLs, rest and sleep, work, play, leisure, and social participation.   COMORBIDITIES: may have co-morbidities  that affects occupational performance. Patient will benefit from skilled OT to address above impairments and improve overall function.  MODIFICATION OR ASSISTANCE TO COMPLETE EVALUATION: No modification of tasks or assist necessary to complete an evaluation.  OT OCCUPATIONAL PROFILE AND HISTORY: Detailed assessment: Review of records and additional review of physical, cognitive, psychosocial history related to current functional performance.  CLINICAL DECISION MAKING: LOW - limited treatment options, no task modification necessary  REHAB POTENTIAL: Good  EVALUATION COMPLEXITY: Low      PLAN:  OT FREQUENCY:9 visits  OT DURATION: 9 weeks  PLANNED INTERVENTIONS: 97168 OT Re-evaluation, 97535 self care/ADL training, 19147 therapeutic exercise, 97530 therapeutic activity, 97112 neuromuscular re-education, 97140 manual therapy, 97035 ultrasound, 97018 paraffin, 82956 fluidotherapy, 97010 moist heat, 97010 cryotherapy, 97014 electrical stimulation unattended, 97760 Orthotics management and training, 21308 Splinting (initial encounter), passive range of motion, energy  conservation, coping strategies training, patient/family education, and DME and/or AE instructions  RECOMMENDED OTHER SERVICES: n/a  CONSULTED AND AGREED WITH PLAN OF CARE: Patient  PLAN FOR NEXT SESSION: consider yellow putty   Jazsmin Couse, OTR/L  03/18/2024, 9:05 AM

## 2024-03-18 NOTE — Progress Notes (Signed)
   I, Miquel Amen, CMA acting as a scribe for Garlan Juniper, MD.  [name] is a 58 y.o. female who presents to [facility] today for arm pain. Pt was previously seen by Dr. Alease Hunter on 03/05/24 for R hand pain  Today, pt c/o R arm pain after falling while cleaning the bathroom, slipped at hit the upper right arm on the tub. Pt locates pain to upper right arm. Swelling present. Denies bruising form injury outside of the elbow. Sx worse at night, sometimes has pain with movement, other times does not. Sx worse with side lying. Denies n/t/w  Radiates: localized Aggravates: as above Treatments tried: heat, tylenol   Pertinent review of systems: No fevers or chills  Relevant historical information: Hypertension   Exam:  BP (!) 132/96   Pulse 78   Ht 5\' 1"  (1.549 m)   Wt 162 lb (73.5 kg)   SpO2 100%   BMI 30.61 kg/m  General: Well Developed, well nourished, and in no acute distress.   MSK: Right shoulder normal-appearing Normal motion pain with abduction. Strength 4/5 abduction. Negative Yergason's and speeds test. Positive Hawkins and Neer's test. Positive empty can test.  Right elbow normal appearing.  Nontender.    Lab and Radiology Results  Diagnostic Limited MSK Ultrasound of: Right shoulder Biceps tendon is intact. Subscapularis tendon is intact. Supraspinatus tendon intact with no retracted tear.  Moderate subacromial bursitis is present. Infraspinatus tendon is intact. Impression: Subacromial bursitis  X-ray images right shoulder obtained today personally and independently interpreted. Right shoulder no severe glenohumeral DJD.  Wide AC joint possible history of distal clavicle excision.  No acute fractures are visible. Await formal radiology review   Assessment and Plan: 58 y.o. female with right shoulder pain due to injury and rotator cuff tendinitis and bursitis.  She may have a small tear but there was not a visible one on  ultrasound.  She is doing pretty well.  Plan for occupational therapy and recheck especially if not improving.   PDMP not reviewed this encounter. Orders Placed This Encounter  Procedures   US  LIMITED JOINT SPACE STRUCTURES UP RIGHT(NO LINKED CHARGES)    Reason for Exam (SYMPTOM  OR DIAGNOSIS REQUIRED):   shoulder pain    Preferred imaging location?:   [facility] Shoulder Right    Standing Status:   Future    Number of Occurrences:   1    Expiration Date:   03/18/2025    Reason for Exam (SYMPTOM  OR DIAGNOSIS REQUIRED):   right shoulder pain    Preferred imaging location?:   [place]    Is patient pregnant?:   No   Ambulatory referral to Occupational Therapy    Referral Priority:   Routine    Referral Type:   Occupational Therapy    Referral Reason:   Specialty Services Required    Requested Specialty:   Occupational Therapy    Number of Visits Requested:   1   No orders of the defined types were placed in this encounter.    Discussed warning signs or symptoms. Please see discharge instructions. Patient expresses understanding.   The above documentation has been reviewed and is accurate and complete Garlan Juniper, M.D.

## 2024-03-18 NOTE — Patient Instructions (Addendum)
Thank you for coming in today.   Please get an Xray today before you leave   I've referred you to Physical Therapy.  Let us know if you don't hear from them in one week.   Check back as needed 

## 2024-03-21 ENCOUNTER — Ambulatory Visit: Payer: Self-pay | Admitting: Family Medicine

## 2024-03-21 NOTE — Progress Notes (Signed)
 Right shoulder x-ray shows mild arthritis in the main shoulder joint.  It looks like you have had shoulder surgery in the past.

## 2024-03-22 ENCOUNTER — Encounter (HOSPITAL_BASED_OUTPATIENT_CLINIC_OR_DEPARTMENT_OTHER): Payer: Self-pay | Admitting: Emergency Medicine

## 2024-03-22 ENCOUNTER — Emergency Department (HOSPITAL_BASED_OUTPATIENT_CLINIC_OR_DEPARTMENT_OTHER)

## 2024-03-22 ENCOUNTER — Other Ambulatory Visit: Payer: Self-pay

## 2024-03-22 ENCOUNTER — Emergency Department (HOSPITAL_BASED_OUTPATIENT_CLINIC_OR_DEPARTMENT_OTHER): Admission: EM | Admit: 2024-03-22 | Discharge: 2024-03-22 | Disposition: A | Discharge status: Home / Self Care

## 2024-03-22 DIAGNOSIS — Z79899 Other long term (current) drug therapy: Secondary | ICD-10-CM | POA: Diagnosis not present

## 2024-03-22 DIAGNOSIS — R944 Abnormal results of kidney function studies: Secondary | ICD-10-CM | POA: Insufficient documentation

## 2024-03-22 DIAGNOSIS — R1013 Epigastric pain: Secondary | ICD-10-CM | POA: Insufficient documentation

## 2024-03-22 DIAGNOSIS — R110 Nausea: Secondary | ICD-10-CM | POA: Diagnosis not present

## 2024-03-22 DIAGNOSIS — I10 Essential (primary) hypertension: Secondary | ICD-10-CM | POA: Diagnosis not present

## 2024-03-22 DIAGNOSIS — R109 Unspecified abdominal pain: Secondary | ICD-10-CM | POA: Diagnosis not present

## 2024-03-22 DIAGNOSIS — N281 Cyst of kidney, acquired: Secondary | ICD-10-CM | POA: Diagnosis not present

## 2024-03-22 LAB — CBC WITH DIFFERENTIAL/PLATELET
Abs Immature Granulocytes: 0.01 10*3/uL (ref 0.00–0.07)
Basophils Absolute: 0.1 10*3/uL (ref 0.0–0.1)
Basophils Relative: 1 %
Eosinophils Absolute: 0.3 10*3/uL (ref 0.0–0.5)
Eosinophils Relative: 6 %
HCT: 36 % (ref 36.0–46.0)
Hemoglobin: 11.8 g/dL — ABNORMAL LOW (ref 12.0–15.0)
Immature Granulocytes: 0 %
Lymphocytes Relative: 41 %
Lymphs Abs: 2 10*3/uL (ref 0.7–4.0)
MCH: 27.1 pg (ref 26.0–34.0)
MCHC: 32.8 g/dL (ref 30.0–36.0)
MCV: 82.6 fL (ref 80.0–100.0)
Monocytes Absolute: 0.4 10*3/uL (ref 0.1–1.0)
Monocytes Relative: 8 %
Neutro Abs: 2.2 10*3/uL (ref 1.7–7.7)
Neutrophils Relative %: 44 %
Platelets: 332 10*3/uL (ref 150–400)
RBC: 4.36 MIL/uL (ref 3.87–5.11)
RDW: 15.2 % (ref 11.5–15.5)
WBC: 4.9 10*3/uL (ref 4.0–10.5)
nRBC: 0 % (ref 0.0–0.2)

## 2024-03-22 LAB — COMPREHENSIVE METABOLIC PANEL WITH GFR
ALT: 11 U/L (ref 0–44)
AST: 19 U/L (ref 15–41)
Albumin: 4.3 g/dL (ref 3.5–5.0)
Alkaline Phosphatase: 71 U/L (ref 38–126)
Anion gap: 12 (ref 5–15)
BUN: 15 mg/dL (ref 6–20)
CO2: 24 mmol/L (ref 22–32)
Calcium: 9.4 mg/dL (ref 8.9–10.3)
Chloride: 103 mmol/L (ref 98–111)
Creatinine, Ser: 1.03 mg/dL — ABNORMAL HIGH (ref 0.44–1.00)
GFR, Estimated: >60 mL/min (ref >60)
Glucose, Bld: 104 mg/dL — ABNORMAL HIGH (ref 70–99)
Potassium: 3.8 mmol/L (ref 3.5–5.1)
Sodium: 139 mmol/L (ref 135–145)
Total Bilirubin: 0.2 mg/dL (ref 0.0–1.2)
Total Protein: 7.4 g/dL (ref 6.5–8.1)

## 2024-03-22 LAB — LIPASE, BLOOD: Lipase: 29 U/L (ref 11–51)

## 2024-03-22 MED ORDER — IOHEXOL 300 MG/ML  SOLN
100.0000 mL | Freq: Once | INTRAMUSCULAR | Status: AC | PRN
Start: 2024-03-22 — End: 2024-03-22
  Administered 2024-03-22: 75 mL via INTRAVENOUS

## 2024-03-22 MED ORDER — ONDANSETRON 4 MG PO TBDP: 4.0000 mg | ORAL_TABLET | Freq: Three times a day (TID) | ORAL | 0 refills | Status: AC | PRN

## 2024-03-22 MED ORDER — FENTANYL CITRATE PF 50 MCG/ML IJ SOSY
50.0000 ug | PREFILLED_SYRINGE | Freq: Once | INTRAMUSCULAR | Status: DC
  Filled 2024-03-22: qty 1

## 2024-03-22 MED ORDER — KETOROLAC TROMETHAMINE 15 MG/ML IJ SOLN
15.0000 mg | Freq: Once | INTRAMUSCULAR | Status: AC
  Administered 2024-03-22: 15 mg via INTRAVENOUS
  Filled 2024-03-22: qty 1

## 2024-03-22 MED ORDER — ONDANSETRON HCL 4 MG/2ML IJ SOLN
4.0000 mg | Freq: Once | INTRAMUSCULAR | Status: AC
  Administered 2024-03-22: 4 mg via INTRAVENOUS
  Filled 2024-03-22: qty 2

## 2024-03-22 NOTE — ED Provider Notes (Signed)
 [place] EMERGENCY DEPARTMENT AT [facility] Provider Note   CSN: [id number] Arrival date & time: 03/22/24  1057     History  Chief Complaint  Patient presents with   Abdominal Pain    [name] is a 58 y.o. female with history of hiatal hernia, hypertension, presents with concern for epigastric abdominal pain.  States the pain was initially intermittent over the past few days, but became constant this morning.  She reports nausea but no vomiting.  Reports normal formed stools, last bowel movement was yesterday evening.  No bloody stools or diarrhea.  No fever or chills at home.  Denies any dysuria, hematuria, or increased frequency.   Abdominal Pain      Home Medications Prior to Admission medications   Medication Sig Start Date End Date Taking? Authorizing Provider  albuterol  (PROVENTIL ) (2.5 MG/3ML) 0.083% nebulizer solution Inhale 3 mLs (2.5 mg total) by nebulization every 6 (six) hours as needed for wheezing or shortness of breath. 05/06/23   Kozlow, Rema Care, MD  Albuterol -Budesonide  (AIRSUPRA ) 90-80 MCG/ACT AERO Inhale 2 Inhalations into the lungs every 4 (four) hours as needed. 10/14/23   Kozlow, Rema Care, MD  azelastine  (ASTELIN ) 0.1 % nasal spray Place 1 spray into both nostrils 2 (two) times daily as needed. Use in each nostril as directed 08/27/23   Kandice Orleans, MD  BREZTRI  AEROSPHERE 160-9-4.8 MCG/ACT AERO Inhale 2 puffs into the lungs in the morning and at bedtime. 10/14/23   Kozlow, Rema Care, MD  cetirizine  (ZYRTEC ) 10 MG tablet Take 1 tablet (10 mg total) by mouth daily as needed for allergies (Can take an extra dose during flare ups.). 03/10/24   Tinnie Forehand, FNP  Cyanocobalamin (VITAMIN B-12 PO) Take by mouth daily.    [provider]  EPINEPHrine  0.3 mg/0.3 mL IJ SOAJ injection Inject 0.3 mg into the muscle as needed for anaphylaxis. 03/10/24   Tinnie Forehand, FNP  esomeprazole  (NEXIUM ) 40 MG capsule Take 1 capsule (40 mg total) by mouth in  the morning and at bedtime. 10/14/23   Kozlow, Rema Care, MD  famotidine  (PEPCID ) 40 MG tablet Take 1 tablet (40 mg total) by mouth at bedtime. 10/14/23   Kozlow, Rema Care, MD  fluticasone  (FLONASE ) 50 MCG/ACT nasal spray Place 1 spray into both nostrils in the morning and at bedtime. 10/14/23   Kozlow, Rema Care, MD  hydrochlorothiazide  (HYDRODIURIL ) 25 MG tablet TAKE 1 TABLET (25 MG TOTAL) BY MOUTH DAILY. 09/05/23   Webb, Padonda B, FNP  meloxicam  (MOBIC ) 15 MG tablet Take 1 tablet (15 mg total) by mouth daily as needed. 03/05/24   Corey, Evan S, MD  Probiotic Product (PROBIOTIC DAILY PO) Take by mouth.    [provider]  Respiratory Therapy Supplies (NEBULIZER MASK ADULT) MISC 1 kit by Does not apply route as directed. 05/06/23   Kozlow, Rema Care, MD  Spacer/Aero-Holding Idelle Majors DEVI 1 Device by Does not apply route as directed. 05/06/23   Kozlow, Rema Care, MD  valsartan  (DIOVAN ) 320 MG tablet Take 0.5 tablets (160 mg total) by mouth daily. 12/12/23   [name], Wanda K, MD  VITAMIN D PO Take by mouth.    [provider]      Allergies    Purified water [water, sterile]; Allegra [fexofenadine]; Chocolate; Cimetidine; Citric acid; Coly-mycin s; Diovan  hct [valsartan -hydrochlorothiazide ]; and Amlodipine     Review of Systems   Review of Systems  Gastrointestinal:  Positive for abdominal pain.    Physical Exam Updated Vital Signs  BP (!) 146/96   Pulse (!) 57   Temp 98 F (36.7 C) (Oral)   Resp 16   Ht 5' (1.524 m)   Wt 68.9 kg   SpO2 100%   BMI 29.69 kg/m  Physical Exam Vitals and nursing note reviewed.  Constitutional:      General: She is not in acute distress.    Appearance: Normal appearance. She is well-developed.     Comments: No active vomiting  HENT:     Head: Normocephalic and atraumatic.  Eyes:     Conjunctiva/sclera: Conjunctivae normal.  Cardiovascular:     Rate and Rhythm: Normal rate and regular rhythm.     Heart sounds: No murmur heard. Pulmonary:     Effort:  Pulmonary effort is normal. No respiratory distress.     Breath sounds: Normal breath sounds.  Abdominal:     Palpations: Abdomen is soft.     Tenderness: There is abdominal tenderness. There is no right CVA tenderness or left CVA tenderness.     Comments: Mild tenderness to palpation in the epigastric region, no rebound or guarding.  No tenderness in the right lower quadrant or left lower quadrant.  Musculoskeletal:        General: No swelling.     Cervical back: Neck supple.  Skin:    General: Skin is warm and dry.     Capillary Refill: Capillary refill takes less than 2 seconds.  Neurological:     General: No focal deficit present.     Mental Status: She is alert.  Psychiatric:        Mood and Affect: Mood normal.        Behavior: Behavior normal.     ED Results / Procedures / Treatments   Labs (all labs ordered are listed, but only abnormal results are displayed) Labs Reviewed  CBC WITH DIFFERENTIAL/PLATELET - Abnormal; Notable for the following components:      Result Value   Hemoglobin 11.8 (*)    All other components within normal limits  COMPREHENSIVE METABOLIC PANEL WITH GFR - Abnormal; Notable for the following components:   Glucose, Bld 104 (*)    Creatinine, Ser 1.03 (*)    All other components within normal limits  LIPASE, BLOOD    EKG None  Radiology CT ABDOMEN PELVIS W CONTRAST Result Date: 03/22/2024 CLINICAL DATA:  Abdominal pain. EXAM: CT ABDOMEN AND PELVIS WITH CONTRAST TECHNIQUE: Multidetector CT imaging of the abdomen and pelvis was performed using the standard protocol following bolus administration of intravenous contrast. RADIATION DOSE REDUCTION: This exam was performed according to the departmental dose-optimization program which includes automated exposure control, adjustment of the mA and/or kV according to patient size and/or use of iterative reconstruction technique. CONTRAST:  75mL OMNIPAQUE  IOHEXOL  300 MG/ML  SOLN COMPARISON:  CT abdomen pelvis  dated 11/04/2021. FINDINGS: Lower chest: The visualized lung bases are clear. No intra-abdominal free air or free fluid. Hepatobiliary: The liver is unremarkable. No biliary dilatation. Cholecystectomy. Pancreas: Unremarkable. No pancreatic ductal dilatation or surrounding inflammatory changes. Spleen: Normal in size without focal abnormality. Adrenals/Urinary Tract: The adrenal glands unremarkable. Small left renal upper pole cyst. There is no hydronephrosis on either side. There is symmetric enhancement and excretion of contrast by both kidneys. The visualized ureters and urinary bladder appear unremarkable. Stomach/Bowel: There is no bowel obstruction or active inflammation. The appendix is normal. Vascular/Lymphatic: The abdominal aorta and IVC are unremarkable. No portal venous gas. There is no adenopathy. Reproductive: Hysterectomy.  No suspicious adnexal  masses. Other: None Musculoskeletal: No acute osseous pathology. IMPRESSION: 1. No acute intra-abdominal or pelvic pathology. 2. No bowel obstruction. Normal appendix. Electronically Signed   By: Angus Bark M.D.   On: 03/22/2024 14:32    Procedures Procedures    Medications Ordered in ED Medications  ondansetron  (ZOFRAN ) injection 4 mg (4 mg Intravenous Given 03/22/24 1200)  ketorolac  (TORADOL ) 15 MG/ML injection 15 mg (15 mg Intravenous Given 03/22/24 1239)  iohexol  (OMNIPAQUE ) 300 MG/ML solution 100 mL (75 mLs Intravenous Contrast Given 03/22/24 1243)    ED Course/ Medical Decision Making/ A&P                                 Medical Decision Making Amount and/or Complexity of Data Reviewed Labs: ordered. Radiology: ordered.  Risk Prescription drug management.     Differential diagnosis includes but is not limited to Cholelithiasis, cholangitis, choledocholithiasis, peptic ulcer, gastritis, gastroenteritis, appendicitis, IBS, IBD, DKA, nephrolithiasis, UTI, pyelonephritis, pancreatitis, diverticulitis, mesenteric ischemia,  abdominal aortic aneurysm, small bowel obstruction, volvulus   ED Course:  Upon initial evaluation, patient is well-appearing, stable vitals aside from elevated blood pressure of 173/101.  Afebrile, not tachycardic.  Mild abdominal tenderness to palpation in the epigastric region.  No rebound or guarding.  No active vomiting.  Labs Ordered: I Ordered, and personally interpreted labs.  The pertinent results include:   CBC with mildly low hemoglobin at 11.8, no leukocytosis CMP with mildly elevated creatinine at 1.03.  No electrolyte abnormalities, no elevation LFTs Lipase within normal limits  Imaging Studies ordered: I ordered imaging studies including CT abdomen pelvis I independently visualized the imaging with scope of interpretation limited to determining acute life threatening conditions related to emergency care. Imaging showed no acute abnormalities I agree with the radiologist interpretation   Medications Given: Fentanyl  for pain, Zofran  for nausea  Upon re-evaluation, patient still well-appearing with stable vitals.  Reports pain is somewhat improved with the Toradol  given.  Given unremarkable labs and imaging, abdomen soft nontender, have low concern for acute intra-abdominal pathology at this time. She is having normal bowel movements, no concern for SBO. Suspect she may have a GERD flareup given the epigastric location of pain.  She is already on Nexium  and famotidine  at home, discussed she can continue with these medications.  Stable appropriate for discharge home    Impression: Epigastric pain  Disposition:  The patient was discharged home with instructions to continue on home Nexium  and famotidine .  Follow-up with her PCP within the next week for recheck of symptoms. Return precautions given.    This chart was dictated using voice recognition software, Dragon. Despite the best efforts of this provider to proofread and correct errors, errors may still occur which can  change documentation meaning.          Final Clinical Impression(s) / ED Diagnoses Final diagnoses:  Epigastric pain    Rx / DC Orders ED Discharge Orders     None         Rexie Catena, PA-C 03/22/24 1500    Carin Charleston, MD 03/22/24 1555

## 2024-03-22 NOTE — ED Notes (Signed)
 Patient transported to CT

## 2024-03-22 NOTE — Discharge Instructions (Addendum)
 Your blood counts and electrolytes were normal today.  Your kidney, liver, and pancreas testing was normal.  Your CT of the abdomen did not show any abnormalities to explain your pain.  Your pain may be due to acid reflux.  Please continue on your home Nexium  and famotidine .  You have been prescribed Zofran  (ondansetron ) for nausea and vomiting. You may take this every 8 hours as needed for nausea and vomiting. This medication dissolves under the tongue. You do not need to swallow it.    Please follow-up with your PCP within the next week for recheck of symptoms.   Return to the ER for any worsening of pain, uncontrolled vomiting, fevers, any other new or concerning symptoms.

## 2024-03-22 NOTE — ED Triage Notes (Signed)
 Pt caox4, ambulatory, NAD c/o epigastric abd pain and nausea that was intermittent over the weekend but became unbearable this morning. Pt states "it feels like I swallowed glass."

## 2024-03-22 NOTE — ED Notes (Signed)
 Discharge instructions, follow up care, and prescriptions reviewed and explained, pt verbalized understanding and had no further questions on d/c.

## 2024-03-24 ENCOUNTER — Other Ambulatory Visit: Payer: Self-pay | Admitting: Internal Medicine

## 2024-03-24 MED ORDER — HYDROCHLOROTHIAZIDE 25 MG PO TABS: 25.0000 mg | ORAL_TABLET | Freq: Every day | ORAL | 0 refills | Status: DC

## 2024-03-24 NOTE — Therapy (Addendum)
 OUTPATIENT OCCUPATIONAL THERAPY ORTHO Re- Evaluation/ Treatment  Patient Name: [name] MRN: [id number] DOB:11-26-1965, 58 y.o., female Today's Date: 03/25/2024  PCP: Dr. Ethel Henry REFERRING PROVIDER: Dr. Alease Hunter  END OF SESSION:  OT End of Session - 03/25/24 0806     Visit Number 8    Number of Visits 24    Date for OT Re-Evaluation 05/20/24    Authorization Type [employer]/ [employer]    OT Start Time 0804    OT Stop Time 0845    OT Time Calculation (min) 41 min    Activity Tolerance Patient tolerated treatment well    Behavior During Therapy [facility] for tasks assessed/performed                  Past Medical History:  Diagnosis Date   Allergic reaction caused by a drug 01/25/2013   probably.    Allergy     Anemia    Arthritis    Asthma    GERD (gastroesophageal reflux disease)    Heart murmur    Hypertension    Positive TB test    Shingles    TOBACCO USE 03/22/2009   Qualifier: Diagnosis of  By: Ethel Henry MD, Joaquim Muir  Stopped Dec 12 2010     Past Surgical History:  Procedure Laterality Date   ABDOMINAL HYSTERECTOMY     BREAST SURGERY     cyst from left breast   BUNIONECTOMY     both feet   CHOLECYSTECTOMY     CYSTECTOMY     left shoulder   KNEE SURGERY Left    PANENDOSCOPY     TONSILLECTOMY     TUBAL LIGATION     uterine ablastion     and polyps removed   Uterine polyps     Patient Active Problem List   Diagnosis Date Noted   Right hip pain 08/13/2019   Greater trochanteric bursitis of both hips 07/01/2019   Headache 07/02/2013   Asthma 09/29/2009   Chest pain of uncertain etiology 09/18/2009   Essential hypertension 05/30/2009   GERD 05/12/2008   HOT FLASHES 05/12/2008   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   Allergic rhinitis 08/07/2007    ONSET DATE: 01/21/24  REFERRING DIAG:  Diagnosis  M25.511 (ICD-10-CM) - Acute pain of right shoulder     M79.641 (ICD-10-CM) - Right hand pain  THERAPY DIAG:  Right hand pain - Plan: Ot plan of care  cert/re-cert  Stiffness of right hand, not elsewhere classified - Plan: Ot plan of care cert/re-cert  Pain in right hand - Plan: Ot plan of care cert/re-cert  Muscle weakness (generalized) - Plan: Ot plan of care cert/re-cert  Acute pain of right shoulder - Plan: Ot plan of care cert/re-cert  Stiffness of right shoulder, not elsewhere classified - Plan: Ot plan of care cert/re-cert  Rationale for Evaluation and Treatment: Rehabilitation  SUBJECTIVE:   SUBJECTIVE STATEMENT: Pt reports right shoulder pain from a fall, Dr. Alease Hunter sent new referral Pt accompanied by: self  PERTINENT HISTORY: Pt locates pain to the 3rd finger on her R hand, MCP joint.    Per Dr. Alease Hunter 01/21/24  X-ray images right hand obtained today personally and independently interpreted. Mild degenerative changes.  Tiny erosion present at ulnar aspect of the proximal end of the proximal phalanx.  No acute fractures are visible.  Per Dr. Alease Hunter 03/21/24-Right shoulder x-ray shows mild arthritis in the main shoulder joint. It looks like you have had shoulder surgery in the past.  Per Dr. Alease Hunter- 03/18/24-right  shoulder pain due to injury and rotator cuff tendinitis and bursitis.  She may have a small tear but there was not a visible one on ultrasound.    PRECAUTIONS: None  WEIGHT BEARING RESTRICTIONS: No  PAIN:  PAIN:  Are you having pain? Yes: NPRS scale:  4/10 on arrival, improved end of session Pain location: R MCP joints, shoulder Pain description: sharp Aggravating factors: use, typing Relieving factors: rest/heat   FALLS: Has patient fallen in last 6 months? No  LIVING ENVIRONMENT: Lives with: lives alone Lives in: House/apartment   PLOF: Independent  PATIENT GOALS: reduce pain in RUE  NEXT MD VISIT: 03/05/24  OBJECTIVE:  Note: Objective measures were completed at Evaluation unless otherwise noted.  HAND DOMINANCE: Right  ADLs: From inital eval- Overall ADLs: difficulty twisting hair, pain with  walking dog  mod I with all basic ADLS Pt has repetative work environment, placing swabs in bags and typing 03/25/24- Sleeping aggravates, notices pain at night  FUNCTIONAL OUTCOME MEASURES: inital eval-Quick Dash: 35% disability, 03/25/24- 22.5%      UPPER EXTREMITY ROM:     Active ROM Right eval Left eval  Shoulder flexion 140   Shoulder abduction 100   Shoulder adduction    Shoulder extension    Shoulder internal rotation limited by pain   Shoulder external rotation    Elbow flexion    Elbow extension    Wrist flexion    Wrist extension    Wrist ulnar deviation    Wrist radial deviation    Wrist pronation    Wrist supination    (Blank rows = not tested)     UPPER EXTREMITY MMT:     MMT Right eval Left eval  Shoulder flexion 3/5   Shoulder abduction 3/5   Shoulder adduction    Shoulder extension    Shoulder internal rotation    Shoulder external rotation    Middle trapezius    Lower trapezius    Elbow flexion    Elbow extension    Wrist flexion    Wrist extension    Wrist ulnar deviation    Wrist radial deviation    Wrist pronation    Wrist supination    (Blank rows = not tested)  HAND FUNCTION: Grip strength: Right: 13 lbs; Left: 35 lbs   SENSATION: WFL  EDEMA: mild edema a 3rd MP joint, repetative work environment, placing swabs in bags  COGNITION: Overall cognitive status: Within functional limits for tasks assessed   OBSERVATIONS: Pleasant female motivated to improve.   TREATMENT DATE: 03/25/24-Re-eval completed as pt has new diagnosis of shoulder pain. US  1 mhz, 0.8 w/cm2 x 8 mins, no adverse reactions.  US  3 mhz, 0.8 w/cm2 x 8 mins, to right volar hand no adverse reactions. Red putty HEP was issued for grip and tip pinch, min v.c Parffin x 8 mins to R hand for pain management, no adverse reactions.  03/17/24- US  3 mhz, 0.8 w/cm2 x 8 mins, no adverse reactions. Reviwed previously issued HEP 10 reps each, added gentle prayer stretch  for wrist and finger extension x 5 reps hold 10 secs. Paraffin x 10 mins for pain and stiffness to right hand, while discussing activity modification. No adverse reactions. Therapist recommends use of food processor/ chopper for cutting food. Discussed splint wear, and therapist recommends pt progresses to wearing splint at night.  PATIENT EDUCATION: Education details: potential goals, red putty HEP, positioning for sleeping to minimize pain. Person educated: Patient Education method: Explanation, Demonstration, Verbal cues, and Handouts Education comprehension: verbalized understanding, returned demonstration, and verbal cues required   HOME EXERCISE PROGRAM: 02/06/24:  AROM HEP activity modification red putty exercises   GOALS: Goals reviewed with patient? Yes  SHORT TERM GOALS: Target date:04/25/24  I with initial HEP Baseline:dependent Goal status: met  2.  I with AE, activity modification/ adapted strategies to minimize pain and risk for injury Baseline: dependent Goal status: met, 02/19/24 for hand  3. I with activity modification and positioning to minimize R shoulder pain.  Baseline: dependent  Goal status: new 4. I with inital HEP for right shoulder.  Baseline: dependent  Goal status: new 5. Pt will increase R shoulder abduction to 110* for functional reach and ADLs with pain no greater than 2/10.  Baseline: 100* limited by pain  Goal status: new     LONG TERM GOALS: Target date: 05/20/24  I with updated HEP  Goal status: ongoing 03/25/24- putty issued   2.  Pt will improve Quick Dash score to 30% disability or better. Baseline: 35% disability inital eval  Goal status: met 22.5% on 03/25/24  3.  Pt will increase R grip strength to 20 lbs or better for improved functional use Baseline:RUE  13 lbs, LUE 35 lbs Goal status:  met 23 lbs 03/18/24  4.  Pt will report pain no  greater than 4/10 for ADLs/IADLs and work activities. Baseline:pain 6/10  Goal status: ongoing, overall pain at max 4-5/10 for shoulder and hand 03/25/24 5. Pt will improve Quick Dash score to 18% disability or better.   Baseline: 22.5% 03/25/24   Goals status: new 6. Pt will increase RUE grip strength to 28 Lbs or greater for increased functional use  Baseline: 03/18/24-23 lbs  Goal status :new  ASSESSMENT:  CLINICAL IMPRESSION:Pt with new diagonis of right shoulder pain as well as continued R hand pain.Per Dr. 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 Pt presents with R shoulder pain decreased strength, decreased ROM, decreased functional use. Pt can benefit from skilled OT to address pt's right shoulder and hand pain to maximize pt's safety and I with ADLs/IADLS.Aaron Aas    PERFORMANCE DEFICITS: in functional skills including ADLs, IADLs, coordination, dexterity, edema, ROM, strength, pain, flexibility, Fine motor control, Gross motor control, decreased knowledge of precautions, and UE functional use,  and psychosocial skills including coping strategies, environmental adaptation, habits, interpersonal interactions, and routines and behaviors.   IMPAIRMENTS: are limiting patient from ADLs, IADLs, rest and sleep, work, play, leisure, and social participation.   COMORBIDITIES: may have co-morbidities  that affects occupational performance. Patient will benefit from skilled OT to address above impairments and improve overall function.  MODIFICATION OR ASSISTANCE TO COMPLETE EVALUATION: No modification of tasks or assist necessary to complete an evaluation.  OT OCCUPATIONAL PROFILE AND HISTORY: Detailed assessment: Review of records and additional review of physical, cognitive, psychosocial history related to current functional performance.  CLINICAL DECISION MAKING: LOW - limited treatment options, no task modification  necessary  REHAB POTENTIAL: Good  EVALUATION COMPLEXITY: Low      PLAN:  OT FREQUENCY:2x week  OT DURATION: 8 weeks  PLANNED INTERVENTIONS: 97168 OT Re-evaluation, 97535 self care/ADL training, 40981 therapeutic exercise, 97530 therapeutic activity, 97112 neuromuscular re-education, 97140 manual therapy, 97035 ultrasound, 97018 paraffin, 19147 fluidotherapy, 97010 moist heat, 97010 cryotherapy,  97014 electrical stimulation unattended, 16109 Orthotics management and training, [phone] Splinting (initial encounter), passive range of motion, energy conservation, coping strategies training, patient/family education, and DME and/or AE instructions  RECOMMENDED OTHER SERVICES: n/a  CONSULTED AND AGREED WITH PLAN OF CARE: Patient  PLAN FOR NEXT SESSION: check on putty exercise, consider gentle shoulder stretches   Peyten Weare, OTR/L  03/25/2024, 4:06 PM

## 2024-03-24 NOTE — Telephone Encounter (Signed)
 Copied from CRM [id number]. Topic: Clinical - Medication Refill >> Mar 24, 2024 11:02 AM [name] wrote: Medication: hydrochlorothiazide  (HYDRODIURIL ) 25 MG tablet  Has the patient contacted their pharmacy? Yes (Agent: If no, request that the patient contact the pharmacy for the refill. If patient does not wish to contact the pharmacy document the reason why and proceed with request.) (Agent: If yes, when and what did the pharmacy advise?)  This is the patient's preferred pharmacy:  [id number] [name], [place] - [address]  [name] [address] Phone: [phone] Fax: [phone]  Is this the correct pharmacy for this prescription? Yes If no, delete pharmacy and type the correct one.   Has the prescription been filled recently? No  Is the patient out of the medication? Yes  Has the patient been seen for an appointment in the last year OR does the patient have an upcoming appointment? Yes  Can we respond through MyChart? No  Agent: Please be advised that Rx refills may take up to 3 business days. We ask that you follow-up with your pharmacy.

## 2024-03-25 ENCOUNTER — Encounter: Payer: Self-pay | Admitting: Occupational Therapy

## 2024-03-25 ENCOUNTER — Encounter: Payer: Self-pay | Admitting: Internal Medicine

## 2024-03-25 ENCOUNTER — Other Ambulatory Visit: Payer: Self-pay

## 2024-03-25 ENCOUNTER — Ambulatory Visit: Admitting: Occupational Therapy

## 2024-03-25 DIAGNOSIS — M25511 Pain in right shoulder: Secondary | ICD-10-CM | POA: Diagnosis not present

## 2024-03-25 DIAGNOSIS — M25611 Stiffness of right shoulder, not elsewhere classified: Secondary | ICD-10-CM

## 2024-03-25 DIAGNOSIS — M6281 Muscle weakness (generalized): Secondary | ICD-10-CM | POA: Diagnosis not present

## 2024-03-25 DIAGNOSIS — M25641 Stiffness of right hand, not elsewhere classified: Secondary | ICD-10-CM

## 2024-03-25 DIAGNOSIS — M79641 Pain in right hand: Secondary | ICD-10-CM | POA: Diagnosis not present

## 2024-03-25 DIAGNOSIS — Z79899 Other long term (current) drug therapy: Secondary | ICD-10-CM

## 2024-03-25 NOTE — Patient Instructions (Signed)
 1. Grip Strengthening (Resistive Putty)   Squeeze putty using thumb and all fingers. Repeat _20___ times. Do __1__ sessions per day.   2. Roll putty into tube on table and pinch between first each fingers and thumb x 10 reps. Do 1 sessions per day     Copyright  VHI. All rights reserved.

## 2024-03-26 NOTE — Telephone Encounter (Signed)
 Order was placed.

## 2024-03-30 ENCOUNTER — Other Ambulatory Visit (INDEPENDENT_AMBULATORY_CARE_PROVIDER_SITE_OTHER)

## 2024-03-30 DIAGNOSIS — I10 Essential (primary) hypertension: Secondary | ICD-10-CM

## 2024-03-30 DIAGNOSIS — E876 Hypokalemia: Secondary | ICD-10-CM | POA: Diagnosis not present

## 2024-03-30 DIAGNOSIS — F4320 Adjustment disorder, unspecified: Secondary | ICD-10-CM | POA: Diagnosis not present

## 2024-03-30 DIAGNOSIS — Z79899 Other long term (current) drug therapy: Secondary | ICD-10-CM | POA: Diagnosis not present

## 2024-03-30 LAB — BASIC METABOLIC PANEL WITH GFR
BUN: 12 mg/dL (ref 6–23)
CO2: 29 meq/L (ref 19–32)
Calcium: 9.8 mg/dL (ref 8.4–10.5)
Chloride: 99 meq/L (ref 96–112)
Creatinine, Ser: 1.14 mg/dL (ref 0.40–1.20)
GFR: 53.55 mL/min — ABNORMAL LOW (ref >60.00)
Glucose, Bld: 72 mg/dL (ref 70–99)
Potassium: 3.9 meq/L (ref 3.5–5.1)
Sodium: 138 meq/L (ref 135–145)

## 2024-03-30 LAB — MAGNESIUM: Magnesium: 2.1 mg/dL (ref 1.5–2.5)

## 2024-03-31 ENCOUNTER — Telehealth (INDEPENDENT_AMBULATORY_CARE_PROVIDER_SITE_OTHER): Admitting: Internal Medicine

## 2024-03-31 ENCOUNTER — Encounter: Payer: Self-pay | Admitting: Internal Medicine

## 2024-03-31 DIAGNOSIS — E876 Hypokalemia: Secondary | ICD-10-CM | POA: Diagnosis not present

## 2024-03-31 DIAGNOSIS — D649 Anemia, unspecified: Secondary | ICD-10-CM | POA: Diagnosis not present

## 2024-03-31 DIAGNOSIS — Z79899 Other long term (current) drug therapy: Secondary | ICD-10-CM | POA: Diagnosis not present

## 2024-03-31 DIAGNOSIS — I10 Essential (primary) hypertension: Secondary | ICD-10-CM

## 2024-03-31 NOTE — Progress Notes (Signed)
 Virtual Visit via Video Note  I connected with [name] on 03/31/24 at  9:15 AM EDT by a video enabled telemedicine application and verified that I am speaking with the correct person using two identifiers. Location patient: home Location provider:work office Persons participating in the virtual visit: patient, provider   Patient aware  of the limitations of evaluation and management by telemedicine and  availability of in person appointments. and agreed to proceed.   HPI: [name] presents for video visit fu ed and  labs from specilaity teams   had low potassium on valsartabn and hydrochlorothiazide  Took no supp potassium just diet changes  In interim  had episode of epigastric pain  and ed had neg eval  but hg in 11 range   Stopped th mobic  ( had been on about 4 days) and is better . In under eval for poss reaction to eggs.  Bp  may have been up since   from pain   ROS: See pertinent positives and negatives per HPI.  Past Medical History:  Diagnosis Date   Allergic reaction caused by a drug 01/25/2013   probably.    Allergy     Anemia    Arthritis    Asthma    GERD (gastroesophageal reflux disease)    Heart murmur    Hypertension    Positive TB test    Shingles    TOBACCO USE 03/22/2009   Qualifier: Diagnosis of  By: Ethel Henry MD, Joaquim Muir  Stopped Dec 12 2010      Past Surgical History:  Procedure Laterality Date   ABDOMINAL HYSTERECTOMY     BREAST SURGERY     cyst from left breast   BUNIONECTOMY     both feet   CHOLECYSTECTOMY     CYSTECTOMY     left shoulder   KNEE SURGERY Left    PANENDOSCOPY     TONSILLECTOMY     TUBAL LIGATION     uterine ablastion     and polyps removed   Uterine polyps      Family History  Problem Relation Age of Onset   Hypertension Mother    Alzheimer's disease Mother    Hypertension Father    Prostate cancer Father    HIV Brother    Lupus Sister    Colon cancer Paternal Grandmother    Esophageal cancer Neg Hx     Liver cancer Neg Hx    Pancreatic cancer Neg Hx    Rectal cancer Neg Hx    Stomach cancer Neg Hx     Social History   Tobacco Use   Smoking status: Former    Current packs/day: 0.00    Average packs/day: 0.5 packs/day for 17.0 years (8.5 ttl pk-yrs)    Types: Cigarettes    Start date: 12/12/1993    Quit date: 12/12/2010    Years since quitting: 13.3   Smokeless tobacco: Never  Vaping Use   Vaping status: Never Used  Substance Use Topics   Alcohol use: No   Drug use: No      Current Outpatient Medications:    albuterol  (PROVENTIL ) (2.5 MG/3ML) 0.083% nebulizer solution, Inhale 3 mLs (2.5 mg total) by nebulization every 6 (six) hours as needed for wheezing or shortness of breath., Disp: 125 mL, Rfl: 12   Albuterol -Budesonide  (AIRSUPRA ) 90-80 MCG/ACT AERO, Inhale 2 Inhalations into the lungs every 4 (four) hours as needed., Disp: 11 g, Rfl: 1   azelastine  (ASTELIN ) 0.1 % nasal spray, Place  1 spray into both nostrils 2 (two) times daily as needed. Use in each nostril as directed, Disp: 30 mL, Rfl: 5   BREZTRI  AEROSPHERE 160-9-4.8 MCG/ACT AERO, Inhale 2 puffs into the lungs in the morning and at bedtime., Disp: 32.1 g, Rfl: 1   cetirizine  (ZYRTEC ) 10 MG tablet, Take 1 tablet (10 mg total) by mouth daily as needed for allergies (Can take an extra dose during flare ups.)., Disp: 180 tablet, Rfl: 1   Cyanocobalamin (VITAMIN B-12 PO), Take by mouth daily., Disp: , Rfl:    EPINEPHrine  0.3 mg/0.3 mL IJ SOAJ injection, Inject 0.3 mg into the muscle as needed for anaphylaxis., Disp: 2 each, Rfl: 1   esomeprazole  (NEXIUM ) 40 MG capsule, Take 1 capsule (40 mg total) by mouth in the morning and at bedtime., Disp: 180 capsule, Rfl: 1   famotidine  (PEPCID ) 40 MG tablet, Take 1 tablet (40 mg total) by mouth at bedtime., Disp: 90 tablet, Rfl: 1   fluticasone  (FLONASE ) 50 MCG/ACT nasal spray, Place 1 spray into both nostrils in the morning and at bedtime., Disp: 48 g, Rfl: 1   hydrochlorothiazide   (HYDRODIURIL ) 25 MG tablet, Take 1 tablet (25 mg total) by mouth daily., Disp: 90 tablet, Rfl: 0   ondansetron  (ZOFRAN -ODT) 4 MG disintegrating tablet, Take 1 tablet (4 mg total) by mouth every 8 (eight) hours as needed for nausea or vomiting., Disp: 20 tablet, Rfl: 0   Probiotic Product (PROBIOTIC DAILY PO), Take by mouth., Disp: , Rfl:    Respiratory Therapy Supplies (NEBULIZER MASK ADULT) MISC, 1 kit by Does not apply route as directed., Disp: 1 each, Rfl: 1   Spacer/Aero-Holding Chambers DEVI, 1 Device by Does not apply route as directed., Disp: 1 each, Rfl: 1   valsartan  (DIOVAN ) 320 MG tablet, Take 0.5 tablets (160 mg total) by mouth daily., Disp: 45 tablet, Rfl: 1   VITAMIN D PO, Take by mouth., Disp: , Rfl:    meloxicam  (MOBIC ) 15 MG tablet, Take 1 tablet (15 mg total) by mouth daily as needed. (Patient not taking: Reported on 03/31/2024), Disp: 30 tablet, Rfl: 1  Current Facility-Administered Medications:    tezepelumab -ekko (TEZSPIRE ) 210 MG/1. syringe 210 mg, 210 mg, Subcutaneous, Q28 days, Kozlow, Rema Care, MD, 210 mg at 11/10/23 1455  EXAM: BP Readings from Last 3 Encounters:  03/22/24 (!) 153/95  03/18/24 (!) 132/96  03/10/24 (!) 142/90    VITALS per patient if applicable:  GENERAL: alert, oriented, appears well and in no acute distress  HEENT: atraumatic, conjunttiva clear, no obvious abnormalities on inspection of external nose and ears  NECK: normal movements of the head and neck  LUNGS: on inspection no signs of respiratory distress, breathing rate appears normal, no obvious gross SOB, gasping or wheezing  CV: no obvious cyanosis  MS: moves all visible extremities without noticeable abnormality  PSYCH/NEURO: pleasant and cooperative, no obvious depression or anxiety, speech and thought processing grossly intact Lab Results  Component Value Date   WBC 4.9 03/22/2024   HGB 11.8 (L) 03/22/2024   HCT 36.0 03/22/2024   PLT 332 03/22/2024   GLUCOSE 72 03/30/2024    CHOL 195 05/19/2018   TRIG 41.0 05/19/2018   HDL 83.80 05/19/2018   LDLCALC 103 (H) 05/19/2018   ALT 11 03/22/2024   AST 19 03/22/2024   NA 138 03/30/2024   K 3.9 03/30/2024   CL 99 03/30/2024   CREATININE 1.14 03/30/2024   BUN 12 03/30/2024   CO2 29 03/30/2024   TSH 0.97  05/18/2021   INR 1.0 01/17/2021   MICROALBUR 0.7 05/30/2009    ASSESSMENT AND PLAN:  Discussed the following assessment and plan:    ICD-10-CM   1. Medication management  Z79.899 CBC with Differential/Platelet    Basic metabolic panel with GFR    Microalbumin / creatinine urine ratio    2. Essential hypertension  I10 CBC with Differential/Platelet    Basic metabolic panel with GFR    Microalbumin / creatinine urine ratio    3. Low blood potassium  E87.6 CBC with Differential/Platelet    Basic metabolic panel with GFR    Microalbumin / creatinine urine ratio    4. Mild anemia  D64.9 CBC with Differential/Platelet    Basic metabolic panel with GFR    Microalbumin / creatinine urine ratio     I agree suspect the abd pain may have been from the movic anc may have effected other testing . Proceed with allergy  eval as planned  such as  low calculated gfr  Counseled.  Check bp  to ensure at goal( prev  readings were under pain or distress .  Continue valsartan /and hydrochlorothiazide  and increase potassium foods to maintain eukalemia Is off mobic  Plan  fu  lab 1 month or so  hydrated bmp , cbcdiff and urine microalbumin screen to ensure no active concern and follow  No visit needed if all ok   Expectant management and discussion of plan and treatment with opportunity to ask questions and all were answered. The patient agreed with the plan and demonstrated an understanding of the instructions.   Advised to call back or seek an in-person evaluation if worsening  or having  further concerns  in interim. No follow-ups on file.    Daphine Eagle, MD

## 2024-03-31 NOTE — Patient Instructions (Signed)
 Lab fu  in about 1 month as discussed   Continue high potassium diet  and bp monitoring

## 2024-04-01 ENCOUNTER — Encounter: Payer: Self-pay | Admitting: Occupational Therapy

## 2024-04-01 ENCOUNTER — Ambulatory Visit: Admitting: Occupational Therapy

## 2024-04-01 ENCOUNTER — Other Ambulatory Visit: Payer: Self-pay

## 2024-04-01 DIAGNOSIS — M25511 Pain in right shoulder: Secondary | ICD-10-CM | POA: Diagnosis not present

## 2024-04-01 DIAGNOSIS — M6281 Muscle weakness (generalized): Secondary | ICD-10-CM | POA: Diagnosis not present

## 2024-04-01 DIAGNOSIS — M79641 Pain in right hand: Secondary | ICD-10-CM | POA: Diagnosis not present

## 2024-04-01 DIAGNOSIS — M25641 Stiffness of right hand, not elsewhere classified: Secondary | ICD-10-CM | POA: Diagnosis not present

## 2024-04-01 DIAGNOSIS — M25611 Stiffness of right shoulder, not elsewhere classified: Secondary | ICD-10-CM | POA: Diagnosis not present

## 2024-04-01 NOTE — Therapy (Signed)
 OUTPATIENT OCCUPATIONAL THERAPY ORTHO Re- Evaluation/ Treatment  Patient Name: [name] MRN: [id number] DOB:1966/05/22, 58 y.o., female Today's Date: 04/01/2024  PCP: Dr. Ethel Henry REFERRING PROVIDER: Dr. Alease Hunter  END OF SESSION:  OT End of Session - 04/01/24 0826     Visit Number 9    Number of Visits 24    Date for OT Re-Evaluation 05/20/24    Authorization Type [employer]/ [employer]    OT Start Time 0808    OT Stop Time 0844    OT Time Calculation (min) 36 min                  Past Medical History:  Diagnosis Date   Allergic reaction caused by a drug 01/25/2013   probably.    Allergy     Anemia    Arthritis    Asthma    GERD (gastroesophageal reflux disease)    Heart murmur    Hypertension    Positive TB test    Shingles    TOBACCO USE 03/22/2009   Qualifier: Diagnosis of  By: Ethel Henry MD, Joaquim Muir  Stopped Dec 12 2010     Past Surgical History:  Procedure Laterality Date   ABDOMINAL HYSTERECTOMY     BREAST SURGERY     cyst from left breast   BUNIONECTOMY     both feet   CHOLECYSTECTOMY     CYSTECTOMY     left shoulder   KNEE SURGERY Left    PANENDOSCOPY     TONSILLECTOMY     TUBAL LIGATION     uterine ablastion     and polyps removed   Uterine polyps     Patient Active Problem List   Diagnosis Date Noted   Right hip pain 08/13/2019   Greater trochanteric bursitis of both hips 07/01/2019   Headache 07/02/2013   Asthma 09/29/2009   Chest pain of uncertain etiology 09/18/2009   Essential hypertension 05/30/2009   GERD 05/12/2008   HOT FLASHES 05/12/2008   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    Per Dr. Alease Hunter 01/21/24  X-ray images right hand obtained today personally and independently interpreted. Mild degenerative changes.  Tiny erosion present at ulnar aspect of the proximal end of the proximal phalanx.  No acute fractures are visible.  Per Dr. Alease Hunter 03/21/24-Right shoulder x-ray shows mild arthritis in the main shoulder joint. It looks like you have had shoulder surgery in the past.  Per Dr. Alease Hunter- 03/18/24-right shoulder pain due to injury and rotator cuff tendinitis and bursitis.  She may have a small tear but there was not a visible one on ultrasound.    PRECAUTIONS: None  WEIGHT BEARING RESTRICTIONS: No  PAIN:  PAIN:  Are you having pain? Yes: NPRS scale:  shoulder 2/10, 6/10 hand Pain location: R MCP joints, shoulder Pain description: sharp Aggravating factors: use, typing Relieving factors: rest/heat   FALLS: Has patient fallen in last 6 months? No  LIVING ENVIRONMENT: Lives with: lives alone Lives in: House/apartment   PLOF: Independent  PATIENT GOALS: reduce pain in RUE  NEXT MD VISIT: 03/05/24  OBJECTIVE:  Note: Objective measures were completed at Evaluation unless otherwise noted.  HAND DOMINANCE: Right  ADLs: From inital eval- Overall ADLs: difficulty twisting hair, pain with walking dog  mod I with all basic ADLS Pt has repetative work environment, placing swabs in bags and typing 03/25/24- Sleeping aggravates, notices pain at night  FUNCTIONAL OUTCOME MEASURES: inital eval-Quick Dash: 35% disability, 03/25/24- 22.5%      UPPER EXTREMITY ROM:     Active ROM Right eval Left eval  Shoulder flexion 140   Shoulder abduction 100   Shoulder adduction    Shoulder extension    Shoulder internal rotation limited by pain   Shoulder external  rotation    Elbow flexion    Elbow extension    Wrist flexion    Wrist extension    Wrist ulnar deviation    Wrist radial deviation    Wrist pronation    Wrist supination    (Blank rows = not tested)     UPPER EXTREMITY MMT:     MMT Right eval Left eval  Shoulder flexion 3/5   Shoulder abduction 3/5   Shoulder adduction    Shoulder extension    Shoulder internal rotation    Shoulder external rotation    Middle trapezius    Lower trapezius    Elbow flexion    Elbow extension    Wrist flexion    Wrist extension    Wrist ulnar deviation    Wrist radial deviation    Wrist pronation    Wrist supination    (Blank rows = not tested)  HAND FUNCTION: Grip strength: Right: 13 lbs; Left: 35 lbs   SENSATION: WFL  EDEMA: mild edema a 3rd MP joint, repetative work environment, placing swabs in bags  COGNITION: Overall cognitive status: Within functional limits for tasks assessed   OBSERVATIONS: Pleasant female motivated to improve.   TREATMENT DATE:04/01/24-US  1 mhz, 0.8 w/cm2 x 8 mins, to right upper arm, no adverse reactions while paraffin to R hand x 8 mins for pain and stiffness no adverse reactions. Education provided regarding positions to avoid to minimiz shoulder pain, including overhead reach and shoulder adduction Pt was instrcuted in gentle AA/ROM cane exercises, she was instructed to perform in supine in exercises in seated/ standing aggravates. Pt was instructed ot take a break from her putty since she is using her hand alot packing and since pain has increased. Pt was instructed to resume when pain is better.   03/25/24-Re-eval completed as pt has new diagnosis of shoulder pain. US  1 mhz, 0.8 w/cm2 x 8 mins, to right upper arm no adverse reactions.  US  3 mhz, 0.8 w/cm2 x 8 mins, to right volar hand no adverse reactions. Red putty HEP was issued for grip and tip pinch, min v.c Parffin x 8 mins to R hand for pain management, no adverse reactions.  03/17/24-  US  3 mhz, 0.8 w/cm2 x 8 mins, no adverse reactions. Reviwed previously issued HEP 10 reps each, added gentle prayer stretch for wrist and finger extension x 5 reps hold 10 secs. Paraffin x 10 mins for pain and stiffness to right hand, while discussing activity modification. No adverse reactions. Therapist recommends use of food processor/ chopper for cutting food. Discussed splint wear, and therapist recommends pt progresses to wearing splint at night.  PATIENT EDUCATION: Education details: positioning to minimize shoulder pain, cane exercises Person educated: Patient Education method: Explanation, Demonstration, Verbal cues, and Handouts Education comprehension: verbalized understanding, returned demonstration, and verbal cues required   HOME EXERCISE PROGRAM: 02/06/24:  AROM HEP activity modification red putty exercises   GOALS: Goals reviewed with patient? Yes  SHORT TERM GOALS: Target date:04/25/24  I with initial HEP Baseline:dependent Goal status: met  2.  I with AE, activity modification/ adapted strategies to minimize pain and risk for injury Baseline: dependent Goal status: met, 02/19/24 for hand  3. I with activity modification and positioning to minimize R shoulder pain.  Baseline: dependent  Goal status:  initiated ongoing- 04/01/24 4. I with inital HEP for right shoulder.  Baseline: dependent  Goal status:  ongoing, issued 04/01/24 5. Pt will increase R shoulder abduction to 110* for functional reach and ADLs with pain no greater than 2/10.  Baseline: 100* limited by pain  Goal status: ongoing limited to 100* due to pain, 04/01/24     LONG TERM GOALS: Target date: 05/20/24  I with updated HEP  Goal status: ongoing 03/25/24- putty issued   2.  Pt will improve Quick Dash score to 30% disability or better. Baseline: 35% disability inital eval  Goal status: met 22.5% on  03/25/24  3.  Pt will increase R grip strength to 20 lbs or better for improved functional use Baseline:RUE  13 lbs, LUE 35 lbs Goal status:  met 23 lbs 03/18/24  4.  Pt will report pain no greater than 4/10 for ADLs/IADLs and work activities. Baseline:pain 6/10  Goal status: ongoing, overall pain at max 4-5/10 for shoulder and hand 03/25/24 5. Pt will improve Quick Dash score to 18% disability or better.   Baseline: 22.5% 03/25/24   Goals status: new 6. Pt will increase RUE grip strength to 28 Lbs or greater for increased functional use  Baseline: 03/18/24-23 lbs  Goal status :new  ASSESSMENT:  CLINICAL IMPRESSION:Pt is progressing towards goals. Her shoulder pain is better today, hand pain is worse after using a lot packing. PERFORMANCE DEFICITS: in functional skills including ADLs, IADLs, coordination, dexterity, edema, ROM, strength, pain, flexibility, Fine motor control, Gross motor control, decreased knowledge of precautions, and UE functional use,  and psychosocial skills including coping strategies, environmental adaptation, habits, interpersonal interactions, and routines and behaviors.   IMPAIRMENTS: are limiting patient from ADLs, IADLs, rest and sleep, work, play, leisure, and social participation.   COMORBIDITIES: may have co-morbidities  that affects occupational performance. Patient will benefit from skilled OT to address above impairments and improve overall function.  MODIFICATION OR ASSISTANCE TO COMPLETE EVALUATION: No modification of tasks or assist necessary to complete an evaluation.  OT OCCUPATIONAL PROFILE AND HISTORY: Detailed assessment: Review of records and additional review of physical, cognitive, psychosocial history related to current functional performance.  CLINICAL DECISION MAKING: LOW - limited treatment options, no task modification necessary  REHAB POTENTIAL: Good  EVALUATION COMPLEXITY: Low      PLAN:  OT FREQUENCY:2x week  OT DURATION: 8  weeks  PLANNED INTERVENTIONS: 97168 OT Re-evaluation, 97535 self care/ADL training, 16109 therapeutic exercise, 97530 therapeutic activity, 97112 neuromuscular re-education, 97140 manual therapy, 97035 ultrasound, 97018 paraffin, 60454 fluidotherapy, 97010 moist heat, 97010 cryotherapy, 97014 electrical stimulation unattended, 97760 Orthotics management and training, 09811 Splinting (initial encounter), passive range of motion, energy conservation, coping strategies training, patient/family education, and DME and/or AE instructions  RECOMMENDED OTHER SERVICES: n/a  CONSULTED AND AGREED WITH PLAN OF CARE: Patient  PLAN FOR NEXT SESSION: check on putty exercise,  reveiw cane   Leldon Steege, OTR/L  04/01/2024, 8:27 AM

## 2024-04-01 NOTE — Patient Instructions (Signed)
 Perfrom laying down if standing aggravates. Heat before x 8 mins ice after x 8 mins       Active Assistive Shoulder External Rotation- laying down or seate/ standing    SIT with stick at waist level, right palm up, other palm down. Step to side, push forearm out from body with hand palm down, and keep elbows bent. Hold. Side step and return to start position. Perform 10 reps. 2X DAY   [employer] - Standing stop at eye level   With arms straight, hold cane forward at waist. Raise cane above head. Hold 3 seconds. Stop at eye level Repeat 10 times. Do 2 times per day.  Copyright  VHI. All rights reserved.  Flexion (Eccentric) - Active-Assist (Cane) stop at eye level or shoulder height    Use unaffected arm to push affected arm forward. Avoid hiking shoulder. Keep palm relaxed. Slowly lower affected arm for 3-5 seconds, increasing use of affected arm. 10 reps per set, 2 sets per day.  can do laying down  http://ecce.exer.us /152   Copyright  VHI. All rights reserved.  ROM: Flexion - Wand

## 2024-04-05 NOTE — Patient Instructions (Addendum)
 Adverse food reaction  -Continue to avoid egg. Ok to eat egg in baked goods as you do not have any problems while eating this. -Have access to your epinephrine  auto injector device at all times -Lab work to egg from 03/10/24 <0.10. Skin testing today is negative to egg with adequate controls. - Consider an in office oral food challenge to egg.  You would need to bring 2 plain scrambled eggs with you the day of the challenge.  You would need to be off all antihistamines 3 days prior to this appointment and in good health.  (No recent antibiotics or vaccines in the past 7 days).  This appointment will last approximately 2 to 4 hours.  If you are not interested in doing the in office oral food challenge to egg you need to continue to avoid egg.  However it is okay to continue to eat egg in baked goods since you do not have any problems.   Right Shoulder Pain: no longer having  right shoulder pain - Stopped Tezspire  due to shoulder pain   1.  Allergen avoidance measures - dust mite, pollens, foods?   2.  Treat and prevent inflammation:  A. Breztri  - 2 inhalations 2 times per day w/spacer (empty lungs) B. Flonase  - 1 spray each nostril 1-2 times per day C.  Consider switching to IL-5 agent and information on fasenra and nucala given today (AEC 200 08/28/23)  3.  Treat and prevent LPR:  A. Minimize caffeine consumption B. Nexium  40 mg - 1 tablet 1-2 times per day C. Famotidine  40 mg - 1 tablet in evening D. Replace all throat clearing with drinking/swallowing maneuver E. Keep upcoming appointment with GI on 04/12/24  4.  If needed:  A. AIRSUPRA  - 2 inhalations or nebulizer every 4-6 hours B. Cetirizine  10 mg - 1 tablet 1 time per day C. Epi-Pen, benadryl , MD/ER evaluation for allergic reaction  5.  Follow up in 4-6 months or sooner if needed

## 2024-04-06 ENCOUNTER — Encounter: Payer: Self-pay | Admitting: Family

## 2024-04-06 ENCOUNTER — Ambulatory Visit (INDEPENDENT_AMBULATORY_CARE_PROVIDER_SITE_OTHER): Payer: BC Managed Care – PPO | Admitting: Family

## 2024-04-06 VITALS — Temp 97.7°F

## 2024-04-06 DIAGNOSIS — J3089 Other allergic rhinitis: Secondary | ICD-10-CM

## 2024-04-06 DIAGNOSIS — K219 Gastro-esophageal reflux disease without esophagitis: Secondary | ICD-10-CM | POA: Diagnosis not present

## 2024-04-06 DIAGNOSIS — J4550 Severe persistent asthma, uncomplicated: Secondary | ICD-10-CM | POA: Diagnosis not present

## 2024-04-06 DIAGNOSIS — J302 Other seasonal allergic rhinitis: Secondary | ICD-10-CM

## 2024-04-06 DIAGNOSIS — T781XXD Other adverse food reactions, not elsewhere classified, subsequent encounter: Secondary | ICD-10-CM | POA: Diagnosis not present

## 2024-04-06 NOTE — Progress Notes (Signed)
 [address] [place] [address] Dept: [phone]  FOLLOW UP NOTE  Patient ID: [name], female    DOB: 12/07/1965  Age: 58 y.o. MRN: [id number] Date of Office Visit: 04/06/2024  Assessment  Chief Complaint: No chief complaint on file.  HPI [name] is a 58 year old female who presents today for follow-up adverse food reaction, severe persistent asthma, laryngopharyngeal reflux disease, and seasonal and perennial allergic rhinitis.[name]  She was last seen by myself on Mar 10, 2024.  She denies any new diagnosis or surgery since her last office visit.  Adverse food reaction: She continues to avoid egg without any ingestion or use of her epinephrine  autoinjector device.  She does continue to eat egg in baked goods without any problems.  She reports that she has been off Zyrtec  for the past 3 days in preparation for skin testing to egg.  In the past she has had 3 separate occasions where she has tried boiled egg, fried egg, and scrambled egg and had symptoms.  When she has tried the eggs that she has felt like a razor blade was in her stomach and she would feel nauseous.  She would just feel bad.  She denies any concomitant cardiorespiratory or cutaneous symptoms.  When she took Gaviscon and drank ginger ale this helped a little, but she felt like she wanted to throw up but could not.  Severe persistent asthma: She continues to take Breztri  2 puffs twice a day with spacer and has Airsupra  to use as needed.  She denies cough, wheeze, tightness in chest, shortness of breath, and nocturnal awakenings due to breathing problems.  Since her last office visit she has not required any systemic steroids or made any trips to the emergency room or urgent care due to breathing problems.  She has not had to use her Airsupra  since we last saw her.  She thinks that she has not used Airsupra  in the past 3 months.  Laryngopharyngeal reflux disease: She reports since her last office visit she did go to  the emergency room due to having a reflux flare.  She is now scheduled to see GI with [facility] on June 9.  She does continue to take Nexium  40 mg twice a day and famotidine  40 mg once a day.  She did have a CT abdomen and pelvis with contrast on Mar 22, 2024 showing: " IMPRESSION: 1. No acute intra-abdominal or pelvic pathology. 2. No bowel obstruction. Normal appendix."  Seasonal and perennial allergic rhinitis: She reports that since being off Zyrtec  for 3 days that she has been itchy in places that she has never been itchy.   Drug Allergies:  Allergies  Allergen Reactions   Purified Water [Water, Sterile] Anaphylaxis   Allegra [Fexofenadine] Hives   Chocolate Other (See Comments)    triggers Asthma   Cimetidine Hives   Citric Acid Other (See Comments)    REACTION: swelling, hives, itching   Coly-Mycin S Other (See Comments)    REACTION: pt reports she passed out   Diovan  Hct [Valsartan -Hydrochlorothiazide ] Swelling    Had facial swelling and throat itching after 2 doses of the generic had done well on the brand medicine for a while.  See ED visit February 2014   Amlodipine  Other (See Comments)    At 5 mg dose; swelling    Review of Systems: Negative except as per HPI   Physical Exam: Temp 97.7 F (36.5 C) (Temporal)    Physical Exam Constitutional:  Appearance: Normal appearance.  HENT:     Head: Normocephalic and atraumatic.     Comments: Pharynx normal, eyes normal, ears normal, nose: Bilateral lower turbinates mildly edematous with no drainage noted    Right Ear: Tympanic membrane, ear canal and external ear normal.     Left Ear: Tympanic membrane, ear canal and external ear normal.     Mouth/Throat:     Mouth: Mucous membranes are moist.     Pharynx: Oropharynx is clear.  Eyes:     Conjunctiva/sclera: Conjunctivae normal.  Cardiovascular:     Rate and Rhythm: Regular rhythm.     Heart sounds: Normal heart sounds.  Pulmonary:     Effort: Pulmonary  effort is normal.     Breath sounds: Normal breath sounds.     Comments: Lungs clear to auscultation Musculoskeletal:     Cervical back: Neck supple.  Skin:    General: Skin is warm.  Neurological:     Mental Status: She is alert and oriented to person, place, and time.  Psychiatric:        Mood and Affect: Mood normal.        Behavior: Behavior normal.        Thought Content: Thought content normal.        Judgment: Judgment normal.     Diagnostics: Skin prick testing to egg today was negative with adequate controls   Food Adult Perc - 04/06/24 1500     Time Antigen Placed 1536    Allergen Manufacturer Floyd Hutchinson    Location Arm    Number of allergen test 3     Control-buffer 50% Glycerol Negative    Control-Histamine 3+    7. Egg White, Chicken Negative              Assessment and Plan: 1. Adverse food reaction, subsequent encounter   2. Severe persistent asthma without complication   3. LPRD (laryngopharyngeal reflux disease)   4. Seasonal and perennial allergic rhinitis     No orders of the defined types were placed in this encounter.   Patient Instructions   Adverse food reaction  -For now continue to avoid egg. Ok to eat egg in baked goods as you do not have any problems while eating this. -Have access to your epinephrine  auto injector device at all times -Lab work to egg from 03/10/24 <0.10. Skin testing today is   Right Shoulder Pain: no longer having  right shoulder pain - Stopped Tezspire  due to shoulder pain   1.  Allergen avoidance measures - dust mite, pollens, foods?   2.  Treat and prevent inflammation:  A. Breztri  - 2 inhalations 2 times per day w/spacer (empty lungs) B. Flonase  - 1 spray each nostril 1-2 times per day C.  Consider switching to IL-5 agent and information on fasenra and nucala given today (AEC 200 08/28/23)  3.  Treat and prevent LPR:  A. Minimize caffeine consumption B. Nexium  40 mg - 1 tablet 1-2 times per day C.  Famotidine  40 mg - 1 tablet in evening D. Replace all throat clearing with drinking/swallowing maneuver E. Keep upcoming appointment with GI on 04/12/24  4.  If needed:  A. AIRSUPRA  - 2 inhalations or nebulizer every 4-6 hours B. Cetirizine  10 mg - 1 tablet 1 time per day C. Epi-Pen, benadryl , MD/ER evaluation for allergic reaction  5.  Follow up in months or sooner if needed No follow-ups on file.    Thank you for the opportunity to  care for this patient.  Please do not hesitate to contact me with questions.  Tinnie Forehand, FNP [facility] 

## 2024-04-08 ENCOUNTER — Ambulatory Visit: Admitting: Occupational Therapy

## 2024-04-19 ENCOUNTER — Ambulatory Visit: Admitting: Occupational Therapy

## 2024-04-26 ENCOUNTER — Ambulatory Visit: Admitting: Occupational Therapy

## 2024-05-04 ENCOUNTER — Ambulatory Visit: Admitting: Occupational Therapy

## 2024-05-05 DIAGNOSIS — F4320 Adjustment disorder, unspecified: Secondary | ICD-10-CM | POA: Diagnosis not present

## 2024-05-20 DIAGNOSIS — K222 Esophageal obstruction: Secondary | ICD-10-CM | POA: Diagnosis not present

## 2024-05-20 DIAGNOSIS — K449 Diaphragmatic hernia without obstruction or gangrene: Secondary | ICD-10-CM | POA: Diagnosis not present

## 2024-05-20 DIAGNOSIS — K2100 Gastro-esophageal reflux disease with esophagitis, without bleeding: Secondary | ICD-10-CM | POA: Diagnosis not present

## 2024-05-20 DIAGNOSIS — R1319 Other dysphagia: Secondary | ICD-10-CM | POA: Diagnosis not present

## 2024-05-25 DIAGNOSIS — F4320 Adjustment disorder, unspecified: Secondary | ICD-10-CM | POA: Diagnosis not present

## 2024-06-07 ENCOUNTER — Other Ambulatory Visit: Payer: Self-pay | Admitting: Internal Medicine

## 2024-06-09 DIAGNOSIS — F4320 Adjustment disorder, unspecified: Secondary | ICD-10-CM | POA: Diagnosis not present

## 2024-06-10 ENCOUNTER — Other Ambulatory Visit

## 2024-06-10 DIAGNOSIS — D649 Anemia, unspecified: Secondary | ICD-10-CM

## 2024-06-10 DIAGNOSIS — E876 Hypokalemia: Secondary | ICD-10-CM

## 2024-06-10 DIAGNOSIS — Z79899 Other long term (current) drug therapy: Secondary | ICD-10-CM | POA: Diagnosis not present

## 2024-06-10 DIAGNOSIS — I10 Essential (primary) hypertension: Secondary | ICD-10-CM | POA: Diagnosis not present

## 2024-06-11 LAB — CBC WITH DIFFERENTIAL/PLATELET
Basophils Absolute: 0 K/uL (ref 0.0–0.1)
Basophils Relative: 0.5 % (ref 0.0–3.0)
Eosinophils Absolute: 0.3 K/uL (ref 0.0–0.7)
Eosinophils Relative: 5.5 % — ABNORMAL HIGH (ref 0.0–5.0)
HCT: 39.1 % (ref 36.0–46.0)
Hemoglobin: 12.5 g/dL (ref 12.0–15.0)
Lymphocytes Relative: 39.8 % (ref 12.0–46.0)
Lymphs Abs: 2.1 K/uL (ref 0.7–4.0)
MCHC: 32 g/dL (ref 30.0–36.0)
MCV: 81.3 fl (ref 78.0–100.0)
Monocytes Absolute: 0.4 K/uL (ref 0.1–1.0)
Monocytes Relative: 6.8 % (ref 3.0–12.0)
Neutro Abs: 2.5 K/uL (ref 1.4–7.7)
Neutrophils Relative %: 47.4 % (ref 43.0–77.0)
Platelets: 349 K/uL (ref 150.0–400.0)
RBC: 4.81 Mil/uL (ref 3.87–5.11)
RDW: 16.8 % — ABNORMAL HIGH (ref 11.5–15.5)
WBC: 5.2 K/uL (ref 4.0–10.5)

## 2024-06-11 LAB — BASIC METABOLIC PANEL WITH GFR
BUN: 12 mg/dL (ref 6–23)
CO2: 31 meq/L (ref 19–32)
Calcium: 9.4 mg/dL (ref 8.4–10.5)
Chloride: 97 meq/L (ref 96–112)
Creatinine, Ser: 1.16 mg/dL (ref 0.40–1.20)
GFR: 52.37 mL/min — ABNORMAL LOW (ref >60.00)
Glucose, Bld: 79 mg/dL (ref 70–99)
Potassium: 3.5 meq/L (ref 3.5–5.1)
Sodium: 139 meq/L (ref 135–145)

## 2024-06-11 LAB — MICROALBUMIN / CREATININE URINE RATIO
Creatinine,U: 53.7 mg/dL
Microalb Creat Ratio: UNDETERMINED mg/g (ref 0.0–30.0)
Microalb, Ur: <0.7 mg/dL

## 2024-06-14 ENCOUNTER — Ambulatory Visit: Payer: Self-pay | Admitting: Internal Medicine

## 2024-06-15 ENCOUNTER — Encounter: Payer: Self-pay | Admitting: Internal Medicine

## 2024-06-15 ENCOUNTER — Telehealth (INDEPENDENT_AMBULATORY_CARE_PROVIDER_SITE_OTHER): Admitting: Internal Medicine

## 2024-06-15 ENCOUNTER — Other Ambulatory Visit: Payer: Self-pay | Admitting: Internal Medicine

## 2024-06-15 DIAGNOSIS — Z79899 Other long term (current) drug therapy: Secondary | ICD-10-CM | POA: Diagnosis not present

## 2024-06-15 DIAGNOSIS — R944 Abnormal results of kidney function studies: Secondary | ICD-10-CM

## 2024-06-15 DIAGNOSIS — Z809 Family history of malignant neoplasm, unspecified: Secondary | ICD-10-CM

## 2024-06-15 DIAGNOSIS — Z820 Family history of epilepsy and other diseases of the nervous system: Secondary | ICD-10-CM

## 2024-06-15 DIAGNOSIS — I10 Essential (primary) hypertension: Secondary | ICD-10-CM

## 2024-06-15 NOTE — Progress Notes (Signed)
Reviewed at [facility] today

## 2024-06-15 NOTE — Progress Notes (Signed)
 Virtual Visit via Video Note  I connected with [name] on 06/15/24 at  8:30 AM EDT by a video enabled telemedicine application and verified that I am speaking with the correct person using two identifiers. Location patient: vehicle  Location provider:work office Persons participating in the virtual visit: patient, provider   Patient aware  of the limitations of evaluation and management by telemedicine and  availability of in person appointments. and agreed to proceed.   HPI: [name] presents for video visit fu labs  Bp in 134/82 range most at home  taking valsartan  and  hydrochlorothiazide   Not taking meloxicam  now only took a few days.   Asthma some inc sx in new apt neighbors smokes and she smells  odor in closet.  Has had lipids other test at work   Asks about genetic testing since  family hx of  cancer on fathers side  everyone  Mom alzheimer and sis with luipus   ROS: See pertinent positives and negatives per HPI.  Past Medical History:  Diagnosis Date   Allergic reaction caused by a drug 01/25/2013   probably.    Allergy     Anemia    Arthritis    Asthma    GERD (gastroesophageal reflux disease)    Heart murmur    Hypertension    Positive TB test    Shingles    TOBACCO USE 03/22/2009   Qualifier: Diagnosis of  By: Charlett MD, Apolinar POUR  Stopped Dec 12 2010      Past Surgical History:  Procedure Laterality Date   ABDOMINAL HYSTERECTOMY     BREAST SURGERY     cyst from left breast   BUNIONECTOMY     both feet   CHOLECYSTECTOMY     CYSTECTOMY     left shoulder   KNEE SURGERY Left    PANENDOSCOPY     TONSILLECTOMY     TUBAL LIGATION     uterine ablastion     and polyps removed   Uterine polyps      Family History  Problem Relation Age of Onset   Hypertension Mother    Alzheimer's disease Mother    Hypertension Father    Prostate cancer Father    HIV Brother    Lupus Sister    Colon cancer Paternal Grandmother    Esophageal cancer  Neg Hx    Liver cancer Neg Hx    Pancreatic cancer Neg Hx    Rectal cancer Neg Hx    Stomach cancer Neg Hx     Social History   Tobacco Use   Smoking status: Former    Current packs/day: 0.00    Average packs/day: 0.5 packs/day for 17.0 years (8.5 ttl pk-yrs)    Types: Cigarettes    Start date: 12/12/1993    Quit date: 12/12/2010    Years since quitting: 13.5   Smokeless tobacco: Never  Vaping Use   Vaping status: Never Used  Substance Use Topics   Alcohol use: No   Drug use: No      Current Outpatient Medications:    albuterol  (PROVENTIL ) (2.5 MG/3ML) 0.083% nebulizer solution, Inhale 3 mLs (2.5 mg total) by nebulization every 6 (six) hours as needed for wheezing or shortness of breath., Disp: 125 mL, Rfl: 12   Albuterol -Budesonide  (AIRSUPRA ) 90-80 MCG/ACT AERO, Inhale 2 Inhalations into the lungs every 4 (four) hours as needed., Disp: 11 g, Rfl: 1   azelastine  (ASTELIN ) 0.1 % nasal spray, Place 1 spray into  both nostrils 2 (two) times daily as needed. Use in each nostril as directed, Disp: 30 mL, Rfl: 5   BREZTRI  AEROSPHERE 160-9-4.8 MCG/ACT AERO, Inhale 2 puffs into the lungs in the morning and at bedtime., Disp: 32.1 g, Rfl: 1   cetirizine  (ZYRTEC ) 10 MG tablet, Take 1 tablet (10 mg total) by mouth daily as needed for allergies (Can take an extra dose during flare ups.)., Disp: 180 tablet, Rfl: 1   Cyanocobalamin (VITAMIN B-12 PO), Take by mouth daily., Disp: , Rfl:    EPINEPHrine  0.3 mg/0.3 mL IJ SOAJ injection, Inject 0.3 mg into the muscle as needed for anaphylaxis., Disp: 2 each, Rfl: 1   esomeprazole  (NEXIUM ) 40 MG capsule, Take 1 capsule (40 mg total) by mouth in the morning and at bedtime., Disp: 180 capsule, Rfl: 1   famotidine  (PEPCID ) 40 MG tablet, Take 1 tablet (40 mg total) by mouth at bedtime., Disp: 90 tablet, Rfl: 1   fluticasone  (FLONASE ) 50 MCG/ACT nasal spray, Place 1 spray into both nostrils in the morning and at bedtime., Disp: 48 g, Rfl: 1    hydrochlorothiazide  (HYDRODIURIL ) 25 MG tablet, Take 1 tablet (25 mg total) by mouth daily., Disp: 90 tablet, Rfl: 0   ondansetron  (ZOFRAN -ODT) 4 MG disintegrating tablet, Take 1 tablet (4 mg total) by mouth every 8 (eight) hours as needed for nausea or vomiting., Disp: 20 tablet, Rfl: 0   Probiotic Product (PROBIOTIC DAILY PO), Take by mouth., Disp: , Rfl:    Respiratory Therapy Supplies (NEBULIZER MASK ADULT) MISC, 1 kit by Does not apply route as directed., Disp: 1 each, Rfl: 1   Spacer/Aero-Holding Chambers DEVI, 1 Device by Does not apply route as directed., Disp: 1 each, Rfl: 1   valsartan  (DIOVAN ) 320 MG tablet, TAKE 0.5 TABLETS (160 MG TOTAL) BY MOUTH DAILY., Disp: 45 tablet, Rfl: 1   VITAMIN D PO, Take by mouth., Disp: , Rfl:    meloxicam  (MOBIC ) 15 MG tablet, Take 1 tablet (15 mg total) by mouth daily as needed. (Patient not taking: Reported on 06/15/2024), Disp: 30 tablet, Rfl: 1  Current Facility-Administered Medications:    tezepelumab -ekko (TEZSPIRE ) 210 MG/1. syringe 210 mg, 210 mg, Subcutaneous, Q28 days, Kozlow, Camellia PARAS, MD, 210 mg at 11/10/23 1455  EXAM: BP Readings from Last 3 Encounters:  03/22/24 (!) 153/95  03/18/24 (!) 132/96  03/10/24 (!) 142/90    VITALS per patient if applicable:  GENERAL: alert, oriented, appears well and in no acute distress some raspiness of voice   HEENT: atraumatic, conjunttiva clear, no obvious abnormalities on inspection of external nose and ears  NECK: normal movements of the head and neck  LUNGS: on inspection no signs of respiratory distress, breathing rate appears normal, no obvious gross SOB, gasping or wheezing  CV: no obvious cyanosis  MS: moves all visible extremities without noticeable abnormality  PSYCH/NEURO: pleasant and cooperative, no obvious depression or anxiety, speech and thought processing grossly intact Lab Results  Component Value Date   WBC 5.2 06/10/2024   HGB 12.5 06/10/2024   HCT 39.1 06/10/2024   PLT  349.0 06/10/2024   GLUCOSE 79 06/10/2024   CHOL 195 05/19/2018   TRIG 41.0 05/19/2018   HDL 83.80 05/19/2018   LDLCALC 103 (H) 05/19/2018   ALT 11 03/22/2024   AST 19 03/22/2024   NA 139 06/10/2024   K 3.5 06/10/2024   CL 97 06/10/2024   CREATININE 1.16 06/10/2024   BUN 12 06/10/2024   CO2 31 06/10/2024   TSH  0.97 05/18/2021   INR 1.0 01/17/2021   MICROALBUR <0.7 06/10/2024    ASSESSMENT AND PLAN:  Discussed the following assessment and plan:    ICD-10-CM   1. Essential hypertension  I10     2. Family history of cancer  Z80.9    paternal side    3. Medication management  Z79.899     4. Decreased calculated GFR  R94.4    could be valsartan  effect control bp and recheck 3-4 mos with cystatin c ( not taking meloxicam  now)    5. Family history of Alzheimer disease  Z82.0    maternal side     Strong family hx of cancer on fathers side  Mom alzhiemers sis lupus  Refr to genetic counseling for consult. Continue bp control  meds  Plan fu lab in 3-4 mos bmp and  cystatin c   Counseled.   Expectant management and discussion of plan and treatment with opportunity to ask questions and all were answered. The patient agreed with the plan and demonstrated an understanding of the instructions.   Advised to call back or seek an in-person evaluation if worsening  or having  further concerns  in interim. Return for lab in 3-4 months.    Apolinar Eastern, MD

## 2024-06-15 NOTE — Progress Notes (Signed)
 Future lab

## 2024-07-01 ENCOUNTER — Other Ambulatory Visit: Payer: Self-pay | Admitting: Internal Medicine

## 2024-07-06 DIAGNOSIS — F4320 Adjustment disorder, unspecified: Secondary | ICD-10-CM | POA: Diagnosis not present

## 2024-07-08 ENCOUNTER — Telehealth: Payer: Self-pay | Admitting: Internal Medicine

## 2024-07-08 NOTE — Telephone Encounter (Unsigned)
 Copied from CRM [id number]. Topic: General - Other >> Jul 08, 2024 12:57 PM Gennette ORN wrote: Reason for CRM: Justice from [facility] is calling for procedure codes for genetic testing. [phone].

## 2024-07-08 NOTE — Telephone Encounter (Signed)
 See my last note  referred to genetic counseling  and they should order whatever test is advised   has  strong fam hx of cancer and alzheimers  I dont have a procedure code  this is a dx and consult referral

## 2024-07-13 ENCOUNTER — Ambulatory Visit (INDEPENDENT_AMBULATORY_CARE_PROVIDER_SITE_OTHER): Admitting: Allergy and Immunology

## 2024-07-13 VITALS — BP 150/82 | HR 62 | Temp 98.0°F | Ht 60.0 in | Wt 155.1 lb

## 2024-07-13 DIAGNOSIS — K219 Gastro-esophageal reflux disease without esophagitis: Secondary | ICD-10-CM | POA: Diagnosis not present

## 2024-07-13 DIAGNOSIS — T781XXD Other adverse food reactions, not elsewhere classified, subsequent encounter: Secondary | ICD-10-CM

## 2024-07-13 DIAGNOSIS — J4550 Severe persistent asthma, uncomplicated: Secondary | ICD-10-CM | POA: Diagnosis not present

## 2024-07-13 DIAGNOSIS — T781XXA Other adverse food reactions, not elsewhere classified, initial encounter: Secondary | ICD-10-CM

## 2024-07-13 DIAGNOSIS — J301 Allergic rhinitis due to pollen: Secondary | ICD-10-CM | POA: Diagnosis not present

## 2024-07-13 MED ORDER — AIRSUPRA 90-80 MCG/ACT IN AERO: 2.0000 | INHALATION_SPRAY | RESPIRATORY_TRACT | 1 refills | Status: DC | PRN

## 2024-07-13 MED ORDER — EPINEPHRINE 0.3 MG/0.3ML IJ SOAJ: 0.3000 mg | INTRAMUSCULAR | 1 refills | Status: DC | PRN

## 2024-07-13 MED ORDER — FLUTICASONE PROPIONATE 50 MCG/ACT NA SUSP: 1.0000 | Freq: Two times a day (BID) | NASAL | 1 refills | Status: DC

## 2024-07-13 MED ORDER — FAMOTIDINE 40 MG PO TABS: 40.0000 mg | ORAL_TABLET | Freq: Every evening | ORAL | 1 refills | Status: DC

## 2024-07-13 MED ORDER — ESOMEPRAZOLE MAGNESIUM 40 MG PO CPDR: 40.0000 mg | DELAYED_RELEASE_CAPSULE | Freq: Two times a day (BID) | ORAL | 1 refills | Status: DC

## 2024-07-13 MED ORDER — BUDESONIDE-FORMOTEROL FUMARATE 160-4.5 MCG/ACT IN AERO: 2.0000 | INHALATION_SPRAY | Freq: Two times a day (BID) | RESPIRATORY_TRACT | 12 refills | Status: DC

## 2024-07-13 MED ORDER — CETIRIZINE HCL 10 MG PO TABS: 10.0000 mg | ORAL_TABLET | Freq: Every day | ORAL | 1 refills | Status: DC | PRN

## 2024-07-13 NOTE — Progress Notes (Unsigned)
 [place] [facility] [place] [facility] [place] [facility] [place] -    Follow-up Note  Referring Provider: Charlett Apolinar POUR, MD Primary Provider: Charlett Apolinar POUR, MD Date of Office Visit: 07/13/2024  Subjective:   [name] (DOB: 03-Feb-1966) is a 58 y.o. female who [facility] in re-evaluation of the following:  HPI: Dhani returns to this clinic in reevaluation severe asthma, perennial and seasonal allergic rhinitis, LPR adverse reaction to egg consumption.  I last saw her in this clinic 14 October 2023.  During her last visit we started her on tezepelumab  injections.  She received 1 injection and she developed joint pain involving her right arm and her right jaw and she would not use any more of these injections.  Fortunately, her asthma is been doing pretty well while using her Breztri  on a pretty consistent basis.  While doing so she rarely uses a short acting bronchodilator and she can exert herself without any problem and she has not required a systemic steroid to treat an exacerbation of asthma.  And she has had very little problems with her upper airway while using Flonase  on a pretty consistent basis.  She has not required antibiotic treatment episode of sinusitis.  But she is developing a problem with her throat.  She is getting very raspy and she feels as though there is sand in her throat.  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  She has horrendous and terrible reflux.  This occurs even though she has been using Nexium  twice a day and famotidine  in the evening.  She has burning throughout her chest.  She has obvious obstruction in the swallowing on occasion.  She has already had some esophageal dilations performed in the past.  It does not sound as though she has had biopsy of her esophagus in the past.  She is scheduled to see gastroenterology at the end of this month for an upper endoscopy and follow-up  colonoscopy.  She remains away from partially cooked egg and has not had any problems while eating baked egg products at this point.  She develops predominantly a GI issue when she eats partially cooked egg.  Allergies as of 07/13/2024       Reactions   Purified Water [water, Sterile] Anaphylaxis   Allegra [fexofenadine] Hives   Chocolate Other (See Comments)   triggers Asthma   Cimetidine Hives   Citric Acid Other (See Comments)   REACTION: swelling, hives, itching   Coly-mycin S Other (See Comments)   REACTION: pt reports she passed out   Diovan  Hct [valsartan -hydrochlorothiazide ] Swelling   Had facial swelling and throat itching after 2 doses of the generic had done well on the brand medicine for a while.  See ED visit February 2014   Amlodipine  Other (See Comments)   At 5 mg dose; swelling        Medication List    Airsupra  90-80 MCG/ACT Aero Generic drug: Albuterol -Budesonide  Inhale 2 Inhalations into the lungs every 4 (four) hours as needed.   albuterol  (2.5 MG/3ML) 0.083% nebulizer solution Commonly known as: PROVENTIL  Inhale 3 mLs (2.5 mg total) by nebulization every 6 (six) hours as needed for wheezing or shortness of breath.   azelastine  0.1 % nasal spray Commonly known as: ASTELIN  Place 1 spray into both nostrils 2 (two) times daily as needed. Use in each nostril as directed   Breztri  Aerosphere 160-9-4.8 MCG/ACT Aero inhaler Generic drug: budesonide -glycopyrrolate-formoterol   Inhale 2 puffs into the lungs in the morning and at bedtime.   cetirizine  10 MG tablet Commonly known as: ZYRTEC  Take 1 tablet (10 mg total) by mouth daily as needed for allergies (Can take an extra dose during flare ups.).   EPINEPHrine  0.3 mg/0.3 mL Soaj injection Commonly known as: EPI-PEN Inject 0.3 mg into the muscle as needed for anaphylaxis.   esomeprazole  40 MG capsule Commonly known as: NEXIUM  Take 1 capsule (40 mg total) by mouth in the morning and at bedtime.    famotidine  40 MG tablet Commonly known as: PEPCID  Take 1 tablet (40 mg total) by mouth at bedtime.   fluticasone  50 MCG/ACT nasal spray Commonly known as: FLONASE  Place 1 spray into both nostrils in the morning and at bedtime.   hydrochlorothiazide  25 MG tablet Commonly known as: HYDRODIURIL  TAKE 1 TABLET (25 MG TOTAL) BY MOUTH DAILY.   Nebulizer Mask Adult Misc 1 kit by Does not apply route as directed.   ondansetron  4 MG disintegrating tablet Commonly known as: ZOFRAN -ODT Take 1 tablet (4 mg total) by mouth every 8 (eight) hours as needed for nausea or vomiting.   PROBIOTIC DAILY PO Take by mouth.   Spacer/Aero-Holding [employer] 1 Device by Does not apply route as directed.   valsartan  320 MG tablet Commonly known as: DIOVAN  TAKE 0.5 TABLETS (160 MG TOTAL) BY MOUTH DAILY.   VITAMIN B-12 PO Take by mouth daily.   VITAMIN D PO Take by mouth.    Past Medical History:  Diagnosis Date   Allergic reaction caused by a drug 01/25/2013   probably.    Allergy     Anemia    Arthritis    Asthma    GERD (gastroesophageal reflux disease)    Heart murmur    Hypertension    Positive TB test    Shingles    TOBACCO USE 03/22/2009   Qualifier: Diagnosis of  By: Charlett MD, Apolinar POUR  Stopped Dec 12 2010      Past Surgical History:  Procedure Laterality Date   ABDOMINAL HYSTERECTOMY     BREAST SURGERY     cyst from left breast   BUNIONECTOMY     both feet   CHOLECYSTECTOMY     CYSTECTOMY     left shoulder   KNEE SURGERY Left    PANENDOSCOPY     TONSILLECTOMY     TUBAL LIGATION     uterine ablastion     and polyps removed   Uterine polyps      Review of systems negative except as noted in HPI / PMHx or noted below:  Review of Systems  Constitutional: Negative.   HENT: Negative.    Eyes: Negative.   Respiratory: Negative.    Cardiovascular: Negative.   Gastrointestinal: Negative.   Genitourinary: Negative.   Musculoskeletal: Negative.   Skin: Negative.    Neurological: Negative.   Endo/Heme/Allergies: Negative.   Psychiatric/Behavioral: Negative.       Objective:   Vitals:   07/13/24 1617  BP: (!) 150/82  Pulse: 62  Temp: 98 F (36.7 C)  SpO2: 96%   Height: 5' (152.4 cm)  Weight: 155 lb 1.6 oz (70.4 kg)   Physical Exam Constitutional:      Appearance: She is not diaphoretic.     Comments: Raspy voice  HENT:     Head: Normocephalic.     Right Ear: Tympanic membrane, ear canal and external ear normal.     Left Ear: Tympanic membrane, ear canal  and external ear normal.     Nose: Nose normal. No mucosal edema or rhinorrhea.     Mouth/Throat:     Pharynx: Uvula midline. No oropharyngeal exudate.  Eyes:     Conjunctiva/sclera: Conjunctivae normal.  Neck:     Thyroid: No thyromegaly.     Trachea: Trachea normal. No tracheal tenderness or tracheal deviation.  Cardiovascular:     Rate and Rhythm: Normal rate and regular rhythm.     Heart sounds: Normal heart sounds, S1 normal and S2 normal. No murmur heard. Pulmonary:     Effort: No respiratory distress.     Breath sounds: Normal breath sounds. No stridor. No wheezing or rales.  Lymphadenopathy:     Head:     Right side of head: No tonsillar adenopathy.     Left side of head: No tonsillar adenopathy.     Cervical: No cervical adenopathy.  Skin:    Findings: No erythema or rash.     Nails: There is no clubbing.  Neurological:     Mental Status: She is alert.     Diagnostics: Spirometry was performed and demonstrated an FEV1 of 1.62 at 81 % of predicted.  Assessment and Plan:   1. Seasonal allergic rhinitis due to pollen   2. Severe persistent asthma without complication   3. Adverse food reaction, initial encounter   4. LPRD (laryngopharyngeal reflux disease)    1.  Allergen avoidance measures - dust mite, pollens, egg  2.  Treat and prevent inflammation:  A. Change Breztri  to Symbicort  160 - 2 inhalations 2 times per day w/spacer  B. Flonase  - 1 spray each  nostril 1-2 times per day  3.  Treat and prevent reflux / LPR:  A. Minimize caffeine consumption B. Nexium  40 mg - 1 tablet 2 times per day C. Famotidine  40 mg - 1 tablet in evening D. Replace all throat clearing with drinking/swallowing maneuver E. Obtain biopsy of esophagus with GI F.  Evaluation of throat with ENT  4.  If needed:  A. AIRSUPRA  - 2 inhalations or nebulizer every 4-6 hours B. Cetirizine  10 mg - 1 tablet 1 time per day C. Epi-Pen, benadryl , MD/ER evaluation for allergic reaction  5.  Follow up in 4 weeks or sooner if needed  6. Influenza = Tamiflu. Covid = Paxlovid  Camber has a problem with her throat and it is difficult to say exactly what is contributing to that issue.  It may be her Breztri , it may be her horrendous reflux, there may be redevelopment of vocal cord nodules.  I am going to refer her to ENT to help work through this issue.  We will change her Breztri  to Symbicort .  She also has a problem with reflux and a problem with swallowing and a problem with esophageal obstruction.  She will follow-up with GI regarding this issue.  She may have a component of eosinophilic esophagitis and a biopsy of her esophagus will tell us  if that is the case.  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  Okay to see her back in his clinic in 4 weeks.  Camellia Denis, MD Allergy  / Immunology [facility]

## 2024-07-13 NOTE — Telephone Encounter (Signed)
 Attempted to reach [employer] at a contact number below several times. Unable to speak to a representative.

## 2024-07-13 NOTE — Patient Instructions (Addendum)
  1.  Allergen avoidance measures - dust mite, pollens, egg  2.  Treat and prevent inflammation:  A. Change Breztri  to Symbicort  160 - 2 inhalations 2 times per day w/spacer  B. Flonase  - 1 spray each nostril 1-2 times per day  3.  Treat and prevent reflux / LPR:  A. Minimize caffeine consumption B. Nexium  40 mg - 1 tablet 2 times per day C. Famotidine  40 mg - 1 tablet in evening D. Replace all throat clearing with drinking/swallowing maneuver E. Obtain biopsy of esophagus with GI F.  Evaluation of throat with ENT  4.  If needed:  A. AIRSUPRA  - 2 inhalations or nebulizer every 4-6 hours B. Cetirizine  10 mg - 1 tablet 1 time per day C. Epi-Pen, benadryl , MD/ER evaluation for allergic reaction  5.  Follow up in 4 weeks or sooner if needed  6. Influenza = Tamiflu. Covid = Paxlovid

## 2024-07-14 ENCOUNTER — Encounter: Payer: Self-pay | Admitting: Allergy and Immunology

## 2024-07-16 ENCOUNTER — Encounter: Payer: Self-pay | Admitting: Family Medicine

## 2024-07-16 ENCOUNTER — Ambulatory Visit (INDEPENDENT_AMBULATORY_CARE_PROVIDER_SITE_OTHER): Admitting: Family Medicine

## 2024-07-16 ENCOUNTER — Ambulatory Visit: Payer: Self-pay

## 2024-07-16 VITALS — BP 164/100 | HR 63 | Temp 98.7°F | Ht 60.0 in | Wt 153.6 lb

## 2024-07-16 DIAGNOSIS — R0981 Nasal congestion: Secondary | ICD-10-CM

## 2024-07-16 DIAGNOSIS — J029 Acute pharyngitis, unspecified: Secondary | ICD-10-CM | POA: Diagnosis not present

## 2024-07-16 DIAGNOSIS — H66002 Acute suppurative otitis media without spontaneous rupture of ear drum, left ear: Secondary | ICD-10-CM | POA: Diagnosis not present

## 2024-07-16 DIAGNOSIS — J069 Acute upper respiratory infection, unspecified: Secondary | ICD-10-CM

## 2024-07-16 DIAGNOSIS — R051 Acute cough: Secondary | ICD-10-CM | POA: Diagnosis not present

## 2024-07-16 DIAGNOSIS — I10 Essential (primary) hypertension: Secondary | ICD-10-CM

## 2024-07-16 LAB — POCT INFLUENZA A/B
Influenza A, POC: NEGATIVE
Influenza B, POC: NEGATIVE

## 2024-07-16 LAB — POC COVID19 BINAXNOW: SARS Coronavirus 2 Ag: NEGATIVE

## 2024-07-16 LAB — POCT RAPID STREP A (OFFICE): Rapid Strep A Screen: NEGATIVE

## 2024-07-16 MED ORDER — AMOXICILLIN-POT CLAVULANATE 500-125 MG PO TABS: 1.0000 | ORAL_TABLET | Freq: Two times a day (BID) | ORAL | 0 refills | Status: AC

## 2024-07-16 NOTE — Progress Notes (Signed)
 Established Patient Office Visit   Subjective  Patient ID: [name], female    DOB: 1966/10/20  Age: 58 y.o. MRN: [id number]  Chief Complaint  Patient presents with   Acute Visit    Pt is a 58 yo female followed by Dr. Charlett and seen for acute concern.  Pt with sore throat, chills, cough, neck pain, nasal congestion x 2 days.  L ear feel like she is underwater.   Also feels like she is making a whistle sound, but not wheezing.  Tried tylenol .  Denies sick contacts, fever, diarrhea, constipation, HA.   BP elevated.  States was out of hctz x 1 wk.    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    Allergy     Anemia    Arthritis    Asthma    GERD (gastroesophageal reflux disease)    Heart murmur    Hypertension    Positive TB test    Shingles    TOBACCO USE 03/22/2009   Qualifier: Diagnosis of  By: Charlett MD, Apolinar POUR  Stopped Dec 12 2010     Past Surgical History:  Procedure Laterality Date   ABDOMINAL HYSTERECTOMY     BREAST SURGERY     cyst from left breast   BUNIONECTOMY     both feet   CESAREAN SECTION     CHOLECYSTECTOMY     CYSTECTOMY     left shoulder   KNEE SURGERY Left    PANENDOSCOPY     TONSILLECTOMY     TUBAL LIGATION     uterine ablastion     and polyps removed   Uterine polyps     Social History   Tobacco Use   Smoking status: Former    Current packs/day: 0.00    Average packs/day: 0.5 packs/day for 17.0 years (8.5 ttl pk-yrs)    Types: Cigarettes    Start date: 12/12/1993    Quit date: 12/12/2010    Years since quitting:  13.6   Smokeless tobacco: Never  Vaping Use   Vaping status: Never Used  Substance Use Topics   Alcohol use: No   Drug use: No   Family History  Problem Relation Age of Onset   Hypertension Mother    Alzheimer's disease Mother    Stroke Mother    Hypertension Father    Prostate cancer Father    Cancer Father    HIV Brother    Lupus Sister    Colon cancer Paternal Grandmother    Esophageal cancer Neg Hx    Liver cancer Neg Hx    Pancreatic cancer Neg Hx    Rectal cancer Neg Hx    Stomach cancer Neg Hx    Allergies  Allergen Reactions   Purified Water [Water, Sterile] Anaphylaxis   Allegra [Fexofenadine] Hives   Chocolate Other (See Comments)    triggers Asthma   Cimetidine Hives   Citric Acid Other (See Comments)    REACTION: swelling, hives, itching   Coly-Mycin S Other (See Comments)    REACTION:  pt reports she passed out   Diovan  Hct [Valsartan -Hydrochlorothiazide ] Swelling    Had facial swelling and throat itching after 2 doses of the generic had done well on the brand medicine for a while.  See ED visit February 2014   Amlodipine  Other (See Comments)    At 5 mg dose; swelling    ROS Negative unless stated above    Objective:     BP (!) 164/100 (BP Location: Left Arm, Patient Position: Sitting, Cuff Size: Normal)   Pulse 63   Temp 98.7 F (37.1 C) (Oral)   Ht 5' (1.524 m)   Wt 153 lb 9.6 oz (69.7 kg)   SpO2 99%   BMI 30.00 kg/m  BP Readings from Last 3 Encounters:  07/16/24 (!) 164/100  07/13/24 (!) 150/82  03/22/24 (!) 153/95   Wt Readings from Last 3 Encounters:  07/16/24 153 lb 9.6 oz (69.7 kg)  07/13/24 155 lb 1.6 oz (70.4 kg)  03/22/24 152 lb (68.9 kg)      Physical Exam Constitutional:      General: She is not in acute distress.    Appearance: Normal appearance.  HENT:     Head: Normocephalic and atraumatic.     Right Ear: Tympanic membrane, ear canal and external ear normal.     Left Ear: Ear canal and external ear normal.  Tympanic membrane is erythematous and bulging.     Nose: Nose normal.     Mouth/Throat:     Mouth: Mucous membranes are moist.  Cardiovascular:     Rate and Rhythm: Normal rate and regular rhythm.     Heart sounds: Normal heart sounds. No murmur heard.    No gallop.  Pulmonary:     Effort: Pulmonary effort is normal. No respiratory distress.     Breath sounds: Normal breath sounds. No wheezing, rhonchi or rales.  Skin:    General: Skin is warm and dry.  Neurological:     Mental Status: She is alert and oriented to person, place, and time.        07/01/2023    8:38 AM 05/14/2022    3:35 PM 05/01/2022    2:59 PM  Depression screen PHQ 2/9  Decreased Interest 0 0 0  Down, Depressed, Hopeless 0 0 0  PHQ - 2 Score 0 0 0  Altered sleeping  0 0  Tired, decreased energy  0 0  Change in appetite  0 0  Feeling bad or failure about yourself   0 0  Trouble concentrating  0 0  Moving slowly or fidgety/restless  0 0  Suicidal thoughts  0 0  PHQ-9 Score  0 0  Difficult doing work/chores  Not difficult at all Not difficult at all       No data to display           Results for orders placed or performed in visit on 07/16/24  POC COVID-19 BinaxNow  Result Value Ref Range   SARS Coronavirus 2 Ag Negative Negative  POCT rapid strep A  Result Value Ref Range   Rapid Strep A Screen Negative Negative  POC Influenza A/B  Result Value Ref Range   Influenza A, POC Negative Negative   Influenza B, POC Negative Negative      Assessment & Plan:   Acute suppurative otitis media of left ear without spontaneous rupture of tympanic membrane, recurrence not specified -     Amoxicillin -Pot Clavulanate; Take 1 tablet by mouth in the morning and at  bedtime for 7 days.  Dispense: 14 tablet; Refill: 0  Acute cough -     POC COVID-19 BinaxNow -     POCT rapid strep A -     POCT Influenza A/B  Sinus congestion -     POC COVID-19 BinaxNow -     POCT rapid strep A -     POCT Influenza  A/B  Sore throat -     POC COVID-19 BinaxNow -     POCT rapid strep A -     POCT Influenza A/B  Viral URI  Essential hypertension   Acute URI symptoms.  POC covid, strep, and flu testing negative.  Start abx for L AOM.  Supportive care for viral sx including rest, hydration, etc.  Advised to use OTC meds for ppl with HTN such as Coricidin HBP or Mucinex HBP.  Restart hydrochlorothiazide .    Return if symptoms worsen or fail to improve.   Clotilda JONELLE Single, MD

## 2024-07-16 NOTE — Telephone Encounter (Signed)
 Noted

## 2024-07-16 NOTE — Telephone Encounter (Signed)
 FYI Only or Action Required?: FYI only for provider.  Patient was last seen in primary care on 06/15/2024 by [name], Apolinar POUR, MD.  Called Nurse Triage reporting Cough.  Symptoms began several days ago.  Interventions attempted: Nothing.  Symptoms are: unchanged.  Triage Disposition: See HCP Within 4 Hours (Or PCP Triage)  Patient/caregiver understands and will follow disposition?: Yes     Copied from CRM #[id number]. Topic: Clinical - Red Word Triage >> Jul 16, 2024  8:19 AM Willma R wrote: [facility] that prompted transfer to Nurse Triage: Patient has a productive cough, head feels like she is under water and a very sore throat. Reason for Disposition  [1] MILD difficulty breathing (e.g., minimal/no SOB at rest, SOB with walking, pulse < 100) AND [2] still present when not coughing  Answer Assessment - Initial Assessment Questions 1. ONSET: When did the cough begin?      Wednesday and throat is very sore 2. SEVERITY: How bad is the cough today?      mild 3. SPUTUM: Describe the color of your sputum (e.g., none, dry cough; clear, white, yellow, green)     Brownish with green 4. HEMOPTYSIS: Are you coughing up any blood? If Yes, ask: How much? (e.g., flecks, streaks, tablespoons, etc.)     denies 5. DIFFICULTY BREATHING: Are you having difficulty breathing? If Yes, ask: How bad is it? (e.g., mild, moderate, severe)      Sore throat severe, chills, denies sob 6. FEVER: Do you have a fever? If Yes, ask: What is your temperature, how was it measured, and when did it start?     denies 7. CARDIAC HISTORY: Do you have any history of heart disease? (e.g., heart attack, congestive heart failure)      Heart murmur 8. LUNG HISTORY: Do you have any history of lung disease?  (e.g., pulmonary embolus, asthma, emphysema)     asthma 9. PE RISK FACTORS: Do you have a history of blood clots? (or: recent major surgery, recent prolonged travel, bedridden)     denies 10.  OTHER SYMPTOMS: Do you have any other symptoms? (e.g., runny nose, wheezing, chest pain)       Sore throat 11. PREGNANCY: Is there any chance you are pregnant? When was your last menstrual period?       na 12. TRAVEL: Have you traveled out of the country in the last month? (e.g., travel history, exposures)       no  Protocols used: Cough - Acute Productive-A-AH

## 2024-07-19 ENCOUNTER — Telehealth: Payer: Self-pay

## 2024-07-19 NOTE — Telephone Encounter (Signed)
 Copied from CRM [id number]. Topic: Clinical - Medical Advice >> Jul 19, 2024  2:53 PM Chasity T wrote: Reason for CRM: Patient is wanting to know the name of the genetic testing getting done to see if her insurance is going to pay for it. Please contact her back with information.

## 2024-07-20 NOTE — Telephone Encounter (Signed)
 Spoke to pt. Reports to her the diagnoses per Dr. Charlett from note 07/08/2024. Stated that pt has strong family history of cancer and alzheimer. Given pt the diagnose, also code. Z80.9- family hx of cancer; Z72.89- family hx of Alzheimer's disease.   Pt requests for us  to contact her insurance. Inform her we did try several times previously with no success to speak with representative. Advise to call them and this cma can try again. Pt verbalized understanding.   Attempted to reach pt' insurance. No success.

## 2024-07-28 ENCOUNTER — Encounter: Payer: Self-pay | Admitting: Cardiology

## 2024-07-28 ENCOUNTER — Ambulatory Visit: Attending: Cardiology | Admitting: Cardiology

## 2024-07-28 VITALS — BP 128/86 | HR 68 | Ht 60.0 in | Wt 148.4 lb

## 2024-07-28 DIAGNOSIS — R011 Cardiac murmur, unspecified: Secondary | ICD-10-CM | POA: Diagnosis not present

## 2024-07-28 DIAGNOSIS — J4520 Mild intermittent asthma, uncomplicated: Secondary | ICD-10-CM

## 2024-07-28 DIAGNOSIS — R079 Chest pain, unspecified: Secondary | ICD-10-CM

## 2024-07-28 NOTE — Patient Instructions (Signed)
 Medication Instructions:  The current medical regimen is effective;  continue present plan and medications.  *If you need a refill on your cardiac medications before your next appointment, please call your pharmacy*  Testing/Procedures: Your physician has requested that you have an echocardiogram. Echocardiography is a painless test that uses sound waves to create images of your heart. It provides your doctor with information about the size and shape of your heart and how well your heart's chambers and valves are working. This procedure takes approximately one hour. There are no restrictions for this procedure. Please do NOT wear cologne, perfume, aftershave, or lotions (deodorant is allowed). Please arrive 15 minutes prior to your appointment time.  Please note: We ask at that you not bring children with you during ultrasound (echo/ vascular) testing. Due to room size and safety concerns, children are not allowed in the ultrasound rooms during exams. Our front office staff cannot provide observation of children in our lobby area while testing is being conducted. An adult accompanying a patient to their appointment will only be allowed in the ultrasound room at the discretion of the ultrasound technician under special circumstances. We apologize for any inconvenience.   Follow-Up: At [facility], you and your health needs are our priority.  As part of our continuing mission to provide you with exceptional heart care, our providers are all part of one team.  This team includes your primary Cardiologist (physician) and Advanced Practice Providers or APPs (Physician Assistants and Nurse Practitioners) who all work together to provide you with the care you need, when you need it.  Your next appointment:   1 year(s)  Provider:   Dr Donato Schultz     We recommend signing up for the patient portal called "MyChart".  Sign up information is provided on this After Visit Summary.  MyChart is used  to connect with patients for Virtual Visits (Telemedicine).  Patients are able to view lab/test results, encounter notes, upcoming appointments, etc.  Non-urgent messages can be sent to your provider as well.   To learn more about what you can do with MyChart, go to [email or web address].

## 2024-07-28 NOTE — Progress Notes (Signed)
 Cardiology Office Note:  .   Date:  07/28/2024  ID:  [name], DOB 1965/12/02, MRN [id number] PCP: Charlett Apolinar POUR, MD  [facility] Providers Cardiologist:  Oneil Parchment, MD    History of Present Illness: [name] is a 58 y.o. female Discussed the use of AI scribe software   History of Present Illness [name] is a 58 year old female who presents for a cardiovascular follow-up.  She has not experienced any recent episodes of chest pressure or discomfort since her last evaluation. A coronary CT in 2019 showed no evidence of coronary disease or calcification, and a CT in 2022 was negative for pulmonary embolism. She recalls an emergency room visit a few years ago for chest pressure and anxiety, where troponin levels were normal.  She has a history of hyperlipidemia and is currently taking hydrochlorothiazide  25 mg daily and valsartan  160 mg daily (half a tablet of the 360 mg).  She experiences asthma symptoms that fluctuate with seasonal changes and allergens. She confirms using medications to manage her asthma.  In her family history, her grandfather had heart problems, but neither of her parents did. She has a significant smoking history, having started at age 44, quit at 14, and then resumed for about 12 years after her parents passed away. She quit smoking again on December 13, 2010.  No current chest pressure, discomfort, or breathing difficulties.     Studies Reviewed: Jordan   EKG Interpretation Date/Time:  Wednesday July 28 2024 08:48:01 EDT Ventricular Rate:  68 PR Interval:  154 QRS Duration:  82 QT Interval:  428 QTC Calculation: 455 R Axis:   -16  Text Interpretation: Normal sinus rhythm Right atrial enlargement Minimal voltage criteria for LVH, may be normal variant ( R in aVL ) When compared with ECG of 18-Aug-2023 11:26, No significant change since last tracing Confirmed by Parchment Oneil ([id number]) on 07/28/2024 9:02:55 AM     Results LABS Hb: 12.5 Cr: 1.1 K: 3.5  RADIOLOGY Coronary CT: No evidence of coronary disease or coronary calcification (09/16/2018) CT: Negative for pulmonary embolism (2022)  DIAGNOSTIC Echocardiogram: Trivial mitral regurgitation, increased wall thickness (2019) ECG: Normal Risk Assessment/Calculations:            Physical Exam:   VS:  BP 128/86   Pulse 68   Ht 5' (1.524 m)   Wt 148 lb 6.4 oz (67.3 kg)   SpO2 98%   BMI 28.98 kg/m    Wt Readings from Last 3 Encounters:  07/28/24 148 lb 6.4 oz (67.3 kg)  07/16/24 153 lb 9.6 oz (69.7 kg)  07/13/24 155 lb 1.6 oz (70.4 kg)    GEN: Well nourished, well developed in no acute distress NECK: No JVD; No carotid bruits CARDIAC: RRR, 2/6 systolic murmur, no rubs, no gallops RESPIRATORY:  Clear to auscultation without rales, wheezing or rhonchi  ABDOMEN: Soft, non-tender, non-distended EXTREMITIES:  No edema; No deformity   ASSESSMENT AND PLAN: .    Assessment and Plan Assessment & Plan Cardiac murmur with left ventricular hypertrophy and trivial mitral regurgitation Previous echocardiogram in 2019 showed trivial mitral regurgitation and moderately increased wall thickness. Current blood pressure is well-controlled with hydrochlorothiazide  and valsartan . No new symptoms reported. EKG is normal. - Order echocardiogram to assess heart valves and ventricular wall thickness  Essential hypertension Essential hypertension is well-controlled with current medication regimen, including hydrochlorothiazide  and valsartan . Blood pressure readings are within target range.  Hyperlipidemia Hyperlipidemia is managed  with current medication regimen.  Asthma Asthma is variable, with exacerbations related to seasonal changes and allergens. No acute exacerbations reported. Smoking history noted, but she has been abstinent since 2012, which is beneficial for asthma management.   Follow-up Plan for follow-up to monitor cardiac status and  overall health. - Schedule follow-up appointment in one year          Signed, Oneil Parchment, MD

## 2024-08-03 ENCOUNTER — Other Ambulatory Visit (HOSPITAL_COMMUNITY): Payer: Self-pay

## 2024-08-03 ENCOUNTER — Ambulatory Visit: Admitting: Gastroenterology

## 2024-08-03 ENCOUNTER — Encounter: Payer: Self-pay | Admitting: Gastroenterology

## 2024-08-03 VITALS — BP 112/72 | HR 77 | Ht 60.0 in | Wt 151.4 lb

## 2024-08-03 DIAGNOSIS — R1310 Dysphagia, unspecified: Secondary | ICD-10-CM

## 2024-08-03 DIAGNOSIS — K219 Gastro-esophageal reflux disease without esophagitis: Secondary | ICD-10-CM

## 2024-08-03 DIAGNOSIS — F4320 Adjustment disorder, unspecified: Secondary | ICD-10-CM | POA: Diagnosis not present

## 2024-08-03 DIAGNOSIS — Z860101 Personal history of adenomatous and serrated colon polyps: Secondary | ICD-10-CM

## 2024-08-03 DIAGNOSIS — K449 Diaphragmatic hernia without obstruction or gangrene: Secondary | ICD-10-CM

## 2024-08-03 DIAGNOSIS — R1319 Other dysphagia: Secondary | ICD-10-CM

## 2024-08-03 MED ORDER — SUCRALFATE 1 G PO TABS
1.0000 g | ORAL_TABLET | Freq: Two times a day (BID) | ORAL | 2 refills | Status: AC
  Filled 2024-08-03 – 2024-09-13 (×2): qty 60, 30d supply, fill #0

## 2024-08-03 NOTE — Progress Notes (Unsigned)
 GASTROENTEROLOGY OUTPATIENT CLINIC VISIT   Primary Care Provider [name], Apolinar POUR, MD [address] [place] [address] [phone]  Referring Provider [name], Apolinar POUR, MD [address] [place],  [place] [address] [phone]  Patient Profile: [name] is a 58 y.o. female with a pmh significant for  The patient presents to the [facility] for an evaluation and management of problem(s) noted below:  Problem List No diagnosis found.  History of Present Illness    The patient does/does not take NSAIDs or BC/Goody Powder. Patient has/has not had an EGD. Patient has/has not had a Colonoscopy.  GI Review of Systems Positive as above Negative for  Pyrosis; Reflux; Regurgitation; Dysphagia; Odynophagia; Globus; Post-prandial cough; Nocturnal cough; Nasal regurgitation; Epigastric pain; Nausea; Vomiting; Hematemesis; Jaundice; Change in Appetite; Early satiety; Abdominal pain; Abdominal bloating; Eructation; Flatulence; Change in BM Frequency; Change in BM Consistency; Constipation; Diarrhea; Incontinence; Urgency; Tenesmus; Hematochezia; Melena  Review of Systems General: Denies fevers/chills/weight loss unintentionally Cardiovascular: Denies chest pain Pulmonary: Denies shortness of breath Gastroenterological: See HPI Genitourinary: Denies darkened urine Hematological: Denies easy bruising/bleeding Endocrine: Denies temperature intolerance Dermatological: Denies jaundice Psychological: Mood is stable  Medications Current Outpatient Medications  Medication Sig Dispense Refill   albuterol  (PROVENTIL ) (2.5 MG/3ML) 0.083% nebulizer solution Inhale 3 mLs (2.5 mg total) by nebulization every 6 (six) hours as needed for wheezing or shortness of breath. 125 mL 12   Albuterol -Budesonide  (AIRSUPRA ) 90-80 MCG/ACT AERO Inhale 2 Inhalations into the lungs every 4 (four) hours as needed. 11 g 1   azelastine  (ASTELIN ) 0.1 % nasal spray Place  1 spray into both nostrils 2 (two) times daily as needed. Use in each nostril as directed 30 mL 5   budesonide -formoterol  (SYMBICORT ) 160-4.5 MCG/ACT inhaler Inhale 2 puffs into the lungs 2 (two) times daily. 1 each 12   cetirizine  (ZYRTEC ) 10 MG tablet Take 1 tablet (10 mg total) by mouth daily as needed for allergies (Can take an extra dose during flare ups.). 180 tablet 1   Cyanocobalamin (VITAMIN B-12 PO) Take by mouth daily.     EPINEPHrine  0.3 mg/0.3 mL IJ SOAJ injection Inject 0.3 mg into the muscle as needed for anaphylaxis. 2 each 1   esomeprazole  (NEXIUM ) 40 MG capsule Take 1 capsule (40 mg total) by mouth in the morning and at bedtime. 180 capsule 1   famotidine  (PEPCID ) 40 MG tablet Take 1 tablet (40 mg total) by mouth at bedtime. 90 tablet 1   fluticasone  (FLONASE ) 50 MCG/ACT nasal spray Place 1 spray into both nostrils in the morning and at bedtime. 48 g 1   hydrochlorothiazide  (HYDRODIURIL ) 25 MG tablet TAKE 1 TABLET (25 MG TOTAL) BY MOUTH DAILY. 90 tablet 0   ondansetron  (ZOFRAN -ODT) 4 MG disintegrating tablet Take 1 tablet (4 mg total) by mouth every 8 (eight) hours as needed for nausea or vomiting. 20 tablet 0   Probiotic Product (PROBIOTIC DAILY PO) Take by mouth.     Respiratory Therapy Supplies (NEBULIZER MASK ADULT) MISC 1 kit by Does not apply route as directed. 1 each 1   Spacer/Aero-Holding Chambers DEVI 1 Device by Does not apply route as directed. 1 each 1   sucralfate  (CARAFATE ) 1 g tablet Take 1 g by mouth 4 (four) times daily.     valsartan  (DIOVAN ) 320 MG tablet TAKE 0.5 TABLETS (160 MG TOTAL) BY MOUTH DAILY. 45 tablet 1   VITAMIN D PO Take by mouth.     BREZTRI  AEROSPHERE 160-9-4.8 MCG/ACT AERO  Inhale 2 puffs into the lungs in the morning and at bedtime. (Patient not taking: Reported on 08/03/2024) 32.1 g 1   Current Facility-Administered Medications  Medication Dose Route Frequency Provider Last Rate Last Admin   tezepelumab -ekko (TEZSPIRE ) 210 MG/1. syringe  210 mg  210 mg Subcutaneous Q28 days Kozlow, Eric J, MD   210 mg at 11/10/23 1455    Allergies Allergies  Allergen Reactions   Purified Water [Water, Sterile] Anaphylaxis   Allegra [Fexofenadine] Hives   Chocolate Other (See Comments)    triggers Asthma   Cimetidine Hives   Citric Acid Other (See Comments)    REACTION: swelling, hives, itching   Coly-Mycin S Other (See Comments)    REACTION: pt reports she passed out   Diovan  Hct [Valsartan -Hydrochlorothiazide ] Swelling    Had facial swelling and throat itching after 2 doses of the generic had done well on the brand medicine for a while.  See ED visit February 2014   Amlodipine  Other (See Comments)    At 5 mg dose; swelling    Histories Past Medical History:  Diagnosis Date   Allergic reaction caused by a drug 01/25/2013   probably.    Allergy     Anemia    Arthritis    Asthma    GERD (gastroesophageal reflux disease)    Heart murmur    Hypertension    Positive TB test    Shingles    TOBACCO USE 03/22/2009   Qualifier: Diagnosis of  By: Charlett MD, Apolinar POUR  Stopped Dec 12 2010     Past Surgical History:  Procedure Laterality Date   ABDOMINAL HYSTERECTOMY     BREAST SURGERY     cyst from left breast   BUNIONECTOMY     both feet   CESAREAN SECTION     CHOLECYSTECTOMY     CYSTECTOMY     left shoulder   KNEE SURGERY Left    PANENDOSCOPY     TONSILLECTOMY     TUBAL LIGATION     uterine ablastion     and polyps removed   Uterine polyps     Social History   Socioeconomic History   Marital status: Divorced    Spouse name: Not on file   Number of children: Not on file   Years of education: Not on file   Highest education level: Not on file  Occupational History   Not on file  Tobacco Use   Smoking status: Former    Current packs/day: 0.00    Average packs/day: 0.5 packs/day for 17.0 years (8.5 ttl pk-yrs)    Types: Cigarettes    Start date: 12/12/1993    Quit date: 12/12/2010    Years since quitting: 13.6    Smokeless tobacco: Never  Vaping Use   Vaping status: Never Used  Substance and Sexual Activity   Alcohol use: No   Drug use: No   Sexual activity: Not on file  Other Topics Concern   Not on file  Social History Narrative   Divorced    Tobacco since age 21 stopped feb 2012   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, 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    Social Drivers of Corporate investment banker Strain: Not on [employer] Insecurity: Not on file  Transportation Needs: Not on file  Physical Activity: Not on file  Stress: Not on file  Social Connections: Not on file  Intimate Partner Violence: Not on file   Family  History  Problem Relation Age of Onset   Hypertension Mother    Alzheimer's disease Mother    Stroke Mother    Hypertension Father    Prostate cancer Father    Cancer Father    HIV Brother    Lupus Sister    Colon cancer Paternal Grandmother    Esophageal cancer Neg Hx    Liver cancer Neg Hx    Pancreatic cancer Neg Hx    Rectal cancer Neg Hx    Stomach cancer Neg Hx    I have reviewed her medical, social, and family history in detail and updated the electronic medical record as necessary.    PHYSICAL EXAMINATION  BP 112/72   Pulse 77   Ht 5' (1.524 m)   Wt 151 lb 6 oz (68.7 kg)   BMI 29.56 kg/m  Wt Readings from Last 3 Encounters:  08/03/24 151 lb 6 oz (68.7 kg)  07/28/24 148 lb 6.4 oz (67.3 kg)  07/16/24 153 lb 9.6 oz (69.7 kg)   GEN: NAD, appears stated age, doesn't appear chronically ill PSYCH: Cooperative, without pressured speech EYE: Conjunctivae pink, sclerae anicteric ENT: MMM CV: Nontachycardic RESP: No audible wheezing GI: NABS, soft, NT/ND, without rebound or guarding, no HSM appreciated GU: DRE shows MSK/EXT: No significant lower extremity edema SKIN: No jaundice, no spider angiomata NEURO:  Alert & Oriented x 3, no focal deficits, no evidence of asterixis   REVIEW OF DATA  I reviewed the following data at the  time of this encounter:  GI Procedures and Studies  ***  Laboratory Studies  ***  Imaging Studies  ***   ASSESSMENT  Ms. Gato is a 58 y.o. female.  The patient is seen today for evaluation and management of:  No diagnosis found.  ***   PLAN  There are no diagnoses linked to this encounter.   No orders of the defined types were placed in this encounter.   New Prescriptions   No medications on file   Modified Medications   No medications on file    Planned Follow Up No follow-ups on file.   Total Time in Face-to-Face and in Coordination of Care for patient including independent/personal interpretation/review of prior testing, medical history, examination, medication adjustment, communicating results with the patient directly, and documentation within the EHR is ***.   Aloha Finner, MD [facility] Office # [phone]

## 2024-08-03 NOTE — Patient Instructions (Signed)
 We have sent the following medications to your pharmacy for you to pick up at your convenience: Carafate     You will be due for a recall colonoscopy in 2026. We will send you a reminder in the mail when it gets closer to that time.   You have been scheduled for an endoscopy. Please follow written instructions given to you at your visit today.  If you use inhalers (even only as needed), please bring them with you on the day of your procedure.  If you take any of the following medications, they will need to be adjusted prior to your procedure:   DO NOT TAKE 7 DAYS PRIOR TO TEST- Trulicity (dulaglutide) Ozempic, Wegovy (semaglutide) Mounjaro (tirzepatide) Bydureon Bcise (exanatide extended release)  DO NOT TAKE 1 DAY PRIOR TO YOUR TEST Rybelsus (semaglutide) Adlyxin (lixisenatide) Victoza (liraglutide) Byetta (exanatide) ___________________________________________________________________________    If your blood pressure at your visit was 140/90 or greater, please contact your primary care physician to follow up on this.  _______________________________________________________  If you are age 24 or older, your body mass index should be between 23-30. Your Body mass index is 29.56 kg/m. If this is out of the aforementioned range listed, please consider follow up with your Primary Care Provider.  If you are age 55 or younger, your body mass index should be between 19-25. Your Body mass index is 29.56 kg/m. If this is out of the aformentioned range listed, please consider follow up with your Primary Care Provider.   ________________________________________________________  The [facility] providers would like to encourage you to use MYCHART to communicate with providers for non-urgent requests or questions.  Due to long hold times on the telephone, sending your provider a message by [facility] may be a faster and more efficient way to get a response.  Please allow 48 business hours for  a response.  Please remember that this is for non-urgent requests.  _______________________________________________________  [facility] is using a team-based approach to care.  Your team is made up of your doctor and two to three APPS. Our APPS (Nurse Practitioners and Physician Assistants) work with your physician to ensure care continuity for you. They are fully qualified to address your health concerns and develop a treatment plan. They communicate directly with your gastroenterologist to care for you. Seeing the Advanced Practice Practitioners on your physician's team can help you by facilitating care more promptly, often allowing for earlier appointments, access to diagnostic testing, procedures, and other specialty referrals.   Due to recent changes in healthcare laws, you may see the results of your imaging and laboratory studies on MyChart before your provider has had a chance to review them.  We understand that in some cases there may be results that are confusing or concerning to you. Not all laboratory results come back in the same time frame and the provider may be waiting for multiple results in order to interpret others.  Please give us  48 hours in order for your provider to thoroughly review all the results before contacting the office for clarification of your results.   Thank you for choosing me and [facility].  Dr. Wilhelmenia

## 2024-08-06 ENCOUNTER — Encounter: Payer: Self-pay | Admitting: Gastroenterology

## 2024-08-06 DIAGNOSIS — K449 Diaphragmatic hernia without obstruction or gangrene: Secondary | ICD-10-CM | POA: Insufficient documentation

## 2024-08-06 DIAGNOSIS — R1310 Dysphagia, unspecified: Secondary | ICD-10-CM | POA: Insufficient documentation

## 2024-08-06 DIAGNOSIS — Z860101 Personal history of adenomatous and serrated colon polyps: Secondary | ICD-10-CM | POA: Insufficient documentation

## 2024-08-08 ENCOUNTER — Emergency Department (HOSPITAL_BASED_OUTPATIENT_CLINIC_OR_DEPARTMENT_OTHER): Admitting: Radiology

## 2024-08-08 ENCOUNTER — Emergency Department (HOSPITAL_BASED_OUTPATIENT_CLINIC_OR_DEPARTMENT_OTHER)
Admission: EM | Admit: 2024-08-08 | Discharge: 2024-08-08 | Disposition: A | Discharge status: Home / Self Care | Attending: Emergency Medicine | Admitting: Emergency Medicine

## 2024-08-08 ENCOUNTER — Emergency Department (HOSPITAL_BASED_OUTPATIENT_CLINIC_OR_DEPARTMENT_OTHER)

## 2024-08-08 DIAGNOSIS — G709 Myoneural disorder, unspecified: Secondary | ICD-10-CM

## 2024-08-08 DIAGNOSIS — Z041 Encounter for examination and observation following transport accident: Secondary | ICD-10-CM | POA: Diagnosis not present

## 2024-08-08 DIAGNOSIS — M25511 Pain in right shoulder: Secondary | ICD-10-CM | POA: Diagnosis not present

## 2024-08-08 DIAGNOSIS — S199XXA Unspecified injury of neck, initial encounter: Secondary | ICD-10-CM | POA: Diagnosis not present

## 2024-08-08 DIAGNOSIS — I10 Essential (primary) hypertension: Secondary | ICD-10-CM | POA: Insufficient documentation

## 2024-08-08 DIAGNOSIS — M542 Cervicalgia: Secondary | ICD-10-CM | POA: Insufficient documentation

## 2024-08-08 DIAGNOSIS — M545 Low back pain, unspecified: Secondary | ICD-10-CM | POA: Diagnosis not present

## 2024-08-08 DIAGNOSIS — M4316 Spondylolisthesis, lumbar region: Secondary | ICD-10-CM | POA: Diagnosis not present

## 2024-08-08 DIAGNOSIS — S20219A Contusion of unspecified front wall of thorax, initial encounter: Secondary | ICD-10-CM | POA: Insufficient documentation

## 2024-08-08 DIAGNOSIS — R079 Chest pain, unspecified: Secondary | ICD-10-CM | POA: Diagnosis not present

## 2024-08-08 DIAGNOSIS — Y92410 Unspecified street and highway as the place of occurrence of the external cause: Secondary | ICD-10-CM | POA: Insufficient documentation

## 2024-08-08 DIAGNOSIS — M5136 Other intervertebral disc degeneration, lumbar region with discogenic back pain only: Secondary | ICD-10-CM | POA: Diagnosis not present

## 2024-08-08 DIAGNOSIS — M546 Pain in thoracic spine: Secondary | ICD-10-CM | POA: Diagnosis not present

## 2024-08-08 DIAGNOSIS — M48061 Spinal stenosis, lumbar region without neurogenic claudication: Secondary | ICD-10-CM | POA: Diagnosis not present

## 2024-08-08 DIAGNOSIS — S299XXA Unspecified injury of thorax, initial encounter: Secondary | ICD-10-CM | POA: Diagnosis not present

## 2024-08-08 DIAGNOSIS — M419 Scoliosis, unspecified: Secondary | ICD-10-CM | POA: Diagnosis not present

## 2024-08-08 HISTORY — DX: Myoneural disorder, unspecified: G70.9

## 2024-08-08 MED ORDER — OXYCODONE-ACETAMINOPHEN 5-325 MG PO TABS
1.0000 | ORAL_TABLET | Freq: Once | ORAL | Status: AC
  Administered 2024-08-08: 1 via ORAL
  Filled 2024-08-08: qty 1

## 2024-08-08 MED ORDER — CYCLOBENZAPRINE HCL 10 MG PO TABS: 10.0000 mg | ORAL_TABLET | Freq: Two times a day (BID) | ORAL | 0 refills | Status: DC | PRN

## 2024-08-08 MED ORDER — OXYCODONE-ACETAMINOPHEN 5-325 MG PO TABS: 1.0000 | ORAL_TABLET | Freq: Four times a day (QID) | ORAL | 0 refills | Status: DC | PRN

## 2024-08-08 MED ORDER — KETOROLAC TROMETHAMINE 30 MG/ML IJ SOLN
15.0000 mg | Freq: Once | INTRAMUSCULAR | Status: AC
  Administered 2024-08-08: 15 mg via INTRAMUSCULAR
  Filled 2024-08-08: qty 1

## 2024-08-08 MED ORDER — LIDOCAINE 5 % EX PTCH: 1.0000 | MEDICATED_PATCH | CUTANEOUS | 0 refills | Status: AC
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
## 2024-08-08 NOTE — ED Notes (Signed)
 Patient transported to CT

## 2024-08-10 ENCOUNTER — Ambulatory Visit: Admitting: Allergy and Immunology

## 2024-08-10 VITALS — BP 128/88 | HR 73 | Temp 98.1°F | Resp 16 | Ht 60.0 in | Wt 149.0 lb

## 2024-08-10 DIAGNOSIS — J3089 Other allergic rhinitis: Secondary | ICD-10-CM

## 2024-08-10 DIAGNOSIS — J383 Other diseases of vocal cords: Secondary | ICD-10-CM

## 2024-08-10 DIAGNOSIS — J4550 Severe persistent asthma, uncomplicated: Secondary | ICD-10-CM

## 2024-08-10 DIAGNOSIS — J301 Allergic rhinitis due to pollen: Secondary | ICD-10-CM

## 2024-08-10 DIAGNOSIS — T7819XD Other adverse food reactions, not elsewhere classified, subsequent encounter: Secondary | ICD-10-CM

## 2024-08-10 DIAGNOSIS — K219 Gastro-esophageal reflux disease without esophagitis: Secondary | ICD-10-CM

## 2024-08-10 DIAGNOSIS — T7819XA Other adverse food reactions, not elsewhere classified, initial encounter: Secondary | ICD-10-CM

## 2024-08-10 MED ORDER — FLUTICASONE PROPIONATE 50 MCG/ACT NA SUSP: 1.0000 | Freq: Two times a day (BID) | NASAL | 1 refills | Status: DC

## 2024-08-10 MED ORDER — FAMOTIDINE 40 MG PO TABS: 40.0000 mg | ORAL_TABLET | Freq: Every evening | ORAL | 1 refills | Status: DC

## 2024-08-10 MED ORDER — EPINEPHRINE 0.3 MG/0.3ML IJ SOAJ: 0.3000 mg | INTRAMUSCULAR | 1 refills | Status: AC | PRN

## 2024-08-10 MED ORDER — CETIRIZINE HCL 10 MG PO TABS: 10.0000 mg | ORAL_TABLET | Freq: Every day | ORAL | 1 refills | Status: DC | PRN

## 2024-08-10 MED ORDER — ESOMEPRAZOLE MAGNESIUM 40 MG PO CPDR: 40.0000 mg | DELAYED_RELEASE_CAPSULE | Freq: Two times a day (BID) | ORAL | 1 refills | Status: DC

## 2024-08-10 MED ORDER — AIRSUPRA 90-80 MCG/ACT IN AERO: 2.0000 | INHALATION_SPRAY | RESPIRATORY_TRACT | 1 refills | Status: DC | PRN

## 2024-08-10 MED ORDER — BUDESONIDE-FORMOTEROL FUMARATE 160-4.5 MCG/ACT IN AERO: 2.0000 | INHALATION_SPRAY | Freq: Two times a day (BID) | RESPIRATORY_TRACT | 12 refills | Status: DC

## 2024-08-10 MED ORDER — ALBUTEROL SULFATE (2.5 MG/3ML) 0.083% IN NEBU: 2.5000 mg | INHALATION_SOLUTION | Freq: Four times a day (QID) | RESPIRATORY_TRACT | 12 refills | Status: DC | PRN

## 2024-08-10 NOTE — Patient Instructions (Addendum)
  1.  Allergen avoidance measures - dust mite, pollens, egg  2.  Treat and prevent inflammation:  A. Symbicort  160 - 2 inhalations 2 times per day w/spacer  B. Flonase  - 1 spray each nostril 1-2 times per day  3.  Treat and prevent reflux / LPR:  A. Minimize caffeine consumption B. Nexium  40 mg - 1 tablet 2 times per day C. Famotidine  40 mg - 1 tablet in evening D. Replace all throat clearing with drinking/swallowing maneuver E. Obtain endoscopy and biopsy November 2025 F. Evaluation of throat with ENT  4.  If needed:  A. AIRSUPRA  - 2 inhalations or nebulizer every 4-6 hours B. Cetirizine  10 mg - 1 tablet 1 time per day C. Epi-Pen, benadryl , MD/ER evaluation for allergic reaction  5.  Follow up in 12 weeks or sooner if needed  6. Influenza = Tamiflu. Covid = Paxlovid

## 2024-08-10 NOTE — Progress Notes (Unsigned)
 [place] [facility] [place] [facility] [place] [facility] [place] [facility] [place]   Follow-up Note  Referring Provider: Charlett Apolinar POUR, MD Primary Provider: Charlett Apolinar POUR, MD Date of Office Visit: 08/10/2024  Subjective:   [name] (DOB: [birth year]) is a 58 y.o. female who [facility] in re-evaluation of the following:  HPI: [name] returns to this clinic in evaluation of asthma, allergic rhinitis, LPR, adverse reaction to egg consumption.  I last saw her in this clinic 13 July 2024.  During her last visit we changed her from Breztri  to Symbicort  as she was having a fair amount of raspy voice and that change actually did help her voice.  She is at least 50% better at this point in time.  Her asthma is under very good control and she does not need to use the short acting bronchodilator.  She has had very little issues with her upper airway at this point in time.  Her reflux is still a very active issue and she did visit with GI and she does have an appointment to have an endoscopy with a planned esophageal biopsy to rule out EOE.  She remains away from egg consumption other than baked egg products.  Allergies as of 08/10/2024       Reactions   Purified Water [water, Sterile] Anaphylaxis   Allegra [fexofenadine] Hives   Chocolate Other (See Comments)   triggers Asthma   Cimetidine Hives   Citric Acid Other (See Comments)   REACTION: swelling, hives, itching   Coly-mycin S Other (See Comments)   REACTION: pt reports she passed out   Diovan  Hct [valsartan -hydrochlorothiazide ] Swelling   Had facial swelling and throat itching after 2 doses of the generic had done well on the brand medicine for a while.  See ED visit February 2014   Amlodipine  Other (See Comments)   At 5 mg dose; swelling        Medication List    Airsupra  90-80 MCG/ACT Aero Generic drug: Albuterol -Budesonide  Inhale 2 Inhalations into the lungs every 4 (four) hours as  needed.   albuterol  (2.5 MG/3ML) 0.083% nebulizer solution Commonly known as: PROVENTIL  Inhale 3 mLs (2.5 mg total) by nebulization every 6 (six) hours as needed for wheezing or shortness of breath.   azelastine  0.1 % nasal spray Commonly known as: ASTELIN  Place 1 spray into both nostrils 2 (two) times daily as needed. Use in each nostril as directed   budesonide -formoterol  160-4.5 MCG/ACT inhaler Commonly known as: Symbicort  Inhale 2 puffs into the lungs 2 (two) times daily.   cetirizine  10 MG tablet Commonly known as: ZYRTEC  Take 1 tablet (10 mg total) by mouth daily as needed for allergies (Can take an extra dose during flare ups.).   cyclobenzaprine  10 MG tablet Commonly known as: FLEXERIL  Take 1 tablet (10 mg total) by mouth 2 (two) times daily as needed for muscle spasms.   EPINEPHrine  0.3 mg/0.3 mL Soaj injection Commonly known as: EPI-PEN Inject 0.3 mg into the muscle as needed for anaphylaxis.   esomeprazole  40 MG capsule Commonly known as: NEXIUM  Take 1 capsule (40 mg total) by mouth in the morning and at bedtime.   famotidine  40 MG tablet Commonly known as: PEPCID  Take 1 tablet (40 mg total) by mouth at bedtime.   fluticasone  50 MCG/ACT nasal spray Commonly known as: FLONASE  Place 1 spray into both nostrils in the morning and at bedtime.   hydrochlorothiazide  25 MG tablet Commonly known as: HYDRODIURIL   TAKE 1 TABLET (25 MG TOTAL) BY MOUTH DAILY.   lidocaine  5 % Commonly known as: Lidoderm  Place 1 patch onto the skin daily. Remove & Discard patch within 12 hours or as directed by MD   Nebulizer Mask Adult Misc 1 kit by Does not apply route as directed.   ondansetron  4 MG disintegrating tablet Commonly known as: ZOFRAN -ODT Take 1 tablet (4 mg total) by mouth every 8 (eight) hours as needed for nausea or vomiting.   oxyCODONE -acetaminophen  5-325 MG tablet Commonly known as: PERCOCET/ROXICET Take 1 tablet by mouth every 6 (six) hours as needed for severe  pain (pain score 7-10).   PROBIOTIC DAILY PO Take by mouth.   Spacer/Aero-Holding [employer] 1 Device by Does not apply route as directed.   sucralfate  1 g tablet Commonly known as: CARAFATE  Take 1 tablet (1 g total) by mouth 2 (two) times daily.   valsartan  320 MG tablet Commonly known as: DIOVAN  TAKE 0.5 TABLETS (160 MG TOTAL) BY MOUTH DAILY.   VITAMIN B-12 PO Take by mouth daily.   VITAMIN D PO Take by mouth.    Past Medical History:  Diagnosis Date   Allergic reaction caused by a drug 01/25/2013   probably.    Allergy     Anemia    Arthritis    Asthma    GERD (gastroesophageal reflux disease)    Heart murmur    Hypertension    Positive TB test    Shingles    TOBACCO USE 03/22/2009   Qualifier: Diagnosis of  By: Charlett MD, Apolinar POUR  Stopped Dec 12 2010      Past Surgical History:  Procedure Laterality Date   ABDOMINAL HYSTERECTOMY     BREAST SURGERY     cyst from left breast   BUNIONECTOMY     both feet   CESAREAN SECTION     CHOLECYSTECTOMY     CYSTECTOMY     left shoulder   KNEE SURGERY Left    PANENDOSCOPY     TONSILLECTOMY     TUBAL LIGATION     uterine ablastion     and polyps removed   Uterine polyps      Review of systems negative except as noted in HPI / PMHx or noted below:  Review of Systems  Constitutional: Negative.   HENT: Negative.    Eyes: Negative.   Respiratory: Negative.    Cardiovascular: Negative.   Gastrointestinal: Negative.   Genitourinary: Negative.   Musculoskeletal: Negative.   Skin: Negative.   Neurological: Negative.   Endo/Heme/Allergies: Negative.   Psychiatric/Behavioral: Negative.       Objective:   Vitals:   08/10/24 1614  BP: 128/88  Pulse: 73  Resp: 16  Temp: 98.1 F (36.7 C)  SpO2: 95%   Height: 5' (152.4 cm)  Weight: 149 lb (67.6 kg)   Physical Exam Constitutional:      Appearance: She is not diaphoretic.     Comments: Raspy voice   HENT:     Head: Normocephalic.     Right Ear:  Tympanic membrane, ear canal and external ear normal.     Left Ear: Tympanic membrane, ear canal and external ear normal.     Nose: Nose normal. No mucosal edema or rhinorrhea.     Mouth/Throat:     Pharynx: Uvula midline. No oropharyngeal exudate.  Eyes:     Conjunctiva/sclera: Conjunctivae normal.  Neck:     Thyroid: No thyromegaly.     Trachea: Trachea normal. No tracheal  tenderness or tracheal deviation.  Cardiovascular:     Rate and Rhythm: Normal rate and regular rhythm.     Heart sounds: Normal heart sounds, S1 normal and S2 normal. No murmur heard. Pulmonary:     Effort: No respiratory distress.     Breath sounds: Normal breath sounds. No stridor. No wheezing or rales.  Lymphadenopathy:     Head:     Right side of head: No tonsillar adenopathy.     Left side of head: No tonsillar adenopathy.     Cervical: No cervical adenopathy.  Skin:    Findings: No erythema or rash.     Nails: There is no clubbing.  Neurological:     Mental Status: She is alert.     Diagnostics:    Spirometry was performed and demonstrated an FEV1 of 1.06 at 55 % of predicted.  Assessment and Plan:   1. Severe persistent asthma without complication (HCC)   2. Asthma, severe persistent, well-controlled (HCC)   3. Perennial allergic rhinitis   4. Seasonal allergic rhinitis due to pollen   5. LPRD (laryngopharyngeal reflux disease)   6. Vocal cord dysfunction   7. Adverse food reaction, initial encounter     1.  Allergen avoidance measures - dust mite, pollens, egg  2.  Treat and prevent inflammation:  A. Symbicort  160 - 2 inhalations 2 times per day w/spacer  B. Flonase  - 1 spray each nostril 1-2 times per day  3.  Treat and prevent reflux / LPR:  A. Minimize caffeine consumption B. Nexium  40 mg - 1 tablet 2 times per day C. Famotidine  40 mg - 1 tablet in evening D. Replace all throat clearing with drinking/swallowing maneuver E. Obtain endoscopy and biopsy November 2025 F.  Evaluation of throat with ENT  4.  If needed:  A. AIRSUPRA  - 2 inhalations or nebulizer every 4-6 hours B. Cetirizine  10 mg - 1 tablet 1 time per day C. Epi-Pen, benadryl , MD/ER evaluation for allergic reaction  5.  Follow up in 12 weeks or sooner if needed  6. Influenza = Tamiflu. Covid = Paxlovid  Havyn is going to continue on anti-inflammatory agents for her airway which appears to be working pretty well regarding her asthma and allergic rhinitis.  Her reflux is still an active issue and she has a plan to obtain a esophageal biopsy to rule out EOE in November 2025 and she will continue on a proton pump inhibitor and H2 receptor blocker.  We do need to get her throat evaluated especially given her previous history of vocal cord polyps and we will see if we can get that arranged soon.  I will see her back in this clinic in 12 weeks or earlier if there is a problem.  Camellia Denis, MD Allergy  / Immunology [facility]

## 2024-08-11 ENCOUNTER — Encounter: Payer: Self-pay | Admitting: Internal Medicine

## 2024-08-11 ENCOUNTER — Telehealth: Payer: Self-pay | Admitting: Allergy and Immunology

## 2024-08-11 ENCOUNTER — Encounter: Payer: Self-pay | Admitting: Allergy and Immunology

## 2024-08-11 ENCOUNTER — Ambulatory Visit: Admitting: Internal Medicine

## 2024-08-11 ENCOUNTER — Telehealth: Payer: Self-pay | Admitting: Genetic Counselor

## 2024-08-11 VITALS — BP 140/80 | HR 57 | Temp 98.0°F | Wt 153.6 lb

## 2024-08-11 DIAGNOSIS — S161XXD Strain of muscle, fascia and tendon at neck level, subsequent encounter: Secondary | ICD-10-CM

## 2024-08-11 DIAGNOSIS — S39012S Strain of muscle, fascia and tendon of lower back, sequela: Secondary | ICD-10-CM

## 2024-08-11 DIAGNOSIS — S39012D Strain of muscle, fascia and tendon of lower back, subsequent encounter: Secondary | ICD-10-CM

## 2024-08-11 DIAGNOSIS — S161XXS Strain of muscle, fascia and tendon at neck level, sequela: Secondary | ICD-10-CM | POA: Diagnosis not present
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
## 2024-08-11 NOTE — Telephone Encounter (Signed)
 Briyonna has been internally referred to [facility].  They will reach out to the patient to schedule.  I will follow up in a week.

## 2024-08-11 NOTE — Telephone Encounter (Signed)
 Returning patient call, questions regarding appointment and test codes. LVM

## 2024-08-11 NOTE — Patient Instructions (Signed)
 Ok to take   muscle relaxant at night . Refer to PT.   Plan fu in 1 week  or as needed to decide on  work situation.

## 2024-08-12 NOTE — Telephone Encounter (Signed)
 Patient calling to provide us  with codes for her genetic counseling appointment - was told by someone she needed to obtain diagnosis codes. Provided z80.9 and z84.89.   We discussed that we don't typically need diagnostic codes prior to the appointment and that coverage for the genetic testing would be based on the specifics of her personal and family history. We would evaluate history and discuss expectations at time of appointment.   Provided with CPT code for genetic counseling 03958 to check with insurance on coverage for appointment.   Reviewed basics of what will happen at appointment, encouraged call back with any additional questions or concerns.

## 2024-08-16 ENCOUNTER — Ambulatory Visit: Attending: Internal Medicine

## 2024-08-16 ENCOUNTER — Other Ambulatory Visit: Payer: Self-pay

## 2024-08-16 DIAGNOSIS — R252 Cramp and spasm: Secondary | ICD-10-CM | POA: Diagnosis not present

## 2024-08-16 DIAGNOSIS — M546 Pain in thoracic spine: Secondary | ICD-10-CM | POA: Diagnosis not present

## 2024-08-16 DIAGNOSIS — S39012S Strain of muscle, fascia and tendon of lower back, sequela: Secondary | ICD-10-CM | POA: Diagnosis not present

## 2024-08-16 DIAGNOSIS — S161XXS Strain of muscle, fascia and tendon at neck level, sequela: Secondary | ICD-10-CM | POA: Insufficient documentation

## 2024-08-16 DIAGNOSIS — M5459 Other low back pain: Secondary | ICD-10-CM

## 2024-08-16 DIAGNOSIS — R293 Abnormal posture: Secondary | ICD-10-CM | POA: Insufficient documentation

## 2024-08-16 DIAGNOSIS — M542 Cervicalgia: Secondary | ICD-10-CM | POA: Insufficient documentation
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
## 2024-08-17 ENCOUNTER — Other Ambulatory Visit (HOSPITAL_COMMUNITY): Payer: Self-pay

## 2024-08-17 ENCOUNTER — Ambulatory Visit (INDEPENDENT_AMBULATORY_CARE_PROVIDER_SITE_OTHER): Admitting: Internal Medicine

## 2024-08-17 VITALS — BP 120/90 | HR 69 | Temp 98.0°F | Ht 60.0 in | Wt 152.0 lb

## 2024-08-17 DIAGNOSIS — S161XXD Strain of muscle, fascia and tendon at neck level, subsequent encounter: Secondary | ICD-10-CM

## 2024-08-17 DIAGNOSIS — F4320 Adjustment disorder, unspecified: Secondary | ICD-10-CM | POA: Diagnosis not present

## 2024-08-17 DIAGNOSIS — M79604 Pain in right leg: Secondary | ICD-10-CM

## 2024-08-17 DIAGNOSIS — S39012D Strain of muscle, fascia and tendon of lower back, subsequent encounter: Secondary | ICD-10-CM

## 2024-08-17 DIAGNOSIS — S39012S Strain of muscle, fascia and tendon of lower back, sequela: Secondary | ICD-10-CM | POA: Diagnosis not present

## 2024-08-17 DIAGNOSIS — M545 Low back pain, unspecified: Secondary | ICD-10-CM

## 2024-08-17 DIAGNOSIS — S161XXS Strain of muscle, fascia and tendon at neck level, sequela: Secondary | ICD-10-CM

## 2024-08-17 MED ORDER — METHOCARBAMOL 500 MG PO TABS
1000.0000 mg | ORAL_TABLET | Freq: Three times a day (TID) | ORAL | 1 refills | Status: AC | PRN
  Filled 2024-08-17: qty 40, 7d supply, fill #0
  Filled 2024-09-13 – 2024-09-15 (×2): qty 40, 7d supply, fill #1
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
## 2024-08-17 NOTE — Patient Instructions (Signed)
 Will do referral to dr Joane team.  I will fill out form for temporary out of work  For the next 2 weeks.  And then go from there  as advised by speciality  Avoid prolonged sitting  activity as tolerated .  Continue PT .   Try new muscle relaxant at night .  Or as needed

## 2024-08-19 ENCOUNTER — Encounter (INDEPENDENT_AMBULATORY_CARE_PROVIDER_SITE_OTHER): Payer: Self-pay

## 2024-08-19 ENCOUNTER — Ambulatory Visit

## 2024-08-19 DIAGNOSIS — R252 Cramp and spasm: Secondary | ICD-10-CM | POA: Diagnosis not present

## 2024-08-19 DIAGNOSIS — M5459 Other low back pain: Secondary | ICD-10-CM

## 2024-08-19 DIAGNOSIS — R293 Abnormal posture: Secondary | ICD-10-CM

## 2024-08-19 DIAGNOSIS — M542 Cervicalgia: Secondary | ICD-10-CM | POA: Diagnosis not present

## 2024-08-19 DIAGNOSIS — S161XXS Strain of muscle, fascia and tendon at neck level, sequela: Secondary | ICD-10-CM | POA: Diagnosis not present

## 2024-08-19 DIAGNOSIS — M546 Pain in thoracic spine: Secondary | ICD-10-CM | POA: Diagnosis not present

## 2024-08-19 DIAGNOSIS — S39012S Strain of muscle, fascia and tendon of lower back, sequela: Secondary | ICD-10-CM | POA: Diagnosis not present

## 2024-08-19 DIAGNOSIS — M6281 Muscle weakness (generalized): Secondary | ICD-10-CM

## 2024-08-19 DIAGNOSIS — R262 Difficulty in walking, not elsewhere classified: Secondary | ICD-10-CM
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
## 2024-08-23 ENCOUNTER — Ambulatory Visit

## 2024-08-24 ENCOUNTER — Ambulatory Visit: Admitting: Family Medicine

## 2024-08-24 ENCOUNTER — Other Ambulatory Visit: Payer: Self-pay

## 2024-08-24 ENCOUNTER — Encounter: Payer: Self-pay | Admitting: Family Medicine

## 2024-08-24 VITALS — BP 122/84 | HR 82 | Ht 60.0 in | Wt 152.0 lb

## 2024-08-24 DIAGNOSIS — R29898 Other symptoms and signs involving the musculoskeletal system: Secondary | ICD-10-CM

## 2024-08-24 DIAGNOSIS — M25511 Pain in right shoulder: Secondary | ICD-10-CM | POA: Diagnosis not present

## 2024-08-24 DIAGNOSIS — M545 Low back pain, unspecified: Secondary | ICD-10-CM | POA: Diagnosis not present
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
## 2024-08-24 NOTE — Patient Instructions (Addendum)
 Thank you for coming in today.   Continue Physical Therapy.  We are ordering an MRI for you today.  The imaging office will be calling you to schedule your appointment after we obtain authorization from your insurance company.  If you have not heard from [facility] within a week, please call [phone] to schedule your MRI.   Please be sure you have signed up for MyChart so that we can get your results to you.  We will be in touch with you as soon as we can.  Please know, it can take up to 3-4 business days for the radiologist and Dr. Joane to have time to review the results and determine the best plan of care.  If there is something that appears to be surgical or needs a referral to other specialists we will let you know through MyChart or telephone.  Otherwise we will plan to schedule a follow up appointment with Dr. Joane once we have the results.    See you back to review MRI results.

## 2024-08-25 ENCOUNTER — Ambulatory Visit

## 2024-08-25 ENCOUNTER — Encounter: Payer: Self-pay | Admitting: Family Medicine

## 2024-08-28 ENCOUNTER — Ambulatory Visit
Admission: RE | Admit: 2024-08-28 | Discharge: 2024-08-28 | Disposition: A | Discharge status: Home / Self Care | Source: Ambulatory Visit | Attending: Family Medicine | Admitting: Family Medicine

## 2024-08-28 DIAGNOSIS — R29898 Other symptoms and signs involving the musculoskeletal system: Secondary | ICD-10-CM

## 2024-08-28 DIAGNOSIS — M47817 Spondylosis without myelopathy or radiculopathy, lumbosacral region: Secondary | ICD-10-CM | POA: Diagnosis not present

## 2024-08-28 DIAGNOSIS — M4316 Spondylolisthesis, lumbar region: Secondary | ICD-10-CM | POA: Diagnosis not present

## 2024-08-28 DIAGNOSIS — M5127 Other intervertebral disc displacement, lumbosacral region: Secondary | ICD-10-CM | POA: Diagnosis not present

## 2024-08-30 ENCOUNTER — Inpatient Hospital Stay: Attending: Internal Medicine | Admitting: Genetic Counselor

## 2024-08-30 ENCOUNTER — Telehealth: Payer: Self-pay

## 2024-08-30 ENCOUNTER — Inpatient Hospital Stay

## 2024-08-30 ENCOUNTER — Encounter: Payer: Self-pay | Admitting: Genetic Counselor

## 2024-08-30 DIAGNOSIS — Z1379 Encounter for other screening for genetic and chromosomal anomalies: Secondary | ICD-10-CM | POA: Diagnosis not present

## 2024-08-30 DIAGNOSIS — Z800 Family history of malignant neoplasm of digestive organs: Secondary | ICD-10-CM | POA: Insufficient documentation

## 2024-08-30 DIAGNOSIS — Z8042 Family history of malignant neoplasm of prostate: Secondary | ICD-10-CM | POA: Insufficient documentation

## 2024-08-30 DIAGNOSIS — Z8041 Family history of malignant neoplasm of ovary: Secondary | ICD-10-CM

## 2024-08-30 LAB — GENETIC SCREENING ORDER

## 2024-08-30 NOTE — Telephone Encounter (Signed)
 Follow up with Christy. Confirm pt's name and date of birth. She states she received the authorization and will call pt sometimes this week to set up the appt.

## 2024-08-30 NOTE — Progress Notes (Signed)
 REFERRING PROVIDER: Charlett Apolinar POUR, MD [address] [place],  [place] [address]  PRIMARY PROVIDER:  Charlett Apolinar POUR, MD  PRIMARY REASON FOR VISIT:  1. Family history of malignant neoplasm of prostate   2. Family history of malignant neoplasm of ovary   3. Family history of malignant neoplasm of gastrointestinal tract    HISTORY OF PRESENT ILLNESS:   [name], a 58 y.o. female, was seen for a [place] cancer genetics consultation at the request of [name], Wanda K, MD due to a family history of cancer.  [name] presents to clinic today to discuss the possibility of a hereditary predisposition to cancer, to discuss genetic testing, and to further clarify her future cancer risks, as well as potential cancer risks for family members.     [name] is a 58 y.o. female with no personal history of cancer.    RELEVANT MEDICAL HISTORY:  Menarche was at age 10.  First live birth at age 54.  OCP use for approximately 2 years.  Ovaries intact: yes.  Uterus intact: no. Hysterectomy at age 35 Menopausal status: postmenopausal. Reports menopause around age [age] HRT use: 0 years. Colonoscopy: yes; 2019, two polyps removed (one adenomatous). Mammogram within the last year: yes. Density  C Number of breast biopsies: 0.   Past Medical History:  Diagnosis Date   Allergic reaction caused by a drug 01/25/2013   probably.    Allergy     Anemia    Arthritis    Asthma    GERD (gastroesophageal reflux disease)    Heart murmur    Hypertension    Positive TB test    Shingles    TOBACCO USE 03/22/2009   Qualifier: Diagnosis of  By: Charlett MD, Apolinar POUR  Stopped Dec 12 2010      Past Surgical History:  Procedure Laterality Date   ABDOMINAL HYSTERECTOMY     BREAST SURGERY     cyst from left breast   BUNIONECTOMY     both feet   CESAREAN SECTION     CHOLECYSTECTOMY     CYSTECTOMY     left shoulder   KNEE SURGERY Left    PANENDOSCOPY     TONSILLECTOMY     TUBAL LIGATION      uterine ablastion     and polyps removed   Uterine polyps      Social History   Socioeconomic History   Marital status: Divorced    Spouse name: Not on file   Number of children: Not on file   Years of education: Not on file   Highest education level: Associate degree: occupational, scientist, product/process development, or vocational program  Occupational History   Not on file  Tobacco Use   Smoking status: Former    Current packs/day: 0.00    Average packs/day: 0.5 packs/day for 17.0 years (8.5 ttl pk-yrs)    Types: Cigarettes    Start date: 12/12/1993    Quit date: 12/12/2010    Years since quitting: 13.7   Smokeless tobacco: Never  Vaping Use   Vaping status: Never Used  Substance and Sexual Activity   Alcohol use: No   Drug use: No   Sexual activity: Not on file  Other Topics Concern   Not on file  Social History Narrative   Divorced    Tobacco since age 11 stopped feb 2012   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[employer], [employer].Patient account REP   Now works [employer]    Social Drivers of  Health   Financial Resource Strain: Low Risk  (08/17/2024)   Overall Financial Resource Strain (CARDIA)    Difficulty of Paying Living Expenses: Not hard at all  Food Insecurity: No Food Insecurity (08/17/2024)   Hunger Vital Sign    Worried About Running Out of Food in the Last Year: Never true    Ran Out of Food in the Last Year: Never true  Transportation Needs: No Transportation Needs (08/17/2024)   PRAPARE - Administrator, Civil Service (Medical): No    Lack of Transportation (Non-Medical): No  Physical Activity: Inactive (08/17/2024)   Exercise Vital Sign    Days of Exercise per Week: 0 days    Minutes of Exercise per Session: Not on file  Stress: No Stress Concern Present (08/17/2024)   [employer] of Occupational Health - Occupational Stress Questionnaire    Feeling of Stress: Not at all  Social Connections: Moderately Integrated (08/17/2024)   Social Connection and  Isolation Panel    Frequency of Communication with Friends and Family: More than three times a week    Frequency of Social Gatherings with Friends and Family: Three times a week    Attends Religious Services: More than 4 times per year    Active Member of Clubs or Organizations: Yes    Attends Engineer, Structural: More than 4 times per year    Marital Status: Divorced     FAMILY HISTORY:  We obtained a detailed, 4-generation family history.  Significant diagnoses are listed below: Family History  Problem Relation Age of Onset   Hypertension Mother    Alzheimer's disease Mother    Stroke Mother    Hypertension Father    Prostate cancer Father 30       metastatic   Cancer Father    Lupus Sister    HIV Brother    Alzheimer's disease Maternal Aunt        four maternal aunts   Alzheimer's disease Maternal Uncle        three maternal uncles   Alzheimer's disease Maternal Uncle    Lung cancer Maternal Uncle    Ovarian cancer Paternal Aunt 43   Prostate cancer Paternal Uncle 60       metastatic   Stroke Maternal Grandmother    Stroke Maternal Grandfather    Colon cancer Paternal Grandmother 24   Lung cancer Paternal Grandfather        worked in tobacco mill   Rectal cancer Other 29       paternal great aunt   Lung cancer Other        paternal great aunt   Lung cancer Other        paternal great aunt   Esophageal cancer Neg Hx    Liver cancer Neg Hx    Pancreatic cancer Neg Hx    Stomach cancer Neg Hx    Inflammatory bowel disease Neg Hx    Liver disease Neg Hx     [name] is unaware of previous family history of genetic testing for hereditary cancer risks There is no reported Ashkenazi Jewish ancestry.     GENETIC COUNSELING ASSESSMENT: [name] is a 58 y.o. female with a family history of cancer which is somewhat suggestive of a hereditary predisposition to cancer given her paternal family history of a first and second degree relative with metastatic  prostate cancer and a second degree relative with ovarian cancer. We, therefore, discussed and recommended the following at today's visit.  DISCUSSION: We discussed that 5 - 10% of cancer is hereditary, with most cases of hereditary prostate and ovarian cancer associated with BRCA1/2 pathogenic variants.  There are other genes that can be associated with hereditary  cancer syndromes.  We discussed that testing is beneficial for several reasons, including knowing about other cancer risks, identifying potential screening and risk-reduction options that may be appropriate, and to understanding if other family members could be at risk for cancer and allowing them to undergo genetic testing.  We reviewed the characteristics, features and inheritance patterns of hereditary cancer syndromes. We also discussed genetic testing, including the appropriate family members to test, the process of testing, insurance coverage and turn-around-time for results. We discussed the implications of a negative, positive, carrier and/or variant of uncertain significant result. We discussed that negative results would be uninformative given that [name] does not have a personal history of cancer. We recommended [name] pursue genetic testing for a panel that contains genes associated with colorectal, ovarian and prostate cancers.  [name] was offered a common hereditary cancer panel (40+ genes) and an expanded pan-cancer panel (70+ genes). [name] was informed of the benefits and limitations of each panel, including that expanded pan-cancer panels contain several genes that do not have clear management guidelines at this point in time.  We also discussed that as the number of genes included on a panel increases, the chances of variants of uncertain significance increases.  After considering the benefits and limitations of each gene panel, [name] elected to have [facility]'s MultiCancer +RNA panel. This includes  analysis of the following genes: AIP, ALK, APC, ATM, AXIN2, BAP1, BARD1, BLM, BMPR1A, BRCA1, BRCA2, BRIP1, CDC73, CDH1, CDK4, CDKN1B, CDKN2A, CHEK2, CTNNA1, DICER1, EGFR, EPCAM, FH, FLCN, GREM1, HOXB13, KIT, LZTR1, MAX, MBD4, MEN1, MET, MITF, MLH1, MSH2, MSH6, MUTYH, NF1, NF2, NTHL1, PALB2, PDGFRA, PMS2, POLD1, POLE, POT1, PRKAR1A, PTCH1, PTEN, RAD51C, RAD51D, RB1, RET, SDHA, SDHAF2, SDHB, SDHC, SDHD, SMAD4, SMARCA4, SMARCB1, SMARCE1, STK11, SUFU, TMEM127, TP53, TSC1, TSC2, VHL.    Based on [name] family history of cancer, she meets medical criteria for genetic testing. Though [name] is not personally affected, there are no affected family members that are willing/able to undergo hereditary cancer testing. Despite that she meets criteria, she may still have an out of pocket cost. We discussed that if her out of pocket cost for testing is over $100, the laboratory should contact them to discuss self-pay prices, patient pay assistance programs, if applicable, and other billing options.  We discussed that some people do not want to undergo genetic testing due to fear of genetic discrimination.  A federal law called the Genetic Information Non-Discrimination Act (GINA) of 2008 helps protect individuals against genetic discrimination based on their genetic test results.  It impacts both health insurance and employment.  With health insurance, it protects against increased premiums, being kicked off insurance or being forced to take a test in order to be insured.  For employment it protects against hiring, firing and promoting decisions based on genetic test results.  GINA does not apply to those in the [employer], those who work for companies with less than 15 employees, and new life insurance or long-term disability insurance policies.  Health status due to a cancer diagnosis is not protected under GINA.  PLAN: After considering the risks, benefits, and limitations, [name] provided informed  consent to pursue genetic testing and the blood sample was sent to [facility] for analysis of the MultiCancer +  RNA panel. Results should be available within approximately 2-3 weeks' time, at which point they will be disclosed by telephone to [name], as will any additional recommendations warranted by these results. [name] will receive a summary of her genetic counseling visit and a copy of her results once available. This information will also be available in Epic.   Lastly, we encouraged [name] to remain in contact with cancer genetics annually so that we can continuously update the family history and inform her of any changes in cancer genetics and testing that may be of benefit for this family.   [name] questions were answered to her satisfaction today. Our contact information was provided should additional questions or concerns arise. Thank you for the referral and allowing us  to share in the care of your patient.   Burnard Ogren, MS, [facility] Licensed, [email or web address] phone: [phone]   60 minutes were spent on the date of the encounter in service to the patient including preparation, face-to-face consultation, documentation and care coordination.  The patient was seen alone.  Drs. Gudena and/or Lanny were available to discuss this case as needed.  _______________________________________________________________________ For Office Staff:  Number of people involved in session: 1 Was an Intern/ student involved with case: no

## 2024-08-30 NOTE — Telephone Encounter (Signed)
 Copied from CRM [id number]. Topic: General - Other >> Aug 27, 2024  9:08 AM Deaijah H wrote: Reason for CRM: Bari LIGHTER [facility] called in due to authorization still pending so patients appointment will be cancelled for tomorrow. If needed, please call [phone] ext [phone]

## 2024-08-31 ENCOUNTER — Encounter: Payer: Self-pay | Admitting: Internal Medicine

## 2024-08-31 ENCOUNTER — Ambulatory Visit: Admitting: Internal Medicine

## 2024-08-31 ENCOUNTER — Telehealth: Payer: Self-pay | Admitting: Family Medicine

## 2024-08-31 DIAGNOSIS — S161XXS Strain of muscle, fascia and tendon at neck level, sequela: Secondary | ICD-10-CM

## 2024-08-31 DIAGNOSIS — F4320 Adjustment disorder, unspecified: Secondary | ICD-10-CM | POA: Diagnosis not present

## 2024-08-31 DIAGNOSIS — S39012S Strain of muscle, fascia and tendon of lower back, sequela: Secondary | ICD-10-CM

## 2024-08-31 DIAGNOSIS — S161XXD Strain of muscle, fascia and tendon at neck level, subsequent encounter: Secondary | ICD-10-CM | POA: Diagnosis not present

## 2024-08-31 DIAGNOSIS — S39012D Strain of muscle, fascia and tendon of lower back, subsequent encounter: Secondary | ICD-10-CM | POA: Diagnosis not present

## 2024-08-31 DIAGNOSIS — M545 Low back pain, unspecified: Secondary | ICD-10-CM
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
## 2024-08-31 NOTE — Telephone Encounter (Signed)
 Patient called and asked if she could be seen today. She is supposed to go back to work tomorrow and her appt here is tomorrow but she did not know if Dr.Corey wanted her to go back to work. She is also seeing Dr.[name] today and she does not know if she needs to wait to see what Dr. Joane says before seeing her. Please advise.

## 2024-08-31 NOTE — Patient Instructions (Signed)
 Fu with dr Joane as planned and ququa therapy  Standing over sitting

## 2024-08-31 NOTE — Telephone Encounter (Signed)
 Called and spoke with patient, she let me know that Dr. Charlett has written her out of work.   I advised pt that we can make any necessary adjustments to the letter, if needed, during f/u visit tomorrow. Pt verbalized understanding.

## 2024-09-01 ENCOUNTER — Telehealth: Payer: Self-pay

## 2024-09-01 ENCOUNTER — Ambulatory Visit (INDEPENDENT_AMBULATORY_CARE_PROVIDER_SITE_OTHER): Admitting: Family Medicine

## 2024-09-01 ENCOUNTER — Ambulatory Visit: Payer: Self-pay | Admitting: Family Medicine

## 2024-09-01 VITALS — BP 164/102 | HR 85 | Ht 60.0 in | Wt 153.0 lb

## 2024-09-01 DIAGNOSIS — M5416 Radiculopathy, lumbar region: Secondary | ICD-10-CM

## 2024-09-01 DIAGNOSIS — R29898 Other symptoms and signs involving the musculoskeletal system: Secondary | ICD-10-CM | POA: Diagnosis not present

## 2024-09-01 NOTE — Progress Notes (Signed)
 Lumbar spine MRI shows arthritis and bulging disc but no large multi nerve impingement which is good news.  Recommend return to clinic to go over the results in full detail in the near future.

## 2024-09-01 NOTE — Patient Instructions (Addendum)
 Thank you for coming in today.   Please call [facility] (formally [facility]) at [phone] to schedule your spine injection.    Work note provided  Let me know how it goes

## 2024-09-01 NOTE — Telephone Encounter (Signed)
 Continuous leave Start 08/08/24 End 10/10/24

## 2024-09-06 NOTE — Telephone Encounter (Signed)
 Form completed, placed on Dr. Zollie Pee desk to review and sign.

## 2024-09-07 NOTE — Telephone Encounter (Signed)
 Form reviewed and signed by Dr. Denyse Amass, placed at the front desk for faxing/scanning.

## 2024-09-08 ENCOUNTER — Ambulatory Visit: Attending: Internal Medicine | Admitting: Physical Therapy

## 2024-09-08 ENCOUNTER — Encounter: Payer: Self-pay | Admitting: Physical Therapy

## 2024-09-08 ENCOUNTER — Encounter: Admitting: Gastroenterology

## 2024-09-08 ENCOUNTER — Telehealth: Payer: Self-pay | Admitting: Genetic Counselor

## 2024-09-08 ENCOUNTER — Ambulatory Visit: Payer: Self-pay | Admitting: Genetic Counselor

## 2024-09-08 DIAGNOSIS — R252 Cramp and spasm: Secondary | ICD-10-CM | POA: Diagnosis not present

## 2024-09-08 DIAGNOSIS — R262 Difficulty in walking, not elsewhere classified: Secondary | ICD-10-CM | POA: Insufficient documentation

## 2024-09-08 DIAGNOSIS — Z1379 Encounter for other screening for genetic and chromosomal anomalies: Secondary | ICD-10-CM | POA: Insufficient documentation

## 2024-09-08 DIAGNOSIS — M546 Pain in thoracic spine: Secondary | ICD-10-CM | POA: Diagnosis not present

## 2024-09-08 DIAGNOSIS — M6281 Muscle weakness (generalized): Secondary | ICD-10-CM | POA: Diagnosis not present

## 2024-09-08 DIAGNOSIS — M542 Cervicalgia: Secondary | ICD-10-CM | POA: Insufficient documentation

## 2024-09-08 DIAGNOSIS — R293 Abnormal posture: Secondary | ICD-10-CM | POA: Insufficient documentation

## 2024-09-08 DIAGNOSIS — M5459 Other low back pain: Secondary | ICD-10-CM | POA: Diagnosis not present

## 2024-09-08 DIAGNOSIS — M79641 Pain in right hand: Secondary | ICD-10-CM | POA: Insufficient documentation

## 2024-09-08 NOTE — Telephone Encounter (Signed)
 I contacted  [name] to discuss her genetic testing results. No pathogenic variants were identified in the 70 genes analyzed. Discussed that we do not know why  there is cancer in the family. It could be due to a different gene that we are not testing, or maybe our current technology may not be able to pick something up.  It will be important for her to keep in contact with genetics to keep up with whether additional testing may be needed. Detailed clinic note to follow.   The test report will be scanned into EPIC and will be located under the Molecular Pathology section of the Results Review tab.  A portion of the result report is included below for reference.
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
## 2024-09-09 ENCOUNTER — Ambulatory Visit (HOSPITAL_COMMUNITY)
Admission: RE | Admit: 2024-09-09 | Discharge: 2024-09-09 | Disposition: A | Discharge status: Home / Self Care | Source: Ambulatory Visit | Attending: Cardiovascular Disease | Admitting: Cardiovascular Disease

## 2024-09-09 DIAGNOSIS — R011 Cardiac murmur, unspecified: Secondary | ICD-10-CM

## 2024-09-09 LAB — ECHOCARDIOGRAM COMPLETE
AR max vel: 1.85 cm2
AV Area VTI: 1.96 cm2
AV Area mean vel: 1.87 cm2
AV Mean grad: 6 mmHg
AV Peak grad: 12.8 mmHg
Ao pk vel: 1.79 m/s
Area-P 1/2: 3.21 cm2
S' Lateral: 2.8 cm

## 2024-09-09 NOTE — Progress Notes (Signed)
 HPI:  Ms. Brooke was previously seen in the [facility] due to a family history of cancer and concerns regarding a hereditary predisposition to cancer. Please refer to our prior cancer genetics clinic note for more information regarding our discussion, assessment and recommendations, at the time. Ms. Dutta recent genetic test results were disclosed to her, as were recommendations warranted by these results. These results and recommendations are discussed in more detail below.  Results were disclosed via telephone on 09/08/24.   FAMILY HISTORY:  We obtained a detailed, 4-generation family history.  Significant diagnoses are listed below: Family History  Problem Relation Age of Onset   Hypertension Mother    Alzheimer's disease Mother    Stroke Mother    Hypertension Father    Prostate cancer Father 11       metastatic   Cancer Father    Lupus Sister    HIV Brother    Alzheimer's disease Maternal Aunt        four maternal aunts   Alzheimer's disease Maternal Uncle        three maternal uncles   Alzheimer's disease Maternal Uncle    Lung cancer Maternal Uncle    Ovarian cancer Paternal Aunt 41   Prostate cancer Paternal Uncle 67       metastatic   Stroke Maternal Grandmother    Stroke Maternal Grandfather    Colon cancer Paternal Grandmother 46   Lung cancer Paternal Grandfather        worked in tobacco mill   Rectal cancer Other 34       paternal great aunt   Lung cancer Other        paternal great aunt   Lung cancer Other        paternal great aunt   Esophageal cancer Neg Hx    Liver cancer Neg Hx    Pancreatic cancer Neg Hx    Stomach cancer Neg Hx    Inflammatory bowel disease Neg Hx    Liver disease Neg Hx     Ms. Thorp is unaware of previous family history of genetic testing for hereditary cancer risks There is no reported Ashkenazi Jewish ancestry.      GENETIC TEST RESULTS: Genetic testing reported out on 09/05/24 through the  [facility] +RNA panel found no pathogenic mutations. This included analysis of the following 70 genes: AIP, ALK, APC, ATM, AXIN2, BAP1, BARD1, BLM, BMPR1A, BRCA1, BRCA2, BRIP1, CDC73, CDH1, CDK4, CDKN1B, CDKN2A, CHEK2, CTNNA1, DICER1, EGFR, EPCAM, FH, FLCN, GREM1, HOXB13, KIT, LZTR1, MAX, MBD4, MEN1, MET, MITF, MLH1, MSH2, MSH6, MUTYH, NF1, NF2, NTHL1, PALB2, PDGFRA, PMS2, POLD1, POLE, POT1, PRKAR1A, PTCH1, PTEN, RAD51C, RAD51D, RB1, RET, SDHA, SDHAF2, SDHB, SDHC, SDHD, SMAD4, SMARCA4, SMARCB1, SMARCE1, STK11, SUFU, TMEM127, TP53, TSC1, TSC2, VHL. The test report has been scanned into EPIC and is located under the Molecular Pathology section of the Results Review tab.  A portion of the result report is included below for reference.     We discussed with Ms. Mollenkopf that because current genetic testing is not perfect, it is possible there may be a gene mutation in one of these genes that current testing cannot detect, but that chance is small.  We also discussed, that there could be another gene that has not yet been discovered, or that we have not yet tested, that is responsible for the cancer diagnoses in the family. It is also possible there is a hereditary cause for the cancer in the family  that Ms. Rohrer did not inherit and therefore was not identified in her testing.  Therefore, it is important to remain in touch with cancer genetics in the future so that we can continue to offer Ms. Delauter the most up to date genetic testing.   ADDITIONAL GENETIC TESTING: We discussed with Ms. Hoerner that her genetic testing was fairly extensive.  If there are genes identified to increase cancer risk that can be analyzed in the future, we would be happy to discuss and coordinate this testing at that time.    CANCER SCREENING RECOMMENDATIONS: Ms. Maille test result is considered negative (normal).  This means that we have not identified a hereditary cause for her family history of cancer at this  time. Most cancers happen by chance and this negative test suggests that her family history of cancer may fall into this category.    Possible reasons for Ms. Warmuth's negative genetic test include:  1. There may be a gene mutation in one of these genes that current testing methods cannot detect but that chance is small.  2. There could be another gene that has not yet been discovered, or that we have not yet tested, that is responsible for the cancer diagnoses in the family.  3.  There may be no hereditary risk for cancer in the family. The cancers in Ms. Pohle and/or her family may be sporadic/familial or due to other genetic and environmental factors. 4. It is also possible there is a hereditary cause for the cancer in the family that Ms. Gilday did not inherit.  Therefore, it is recommended she continue to follow the cancer management and screening guidelines provided by her primary healthcare provider. An individual's cancer risk and medical management are not determined by genetic test results alone. Overall cancer risk assessment incorporates additional factors, including personal medical history, family history, and any available genetic information that may result in a personalized plan for cancer prevention and surveillance  Given Ms. Feighner's family history, we must interpret these negative results with some caution.  Families with features suggestive of hereditary risk for cancer tend to have multiple family members with cancer, diagnoses in multiple generations and diagnoses before the age of 35. Ms. Northrup family exhibits some of these features. Thus, this result may simply reflect our current inability to detect all mutations within these genes or there may be a different gene that has not yet been discovered or tested.   The Tyrer-Cuzick model is one of multiple prediction models developed to estimate an individual's lifetime risk of developing breast cancer. The Tyrer-Cuzick  model is endorsed by the [employer] (NCCN). This model includes many risk factors such as family history, endogenous estrogen exposure, and benign breast disease. The calculation is highly-dependent on the accuracy of clinical data provided by the patient and can change over time. The Tyrer-Cuzick model may be repeated to reflect new information in her personal or family history in the future.   Based on Ms. Melnyk's history, a Beatrice Harvey was used to estimate her risk of developing breast cancer. This estimates her lifetime risk of developing breast cancer to be approximately 3.87%. Five year risk calculated at 1.05% per Alisa model. NCCN recommends consideration of breast MRI screening as an adjunct to mammography for patients at high risk (defined as 20% or greater lifetime risk). Please note that a woman's breast cancer risk changes over time. It may increase or decrease based on age and any changes to the personal and/or  family medical history. The risks and recommendations listed above apply to this patient at this point in time. In the future, she may or may not be eligible for the same medical management strategies and, in some cases, other medical management strategies may become available to her. If she is interested in an updated breast cancer risk assessment at a later date, she can contact us .  For women that do not fall into the high risk breast category (<20% lifetime risk, 5 year risk >1.7, atypia, dense tissue), NCCN recommends breast awareness and clinical encounter every 1-3 years beginning at age 7. For individuals over the age of 50, they recommend breast awareness, annual clinical encounter and annual mammogram with tomosynthesis.   RECOMMENDATIONS FOR FAMILY MEMBERS:  Individuals in this family might be at some increased risk of developing cancer, over the general population risk, simply due to the family history of cancer.  We recommended women in this  family have a yearly mammogram beginning at age 41, or 2 years younger than the earliest onset of cancer, an annual clinical breast exam, and perform monthly breast self-exams. Women in this family should also have a gynecological exam as recommended by their primary provider. All family members should be referred for colonoscopy starting at age 20, or 40 years younger than the earliest onset of cancer.  It is also possible there is a hereditary cause for the cancer in Ms. Baldus's family that she did not inherit and therefore was not identified in her. Family members may benefit from completing their own genetic testing.   FOLLOW-UP: Lastly, we discussed with Ms. Arseneault that cancer genetics is a rapidly advancing field and it is possible that new genetic tests will be appropriate for her and/or her family members in the future. We encouraged her to remain in contact with cancer genetics on an annual basis so we can update her personal and family histories and let her know of advances in cancer genetics that may benefit this family.   Our contact number was provided. Ms. Kiesel questions were answered to her satisfaction, and she knows she is welcome to call us  at anytime with additional questions or concerns.   Burnard Ogren, MS, [facility] Licensed, [email or web address] [phone]

## 2024-09-10 ENCOUNTER — Ambulatory Visit: Payer: Self-pay | Admitting: Cardiology

## 2024-09-13 ENCOUNTER — Other Ambulatory Visit (HOSPITAL_COMMUNITY): Payer: Self-pay

## 2024-09-14 ENCOUNTER — Other Ambulatory Visit (HOSPITAL_COMMUNITY): Payer: Self-pay

## 2024-09-14 DIAGNOSIS — F4320 Adjustment disorder, unspecified: Secondary | ICD-10-CM | POA: Diagnosis not present

## 2024-09-15 ENCOUNTER — Encounter: Payer: Self-pay | Admitting: Gastroenterology

## 2024-09-15 ENCOUNTER — Ambulatory Visit: Admitting: Gastroenterology

## 2024-09-15 ENCOUNTER — Ambulatory Visit (HOSPITAL_BASED_OUTPATIENT_CLINIC_OR_DEPARTMENT_OTHER): Admitting: Physical Therapy

## 2024-09-15 ENCOUNTER — Other Ambulatory Visit (HOSPITAL_COMMUNITY): Payer: Self-pay

## 2024-09-15 VITALS — BP 143/105 | HR 76 | Temp 97.9°F | Resp 24 | Ht 60.0 in | Wt 151.0 lb

## 2024-09-15 DIAGNOSIS — K222 Esophageal obstruction: Secondary | ICD-10-CM

## 2024-09-15 DIAGNOSIS — K319 Disease of stomach and duodenum, unspecified: Secondary | ICD-10-CM | POA: Diagnosis not present

## 2024-09-15 DIAGNOSIS — K2289 Other specified disease of esophagus: Secondary | ICD-10-CM | POA: Diagnosis not present

## 2024-09-15 DIAGNOSIS — R1310 Dysphagia, unspecified: Secondary | ICD-10-CM | POA: Diagnosis not present

## 2024-09-15 DIAGNOSIS — K3189 Other diseases of stomach and duodenum: Secondary | ICD-10-CM | POA: Diagnosis not present

## 2024-09-15 DIAGNOSIS — K219 Gastro-esophageal reflux disease without esophagitis: Secondary | ICD-10-CM | POA: Diagnosis not present

## 2024-09-15 DIAGNOSIS — K229 Disease of esophagus, unspecified: Secondary | ICD-10-CM | POA: Diagnosis not present

## 2024-09-15 DIAGNOSIS — K449 Diaphragmatic hernia without obstruction or gangrene: Secondary | ICD-10-CM

## 2024-09-15 MED ORDER — SODIUM CHLORIDE 0.9 % IV SOLN: 500.0000 mL | INTRAVENOUS | Status: DC

## 2024-09-15 NOTE — Op Note (Signed)
 [facility] Patient Name: [name] Procedure Date: 09/15/2024 8:02 AM MRN: [id number] Endoscopist: Aloha Finner , MD, [id number] Age: 58 Referring MD:  Date of Birth: 17-Aug-1966 Gender: Female Account #: [id number] Procedure:                Upper GI endoscopy Indications:              Dysphagia, Esophageal reflux symptoms that persist                            despite appropriate therapy, Follow-up of                            esophageal stenosis Medicines:                Monitored Anesthesia Care Procedure:                Pre-Anesthesia Assessment:                           - Prior to the procedure, a History and Physical                            was performed, and patient medications and                            allergies were reviewed. The patient's tolerance of                            previous anesthesia was also reviewed. The risks                            and benefits of the procedure and the sedation                            options and risks were discussed with the patient.                            All questions were answered, and informed consent                            was obtained. Prior Anticoagulants: The patient has                            taken no anticoagulant or antiplatelet agents. ASA                            Grade Assessment: II - A patient with mild systemic                            disease. After reviewing the risks and benefits,                            the patient was deemed in satisfactory condition to  undergo the procedure.                           After obtaining informed consent, the endoscope was                            passed under direct vision. Throughout the                            procedure, the patient's blood pressure, pulse, and                            oxygen saturations were monitored continuously. The                            Olympus scope [id number] was  introduced through the                            mouth, and advanced to the second part of duodenum.                            The upper GI endoscopy was accomplished without                            difficulty. The patient tolerated the procedure. Scope In: Scope Out: Findings:                 A non-obstructing Schatzki ring was found at the                            gastroesophageal junction. Disruption biopsies were                            taken with a cold forceps for histology                            circumferentially.                           No other gross lesions were noted in the entire                            esophagus. Biopsies were taken with a cold forceps                            for histology to rule out EoE. After the rest of                            the EGD was completed, a guidewire was placed and                            the scope was withdrawn. Dilation was performed  with a Savary dilator with mild resistance at 16 mm                            and 18 mm. The dilation site was examined following                            each dilation with endoscope reinsertion and showed                            after the 18 mm dilation a mild mucosal disruption                            just below the UES, mild improvement in luminal                            narrowing and no perforation.                           The Z-line was irregular and was found 35 cm from                            the incisors.                           A 3 cm hiatal hernia was present.                           Striped mildly erythematous mucosa without bleeding                            was found in the gastric antrum.                           No other gross lesions were noted in the entire                            examined stomach. Biopsies were taken with a cold                            forceps for histology and Helicobacter pylori                             testing.                           No gross lesions were noted in the duodenal bulb,                            in the first portion of the duodenum and in the                            second portion of the duodenum. Complications:            No immediate complications. Estimated Blood Loss:  Estimated blood loss was minimal. Impression:               - Non-obstructing Schatzki ring. Disrupted.                           - No othergross lesions in the entire esophagus.                            Biopsied. Dilated to 18 mm Savary with mucosal                            wrent below the UES.                           - Z-line irregular, 35 cm from the incisors.                           - 3 cm hiatal hernia.                           - Erythematous mucosa in the antrum. No other gross                            lesions in the entire stomach. Biopsied.                           - No gross lesions in the duodenal bulb, in the                            first portion of the duodenum and in the second                            portion of the duodenum. Recommendation:           - The patient will be observed post-procedure,                            until all discharge criteria are met.                           - Discharge patient to home.                           - Patient has a contact number available for                            emergencies. The signs and symptoms of potential                            delayed complications were discussed with the                            patient. Return to normal activities tomorrow.  Written discharge instructions were provided to the                            patient.                           - Dilation diet as per protocol.                           - Continue PPI BID.                           - Carafate  twice daily (crush and dissolve tablets)                            for 2-weeks and then  continue.                           - Continue present medications.                           - Await pathology results.                           - Repeat upper endoscopy PRN for retreatment.                           - If issues of dysphagia persist then consider                            esophageal manometry in future.                           - The findings and recommendations were discussed                            with the patient.                           - The findings and recommendations were discussed                            with the patient's family. Aloha Finner, MD 09/15/2024 8:22:57 AM

## 2024-09-15 NOTE — Telephone Encounter (Signed)
 [name] is scheduled for ENT on 11/26 at 8:30 am with Dr. Greggory.

## 2024-09-15 NOTE — Progress Notes (Signed)
0800 Robinul 0.1 mg IV given due large amount of secretions upon assessment.  MD made aware, vss 
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
## 2024-09-15 NOTE — Progress Notes (Signed)
 Report given to PACU, vss

## 2024-09-15 NOTE — Patient Instructions (Signed)
 YOU HAD AN ENDOSCOPIC PROCEDURE TODAY AT THE [facility]:   Refer to the procedure report that was given to you for any specific questions about what was found during the examination.  If the procedure report does not answer your questions, please call your gastroenterologist to clarify.  If you requested that your care partner not be given the details of your procedure findings, then the procedure report has been included in a sealed envelope for you to review at your convenience later.  YOU SHOULD EXPECT: Some feelings of bloating in the abdomen. Passage of more gas than usual.  Walking can help get rid of the air that was put into your GI tract during the procedure and reduce the bloating. If you had a lower endoscopy (such as a colonoscopy or flexible sigmoidoscopy) you may notice spotting of blood in your stool or on the toilet paper. If you underwent a bowel prep for your procedure, you may not have a normal bowel movement for a few days.  Please Note:  You might notice some irritation and congestion in your nose or some drainage.  This is from the oxygen used during your procedure.  There is no need for concern and it should clear up in a day or so.  SYMPTOMS TO REPORT IMMEDIATELY:   Following upper endoscopy (EGD)  Vomiting of blood or coffee ground material  New chest pain or pain under the shoulder blades  Painful or persistently difficult swallowing  New shortness of breath  Fever of 100F or higher  Black, tarry-looking stools  Dilation diet as per protocol Carafate  twice daily (crush and dissolve tablets) for 2 weeks  Await pathology results Repeat upper endoscopy as needed for retreatment Continue PPI twice daily   For urgent or emergent issues, a gastroenterologist can be reached at any hour by calling [phone]. Do not use MyChart messaging for urgent concerns.    DIET:  We do recommend a small meal at first, but then you may proceed to your regular diet.   Drink plenty of fluids but you should avoid alcoholic beverages for 24 hours.  ACTIVITY:  You should plan to take it easy for the rest of today and you should NOT DRIVE or use heavy machinery until tomorrow (because of the sedation medicines used during the test).    FOLLOW UP: Our staff will call the number listed on your records the next business day following your procedure.  We will call around 7:15- 8:00 am to check on you and address any questions or concerns that you may have regarding the information given to you following your procedure. If we do not reach you, we will leave a message.     If any biopsies were taken you will be contacted by phone or by letter within the next 1-3 weeks.  Please call us  at [phone] if you have not heard about the biopsies in 3 weeks.    SIGNATURES/CONFIDENTIALITY: You and/or your care partner have signed paperwork which will be entered into your electronic medical record.  These signatures attest to the fact that that the information above on your After Visit Summary has been reviewed and is understood.  Full responsibility of the confidentiality of this discharge information lies with you and/or your care-partner.

## 2024-09-15 NOTE — Progress Notes (Signed)
 Called to room to assist during endoscopic procedure.  Patient ID and intended procedure confirmed with present staff. Received instructions for my participation in the procedure from the performing physician.

## 2024-09-16 ENCOUNTER — Telehealth: Payer: Self-pay

## 2024-09-16 NOTE — Telephone Encounter (Signed)
  Follow up Call-     09/15/2024    7:15 AM 03/11/2022    7:26 AM  Call back number  Post procedure Call Back phone  # [phone]  Permission to leave phone message Yes Yes     Patient questions:  Do you have a fever, pain , or abdominal swelling? No. Pain Score  0 *  Have you tolerated food without any problems? Yes.    Have you been able to return to your normal activities? Yes.    Do you have any questions about your discharge instructions: Diet   No. Medications  No. Follow up visit  No.  Do you have questions or concerns about your Care? No.  Actions: * If pain score is 4 or above: No action needed, pain <4.

## 2024-09-17 NOTE — Discharge Instructions (Signed)

## 2024-09-20 ENCOUNTER — Ambulatory Visit
Admission: RE | Admit: 2024-09-20 | Discharge: 2024-09-20 | Disposition: A | Discharge status: Home / Self Care | Source: Ambulatory Visit | Attending: Family Medicine | Admitting: Family Medicine

## 2024-09-20 DIAGNOSIS — M5116 Intervertebral disc disorders with radiculopathy, lumbar region: Secondary | ICD-10-CM | POA: Diagnosis not present

## 2024-09-20 DIAGNOSIS — R29898 Other symptoms and signs involving the musculoskeletal system: Secondary | ICD-10-CM

## 2024-09-20 DIAGNOSIS — M5416 Radiculopathy, lumbar region: Secondary | ICD-10-CM

## 2024-09-20 LAB — SURGICAL PATHOLOGY

## 2024-09-20 MED ORDER — METHYLPREDNISOLONE ACETATE 40 MG/ML INJ SUSP (RADIOLOG
80.0000 mg | Freq: Once | INTRAMUSCULAR | Status: AC
  Administered 2024-09-20: 80 mg via EPIDURAL

## 2024-09-20 MED ORDER — IOPAMIDOL (ISOVUE-M 200) INJECTION 41%
1.0000 mL | Freq: Once | INTRAMUSCULAR | Status: AC
  Administered 2024-09-20: 1 mL via EPIDURAL

## 2024-09-21 ENCOUNTER — Telehealth: Payer: Self-pay | Admitting: Family Medicine

## 2024-09-21 NOTE — Telephone Encounter (Signed)
 Patient had injection 09/20/2024 at [facility]. As of today her back pain is worse, sharp and throbbing from back to let. Has been taking Tylenol  with no help and does not want to take Robaxin  as it has not been helping before this injection.  Can we call something else in for pain?  [facility]

## 2024-09-21 NOTE — Telephone Encounter (Signed)
 Forwarding to Dr. Denyse Amass to review and advise.

## 2024-09-22 ENCOUNTER — Ambulatory Visit: Admitting: Physical Therapy

## 2024-09-22 ENCOUNTER — Ambulatory Visit: Payer: Self-pay | Admitting: Gastroenterology

## 2024-09-27 ENCOUNTER — Ambulatory Visit (HOSPITAL_BASED_OUTPATIENT_CLINIC_OR_DEPARTMENT_OTHER): Attending: Internal Medicine | Admitting: Physical Therapy

## 2024-09-27 ENCOUNTER — Encounter (HOSPITAL_BASED_OUTPATIENT_CLINIC_OR_DEPARTMENT_OTHER): Payer: Self-pay | Admitting: Physical Therapy

## 2024-09-27 DIAGNOSIS — M6281 Muscle weakness (generalized): Secondary | ICD-10-CM | POA: Insufficient documentation

## 2024-09-27 DIAGNOSIS — M546 Pain in thoracic spine: Secondary | ICD-10-CM | POA: Insufficient documentation

## 2024-09-27 DIAGNOSIS — M5459 Other low back pain: Secondary | ICD-10-CM | POA: Insufficient documentation
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
## 2024-09-28 MED ORDER — OXYCODONE-ACETAMINOPHEN 5-325 MG PO TABS: 1.0000 | ORAL_TABLET | Freq: Four times a day (QID) | ORAL | 0 refills | Status: DC | PRN

## 2024-09-28 NOTE — Telephone Encounter (Signed)
 Oxycodone  medication sent to pharmacy.

## 2024-09-28 NOTE — Addendum Note (Signed)
 Addended by: JOANE ARTIST RAMAN on: 09/28/2024 07:20 AM   Modules accepted: Orders

## 2024-09-29 ENCOUNTER — Ambulatory Visit (INDEPENDENT_AMBULATORY_CARE_PROVIDER_SITE_OTHER)

## 2024-09-29 ENCOUNTER — Encounter (INDEPENDENT_AMBULATORY_CARE_PROVIDER_SITE_OTHER): Payer: Self-pay

## 2024-09-29 VITALS — BP 125/80 | HR 66 | Ht 60.0 in | Wt 153.0 lb

## 2024-09-29 DIAGNOSIS — J381 Polyp of vocal cord and larynx: Secondary | ICD-10-CM | POA: Diagnosis not present

## 2024-09-29 DIAGNOSIS — R490 Dysphonia: Secondary | ICD-10-CM | POA: Diagnosis not present
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
## 2024-10-06 ENCOUNTER — Encounter: Payer: Self-pay | Admitting: Physical Therapy

## 2024-10-06 ENCOUNTER — Ambulatory Visit: Attending: Internal Medicine | Admitting: Physical Therapy

## 2024-10-06 DIAGNOSIS — R262 Difficulty in walking, not elsewhere classified: Secondary | ICD-10-CM | POA: Insufficient documentation

## 2024-10-06 DIAGNOSIS — M542 Cervicalgia: Secondary | ICD-10-CM | POA: Diagnosis not present

## 2024-10-06 DIAGNOSIS — M6281 Muscle weakness (generalized): Secondary | ICD-10-CM | POA: Diagnosis not present

## 2024-10-06 DIAGNOSIS — R252 Cramp and spasm: Secondary | ICD-10-CM | POA: Diagnosis not present

## 2024-10-06 DIAGNOSIS — M25641 Stiffness of right hand, not elsewhere classified: Secondary | ICD-10-CM | POA: Diagnosis not present

## 2024-10-06 DIAGNOSIS — M79641 Pain in right hand: Secondary | ICD-10-CM | POA: Diagnosis not present

## 2024-10-06 DIAGNOSIS — M546 Pain in thoracic spine: Secondary | ICD-10-CM | POA: Insufficient documentation

## 2024-10-06 DIAGNOSIS — M5459 Other low back pain: Secondary | ICD-10-CM | POA: Diagnosis not present

## 2024-10-06 DIAGNOSIS — R293 Abnormal posture: Secondary | ICD-10-CM | POA: Diagnosis not present

## 2024-10-06 DIAGNOSIS — M25511 Pain in right shoulder: Secondary | ICD-10-CM | POA: Insufficient documentation

## 2024-10-06 DIAGNOSIS — M25611 Stiffness of right shoulder, not elsewhere classified: Secondary | ICD-10-CM | POA: Diagnosis not present
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
## 2024-10-08 ENCOUNTER — Other Ambulatory Visit: Payer: Self-pay | Admitting: Family Medicine

## 2024-10-08 ENCOUNTER — Encounter (HOSPITAL_COMMUNITY): Payer: Self-pay

## 2024-10-08 ENCOUNTER — Ambulatory Visit (INDEPENDENT_AMBULATORY_CARE_PROVIDER_SITE_OTHER): Admitting: Family Medicine

## 2024-10-08 ENCOUNTER — Other Ambulatory Visit: Payer: Self-pay

## 2024-10-08 ENCOUNTER — Ambulatory Visit

## 2024-10-08 VITALS — BP 130/80 | HR 70 | Ht 60.0 in | Wt 158.0 lb

## 2024-10-08 DIAGNOSIS — M160 Bilateral primary osteoarthritis of hip: Secondary | ICD-10-CM | POA: Diagnosis not present

## 2024-10-08 DIAGNOSIS — M5416 Radiculopathy, lumbar region: Secondary | ICD-10-CM | POA: Diagnosis not present

## 2024-10-08 DIAGNOSIS — M25551 Pain in right hip: Secondary | ICD-10-CM

## 2024-10-08 DIAGNOSIS — M25552 Pain in left hip: Secondary | ICD-10-CM

## 2024-10-08 NOTE — Patient Instructions (Addendum)
 Thank you for coming in today.   Please call [facility] (formally [facility]) at [phone] to schedule your spine injection.    I've referred you to NeuroSurgery.  Let us  know if you don't hear from them in one week.   Check back in 1 month
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
## 2024-10-08 NOTE — Pre-Procedure Instructions (Signed)
-------------    SDW INSTRUCTIONS given:  Your procedure is scheduled on 12/9.  Report to [facility] Main Entrance A at 11:30 A.M., and check in at the Admitting office.  Any questions or running late day of surgery: call [phone]    Remember:  Do not eat after midnight the night before your surgery  You may drink clear liquids until 11:00 AM the morning of your surgery.   Clear liquids allowed are: Water, Non-Citrus Juices (without pulp), Carbonated Beverages, Clear Tea, Black Coffee Only, and Gatorade    Take these medicines the morning of surgery with A SIP OF WATER  budesonide -formoterol  (SYMBICORT )  esomeprazole  (NEXIUM )  fluticasone  (FLONASE )      May take these medicines IF NEEDED: albuterol  (PROVENTIL )  Albuterol -Budesonide  (AIRSUPRA )  azelastine  (ASTELIN )  EPINEPHrine   ondansetron  (ZOFRAN -ODT)  oxyCODONE -acetaminophen  (PERCOCET/ROXICET)   As of today, STOP taking any Aspirin  (unless otherwise instructed by your surgeon) Aleve , Naproxen , Ibuprofen , Motrin , Advil , Goody's, BC's, all herbal medications, fish oil, and all vitamins.   Do NOT Smoke (Tobacco/Vaping) 24 hours prior to your procedure  If you use a CPAP at night, you may bring all equipment for your overnight stay.     You will be asked to remove any contacts, glasses, piercing's, hearing aid's, dentures/partials prior to surgery. Please bring cases for these items if needed.     Patients discharged the day of surgery will not be allowed to drive home, and someone needs to stay with them for 24 hours.  SURGICAL WAITING ROOM VISITATION Patients may have no more than 2 support people in the waiting area - these visitors may rotate.   Pre-op nurse will coordinate an appropriate time for 1 ADULT support person, who may not rotate, to accompany patient in pre-op.  Children under the age of 50 must have an adult with them who is not the patient and must remain in the main waiting area with an adult.  If the  patient needs to stay at the hospital during part of their recovery, the visitor guidelines for inpatient rooms apply.  Please refer to the [facility] website for the visitor guidelines for any additional information.   Special instructions:   [place]- Preparing For Surgery   Please follow these instructions carefully.   Shower the NIGHT BEFORE SURGERY and the MORNING OF SURGERY with DIAL Soap.   Pat yourself dry with a CLEAN TOWEL.  Wear CLEAN PAJAMAS to bed the night before surgery  Place CLEAN SHEETS on your bed the night of your first shower and DO NOT SLEEP WITH PETS.   Additional instructions for the day of surgery: DO NOT APPLY any lotions, deodorants, cologne, or perfumes.   Do not wear jewelry or makeup Do not wear nail polish, gel polish, artificial nails, or any other type of covering on natural nails (fingers and toes) Do not bring valuables to the hospital. [facility] is not responsible for valuables/personal belongings. Put on clean/comfortable clothes.  Please brush your teeth.  Ask your nurse before applying any prescription medications to the skin.

## 2024-10-08 NOTE — Progress Notes (Signed)
 PCP - Dr. Apolinar [place] Cardiologist - Dr. Oneil Parchment  PPM/ICD - denies   Chest x-ray - 08/08/24 EKG - 07/28/24 Stress Test - denies ECHO - 09/09/24 Cardiac Cath - denies  CPAP - denies  DM- denies  ASA/Blood Thinner Instructions: n/a   ERAS Protcol - clears until 1100  COVID TEST- n/a  Anesthesia review: yes, cardiac hx  Patient verbally denies any shortness of breath, fever, cough and chest pain during phone call       Questions were answered. Patient verbalized understanding of instructions.

## 2024-10-09 ENCOUNTER — Encounter: Payer: Self-pay | Admitting: Family Medicine

## 2024-10-10 ENCOUNTER — Other Ambulatory Visit: Payer: Self-pay | Admitting: Internal Medicine

## 2024-10-11 ENCOUNTER — Encounter: Payer: Self-pay | Admitting: Family Medicine

## 2024-10-11 ENCOUNTER — Telehealth: Payer: Self-pay

## 2024-10-11 ENCOUNTER — Ambulatory Visit

## 2024-10-11 DIAGNOSIS — M6281 Muscle weakness (generalized): Secondary | ICD-10-CM

## 2024-10-11 DIAGNOSIS — M546 Pain in thoracic spine: Secondary | ICD-10-CM

## 2024-10-11 DIAGNOSIS — M542 Cervicalgia: Secondary | ICD-10-CM | POA: Diagnosis not present

## 2024-10-11 DIAGNOSIS — R293 Abnormal posture: Secondary | ICD-10-CM | POA: Diagnosis not present

## 2024-10-11 DIAGNOSIS — R262 Difficulty in walking, not elsewhere classified: Secondary | ICD-10-CM

## 2024-10-11 DIAGNOSIS — M25511 Pain in right shoulder: Secondary | ICD-10-CM | POA: Diagnosis not present

## 2024-10-11 DIAGNOSIS — R252 Cramp and spasm: Secondary | ICD-10-CM

## 2024-10-11 DIAGNOSIS — M5459 Other low back pain: Secondary | ICD-10-CM | POA: Diagnosis not present

## 2024-10-11 DIAGNOSIS — M25641 Stiffness of right hand, not elsewhere classified: Secondary | ICD-10-CM | POA: Diagnosis not present

## 2024-10-11 DIAGNOSIS — M25611 Stiffness of right shoulder, not elsewhere classified: Secondary | ICD-10-CM | POA: Diagnosis not present

## 2024-10-11 DIAGNOSIS — M79641 Pain in right hand: Secondary | ICD-10-CM | POA: Diagnosis not present
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
## 2024-10-11 NOTE — Telephone Encounter (Signed)
 ADA form  Renewal of short-term disability.

## 2024-10-11 NOTE — Telephone Encounter (Signed)
 Forwarding to Dr. Denyse Amass to review and advise.

## 2024-10-12 ENCOUNTER — Encounter (HOSPITAL_COMMUNITY): Admission: RE | Disposition: A | Payer: Self-pay | Source: Home / Self Care

## 2024-10-12 ENCOUNTER — Ambulatory Visit (HOSPITAL_COMMUNITY): Payer: Self-pay | Admitting: Physician Assistant

## 2024-10-12 ENCOUNTER — Other Ambulatory Visit: Payer: Self-pay

## 2024-10-12 ENCOUNTER — Encounter (HOSPITAL_COMMUNITY): Payer: Self-pay

## 2024-10-12 ENCOUNTER — Ambulatory Visit (HOSPITAL_COMMUNITY): Admission: RE | Admit: 2024-10-12 | Discharge: 2024-10-12 | Disposition: A

## 2024-10-12 ENCOUNTER — Other Ambulatory Visit (HOSPITAL_COMMUNITY): Payer: Self-pay

## 2024-10-12 DIAGNOSIS — J381 Polyp of vocal cord and larynx: Secondary | ICD-10-CM | POA: Diagnosis not present

## 2024-10-12 DIAGNOSIS — J45909 Unspecified asthma, uncomplicated: Secondary | ICD-10-CM | POA: Diagnosis not present

## 2024-10-12 DIAGNOSIS — Z87891 Personal history of nicotine dependence: Secondary | ICD-10-CM | POA: Diagnosis not present

## 2024-10-12 DIAGNOSIS — R490 Dysphonia: Secondary | ICD-10-CM | POA: Diagnosis not present

## 2024-10-12 DIAGNOSIS — Z79899 Other long term (current) drug therapy: Secondary | ICD-10-CM | POA: Diagnosis not present

## 2024-10-12 DIAGNOSIS — K219 Gastro-esophageal reflux disease without esophagitis: Secondary | ICD-10-CM | POA: Diagnosis not present

## 2024-10-12 DIAGNOSIS — K449 Diaphragmatic hernia without obstruction or gangrene: Secondary | ICD-10-CM | POA: Diagnosis not present

## 2024-10-12 DIAGNOSIS — I10 Essential (primary) hypertension: Secondary | ICD-10-CM | POA: Diagnosis not present

## 2024-10-12 HISTORY — PX: DIRECT LARYNGOSCOPY: SHX5326

## 2024-10-12 HISTORY — PX: MICROLARYNGOSCOPY W/VOCAL CORD INJECTION: SHX2665

## 2024-10-12 LAB — BASIC METABOLIC PANEL WITH GFR
Anion gap: 12 (ref 5–15)
BUN: 10 mg/dL (ref 6–20)
CO2: 22 mmol/L (ref 22–32)
Calcium: 9.3 mg/dL (ref 8.9–10.3)
Chloride: 106 mmol/L (ref 98–111)
Creatinine, Ser: 0.97 mg/dL (ref 0.44–1.00)
GFR, Estimated: >60 mL/min (ref >60)
Glucose, Bld: 84 mg/dL (ref 70–99)
Potassium: 3.1 mmol/L — ABNORMAL LOW (ref 3.5–5.1)
Sodium: 140 mmol/L (ref 135–145)

## 2024-10-12 LAB — CBC
HCT: 37.6 % (ref 36.0–46.0)
Hemoglobin: 12.1 g/dL (ref 12.0–15.0)
MCH: 27.1 pg (ref 26.0–34.0)
MCHC: 32.2 g/dL (ref 30.0–36.0)
MCV: 84.1 fL (ref 80.0–100.0)
Platelets: 307 K/uL (ref 150–400)
RBC: 4.47 MIL/uL (ref 3.87–5.11)
RDW: 15.9 % — ABNORMAL HIGH (ref 11.5–15.5)
WBC: 4.9 K/uL (ref 4.0–10.5)
nRBC: 0 % (ref 0.0–0.2)

## 2024-10-12 SURGERY — LARYNGOSCOPY, DIRECT
Anesthesia: General

## 2024-10-12 MED ORDER — OXYMETAZOLINE HCL 0.05 % NA SOLN
NASAL | Status: AC
  Filled 2024-10-12: qty 30

## 2024-10-12 MED ORDER — FENTANYL CITRATE (PF) 100 MCG/2ML IJ SOLN
INTRAMUSCULAR | Status: AC
  Filled 2024-10-12: qty 2

## 2024-10-12 MED ORDER — OXYCODONE HCL 5 MG PO TABS
5.0000 mg | ORAL_TABLET | Freq: Once | ORAL | Status: AC | PRN
  Administered 2024-10-12: 5 mg via ORAL

## 2024-10-12 MED ORDER — ACETAMINOPHEN 10 MG/ML IV SOLN: 1000.0000 mg | Freq: Once | INTRAVENOUS | Status: DC | PRN

## 2024-10-12 MED ORDER — MIDAZOLAM HCL 2 MG/2ML IJ SOLN
INTRAMUSCULAR | Status: AC
  Filled 2024-10-12: qty 2

## 2024-10-12 MED ORDER — PROPOFOL 10 MG/ML IV BOLUS
INTRAVENOUS | Status: AC
  Filled 2024-10-12: qty 20

## 2024-10-12 MED ORDER — OXYCODONE HCL 5 MG PO TABS
ORAL_TABLET | ORAL | Status: AC
  Filled 2024-10-12: qty 1

## 2024-10-12 MED ORDER — ROCURONIUM BROMIDE 10 MG/ML (PF) SYRINGE
PREFILLED_SYRINGE | INTRAVENOUS | Status: DC | PRN
  Administered 2024-10-12: 40 mg via INTRAVENOUS
  Administered 2024-10-12: 20 mg via INTRAVENOUS

## 2024-10-12 MED ORDER — OXYCODONE HCL 5 MG/5ML PO SOLN: 5.0000 mg | Freq: Once | ORAL | Status: AC | PRN

## 2024-10-12 MED ORDER — EPINEPHRINE PF 1 MG/ML IJ SOLN
INTRAMUSCULAR | Status: DC | PRN
  Administered 2024-10-12: .5 mg via SUBCUTANEOUS

## 2024-10-12 MED ORDER — HYDROCODONE-ACETAMINOPHEN 5-325 MG PO TABS
1.0000 | ORAL_TABLET | Freq: Four times a day (QID) | ORAL | 0 refills | Status: DC | PRN
  Filled 2024-10-12: qty 12, 3d supply, fill #0

## 2024-10-12 MED ORDER — SUGAMMADEX SODIUM 200 MG/2ML IV SOLN
INTRAVENOUS | Status: DC | PRN
  Administered 2024-10-12: 300 mg via INTRAVENOUS

## 2024-10-12 MED ORDER — DEXAMETHASONE SOD PHOSPHATE PF 10 MG/ML IJ SOLN
INTRAMUSCULAR | Status: DC | PRN
  Administered 2024-10-12: 10 mg via INTRAVENOUS

## 2024-10-12 MED ORDER — EPINEPHRINE HCL (NASAL) 0.1 % NA SOLN
NASAL | Status: AC
  Filled 2024-10-12: qty 60

## 2024-10-12 MED ORDER — FENTANYL CITRATE (PF) 100 MCG/2ML IJ SOLN: 25.0000 ug | INTRAMUSCULAR | Status: DC | PRN

## 2024-10-12 MED ORDER — PROPOFOL 10 MG/ML IV BOLUS
INTRAVENOUS | Status: DC | PRN
  Administered 2024-10-12: 200 mg via INTRAVENOUS

## 2024-10-12 MED ORDER — TRAMADOL HCL 50 MG PO TABS
50.0000 mg | ORAL_TABLET | Freq: Four times a day (QID) | ORAL | 0 refills | Status: DC | PRN
  Filled 2024-10-12: qty 10, 3d supply, fill #0

## 2024-10-12 MED ORDER — DEXMEDETOMIDINE HCL IN NACL 80 MCG/20ML IV SOLN
INTRAVENOUS | Status: DC | PRN
  Administered 2024-10-12: 12 ug via INTRAVENOUS

## 2024-10-12 MED ORDER — TRIAMCINOLONE ACETONIDE 40 MG/ML IJ SUSP
INTRAMUSCULAR | Status: DC | PRN
  Administered 2024-10-12: .5 mL via INTRAMUSCULAR

## 2024-10-12 MED ORDER — SODIUM CHLORIDE (PF) 0.9 % IJ SOLN
INTRAMUSCULAR | Status: AC
  Filled 2024-10-12: qty 10

## 2024-10-12 MED ORDER — MIDAZOLAM HCL (PF) 2 MG/2ML IJ SOLN
INTRAMUSCULAR | Status: DC | PRN
  Administered 2024-10-12: 2 mg via INTRAVENOUS

## 2024-10-12 MED ORDER — ORAL CARE MOUTH RINSE: 15.0000 mL | Freq: Once | OROMUCOSAL | Status: DC

## 2024-10-12 MED ORDER — FENTANYL CITRATE (PF) 250 MCG/5ML IJ SOLN
INTRAMUSCULAR | Status: DC | PRN
  Administered 2024-10-12 (×4): 50 ug via INTRAVENOUS

## 2024-10-12 MED ORDER — DROPERIDOL 2.5 MG/ML IJ SOLN: 0.6250 mg | Freq: Once | INTRAMUSCULAR | Status: DC | PRN

## 2024-10-12 MED ORDER — ONDANSETRON HCL 4 MG/2ML IJ SOLN
INTRAMUSCULAR | Status: DC | PRN
  Administered 2024-10-12: 4 mg via INTRAVENOUS

## 2024-10-12 MED ORDER — TRIAMCINOLONE ACETONIDE 40 MG/ML IJ SUSP
INTRAMUSCULAR | Status: AC
  Filled 2024-10-12: qty 5

## 2024-10-12 MED ORDER — LACTATED RINGERS IV SOLN: INTRAVENOUS | Status: DC

## 2024-10-12 MED ORDER — LIDOCAINE 2% (20 MG/ML) 5 ML SYRINGE
INTRAMUSCULAR | Status: DC | PRN
  Administered 2024-10-12: 40 mg via INTRAVENOUS

## 2024-10-12 MED ORDER — CHLORHEXIDINE GLUCONATE 0.12 % MT SOLN
15.0000 mL | Freq: Once | OROMUCOSAL | Status: DC
  Filled 2024-10-12: qty 15

## 2024-10-12 SURGICAL SUPPLY — 33 items
BAG COUNTER SPONGE SURGICOUNT (BAG) ×2 IMPLANT
BALLN PULMONARY 10-12 (MISCELLANEOUS) IMPLANT
BALLOON PULM 12 13.5 15X75 (BALLOONS) IMPLANT
BALLOON PULM 15 16.5 18X75 (BALLOONS) IMPLANT
BLADE SURG 15 STRL LF DISP TIS (BLADE) IMPLANT
BNDG EYE OVAL 2 1/8 X 2 5/8 (GAUZE/BANDAGES/DRESSINGS) ×4 IMPLANT
CANISTER SUCTION 3000ML PPV (SUCTIONS) ×2 IMPLANT
CNTNR URN SCR LID CUP LEK RST (MISCELLANEOUS) IMPLANT
COVER BACK TABLE 60X90IN (DRAPES) ×2 IMPLANT
COVER MAYO STAND STRL (DRAPES) ×2 IMPLANT
DRAPE HALF SHEET 40X57 (DRAPES) ×2 IMPLANT
DRSG TELFA 3X8 NADH STRL (GAUZE/BANDAGES/DRESSINGS) ×2 IMPLANT
GAUZE 4X4 16PLY ~~LOC~~+RFID DBL (SPONGE) ×2 IMPLANT
GAUZE SPONGE 4X4 12PLY STRL (GAUZE/BANDAGES/DRESSINGS) ×2 IMPLANT
GLOVE BIO SURGEON STRL SZ 6 (GLOVE) ×2 IMPLANT
GOWN STRL REUS W/ TWL LRG LVL3 (GOWN DISPOSABLE) IMPLANT
GUARD TEETH (MISCELLANEOUS) ×2 IMPLANT
KIT BASIN OR (CUSTOM PROCEDURE TRAY) ×2 IMPLANT
KIT TURNOVER KIT B (KITS) ×2 IMPLANT
NDL HYPO 25GX1X1/2 BEV (NEEDLE) IMPLANT
NDL TRANS ORAL INJECTION (NEEDLE) IMPLANT
PAD ARMBOARD POSITIONER FOAM (MISCELLANEOUS) ×4 IMPLANT
PATTIES SURGICAL .5X1.5 (GAUZE/BANDAGES/DRESSINGS) ×2 IMPLANT
POSITIONER HEAD DONUT 9IN (MISCELLANEOUS) IMPLANT
SET COLLECT BLD 25X3/4 12 (NEEDLE) ×2 IMPLANT
SOLN 0.9% NACL POUR BTL 1000ML (IV SOLUTION) ×2 IMPLANT
SOLN STERILE WATER BTL 1000 ML (IV SOLUTION) ×2 IMPLANT
SOLUTION ANTFG W/FOAM PAD STRL (MISCELLANEOUS) ×2 IMPLANT
SURGILUBE 2OZ TUBE FLIPTOP (MISCELLANEOUS) IMPLANT
SUT SILK 2 0 PERMA HAND 18 BK (SUTURE) IMPLANT
SYR 3ML LL SCALE MARK (SYRINGE) ×2 IMPLANT
TOWEL GREEN STERILE FF (TOWEL DISPOSABLE) ×4 IMPLANT
TUBE CONNECTING 12X1/4 (SUCTIONS) ×2 IMPLANT

## 2024-10-12 NOTE — Anesthesia Procedure Notes (Signed)
 Procedure Name: Intubation Date/Time: 10/12/2024 3:06 PM  Performed by: Mannie Krystal LABOR, CRNAPre-anesthesia Checklist: Patient identified, Emergency Drugs available, Suction available and Patient being monitored Patient Re-evaluated:Patient Re-evaluated prior to induction Oxygen Delivery Method: Circle system utilized Preoxygenation: Pre-oxygenation with 100% oxygen Induction Type: IV induction Ventilation: Mask ventilation without difficulty Laryngoscope Size: Mac and 3 Grade View: Grade I Tube type: MLT Tube size: 6.0 mm Number of attempts: 1 Airway Equipment and Method: Stylet Placement Confirmation: ETT inserted through vocal cords under direct vision, positive ETCO2 and breath sounds checked- equal and bilateral Secured at: 22 cm Tube secured with: Tape Dental Injury: Teeth and Oropharynx as per pre-operative assessment
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
## 2024-10-12 NOTE — Op Note (Signed)
 Otolaryngology Operative note  [name] Date/Time of Admission: 10/12/2024 11:11 AM  CSN: [id number];MRN:[id number]  DOB: 05-May-1966 Age: 58 y.o. Location: [facility] OR    Pre-Op Diagnosis: Left vocal fold polyp Dysphonia  Post-Op Diagnosis: Polyp of vocal cord  Procedure: Suspension micro direct laryngoscopy with micro flap excision of left vocal fold lesion using operating microscope - CPT 31545 Kenalog  injection into left vocal fold lesion using operating microscope - CPT 68428-48  Surgeon: Hadassah Parody, MD  Anesthesia type:  General  Anesthesiologist: Anesthesiologist: Boone Fess, MD CRNA: Mannie Krystal LABOR, CRNA   Staff: Circulator: Ashley Alm PARAS, RN; de Meribeth, Oneil MATSU, RN Scrub Person: Rosann Barter; Tomas Krabbe, RN; Arlee Peggyann HERO, RN  Implants: * No implants in log *  Specimens: ID Type Source Tests Collected by Time Destination  1 : Left vocal fold lesion Tissue Soft Tissue, Other SURGICAL PATHOLOGY Khamryn Calderone, Hadassah BROCKS, MD 10/12/2024 1534     EBL: minimal   Drains: none  Post-op disposition and condition: PACU, hemodynamically stable   Findings: - Polyp at the mid-posterior true vocal fold on the left, removed with micro flap dissection technique under operating microscope - Underlying vocal ligament was not injured  - Kenalog  injected at completion of the case   Complications: None apparent  Indications and consent:  [name] is a 59 y.o. female with diagnoses above. The patient's options were discussed, including risks/benefits/alternatives for each option. Patient expressed understanding, and despite these risks, consented and decided to proceed with above procedures. Informed consent was signed before proceeding.   Procedure: The patient was brought to the operating room. Intubation was performed by the anesthesia team using a 6-0 MLT tube.    The head of the bed was then turned 90 degrees. A pre-procedure time-out was  performed. A tooth guard was inserted to protect the maxillary dentition.    Exposure was achieved using a Dedo laryngoscope. This was placed into suspension. The left vocal fold polyp was identified.    The operating microscope was then brought into the field.  An epinephrine /saline mixture (1cc of 1mg /ml epinephrine  mixed with 4 cc of sterile saline) was injected into the left vocal fold to allow for hydrodissection and better visibility of the lesion during removal. The sickle knife was then used to make incision through the mucosa overlying the polyp. The polyp was then gently grasped and rolled medially. Careful dissection was performed to gently dissect the polyp away from its underlying mucosal attachments. Micro scissors were then used assist with removal. The specimen was then passed off the field. The overlying mucosal flap was then allowed to rest back into its original position following removal.   Kenalog  was then injected into the wound with a butterfly needle to assist with healing and prevent scar formation.   Epinephrine  soaked pledgets were used for hemostasis.    Photo documentation was obtained using the 0-degree operating telescope.   2% lidocaine  was sprayed on the cords.    The patient was then taken out of suspension and the laryngoscope removed, suctioning the pharynx on the way out.    Care of the patient was then transferred to anesthesia. The patient was awakened extubated and transferred to PACU in stable condition.

## 2024-10-12 NOTE — Transfer of Care (Signed)
 Immediate Anesthesia Transfer of Care Note  Patient: [name]  Procedure(s) Performed: LARYNGOSCOPY, DIRECT (Left) MICROLARYNGOSCOPY, WITH VOCAL CORD INJECTION  Patient Location: PACU  Anesthesia Type:General  Level of Consciousness: drowsy  Airway & Oxygen Therapy: Patient Spontanous Breathing and Patient connected to face mask oxygen  Post-op Assessment: Report given to RN and Post -op Vital signs reviewed and stable  Post vital signs: Reviewed and stable  Last Vitals:  Vitals Value Taken Time  BP 117/76 10/12/24 16:03  Temp 36.7 C 10/12/24 16:03  Pulse 68 10/12/24 16:12  Resp 18 10/12/24 16:12  SpO2 100 % 10/12/24 16:12  Vitals shown include unfiled device data.  Last Pain:  Vitals:   10/12/24 1603  TempSrc:   PainSc: Asleep      Patients Stated Pain Goal: 3 (10/12/24 1153)  Complications: No notable events documented.

## 2024-10-12 NOTE — Discharge Instructions (Addendum)
 Post Anesthesia Guidelines  During recovery from anesthesia  You may feel drowsy and reflexes may be slowed for 24 hours:  -Do not drive, use machinery, appliances, ride bicycles or scooters  -Do not consume alcohol  -Do not make important decisions   Eating and drinking:  -Drink plenty of liquids today  -Avoid fried or spicy foods today  -Return to your regular diet slowly over the next 24 hours  -Please call if unable to keep fluids down  If your throat is sore: -A breathing tube may have been used during your surgery -Drink cool liquids or gargle with warm salt water -If soreness lasts more than a few days, please call us   Surgery Discharge Instructions:  Call clinic or return to ED if you: - develop a fever greater than 101.4 - have shaking chills or are feeling ill - become short of breath - have uncontrollable nausea or vomiting - can't hold down food or liquids or feel as though you are getting dehydrated - have leakage or drainage from wound - urine output of less than 30cc/hr for 12 hours - develop redness, pain at incision(s) or wound opens up/separates - any other acute events, problems, or concerns  Wound Care/Dressings/Drain Instructions:  - none  Medications: - Resume your regular home medications except as detailed in the medication reconciliation.  - For pain, take tylenol  650mg  every 6 hours alternating with Ibuprofen  600mg  every 6 hours. If that is not sufficient, a stronger pain medication has been prescribed to you. Do not mix with any other narcotic medication.  Follow Up:  - A follow up appointment should be scheduled for you after discharge, and our secretaries will be contacting you with the details of that appointment. However, If you do not hear about your appoinment in 3 business days, please call the provided surgery clinic number and confirm/schedule your appointment.    Activity/Restrictions:  - Resume your regular activities, as tolerated.   - Refrain from voice use for 3 days  - Ok for normal diet  Diet: - Resume your regular diet, as tolerated  Additional Instructions: - Please take an over the counter stool softener while taking narcotic pain medication - DO NOT MIX NARCOTIC PAIN MEDICATIONS OR TAKE NARCOTIC PRESCRIPTIONS AT THE SAME TIME - DO NOT DRIVE OR OPERATE HEAVY MACHINERY WHILE ON NARCOTICS  - DO NOT TAKE MORE THAN 4 GRAMS (4000mg ) OF TYLENOL  (ACETAMINOPHEN ) IN 24 HOURS
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
## 2024-10-13 ENCOUNTER — Encounter (HOSPITAL_COMMUNITY): Payer: Self-pay

## 2024-10-13 NOTE — Anesthesia Postprocedure Evaluation (Signed)
 Anesthesia Post Note  Patient: [name]  Procedure(s) Performed: LARYNGOSCOPY, DIRECT (Left) MICROLARYNGOSCOPY, WITH VOCAL CORD INJECTION     Patient location during evaluation: PACU Anesthesia Type: General Level of consciousness: awake and alert Pain management: pain level controlled Vital Signs Assessment: post-procedure vital signs reviewed and stable Respiratory status: spontaneous breathing, nonlabored ventilation, respiratory function stable and patient connected to nasal cannula oxygen Cardiovascular status: blood pressure returned to baseline and stable Postop Assessment: no apparent nausea or vomiting Anesthetic complications: no   No notable events documented.  Last Vitals:  Vitals:   10/12/24 1615 10/12/24 1630  BP: 116/76 120/83  Pulse: 67 77  Resp: 17 20  Temp:    SpO2: 100% 94%    Last Pain:  Vitals:   10/12/24 1645  TempSrc:   PainSc: 7                  Rome Ade

## 2024-10-14 ENCOUNTER — Ambulatory Visit: Payer: Self-pay | Admitting: Family Medicine

## 2024-10-14 LAB — SURGICAL PATHOLOGY

## 2024-10-14 NOTE — Telephone Encounter (Signed)
 Dr. Joane, just to confirm, are we referring to Dr. Darlis?

## 2024-10-14 NOTE — Telephone Encounter (Signed)
 Form reviewed and signed by Dr. Denyse Amass, placed at the front desk for faxing/scanning.

## 2024-10-14 NOTE — Telephone Encounter (Signed)
Form completed and placed on Dr. Corey's desk to review and sign.  

## 2024-10-14 NOTE — Progress Notes (Signed)
 Hip x-ray does show some arthritis of both hips but otherwise looks okay.

## 2024-10-15 NOTE — Telephone Encounter (Signed)
 Referral placed to Dr. Darlis at [facility].

## 2024-10-20 ENCOUNTER — Telehealth: Payer: Self-pay

## 2024-10-20 NOTE — Telephone Encounter (Signed)
 Spoke with pt regarding forms. She notes that she was contacted by Dr. Sunnie office and told that they would need for her to see one of their neurosurgeon because scheduling with Dr. Darlis for pain management. Pt is already scheduled with Neurosurgery, Dr. Clois. Prefers to stay within [facility] if possible.   Dr. Joane, it there a provider within [facility] that pt can be referred to that does the same thing as Dr. Darlis?

## 2024-10-20 NOTE — Telephone Encounter (Signed)
 FMLA form received Due 11/02/34

## 2024-10-21 NOTE — Telephone Encounter (Signed)
 Form completed, placed on Dr. Zollie Pee desk to review and sign.

## 2024-10-21 NOTE — Telephone Encounter (Signed)
 Faxed ppwk successfully to [phone], sent to scan.

## 2024-10-25 ENCOUNTER — Ambulatory Visit (INDEPENDENT_AMBULATORY_CARE_PROVIDER_SITE_OTHER)

## 2024-10-25 ENCOUNTER — Encounter (INDEPENDENT_AMBULATORY_CARE_PROVIDER_SITE_OTHER): Payer: Self-pay

## 2024-10-25 VITALS — BP 144/93 | HR 62

## 2024-10-25 DIAGNOSIS — Z86018 Personal history of other benign neoplasm: Secondary | ICD-10-CM | POA: Diagnosis not present

## 2024-10-25 DIAGNOSIS — R490 Dysphonia: Secondary | ICD-10-CM

## 2024-10-25 DIAGNOSIS — Z09 Encounter for follow-up examination after completed treatment for conditions other than malignant neoplasm: Secondary | ICD-10-CM | POA: Diagnosis not present

## 2024-10-25 DIAGNOSIS — J381 Polyp of vocal cord and larynx: Secondary | ICD-10-CM

## 2024-10-25 DIAGNOSIS — R070 Pain in throat: Secondary | ICD-10-CM

## 2024-10-25 DIAGNOSIS — G8918 Other acute postprocedural pain: Secondary | ICD-10-CM

## 2024-10-26 ENCOUNTER — Ambulatory Visit: Admitting: Neurosurgery

## 2024-10-26 DIAGNOSIS — F4320 Adjustment disorder, unspecified: Secondary | ICD-10-CM | POA: Diagnosis not present

## 2024-10-27 NOTE — Telephone Encounter (Signed)
 Spoke with pt regarding forms. She notes that she was contacted by Dr. Sunnie office and told that they would need for her to see one of their neurosurgeon because scheduling with Dr. Darlis for pain management. Pt is already scheduled with Neurosurgery, Dr. Clois. Prefers to stay within [facility] if possible.   Dr. Joane, it there a provider within [facility] that pt can be referred to that does the same thing as Dr. Darlis?
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
## 2024-10-27 NOTE — Telephone Encounter (Signed)
 Yes okay to refer to [facility]

## 2024-10-29 NOTE — Discharge Instructions (Signed)

## 2024-11-01 ENCOUNTER — Ambulatory Visit
Admission: RE | Admit: 2024-11-01 | Discharge: 2024-11-01 | Disposition: A | Source: Ambulatory Visit | Attending: Family Medicine | Admitting: Family Medicine

## 2024-11-01 DIAGNOSIS — M5416 Radiculopathy, lumbar region: Secondary | ICD-10-CM | POA: Diagnosis not present

## 2024-11-01 MED ORDER — IOPAMIDOL (ISOVUE-M 200) INJECTION 41%
1.0000 mL | Freq: Once | INTRAMUSCULAR | Status: AC
  Administered 2024-11-01: 1 mL via EPIDURAL

## 2024-11-01 MED ORDER — METHYLPREDNISOLONE ACETATE 40 MG/ML INJ SUSP (RADIOLOG
80.0000 mg | Freq: Once | INTRAMUSCULAR | Status: AC
  Administered 2024-11-01: 80 mg via EPIDURAL
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
## 2024-11-02 ENCOUNTER — Other Ambulatory Visit: Payer: Self-pay

## 2024-11-02 ENCOUNTER — Encounter: Payer: Self-pay | Admitting: Allergy and Immunology

## 2024-11-02 ENCOUNTER — Other Ambulatory Visit (HOSPITAL_COMMUNITY): Payer: Self-pay

## 2024-11-02 ENCOUNTER — Ambulatory Visit (INDEPENDENT_AMBULATORY_CARE_PROVIDER_SITE_OTHER): Admitting: Allergy and Immunology

## 2024-11-02 VITALS — BP 128/90 | HR 74 | Temp 98.5°F | Resp 16 | Ht 60.0 in | Wt 155.9 lb

## 2024-11-02 DIAGNOSIS — K219 Gastro-esophageal reflux disease without esophagitis: Secondary | ICD-10-CM

## 2024-11-02 DIAGNOSIS — J301 Allergic rhinitis due to pollen: Secondary | ICD-10-CM

## 2024-11-02 DIAGNOSIS — J3089 Other allergic rhinitis: Secondary | ICD-10-CM

## 2024-11-02 DIAGNOSIS — J4550 Severe persistent asthma, uncomplicated: Secondary | ICD-10-CM | POA: Diagnosis not present

## 2024-11-02 MED ORDER — FAMOTIDINE 40 MG PO TABS
40.0000 mg | ORAL_TABLET | Freq: Every evening | ORAL | 1 refills | Status: AC
  Filled 2024-11-02: qty 90, 90d supply, fill #0

## 2024-11-02 MED ORDER — FLUTICASONE PROPIONATE 50 MCG/ACT NA SUSP
1.0000 | Freq: Two times a day (BID) | NASAL | 1 refills | Status: AC
  Filled 2024-11-02: qty 48, 30d supply, fill #0

## 2024-11-02 MED ORDER — AIRSUPRA 90-80 MCG/ACT IN AERO
2.0000 | INHALATION_SPRAY | RESPIRATORY_TRACT | 1 refills | Status: AC | PRN
  Filled 2024-11-02: qty 10.7, 30d supply, fill #0

## 2024-11-02 MED ORDER — ESOMEPRAZOLE MAGNESIUM 40 MG PO CPDR
40.0000 mg | DELAYED_RELEASE_CAPSULE | Freq: Two times a day (BID) | ORAL | 1 refills | Status: AC
  Filled 2024-11-02: qty 180, 90d supply, fill #0

## 2024-11-02 MED ORDER — CETIRIZINE HCL 10 MG PO TABS
10.0000 mg | ORAL_TABLET | Freq: Every day | ORAL | 1 refills | Status: AC | PRN
  Filled 2024-11-02 (×2): qty 180, 180d supply, fill #0

## 2024-11-02 MED ORDER — BUDESONIDE-FORMOTEROL FUMARATE 160-4.5 MCG/ACT IN AERO
2.0000 | INHALATION_SPRAY | Freq: Two times a day (BID) | RESPIRATORY_TRACT | 12 refills | Status: AC
  Filled 2024-11-02: qty 1, fill #0
  Filled 2024-11-02: qty 10.2, 30d supply, fill #0

## 2024-11-02 MED ORDER — ALBUTEROL SULFATE (2.5 MG/3ML) 0.083% IN NEBU
2.5000 mg | INHALATION_SOLUTION | Freq: Four times a day (QID) | RESPIRATORY_TRACT | 12 refills | Status: AC | PRN
  Filled 2024-11-02: qty 150, 13d supply, fill #0

## 2024-11-02 NOTE — Patient Instructions (Addendum)
" °  1.  Allergen avoidance measures - dust mite, pollens  2.  Treat and prevent inflammation:  A. Symbicort  160 - 2 inhalations 1-2 times per day w/spacer  B. Flonase  - 1 spray each nostril 1-2 times per day  3.  Treat and prevent reflux / LPR:  A. Minimize caffeine consumption B. Nexium  40 mg - 1 tablet 2 times per day C. Famotidine  40 mg - 1 tablet in evening D. Replace all throat clearing with drinking/swallowing maneuver  4.  If needed:  A. AIRSUPRA  - 2 inhalations or nebulizer every 4-6 hours B. Cetirizine  10 mg - 1 tablet 1 time per day  5.  Follow up in 6 months or sooner if needed  6. Influenza = Tamiflu. Covid = Paxlovid "
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
## 2024-11-03 ENCOUNTER — Encounter: Payer: Self-pay | Admitting: Allergy and Immunology

## 2024-11-03 ENCOUNTER — Telehealth: Payer: Self-pay

## 2024-11-03 ENCOUNTER — Ambulatory Visit: Admitting: Physical Therapy

## 2024-11-03 DIAGNOSIS — M5416 Radiculopathy, lumbar region: Secondary | ICD-10-CM

## 2024-11-03 DIAGNOSIS — R29898 Other symptoms and signs involving the musculoskeletal system: Secondary | ICD-10-CM

## 2024-11-03 NOTE — Telephone Encounter (Signed)
"  °  Jordan Artist RAMAN, MD  Physician Specialty: Sports Medicine   Telephone Encounter Signed   Creation Time: 10/27/2024 11:38 AM   Signed     Yes okay to refer to [facility]            Addend   Delete   Tag   Copy   Mardy Leotis Decker, CMA Certified Medical Assistant    Telephone Encounter Signed   Creation Time: 10/27/2024  8:57 AM       Signed     Spoke with pt regarding forms. She notes that she was contacted by Dr. Sunnie office and told that they would need for her to see one of their neurosurgeon because scheduling with Dr. Darlis for pain management. Pt is already scheduled with Neurosurgery, Dr. Clois. Prefers to stay within [facility] if possible.    Dr. Joane, it there a provider within [facility] that pt can be referred to that does the same thing as Dr. Darlis?            "

## 2024-11-05 NOTE — Telephone Encounter (Signed)
 Forwarding to Dr. Denyse Amass to review and advise.

## 2024-11-05 NOTE — Telephone Encounter (Signed)
 You are seeing in a neurosurgeon next week to talk about surgical options.  Dr. Eldonna can do the injections.  Dr. Katrina can do surgery but not injections.  I think keeping both appointments is a good idea.

## 2024-11-08 ENCOUNTER — Other Ambulatory Visit (HOSPITAL_COMMUNITY): Payer: Self-pay

## 2024-11-08 ENCOUNTER — Ambulatory Visit: Admitting: Family Medicine

## 2024-11-08 VITALS — BP 138/80 | HR 67 | Ht 60.0 in | Wt 157.2 lb

## 2024-11-08 DIAGNOSIS — M5416 Radiculopathy, lumbar region: Secondary | ICD-10-CM | POA: Diagnosis not present

## 2024-11-08 MED ORDER — OXYCODONE HCL 5 MG PO TABS
5.0000 mg | ORAL_TABLET | Freq: Four times a day (QID) | ORAL | 0 refills | Status: AC | PRN
  Filled 2024-11-08: qty 15, 4d supply, fill #0

## 2024-11-08 MED ORDER — GABAPENTIN 300 MG PO CAPS
300.0000 mg | ORAL_CAPSULE | Freq: Three times a day (TID) | ORAL | 3 refills | Status: AC | PRN
  Filled 2024-11-08: qty 90, 30d supply, fill #0

## 2024-11-08 NOTE — Patient Instructions (Signed)
 Thank you for coming in today.   I prescribed oxycodone  for severe pain.  I also prescribe gabapentin  for nerve pain.   You can take both with tylenol  and ibuprofen  type medicines for pain.   Let me know how it goes with Neurosurgery tomorrow.

## 2024-11-08 NOTE — Progress Notes (Signed)
"       ° °  I, [name] am a scribe for Dr. Artist Lloyd, MD.  [name] is a 59 y.o. female who presents to [facility] at [facility] today for 83-month f/u lumbar radiculopathy. Pt was last seen by Dr. Lloyd on 10/08/24 and a repeat lumbar ESI was ordered, done on 12/29.  Today, pt reports that it is not as bad but it is hurting today. The injection on last Monday helped a little. It seemed to have helped more than the first time but not much improvement overall.  Pain is located in the left low back radiating down the left leg to the medial and anterior shin at times.  Pain is severe at times requiring opiates for pain control.  She is unable to work because of this pain.  Dx testing: 08/28/24 L-spine MRI                    08/08/24 L-spine & t-spine XR  Pertinent review of systems: No fevers or chills  Relevant historical information: Hypertension   Exam:  BP 138/80   Pulse 67   Ht 5' (1.524 m)   Wt 157 lb 3.2 oz (71.3 kg)   SpO2 98%   BMI 30.70 kg/m  General: Well Developed, well nourished, and in no acute distress.   MSK: L-spine decreased lumbar motion lower extremity strength is intact.  Positive slump test left side.    Lab and Radiology Results No results found for this or any previous visit (from the past 72 hours). No results found.     Assessment and Plan: 59 y.o. female with lumbar radiculopathy.  Unfortunately she is not able to improve with typical conservative management strategies.  She has a consultation scheduled with neurosurgery tomorrow which should be helpful.  I expect surgery will be recommended.  I did prescribe oxycodone  and gabapentin  for pain control now and used in addition to Tylenol  and ibuprofen .  Her work note is scheduled to return to work on the 16th.  Obviously this will need to be revised as she cannot work.  Anticipate surgical plan with change in work status as a result of that.  Check back with me as needed.   PDMP  not reviewed this encounter. No orders of the defined types were placed in this encounter.  Meds ordered this encounter  Medications   gabapentin  (NEURONTIN ) 300 MG capsule    Sig: Take 1 capsule (300 mg total) by mouth 3 (three) times daily as needed.    Dispense:  90 capsule    Refill:  3   oxyCODONE  (ROXICODONE ) 5 MG immediate release tablet    Sig: Take 1 tablet (5 mg total) by mouth every 6 (six) hours as needed for severe pain (pain score 7-10).    Dispense:  15 tablet    Refill:  0     Discussed warning signs or symptoms. Please see discharge instructions. Patient expresses understanding.   The above documentation has been reviewed and is accurate and complete Artist Lloyd, M.D.   "

## 2024-11-09 ENCOUNTER — Ambulatory Visit: Admitting: Neurosurgery

## 2024-11-09 ENCOUNTER — Encounter: Payer: Self-pay | Admitting: Neurology

## 2024-11-09 ENCOUNTER — Encounter: Payer: Self-pay | Admitting: Neurosurgery

## 2024-11-09 VITALS — BP 130/82 | Ht 60.0 in | Wt 153.5 lb

## 2024-11-09 DIAGNOSIS — M4316 Spondylolisthesis, lumbar region: Secondary | ICD-10-CM

## 2024-11-09 DIAGNOSIS — M532X6 Spinal instabilities, lumbar region: Secondary | ICD-10-CM | POA: Diagnosis not present

## 2024-11-09 DIAGNOSIS — M5442 Lumbago with sciatica, left side: Secondary | ICD-10-CM | POA: Diagnosis not present

## 2024-11-09 DIAGNOSIS — G8929 Other chronic pain: Secondary | ICD-10-CM | POA: Diagnosis not present

## 2024-11-09 DIAGNOSIS — E2839 Other primary ovarian failure: Secondary | ICD-10-CM

## 2024-11-12 ENCOUNTER — Other Ambulatory Visit: Payer: Self-pay

## 2024-11-12 DIAGNOSIS — R202 Paresthesia of skin: Secondary | ICD-10-CM

## 2024-11-15 ENCOUNTER — Telehealth: Payer: Self-pay | Admitting: Family Medicine

## 2024-11-15 NOTE — Telephone Encounter (Signed)
 Patient called regarding her time out of work and would like Brandy to call her. She states it would be easier to communicate by phone call rather than messaging on mychart.

## 2024-11-16 ENCOUNTER — Ambulatory Visit (HOSPITAL_BASED_OUTPATIENT_CLINIC_OR_DEPARTMENT_OTHER)
Admission: RE | Admit: 2024-11-16 | Discharge: 2024-11-16 | Disposition: A | Discharge status: Home / Self Care | Source: Ambulatory Visit | Attending: Neurosurgery | Admitting: Neurosurgery

## 2024-11-16 DIAGNOSIS — E2839 Other primary ovarian failure: Secondary | ICD-10-CM | POA: Diagnosis present

## 2024-11-16 NOTE — Telephone Encounter (Signed)
Called pt, left VM to call the office.  

## 2024-11-16 NOTE — Telephone Encounter (Signed)
 Called and spoke with patient. She is currently unable to return to work. She will reach out to [employer] to let them know that leave will be extended.   Pt scheduled to see Dr Genelle on 12/28/24. Scheduled f/u visit with Dr. Joane 12/30/24 to review procedures/scan, visit with [facility], and anticipated RTW date.   Will plan to RTW on 01/03/25. Waiting on FMLA forms.

## 2024-11-17 ENCOUNTER — Other Ambulatory Visit: Payer: Self-pay | Admitting: Internal Medicine

## 2024-11-17 ENCOUNTER — Encounter: Payer: Self-pay | Admitting: Physical Therapy

## 2024-11-17 ENCOUNTER — Ambulatory Visit: Admitting: Physical Therapy

## 2024-11-17 ENCOUNTER — Ambulatory Visit: Attending: Internal Medicine | Admitting: Physical Therapy

## 2024-11-17 DIAGNOSIS — M6281 Muscle weakness (generalized): Secondary | ICD-10-CM | POA: Insufficient documentation

## 2024-11-17 DIAGNOSIS — R262 Difficulty in walking, not elsewhere classified: Secondary | ICD-10-CM | POA: Diagnosis present

## 2024-11-17 DIAGNOSIS — M546 Pain in thoracic spine: Secondary | ICD-10-CM | POA: Diagnosis present

## 2024-11-17 DIAGNOSIS — M5459 Other low back pain: Secondary | ICD-10-CM | POA: Diagnosis present

## 2024-11-17 DIAGNOSIS — M542 Cervicalgia: Secondary | ICD-10-CM | POA: Diagnosis present

## 2024-11-17 DIAGNOSIS — R293 Abnormal posture: Secondary | ICD-10-CM | POA: Diagnosis present

## 2024-11-17 DIAGNOSIS — R252 Cramp and spasm: Secondary | ICD-10-CM | POA: Diagnosis present
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
## 2024-11-18 ENCOUNTER — Other Ambulatory Visit (HOSPITAL_COMMUNITY): Payer: Self-pay

## 2024-11-18 NOTE — Addendum Note (Signed)
 Addended by: MARDY LEOTIS RAMAN on: 11/18/2024 03:34 PM   Modules accepted: Orders

## 2024-11-21 ENCOUNTER — Encounter: Payer: Self-pay | Admitting: Neurosurgery

## 2024-11-22 ENCOUNTER — Encounter (INDEPENDENT_AMBULATORY_CARE_PROVIDER_SITE_OTHER): Payer: Self-pay

## 2024-11-22 ENCOUNTER — Ambulatory Visit (INDEPENDENT_AMBULATORY_CARE_PROVIDER_SITE_OTHER)

## 2024-11-22 VITALS — BP 145/89 | HR 58 | Temp 97.5°F | Ht 60.0 in | Wt 153.5 lb

## 2024-11-22 DIAGNOSIS — R490 Dysphonia: Secondary | ICD-10-CM

## 2024-11-22 DIAGNOSIS — J381 Polyp of vocal cord and larynx: Secondary | ICD-10-CM | POA: Diagnosis not present
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
## 2024-11-24 ENCOUNTER — Ambulatory Visit: Admitting: Physical Therapy

## 2024-11-26 ENCOUNTER — Telehealth: Payer: Self-pay

## 2024-11-26 NOTE — Telephone Encounter (Signed)
 [facility]  Received 11/26/24 Due 11/30/24

## 2024-11-30 NOTE — Telephone Encounter (Signed)
 Patient called after hours and left a message for Brandy to call her I regards to forms needed

## 2024-11-30 NOTE — Telephone Encounter (Signed)
 Called and spoke with patient. I advised that forms have been completed and placed at the front desk for faxing.

## 2024-11-30 NOTE — Addendum Note (Signed)
 Addended by: MARDY LEOTIS RAMAN on: 11/30/2024 10:26 AM   Modules accepted: Orders

## 2024-11-30 NOTE — Telephone Encounter (Signed)
 Form completed, reviewed and signed by Dr. Alease Hunter, placed at the front desk for faxing/scanning.

## 2024-12-01 ENCOUNTER — Ambulatory Visit: Admitting: Physical Therapy

## 2024-12-02 NOTE — Telephone Encounter (Signed)
 Spoke with pt, [facility] needs most recent office visit note.   Visit note 11/08/24 faxed.

## 2024-12-02 NOTE — Telephone Encounter (Signed)
 Pt scheduled for NCV/EMG - check for results on Monday.

## 2024-12-02 NOTE — Telephone Encounter (Signed)
 Please call patient ASAP. She said that she talked to the [facility] today and needs to discuss something with you.

## 2024-12-03 ENCOUNTER — Ambulatory Visit (INDEPENDENT_AMBULATORY_CARE_PROVIDER_SITE_OTHER): Admitting: Neurology

## 2024-12-03 DIAGNOSIS — R202 Paresthesia of skin: Secondary | ICD-10-CM

## 2024-12-03 NOTE — Procedures (Signed)
" °  [facility]  [address] [place], [address]  [place], [place] [address] Tel: [phone] Fax: [phone] Test Date:  12/03/2024  Patient: [name] DOB: [birth year] Physician: Tonita Blanch, DO  Sex: Female Height: 5' 0 Ref Phys: Reeves Daisy, MD  ID#: [id number]   Technician:    History: This is a 59 year old female referred for evaluation of diffuse left leg pain.  NCV & EMG Findings: Extensive electrodiagnostic testing of the left lower extremity shows:  Left sural and superficial peroneal sensory responses are within normal limits. Left peroneal and tibial motor responses are within normal limits. Left tibial H-reflex studies within normal limits. Needle electrode examination was prematurely terminated due to pain.  Of the limited muscles tested, there is no evidence of active or chronic motor axon loss changes; notably, there is a global pattern of incomplete motor unit recruitment due to pain and/or poor effort.  Impression: This is an incomplete study. This study is unable to assess for lumbosacral radiculopathy as needle electrode examination was terminated due to pain.  There is no evidence of a large fiber sensorimotor polyneuropathy affecting the left lower extremity.     ___________________________ Tonita Blanch, DO    Nerve Conduction Studies   Stim Site NR Peak (ms) Norm Peak (ms) O-P Amp (V) Norm O-P Amp  Left Sup Peroneal Anti Sensory (Ant Lat Mall)  32 C  12 cm    1.9 <4.6 10.6 >4  Left Sural Anti Sensory (Lat Mall)  32 C  Calf    2.3 <4.6 13.0 >4     Stim Site NR Onset (ms) Norm Onset (ms) O-P Amp (mV) Norm O-P Amp Site1 Site2 Delta-0 (ms) Dist (cm) Vel (m/s) Norm Vel (m/s)  Left Peroneal Motor (Ext Dig Brev)  32 C  Ankle    2.5 <6.0 7.5 >2.5 B Fib Ankle 6.3 32.0 51 >40  B Fib    8.8  7.0  Poplt B Fib 1.4 8.0 57 >40  Poplt    10.2  7.1         Left Tibial Motor (Abd Hall Brev)  32 C  Ankle    2.4 <6.0 4.5 >4 Knee Ankle 7.4 36.0  49 >40  Knee    9.8  4.9          Electromyography   Side Muscle Ins.Act Fibs Fasc Recrt Amp Dur Poly Activation Comment  Left AntTibialis Nml Nml Nml *1- Nml Nml Nml *Variable N/A  Left Gastroc Nml Nml Nml *1- Nml Nml Nml *Variable N/A  Left Flex Dig Long Nml Nml Nml *1- Nml Nml Nml *Variable N/A      Waveforms:             "

## 2024-12-06 ENCOUNTER — Ambulatory Visit

## 2024-12-06 ENCOUNTER — Encounter: Payer: Self-pay | Admitting: Neurology

## 2024-12-07 ENCOUNTER — Encounter: Payer: Self-pay | Admitting: Neurosurgery

## 2024-12-08 ENCOUNTER — Telehealth: Payer: Self-pay | Admitting: Family Medicine

## 2024-12-08 NOTE — Telephone Encounter (Addendum)
 Patient called and stated the [facility] said they need another ADA document. She does not know if you have one or if she needs to send one. The deadline is the 11th of February.  Please advise.

## 2024-12-09 NOTE — Telephone Encounter (Signed)
 [name] E routed this conversation to Me  Gerome, Ileana GORMAN [name] FORBES BRIDGETT   12/08/24  4:04 PM Note Patient called and stated [facility] said they need another ADA document. She does not know if you have one or if she needs to send one. The deadline is the 11th of February.  Please advise.

## 2024-12-10 NOTE — Telephone Encounter (Signed)
 Form completed, placed on Dr. Zollie Pee desk to review and sign.

## 2024-12-10 NOTE — Telephone Encounter (Signed)
 Patient called and stated that she emailed the ADA documents to [place] and she wanted to give her a heads up. Patient says to call her if you need to. FYI.

## 2024-12-14 ENCOUNTER — Ambulatory Visit: Admitting: Neurosurgery

## 2024-12-15 ENCOUNTER — Ambulatory Visit

## 2024-12-28 ENCOUNTER — Ambulatory Visit: Admitting: Neurosurgery

## 2024-12-30 ENCOUNTER — Ambulatory Visit: Admitting: Family Medicine

## 2025-05-03 ENCOUNTER — Ambulatory Visit: Admitting: Allergy and Immunology
